# Patient Record
Sex: Female | Born: 1960 | Race: White | Hispanic: No | Marital: Married | Smoking: Never smoker
Health system: Southern US, Community
[De-identification: ages and names within clinical notes are randomized; demographics above are authoritative.]

## PROBLEM LIST (undated history)

## (undated) DIAGNOSIS — C4490 Unspecified malignant neoplasm of skin, unspecified: Secondary | ICD-10-CM

## (undated) DIAGNOSIS — Z87442 Personal history of urinary calculi: Secondary | ICD-10-CM

## (undated) DIAGNOSIS — B1910 Unspecified viral hepatitis B without hepatic coma: Secondary | ICD-10-CM

## (undated) DIAGNOSIS — G5603 Carpal tunnel syndrome, bilateral upper limbs: Secondary | ICD-10-CM

## (undated) DIAGNOSIS — E7800 Pure hypercholesterolemia, unspecified: Secondary | ICD-10-CM

## (undated) DIAGNOSIS — E042 Nontoxic multinodular goiter: Secondary | ICD-10-CM

## (undated) DIAGNOSIS — K219 Gastro-esophageal reflux disease without esophagitis: Secondary | ICD-10-CM

## (undated) DIAGNOSIS — I10 Essential (primary) hypertension: Secondary | ICD-10-CM

## (undated) DIAGNOSIS — K760 Fatty (change of) liver, not elsewhere classified: Secondary | ICD-10-CM

## (undated) DIAGNOSIS — E039 Hypothyroidism, unspecified: Secondary | ICD-10-CM

## (undated) DIAGNOSIS — R7303 Prediabetes: Secondary | ICD-10-CM

## (undated) DIAGNOSIS — Z9889 Other specified postprocedural states: Secondary | ICD-10-CM

## (undated) DIAGNOSIS — R112 Nausea with vomiting, unspecified: Secondary | ICD-10-CM

## (undated) HISTORY — PX: CARPAL TUNNEL RELEASE: SHX101

## (undated) HISTORY — PX: KNEE SURGERY: SHX244

## (undated) HISTORY — PX: TONSILLECTOMY: SUR1361

## (undated) HISTORY — PX: ABDOMINAL HYSTERECTOMY: SHX81

## (undated) HISTORY — PX: TUBAL LIGATION: SHX77

---

## 1997-12-06 ENCOUNTER — Ambulatory Visit (HOSPITAL_COMMUNITY): Admission: RE | Admit: 1997-12-06 | Discharge: 1997-12-06 | Payer: Self-pay | Admitting: *Deleted

## 1999-02-26 ENCOUNTER — Other Ambulatory Visit: Admission: RE | Admit: 1999-02-26 | Discharge: 1999-02-26 | Payer: Self-pay | Admitting: *Deleted

## 2000-06-30 ENCOUNTER — Other Ambulatory Visit: Admission: RE | Admit: 2000-06-30 | Discharge: 2000-06-30 | Payer: Self-pay | Admitting: *Deleted

## 2001-10-24 ENCOUNTER — Other Ambulatory Visit: Admission: RE | Admit: 2001-10-24 | Discharge: 2001-10-24 | Payer: Self-pay | Admitting: *Deleted

## 2002-11-01 ENCOUNTER — Other Ambulatory Visit: Admission: RE | Admit: 2002-11-01 | Discharge: 2002-11-01 | Payer: Self-pay | Admitting: *Deleted

## 2003-04-15 ENCOUNTER — Encounter: Admission: RE | Admit: 2003-04-15 | Discharge: 2003-04-15 | Payer: Self-pay | Admitting: Internal Medicine

## 2003-04-19 ENCOUNTER — Ambulatory Visit (HOSPITAL_COMMUNITY): Admission: RE | Admit: 2003-04-19 | Discharge: 2003-04-19 | Payer: Self-pay | Admitting: Internal Medicine

## 2004-01-09 ENCOUNTER — Other Ambulatory Visit: Admission: RE | Admit: 2004-01-09 | Discharge: 2004-01-09 | Payer: Self-pay | Admitting: *Deleted

## 2004-02-18 ENCOUNTER — Observation Stay (HOSPITAL_COMMUNITY): Admission: RE | Admit: 2004-02-18 | Discharge: 2004-02-19 | Payer: Self-pay | Admitting: *Deleted

## 2004-02-18 ENCOUNTER — Encounter (INDEPENDENT_AMBULATORY_CARE_PROVIDER_SITE_OTHER): Payer: Self-pay | Admitting: *Deleted

## 2005-01-28 ENCOUNTER — Other Ambulatory Visit: Admission: RE | Admit: 2005-01-28 | Discharge: 2005-01-28 | Payer: Self-pay | Admitting: *Deleted

## 2005-09-15 ENCOUNTER — Encounter: Admission: RE | Admit: 2005-09-15 | Discharge: 2005-09-15 | Payer: Self-pay | Admitting: Occupational Medicine

## 2006-05-04 ENCOUNTER — Other Ambulatory Visit: Admission: RE | Admit: 2006-05-04 | Discharge: 2006-05-04 | Payer: Self-pay | Admitting: *Deleted

## 2006-08-03 ENCOUNTER — Ambulatory Visit (HOSPITAL_BASED_OUTPATIENT_CLINIC_OR_DEPARTMENT_OTHER): Admission: RE | Admit: 2006-08-03 | Discharge: 2006-08-03 | Payer: Self-pay | Admitting: *Deleted

## 2006-08-17 ENCOUNTER — Ambulatory Visit (HOSPITAL_BASED_OUTPATIENT_CLINIC_OR_DEPARTMENT_OTHER): Admission: RE | Admit: 2006-08-17 | Discharge: 2006-08-17 | Payer: Self-pay | Admitting: *Deleted

## 2006-09-21 ENCOUNTER — Encounter (HOSPITAL_COMMUNITY): Admission: RE | Admit: 2006-09-21 | Discharge: 2006-10-21 | Payer: Self-pay | Admitting: *Deleted

## 2007-06-15 ENCOUNTER — Emergency Department (HOSPITAL_COMMUNITY): Admission: AC | Admit: 2007-06-15 | Discharge: 2007-06-15 | Payer: Self-pay

## 2007-12-25 ENCOUNTER — Other Ambulatory Visit: Admission: RE | Admit: 2007-12-25 | Discharge: 2007-12-25 | Payer: Self-pay | Admitting: Gynecology

## 2007-12-25 ENCOUNTER — Ambulatory Visit: Payer: Self-pay | Admitting: Women's Health

## 2007-12-25 ENCOUNTER — Encounter: Payer: Self-pay | Admitting: Women's Health

## 2008-01-24 ENCOUNTER — Ambulatory Visit (HOSPITAL_BASED_OUTPATIENT_CLINIC_OR_DEPARTMENT_OTHER): Admission: RE | Admit: 2008-01-24 | Discharge: 2008-01-24 | Payer: Self-pay | Admitting: *Deleted

## 2009-06-18 ENCOUNTER — Ambulatory Visit (HOSPITAL_COMMUNITY): Admission: RE | Admit: 2009-06-18 | Discharge: 2009-06-18 | Payer: Self-pay | Admitting: Orthopaedic Surgery

## 2009-07-01 ENCOUNTER — Ambulatory Visit (HOSPITAL_COMMUNITY): Admission: RE | Admit: 2009-07-01 | Discharge: 2009-07-01 | Payer: Self-pay | Admitting: Orthopaedic Surgery

## 2009-09-15 ENCOUNTER — Ambulatory Visit (HOSPITAL_COMMUNITY): Admission: RE | Admit: 2009-09-15 | Discharge: 2009-09-15 | Payer: Self-pay | Admitting: Orthopaedic Surgery

## 2009-10-03 ENCOUNTER — Ambulatory Visit (HOSPITAL_COMMUNITY): Admission: RE | Admit: 2009-10-03 | Discharge: 2009-10-03 | Payer: Self-pay | Admitting: Orthopaedic Surgery

## 2010-05-08 LAB — BASIC METABOLIC PANEL
BUN: 17 mg/dL (ref 6–23)
CO2: 27 mEq/L (ref 19–32)
Calcium: 9.5 mg/dL (ref 8.4–10.5)
Creatinine, Ser: 0.75 mg/dL (ref 0.4–1.2)
Glucose, Bld: 90 mg/dL (ref 70–99)
Sodium: 138 mEq/L (ref 135–145)

## 2010-05-08 LAB — SURGICAL PCR SCREEN: Staphylococcus aureus: NEGATIVE

## 2010-05-12 LAB — PROTIME-INR: INR: 0.96 (ref 0.00–1.49)

## 2010-06-12 ENCOUNTER — Institutional Professional Consult (permissible substitution): Payer: Self-pay | Admitting: Internal Medicine

## 2010-07-07 NOTE — Op Note (Signed)
NAME:  Norma Sanchez, Norma Sanchez NO.:  [id number]   MEDICAL RECORD NO.:  [id number]          PATIENT TYPE:  AMB   LOCATION:  DSC                          FACILITY:  [facility]   PHYSICIAN:  Tennis Must Meyerdierks, M.D.DATE OF BIRTH:  1960/03/25   DATE OF PROCEDURE:  01/24/2008  DATE OF DISCHARGE:                               OPERATIVE REPORT   PREOPERATIVE DIAGNOSIS:  Left trigger thumb.   POSTOPERATIVE DIAGNOSIS:  Left trigger thumb.   PROCEDURE:  Release of A1 pulley, left thumb.   SURGEON:  Lowell Bouton, MD.   ANESTHESIA:  Marcaine 0.5% local with sedation.   OPERATIVE FINDINGS:  The patient had thickening of the FPL tendon  beneath the A1 pulley.  There was some mild shredding of the tendon.   PROCEDURE:  Under 0.5% Marcaine local anesthesia with a tourniquet on  the left arm, the left hand was prepped and draped in usual fashion.  After exsanguinating the limb, the tourniquet was inflated to 250 mmHg.  A transverse incision was made over the volar aspect of the MP joint of  the left thumb and carried down through the subcutaneous tissues.  Blunt  dissection was carried down to the tendon sheath and the A1 pulley was  incised.  It was released completely with a scissors and there was some  shredding and thickening of the tendon beneath it.  After releasing the  pulley, the thumb had good range of motion without any further  triggering.  The wound was irrigated with saline.  The skin was closed  with 4-0 nylon sutures.  Sterile dressings were applied.  The tourniquet  was released with good circulation of the hand.  The patient went to the  recovery room, awake in stable in good condition.      Lowell Bouton, M.D.  Electronically Signed     EMM/MEDQ  D:  01/24/2008  T:  01/24/2008  Job:  [id number]

## 2010-07-07 NOTE — Op Note (Signed)
NAME:  Norma Sanchez, Norma Sanchez NO.:  [id number]   MEDICAL RECORD NO.:  [id number]          PATIENT TYPE:  AMB   LOCATION:  DSC                          FACILITY:  [facility]   PHYSICIAN:  Tennis Must Meyerdierks, M.D.DATE OF BIRTH:  10/28/1960   DATE OF PROCEDURE:  08/17/2006  DATE OF DISCHARGE:                               OPERATIVE REPORT   PREOPERATIVE DIAGNOSIS:  Left carpal tunnel syndrome.   POSTOPERATIVE DIAGNOSIS:  Left carpal tunnel syndrome.   PROCEDURE:  Decompression median nerve left carpal tunnel.   SURGEON:  Lowell Bouton, M.D.   ANESTHESIA:  0.5% Marcaine local with sedation.   OPERATIVE FINDINGS:  The patient had no masses in the carpal canal.  The  motor branch of the nerve was intact.  There did appear to be  significant compression on the median nerve.   DESCRIPTION OF PROCEDURE:  Under 0.5% Marcaine local anesthesia with a  tourniquet on the left arm, the left hand was prepped and draped in the  usual fashion. After exsanguinating the limb, the tourniquet was  inflated to 250 mmHg.  A 3 cm longitudinal incision was made in the palm  just ulnar to the thenar crease and carried through the subcutaneous  tissues.  Blunt dissection was carried through the superficial palmar  fascia distal to the transverse carpal ligament.  A hemostat was then  placed in the carpal canal up against the hook of hamate and the  transverse carpal ligament was divided on the ulnar border of the median  nerve.  The proximal end of the ligament was divided with the scissors  after dissecting the nerve away from the under surface of the ligament.  The carpal canal was then palpated and was found to be adequately  decompressed. The nerve was examined and the motor branch identified.  The wound was irrigated with saline.  The skin was closed with 4-0 nylon  sutures.  Sterile dressings were applied followed by a volar wrist  splint.  The sutures from the previous  right carpal tunnel release were  then removed prior to sending the patient to the recovery room awake and  stable in good condition.      Lowell Bouton, M.D.  Electronically Signed     EMM/MEDQ  D:  08/17/2006  T:  08/17/2006  Job:  [id number]

## 2010-07-07 NOTE — Op Note (Signed)
NAME:  Norma Sanchez, Norma Sanchez NO.:  [id number]   MEDICAL RECORD NO.:  [id number]          PATIENT TYPE:  AMB   LOCATION:  DSC                          FACILITY:  [facility]   PHYSICIAN:  Tennis Must Meyerdierks, M.D.DATE OF BIRTH:  1960/05/25   DATE OF PROCEDURE:  08/03/2006  DATE OF DISCHARGE:                               OPERATIVE REPORT   PREOPERATIVE DIAGNOSIS:  Right carpal tunnel syndrome.   POSTOPERATIVE DIAGNOSIS:  Right carpal tunnel syndrome.   PROCEDURE:  Decompression median nerve, right carpal tunnel.   SURGEON:  Lowell Bouton, M.D.   ANESTHESIA:  0.5% Marcaine with local with sedation.   OPERATIVE FINDINGS:  The patient had a very tight carpal canal.  The  nerve had significant pressure on it.  The motor branch was intact and I  could not identify any masses in the carpal tunnel.   PROCEDURE:  Under 5-0 Monocryl local anesthesia with a tourniquet on the  right arm, the right hand was prepped and draped in usual fashion.  After exsanguinating the limb, the tourniquet was inflated to 250 mmHg.  A 3 cm longitudinal incision was made in the palm just ulnar to the  thenar crease and carried through the subcutaneous tissues.  Blunt  dissection was carried distal to the transverse carpal ligament and a  hemostat was placed in the carpal canal up against the hook of the  hamate.  The transverse carpal ligament was then divided on the ulnar  border of the median nerve.  The proximal end of the ligament was  divided with scissors after dissecting the nerve away from the  undersurface of the ligament.  The carpal canal was then palpated and  was found to be adequately decompressed the motor branch of the nerve  was identified the wound was irrigated with saline.  The skin was closed  with 4-0 nylon sutures.  Sterile dressings were applied followed by a  volar wrist splint.  The patient tolerated the procedure well and went  to the recovery room awake in  stable and good condition.      Lowell Bouton, M.D.  Electronically Signed     EMM/MEDQ  D:  08/03/2006  T:  08/03/2006  Job:  [id number]

## 2010-07-10 NOTE — H&P (Signed)
NAME:  Norma Sanchez, Norma Sanchez                 ACCOUNT NO.:  [id number]   MEDICAL RECORD NO.:  [id number]          PATIENT TYPE:  INP   LOCATION:  NA                            [facility]   PHYSICIAN:  Almedia Balls. Fore, M.D.   DATE OF BIRTH:  [birth year]   DATE OF ADMISSION:  02/18/2004  DATE OF DISCHARGE:                                HISTORY & PHYSICAL   CHIEF COMPLAINT:  Abnormal bleeding, pain.   HISTORY:  The patient is a 50 year old with increasingly severe menses to  the point of missing work because of the pain and the heavy periods.  She  states that she has to change a super protective device at least every hour  and passes large clots.  She underwent saline sonogram on January 14, 2004,  at which time showed an enlarged uterus with increased endometrial stripe.  Biopsy of this tissue was performed and showed benign changes.  Because of  the persistence of her problem and the increasingly severe periods, she has  opted to proceed with definitive therapy, which would be abdominal  hysterectomy with possible bilateral salpingo-oophorectomy.  This procedure  has been fully discussed with her to include the procedure itself and the  risks involved to include risks of anesthesia, injury to bowel, bladder,  blood vessels, ureters, postoperative hemorrhage, infection, recuperation,  use of hormone replacement should her ovaries be removed.  She fully  understands all these considerations and wishes to proceed on February 18, 2004.   PAST MEDICAL HISTORY:  Laparoscopy in 1999, at which time ovarian cysts were  removed and Falope rings were placed for sterilization attempt, and a  hysteroscopy, D&C and laparoscopy in 2003 with benign findings at that point  but again ovarian cysts.  Pap smear was normal in November 2005.  The  patient has been followed for hypertension and hypercholesterolemia and  takes:   1.  Amiloride HCl 5 mg per day.  2.  Lipitor 10 mg per day.  3.  Atacand 16 mg  per day.  4.  Propranolol 80 mg twice a day.   FAMILY HISTORY:  Mother with heart disease and breast cancer with  mastectomy.   The patient is allergic to CODEINE, AMOXICILLIN, DOXYCYCLINE, sensitive to  hydrochlorothiazide.   REVIEW OF SYSTEMS:  HEENT:  Negative.  CARDIORESPIRATORY:  Negative except  as noted above.  GASTROINTESTINAL:  Negative.  GENITOURINARY:  As noted  above.  NEUROMUSCULAR:  Negative.   PHYSICAL EXAMINATION:  VITAL SIGNS:  Height 5 feet 3/4 inches, weight 226  pounds, blood pressure 120/78, pulse 84, respirations 18.  GENERAL:  A well-developed white female in no acute distress.  HEENT:  Within normal limits.  NECK:  Supple without masses, adenopathy, or bruits.  CARDIAC:  Regular rate and rhythm without murmurs.  CHEST:  Lungs clear to P&A.  BREASTS:  Breast exam sitting and lying without masses.  Axillae negative.  ABDOMEN:  Soft without mass, nontender.  The patient does have an increased  panniculus.  Costovertebral angle nontender.  PELVIC:  External genitalia, Bartholin's, urethra and  Skene's glands within  normal limits.  Vagina is clean.  Cervix is slightly inflamed.  Uterus is  midposition and enlarged to approximately eight to 10 weeks' gestational  size.  It is tender on palpation.  There are no palpable adnexal masses and  no tenderness in this area.  The anterior and posterior cul-de-sac  examination is confirmatory.  EXTREMITIES:  Within normal limits.  CENTRAL NERVOUS SYSTEM:  Grossly intact.  SKIN:  Without suspicious lesions.   IMPRESSION:  Abnormal uterine bleeding, pelvic pain, rule out  adenomyosis/endometriosis.   DISPOSITION:  As noted above.      SRF/MEDQ  D:  02/12/2004  T:  02/12/2004  Job:  [id number]

## 2010-07-10 NOTE — Discharge Summary (Signed)
NAME:  Norma Sanchez, Norma Sanchez                 ACCOUNT NO.:  [id number]   MEDICAL RECORD NO.:  [id number]          PATIENT TYPE:  OBV   LOCATION:  9315                          FACILITY:  [facility]   PHYSICIAN:  Almedia Balls. Fore, M.D.   DATE OF BIRTH:  04-26-1960   DATE OF ADMISSION:  02/18/2004  DATE OF DISCHARGE:  02/19/2004                                 DISCHARGE SUMMARY   HISTORY OF PRESENT ILLNESS:  The patient is a 50 year old with abnormal  uterine bleeding, pelvic pain for hysterectomy and possible bilateral  salpingo-oophorectomy on February 18, 2004.  The remainder of her history  and physical are as previously dictated.   LABORATORY DATA:  Preoperative hemoglobin 14.4, 12,000 white blood cells,  with normal differential.  BMET panel was within normal limits.   Chest x-ray in February of 2005 was normal.   HOSPITAL COURSE:  The patient was taken to the operating room on February 18, 2004, at which time abdominal supracervical hysterectomy, left salpingo-  oophorectomy, extensive enterolysis were performed.  The patient did well  postoperatively.  Diet and ambulation were progressed over the evening of  December 27, and early morning of February 19, 2004.  On the morning of  December 28, she was afebrile and experiencing no problems except for pain  which was controlled with oral analgesics.  It was felt that she could be  discharged at this time.   DISCHARGE DIAGNOSES:  1.  Abnormal uterine bleeding.  2.  Pelvic pain.  3.  Extensive abdominal and pelvic adhesions.   PROCEDURE:  Abdominal supracervical hysterectomy, left salpingo-  oophorectomy, extensive enterolysis.  Pathology report unavailable at the  time of dictation.   DISPOSITION:  Discharged home to return to the office in two weeks for  follow-up.  She was instructed to gradually progress her activity over  several weeks at home and to limit lifting and driving for two weeks.  She  was fully ambulatory, on a regular diet,  and in good condition at the time  of discharge.  She was given prescriptions for [facility] #30 to be  taken one or two q.6-8h p.r.n. pain and Cipro 500 mg #10 to be taken one  b.i.d. x48 hours and then 1/2 b.i.d.      SRF/MEDQ  D:  02/19/2004  T:  02/19/2004  Job:  [id number]

## 2010-07-10 NOTE — Op Note (Signed)
NAME:  Norma Sanchez, Norma Sanchez                 ACCOUNT NO.:  [id number]   MEDICAL RECORD NO.:  [id number]          PATIENT TYPE:  OBV   LOCATION:  9399                          [facility]ACILITY:  [facility]   PHYSICIAN:  Almedia Balls. Fore, M.D.   DATE OF BIRTH:  21-Jun-1960   DATE OF PROCEDURE:  02/18/2004  DATE OF DISCHARGE:                                 OPERATIVE REPORT   PREOPERATIVE DIAGNOSIS:  Abnormal uterine bleeding, pelvic pain.   POSTOPERATIVE DIAGNOSIS:  Abnormal uterine bleeding, pelvic pain, pending  pathology.   OPERATION:  1.  Abdominal cervical hysterectomy.  2.  Left salpingo-oophorectomy.  3.  Extensive enterolysis.   ANESTHESIA:  General orotracheal.   OPERATOR:  Jeanine Luz, M.D.   FIRST ASSISTANT:  Leona Singleton, M.D.   INDICATION FOR SURGERY:  Patient is a 50 year old with the above-noted  problems who has been counseled as to the need for surgery and the type of  surgery to be performed as well as the risks involved to include risks of  anesthesia, injury to bowel, bladder, blood vessels, ureters, postoperative  hemorrhage, infection, recuperation, possible hormone replacement should her  ovaries be removed.  She fully understands all of these considerations and  wishes to proceed on February 18, 2004, and has signed informed consent to  proceed on this date as well.   OPERATIVE FINDINGS:  On entry into the abdomen it was noted that there were  adhesions involving loops of bowel to the lateral peritoneal surface and the  anterior peritoneal surface as well as well as to each other.  It was  necessary to lyse these adhesions, which required 10-15 minutes of operative  time and great care to effect enterolysis.  The upper abdominal viscera were  normal to palpation.  The uterus was mid posterior and top normal size and  somewhat soft.  Right ovary had a small cyst present which was simple.  Left  ovary was adherent to the posterolateral peritoneal area.   PROCEDURE:  With the  patient under general anesthesia, prepared and draped  in the usual sterile fashion, with a Foley catheter in the bladder, a lower  abdominal transverse incision was made and carried into the peritoneal  cavity.  A number of bleeders were rendered hemostatic using Bovie  electrocoagulation.  A self-retaining retractor was placed, and the bowel  was packed off.  Kelly clamps were used to clamp the uterine ovarian  anastomoses, tubes and round ligaments bilaterally for traction and  hemostasis.  Lysis of adhesions involving the left tube and ovary was then  necessary to free up this area as well.  Both round ligaments were then  secured, ligated with one chromic catgut, and transected for entry into the  retroperitoneal space and development of the bladder flap anteriorly.  This  was accomplished without difficulty.  Any clamps were then placed across the  uterine ovarian anastomoses and tubes bilaterally; these structures were  then cut free and doubly ligated with one chromic catgut.  Uterine vessels  bilaterally were then skeletonized, clamped, cut, and suture ligated with 1  chromic catgut.  Cardinal ligaments bilaterally were then clamped, cut, and  suture ligated with 1 chromic catgut.  It was then possible to excise the  uterine fundus from the cervix using Bovie electrocoagulation to core out  the endocervix.  Endocervix was further electrocoagulated to prevent  leukorrhea and postoperative bleeding.  The cervix was then reapproximated  and rendered hemostatic with interrupted figure-of-eight sutures of 1  chromic catgut.  Further visualization of the left tube and ovary revealed a  number of adhesions and areas which were suspicious for endometriosis.  It  was felt, therefore, that the left ovary should be removed.  Accordingly,  the left infundibulopelvic ligament was isolated, clamped, cut, and doubly  ligated with 1 chromic catgut.  The area was then lavaged with copious  amounts  of lactated Ringer's solution, and after noting that hemostasis was  maintained, the surgical area overlying the cervix reperitonealized with  interrupted suture of 1 chromic catgut.  At this point, with good hemostasis  and a correct sponge and instrument count, the peritoneum was closed with a  continuous suture of 0 Vicryl.  The fascia was closed with two sutures of 0  Vicryl which were brought from the lateral aspects of the incision and tied  separately in the midline.  Subcutaneous fat was reapproximated with  interrupted sutures of #1 chromic catgut.  Skin was closed with a  subcuticular suture of 3-0 plain catgut.   ESTIMATED BLOOD LOSS:  200 mL.   Patient was taken to the recovery room in good condition with clear urine  and Foley catheter tubing.  She will be placed on 23 hour observation  following surgery.      SRF/MEDQ  D:  02/18/2004  T:  02/18/2004  Job:  [id number]   cc:   Leona Singleton, M.D.  [address]  [place]  [place] [address]  Fax: [phone]

## 2010-10-29 ENCOUNTER — Other Ambulatory Visit: Payer: Self-pay | Admitting: Internal Medicine

## 2010-10-29 ENCOUNTER — Other Ambulatory Visit (HOSPITAL_COMMUNITY)
Admission: RE | Admit: 2010-10-29 | Discharge: 2010-10-29 | Disposition: A | Discharge status: Home / Self Care | Payer: 59 | Source: Ambulatory Visit | Attending: Internal Medicine | Admitting: Internal Medicine

## 2010-10-29 DIAGNOSIS — Z01419 Encounter for gynecological examination (general) (routine) without abnormal findings: Secondary | ICD-10-CM | POA: Insufficient documentation

## 2010-10-29 LAB — HM PAP SMEAR: HM Pap smear: NORMAL

## 2010-10-31 ENCOUNTER — Inpatient Hospital Stay (INDEPENDENT_AMBULATORY_CARE_PROVIDER_SITE_OTHER)
Admission: RE | Admit: 2010-10-31 | Discharge: 2010-10-31 | Disposition: A | Discharge status: Home / Self Care | Payer: 59 | Source: Ambulatory Visit | Attending: Family Medicine | Admitting: Family Medicine

## 2010-10-31 DIAGNOSIS — L989 Disorder of the skin and subcutaneous tissue, unspecified: Secondary | ICD-10-CM

## 2010-11-17 LAB — CBC
HCT: 39.7
Platelets: 318
RBC: 4.57
WBC: 12.7 — ABNORMAL HIGH

## 2010-11-17 LAB — POCT I-STAT, CHEM 8
BUN: 13
Creatinine, Ser: 0.9
Potassium: 4.2
Sodium: 139

## 2010-11-17 LAB — DIFFERENTIAL
Eosinophils Relative: 3
Lymphocytes Relative: 31
Lymphs Abs: 4
Neutrophils Relative %: 59

## 2010-11-24 LAB — BASIC METABOLIC PANEL
CO2: 25 mEq/L (ref 19–32)
Glucose, Bld: 86 mg/dL (ref 70–99)
Potassium: 4.2 mEq/L (ref 3.5–5.1)
Sodium: 139 mEq/L (ref 135–145)

## 2010-12-09 LAB — BASIC METABOLIC PANEL
CO2: 27
Glucose, Bld: 117 — ABNORMAL HIGH
Potassium: 4.8
Sodium: 139

## 2010-12-10 LAB — BASIC METABOLIC PANEL
CO2: 27
Calcium: 9.5
Chloride: 105
GFR calc Af Amer: >60
Sodium: 138

## 2010-12-10 LAB — POCT HEMOGLOBIN-HEMACUE: Hemoglobin: 13.9

## 2011-01-12 ENCOUNTER — Ambulatory Visit (HOSPITAL_COMMUNITY)
Admission: RE | Admit: 2011-01-12 | Discharge: 2011-01-12 | Disposition: A | Discharge status: Home / Self Care | Payer: 59 | Source: Ambulatory Visit | Attending: Orthopaedic Surgery | Admitting: Orthopaedic Surgery

## 2011-01-12 ENCOUNTER — Other Ambulatory Visit (HOSPITAL_COMMUNITY): Payer: Self-pay | Admitting: Orthopaedic Surgery

## 2011-01-12 DIAGNOSIS — M1710 Unilateral primary osteoarthritis, unspecified knee: Secondary | ICD-10-CM | POA: Insufficient documentation

## 2011-01-12 DIAGNOSIS — M25469 Effusion, unspecified knee: Secondary | ICD-10-CM | POA: Insufficient documentation

## 2011-01-12 DIAGNOSIS — M25569 Pain in unspecified knee: Secondary | ICD-10-CM

## 2011-01-12 DIAGNOSIS — X58XXXA Exposure to other specified factors, initial encounter: Secondary | ICD-10-CM | POA: Insufficient documentation

## 2011-01-12 DIAGNOSIS — M7120 Synovial cyst of popliteal space [Baker], unspecified knee: Secondary | ICD-10-CM | POA: Insufficient documentation

## 2011-01-12 DIAGNOSIS — IMO0002 Reserved for concepts with insufficient information to code with codable children: Secondary | ICD-10-CM | POA: Insufficient documentation

## 2011-01-19 ENCOUNTER — Other Ambulatory Visit (HOSPITAL_COMMUNITY): Payer: 59

## 2011-03-24 ENCOUNTER — Other Ambulatory Visit: Payer: Self-pay | Admitting: Internal Medicine

## 2011-03-24 DIAGNOSIS — R7989 Other specified abnormal findings of blood chemistry: Secondary | ICD-10-CM

## 2011-03-26 ENCOUNTER — Ambulatory Visit
Admission: RE | Admit: 2011-03-26 | Discharge: 2011-03-26 | Disposition: A | Discharge status: Home / Self Care | Payer: 59 | Source: Ambulatory Visit | Attending: Internal Medicine | Admitting: Internal Medicine

## 2011-03-26 DIAGNOSIS — R7989 Other specified abnormal findings of blood chemistry: Secondary | ICD-10-CM

## 2011-03-31 ENCOUNTER — Other Ambulatory Visit: Payer: Self-pay | Admitting: Emergency Medicine

## 2011-03-31 ENCOUNTER — Other Ambulatory Visit: Payer: Self-pay | Admitting: Internal Medicine

## 2011-03-31 DIAGNOSIS — E041 Nontoxic single thyroid nodule: Secondary | ICD-10-CM

## 2011-04-06 ENCOUNTER — Ambulatory Visit
Admission: RE | Admit: 2011-04-06 | Discharge: 2011-04-06 | Disposition: A | Discharge status: Home / Self Care | Payer: 59 | Source: Ambulatory Visit | Attending: Internal Medicine | Admitting: Internal Medicine

## 2011-04-06 ENCOUNTER — Other Ambulatory Visit (HOSPITAL_COMMUNITY)
Admission: RE | Admit: 2011-04-06 | Discharge: 2011-04-06 | Disposition: A | Discharge status: Home / Self Care | Payer: 59 | Source: Ambulatory Visit | Attending: Interventional Radiology | Admitting: Interventional Radiology

## 2011-04-06 DIAGNOSIS — E041 Nontoxic single thyroid nodule: Secondary | ICD-10-CM | POA: Insufficient documentation

## 2011-04-12 ENCOUNTER — Other Ambulatory Visit (HOSPITAL_COMMUNITY): Payer: Self-pay | Admitting: Internal Medicine

## 2011-04-12 DIAGNOSIS — E0590 Thyrotoxicosis, unspecified without thyrotoxic crisis or storm: Secondary | ICD-10-CM

## 2011-04-21 ENCOUNTER — Encounter (HOSPITAL_COMMUNITY)
Admission: RE | Admit: 2011-04-21 | Discharge: 2011-04-21 | Disposition: A | Discharge status: Home / Self Care | Payer: 59 | Source: Ambulatory Visit | Attending: Internal Medicine | Admitting: Internal Medicine

## 2011-04-21 DIAGNOSIS — E0590 Thyrotoxicosis, unspecified without thyrotoxic crisis or storm: Secondary | ICD-10-CM

## 2011-04-21 DIAGNOSIS — E0520 Thyrotoxicosis with toxic multinodular goiter without thyrotoxic crisis or storm: Secondary | ICD-10-CM | POA: Insufficient documentation

## 2011-04-22 ENCOUNTER — Encounter (HOSPITAL_COMMUNITY)
Admission: RE | Admit: 2011-04-22 | Discharge: 2011-04-22 | Disposition: A | Discharge status: Home / Self Care | Payer: 59 | Source: Ambulatory Visit | Attending: Internal Medicine | Admitting: Internal Medicine

## 2011-04-22 MED ORDER — SODIUM PERTECHNETATE TC 99M INJECTION
10.3000 | Freq: Once | INTRAVENOUS | Status: AC | PRN
  Administered 2011-04-22: 10.3 via INTRAVENOUS

## 2011-04-22 MED ORDER — SODIUM IODIDE I 131 CAPSULE
12.9000 | Freq: Once | INTRAVENOUS | Status: AC | PRN
  Administered 2011-04-21: 12.9 via ORAL

## 2011-04-23 HISTORY — PX: OTHER SURGICAL HISTORY: SHX169

## 2011-04-26 ENCOUNTER — Ambulatory Visit (HOSPITAL_COMMUNITY): Payer: 59

## 2011-04-27 ENCOUNTER — Other Ambulatory Visit (HOSPITAL_COMMUNITY): Payer: 59

## 2011-04-27 ENCOUNTER — Other Ambulatory Visit (HOSPITAL_COMMUNITY): Payer: Self-pay | Admitting: Internal Medicine

## 2011-04-27 DIAGNOSIS — E042 Nontoxic multinodular goiter: Secondary | ICD-10-CM

## 2011-05-06 ENCOUNTER — Encounter (HOSPITAL_COMMUNITY)
Admission: RE | Admit: 2011-05-06 | Discharge: 2011-05-06 | Disposition: A | Discharge status: Home / Self Care | Payer: 59 | Source: Ambulatory Visit | Attending: Internal Medicine | Admitting: Internal Medicine

## 2011-05-06 ENCOUNTER — Encounter (HOSPITAL_COMMUNITY): Payer: Self-pay

## 2011-05-06 DIAGNOSIS — E042 Nontoxic multinodular goiter: Secondary | ICD-10-CM

## 2011-05-06 DIAGNOSIS — E0520 Thyrotoxicosis with toxic multinodular goiter without thyrotoxic crisis or storm: Secondary | ICD-10-CM | POA: Insufficient documentation

## 2011-05-06 HISTORY — DX: Nontoxic multinodular goiter: E04.2

## 2011-05-06 MED ORDER — SODIUM IODIDE I 131 CAPSULE
25.8000 | Freq: Once | INTRAVENOUS | Status: AC | PRN
  Administered 2011-05-06: 25.8 via ORAL

## 2011-06-10 ENCOUNTER — Ambulatory Visit: Payer: 59 | Attending: Orthopaedic Surgery

## 2011-06-10 DIAGNOSIS — R262 Difficulty in walking, not elsewhere classified: Secondary | ICD-10-CM | POA: Insufficient documentation

## 2011-06-10 DIAGNOSIS — IMO0001 Reserved for inherently not codable concepts without codable children: Secondary | ICD-10-CM | POA: Insufficient documentation

## 2011-06-10 DIAGNOSIS — R279 Unspecified lack of coordination: Secondary | ICD-10-CM | POA: Insufficient documentation

## 2011-06-10 DIAGNOSIS — M25569 Pain in unspecified knee: Secondary | ICD-10-CM | POA: Insufficient documentation

## 2011-06-14 ENCOUNTER — Ambulatory Visit: Payer: 59 | Admitting: Rehabilitation

## 2011-06-15 ENCOUNTER — Ambulatory Visit: Payer: 59 | Admitting: Physical Therapy

## 2011-06-21 ENCOUNTER — Ambulatory Visit: Payer: 59 | Admitting: Physical Therapy

## 2011-06-23 ENCOUNTER — Ambulatory Visit: Payer: 59 | Attending: Orthopaedic Surgery | Admitting: Physical Therapy

## 2011-06-23 DIAGNOSIS — M25569 Pain in unspecified knee: Secondary | ICD-10-CM | POA: Insufficient documentation

## 2011-06-23 DIAGNOSIS — R262 Difficulty in walking, not elsewhere classified: Secondary | ICD-10-CM | POA: Insufficient documentation

## 2011-06-23 DIAGNOSIS — M25669 Stiffness of unspecified knee, not elsewhere classified: Secondary | ICD-10-CM | POA: Insufficient documentation

## 2011-06-23 DIAGNOSIS — IMO0001 Reserved for inherently not codable concepts without codable children: Secondary | ICD-10-CM | POA: Insufficient documentation

## 2011-06-28 ENCOUNTER — Ambulatory Visit: Payer: 59 | Admitting: Physical Therapy

## 2011-07-09 ENCOUNTER — Ambulatory Visit (INDEPENDENT_AMBULATORY_CARE_PROVIDER_SITE_OTHER): Payer: 59 | Admitting: Family Medicine

## 2011-07-09 VITALS — BP 153/84 | Ht 61.0 in | Wt 218.0 lb

## 2011-07-09 DIAGNOSIS — M2170 Unequal limb length (acquired), unspecified site: Secondary | ICD-10-CM

## 2011-07-09 DIAGNOSIS — R269 Unspecified abnormalities of gait and mobility: Secondary | ICD-10-CM

## 2011-07-20 NOTE — Progress Notes (Signed)
Patient ID: Norma Sanchez, female   DOB: [birth year], 51 y.o.   MRN: [id number] Patient here for orthotics Patient was fitted for a : standard, cushioned, semi-rigid orthotic. The orthotic was heated, placed on the orthotic stand. The patient was positioned in subtalar neutral position and 10 degrees of ankle dorsiflexion in a weight bearing stance on the heated orthotic blank After completion of molding, a stable base was applied to the orthotic blank. The blank was ground to a stable position for weight bearing. Blank: red Base:white  Posting:medial    Face to face time spent in evaluation, measurement and manufacture of custom molded orthotic was 40 minutes.

## 2011-07-23 ENCOUNTER — Ambulatory Visit (INDEPENDENT_AMBULATORY_CARE_PROVIDER_SITE_OTHER): Payer: 59 | Admitting: Family Medicine

## 2011-07-23 VITALS — BP 147/84

## 2011-07-23 DIAGNOSIS — E669 Obesity, unspecified: Secondary | ICD-10-CM

## 2011-07-23 DIAGNOSIS — M7741 Metatarsalgia, right foot: Secondary | ICD-10-CM

## 2011-07-23 DIAGNOSIS — M79672 Pain in left foot: Secondary | ICD-10-CM | POA: Insufficient documentation

## 2011-07-23 DIAGNOSIS — M79604 Pain in right leg: Secondary | ICD-10-CM

## 2011-07-23 DIAGNOSIS — M79609 Pain in unspecified limb: Secondary | ICD-10-CM

## 2011-07-23 DIAGNOSIS — M7750 Other enthesopathy of unspecified foot: Secondary | ICD-10-CM

## 2011-07-23 NOTE — Progress Notes (Signed)
  Subjective:    Patient ID: Norma Sanchez, female    DOB: 09-Sep-1960, 51 y.o.   MRN: [id number]  HPI Followup foot pain. We had pacer in some orthotics. Unfortunately these were not able to be fitted into her existing shoes. She's using them and some different shoes with some improvement. She is noticing a small area of the right third and fourth metatarsal area that is tender to palpation.   Review of Systems Denies numbness or tingling in the feet    Objective:   Physical Exam  GENERAL: Overweight. No acute distress SIGNIFICANT pes planus with medial foot collapse bilaterally. The transverse arch is totally collapsed on both sides. Area of tenderness is in the fourth metatarsal head distally.      Assessment & Plan:  #1. Significant bilateral foot pain. We'll try to reproduce her orthotics in something that will fit into her daytime shoes. I put a pad under her existing orthotics.

## 2011-11-12 ENCOUNTER — Other Ambulatory Visit: Payer: Self-pay | Admitting: Internal Medicine

## 2011-11-12 DIAGNOSIS — R1011 Right upper quadrant pain: Secondary | ICD-10-CM

## 2011-11-15 ENCOUNTER — Encounter (HOSPITAL_COMMUNITY): Payer: Self-pay

## 2011-11-15 ENCOUNTER — Emergency Department (HOSPITAL_COMMUNITY)
Admission: EM | Admit: 2011-11-15 | Discharge: 2011-11-15 | Disposition: A | Discharge status: Home / Self Care | Payer: 59 | Attending: Emergency Medicine | Admitting: Emergency Medicine

## 2011-11-15 ENCOUNTER — Emergency Department (HOSPITAL_COMMUNITY): Payer: 59

## 2011-11-15 ENCOUNTER — Other Ambulatory Visit: Payer: 59

## 2011-11-15 DIAGNOSIS — K5900 Constipation, unspecified: Secondary | ICD-10-CM | POA: Insufficient documentation

## 2011-11-15 DIAGNOSIS — R1011 Right upper quadrant pain: Secondary | ICD-10-CM | POA: Insufficient documentation

## 2011-11-15 DIAGNOSIS — Z882 Allergy status to sulfonamides status: Secondary | ICD-10-CM | POA: Insufficient documentation

## 2011-11-15 DIAGNOSIS — R110 Nausea: Secondary | ICD-10-CM | POA: Insufficient documentation

## 2011-11-15 DIAGNOSIS — I10 Essential (primary) hypertension: Secondary | ICD-10-CM | POA: Insufficient documentation

## 2011-11-15 DIAGNOSIS — Z888 Allergy status to other drugs, medicaments and biological substances status: Secondary | ICD-10-CM | POA: Insufficient documentation

## 2011-11-15 DIAGNOSIS — Z885 Allergy status to narcotic agent status: Secondary | ICD-10-CM | POA: Insufficient documentation

## 2011-11-15 HISTORY — DX: Essential (primary) hypertension: I10

## 2011-11-15 HISTORY — DX: Pure hypercholesterolemia, unspecified: E78.00

## 2011-11-15 LAB — CBC WITH DIFFERENTIAL/PLATELET
Hemoglobin: 15.1 g/dL — ABNORMAL HIGH (ref 12.0–15.0)
Lymphocytes Relative: 26 % (ref 12–46)
Lymphs Abs: 2.5 10*3/uL (ref 0.7–4.0)
MCV: 86.4 fL (ref 78.0–100.0)
Monocytes Relative: 7 % (ref 3–12)
Neutrophils Relative %: 62 % (ref 43–77)
Platelets: 299 10*3/uL (ref 150–400)
RBC: 5.01 MIL/uL (ref 3.87–5.11)
WBC: 9.4 10*3/uL (ref 4.0–10.5)

## 2011-11-15 LAB — LIPASE, BLOOD: Lipase: 27 U/L (ref 11–59)

## 2011-11-15 LAB — COMPREHENSIVE METABOLIC PANEL
AST: 22 U/L (ref 0–37)
Albumin: 4.4 g/dL (ref 3.5–5.2)
CO2: 24 mEq/L (ref 19–32)
Glucose, Bld: 99 mg/dL (ref 70–99)
Potassium: 4.1 mEq/L (ref 3.5–5.1)
Sodium: 138 mEq/L (ref 135–145)

## 2011-11-15 LAB — URINALYSIS, ROUTINE W REFLEX MICROSCOPIC
Glucose, UA: NEGATIVE mg/dL
Hgb urine dipstick: NEGATIVE
Specific Gravity, Urine: 1.026 (ref 1.005–1.030)
pH: 5.5 (ref 5.0–8.0)

## 2011-11-15 LAB — URINE MICROSCOPIC-ADD ON

## 2011-11-15 MED ORDER — ALBUTEROL SULFATE (5 MG/ML) 0.5% IN NEBU: 5.0000 mg | INHALATION_SOLUTION | Freq: Once | RESPIRATORY_TRACT | Status: DC

## 2011-11-15 MED ORDER — ONDANSETRON HCL 4 MG PO TABS: 4.0000 mg | ORAL_TABLET | Freq: Three times a day (TID) | ORAL | Status: DC | PRN

## 2011-11-15 MED ORDER — HYDROCODONE-ACETAMINOPHEN 5-325 MG PO TABS
2.0000 | ORAL_TABLET | Freq: Once | ORAL | Status: AC
  Administered 2011-11-15: 2 via ORAL
  Filled 2011-11-15: qty 2

## 2011-11-15 MED ORDER — POLYETHYLENE GLYCOL 3350 17 G PO PACK: 17.0000 g | PACK | Freq: Every day | ORAL | Status: DC

## 2011-11-15 MED ORDER — IPRATROPIUM BROMIDE 0.02 % IN SOLN: 0.5000 mg | Freq: Once | RESPIRATORY_TRACT | Status: DC

## 2011-11-15 MED ORDER — DOCUSATE SODIUM 100 MG PO CAPS: 100.0000 mg | ORAL_CAPSULE | Freq: Two times a day (BID) | ORAL | Status: DC

## 2011-11-15 MED ORDER — DISPOSABLE ENEMA 19-7 GM/118ML RE ENEM: 1.0000 | ENEMA | Freq: Once | RECTAL | Status: DC

## 2011-11-15 MED ORDER — KETOROLAC TROMETHAMINE 30 MG/ML IJ SOLN
30.0000 mg | Freq: Once | INTRAMUSCULAR | Status: AC
  Administered 2011-11-15: 30 mg via INTRAVENOUS
  Filled 2011-11-15: qty 1

## 2011-11-15 MED ORDER — HYDROCODONE-ACETAMINOPHEN 5-325 MG PO TABS: 1.0000 | ORAL_TABLET | Freq: Four times a day (QID) | ORAL | Status: DC | PRN

## 2011-11-15 MED ORDER — ONDANSETRON HCL 4 MG/2ML IJ SOLN
4.0000 mg | Freq: Once | INTRAMUSCULAR | Status: DC
  Filled 2011-11-15: qty 2

## 2011-11-15 NOTE — ED Notes (Signed)
US at bedside

## 2011-11-15 NOTE — ED Notes (Signed)
Patient refused Zofran at this time

## 2011-11-15 NOTE — ED Notes (Signed)
C/o RUQ abdominal pain x 3 week. Saw MD Friday was told that the pain could be from her gallbladder, appendix or colitis, has a schedule CT this morning but couldn't make it to the CT appointment due to pain. Pt rate pain 10/10. Also has been constipated since Friday. Nausea no vomiting.

## 2011-11-15 NOTE — ED Provider Notes (Signed)
History     CSN: [id number]  Arrival date & time 11/15/11  1028   First MD Initiated Contact with Patient 11/15/11 1046      Chief Complaint  Patient presents with  . Abdominal Pain    RUQ    (Consider location/radiation/quality/duration/timing/severity/associated sxs/prior treatment) HPI Comments: Norma Sanchez 51 y.o. female   The chief complaint is: Patient presents with:   Abdominal Pain - RUQ   The patient has medical history significant for:   Past Medical History:   Multinodular goiter                                          Hypertension                                                 High cholesterol                                            Patient presents with RUQ pain x 3 weeks with some radiation to her back. There are no aggravating or alleviating factors. Associated symptoms include nausea and some constipation alternated with diarreha. Last bowel movement was Friday. Patient attributes the change in bowel pattern to her hypothyroidism that is poorly managed. Patient saw an MD on Friday who informed her that she had an elevated white count and that the pain was most likely her gall bladder. Of note she has a family history of Chrohn's, colitis, and a father who died from SBO complications. Denies fever or chills. Denies vomiting, melena, or hematochezia. Denies CP or SOB.     The history is provided by the patient.    Past Medical History  Diagnosis Date  . Multinodular goiter   . Hypertension   . High cholesterol     Past Surgical History  Procedure Date  . Abdominal hysterectomy     No family history on file.  History  Substance Use Topics  . Smoking status: Never Smoker   . Smokeless tobacco: Not on file  . Alcohol Use: No    OB History    Grav Para Term Preterm Abortions TAB SAB Ect Mult Living                  Review of Systems  Constitutional: Negative for fever and chills.  Respiratory: Negative for shortness of breath.     Cardiovascular: Negative for chest pain.  Gastrointestinal: Positive for nausea, abdominal pain, diarrhea and constipation. Negative for vomiting.  All other systems reviewed and are negative.    Allergies  Avelox; Codeine; Hydrochlorothiazide; Other; Sulfa antibiotics; and Synthroid  Home Medications  No current outpatient prescriptions on file.  BP 118/72  Pulse 70  Temp 98 F (36.7 C) (Oral)  Resp 16  SpO2 94%  Physical Exam  Nursing note and vitals reviewed. Constitutional: She appears well-developed and well-nourished.  HENT:  Head: Normocephalic and atraumatic.  Mouth/Throat: Oropharynx is clear and moist.  Eyes: Conjunctivae normal and EOM are normal. No scleral icterus.  Neck: Normal range of motion. Neck supple.  Cardiovascular: Normal rate, regular rhythm and normal heart sounds.   Abdominal:  Soft. Bowel sounds are normal. She exhibits no distension and no mass. There is tenderness. There is no rebound and no guarding.       Patient tender to palpation of epigastrium and RUQ. Questionable Murphy's sign.  Neurological: She is alert.  Skin: Skin is warm and dry.    ED Course  Procedures (including critical care time) Results for orders placed during the hospital encounter of 11/15/11  CBC WITH DIFFERENTIAL      Component Value Range   WBC 9.4  4.0 - 10.5 K/uL   RBC 5.01  3.87 - 5.11 MIL/uL   Hemoglobin 15.1 (*) 12.0 - 15.0 g/dL   HCT 16.1  09.6 - 04.5 %   MCV 86.4  78.0 - 100.0 fL   MCH 30.1  26.0 - 34.0 pg   MCHC 34.9  30.0 - 36.0 g/dL   RDW 40.9  81.1 - 91.4 %   Platelets 299  150 - 400 K/uL   Neutrophils Relative 62  43 - 77 %   Neutro Abs 5.8  1.7 - 7.7 K/uL   Lymphocytes Relative 26  12 - 46 %   Lymphs Abs 2.5  0.7 - 4.0 K/uL   Monocytes Relative 7  3 - 12 %   Monocytes Absolute 0.7  0.1 - 1.0 K/uL   Eosinophils Relative 4  0 - 5 %   Eosinophils Absolute 0.4  0.0 - 0.7 K/uL   Basophils Relative 1  0 - 1 %   Basophils Absolute 0.1  0.0 - 0.1 K/uL   COMPREHENSIVE METABOLIC PANEL      Component Value Range   Sodium 138  135 - 145 mEq/L   Potassium 4.1  3.5 - 5.1 mEq/L   Chloride 101  96 - 112 mEq/L   CO2 24  19 - 32 mEq/L   Glucose, Bld 99  70 - 99 mg/dL   BUN 14  6 - 23 mg/dL   Creatinine, Ser 7.82  0.50 - 1.10 mg/dL   Calcium 95.6  8.4 - 21.3 mg/dL   Total Protein 7.8  6.0 - 8.3 g/dL   Albumin 4.4  3.5 - 5.2 g/dL   AST 22  0 - 37 U/L   ALT 28  0 - 35 U/L   Alkaline Phosphatase 97  39 - 117 U/L   Total Bilirubin 0.6  0.3 - 1.2 mg/dL   GFR calc non Af Amer >90  >90 mL/min   GFR calc Af Amer >90  >90 mL/min  URINALYSIS, ROUTINE W REFLEX MICROSCOPIC      Component Value Range   Color, Urine YELLOW  YELLOW   APPearance CLOUDY (*) CLEAR   Specific Gravity, Urine 1.026  1.005 - 1.030   pH 5.5  5.0 - 8.0   Glucose, UA NEGATIVE  NEGATIVE mg/dL   Hgb urine dipstick NEGATIVE  NEGATIVE   Bilirubin Urine NEGATIVE  NEGATIVE   Ketones, ur TRACE (*) NEGATIVE mg/dL   Protein, ur NEGATIVE  NEGATIVE mg/dL   Urobilinogen, UA 0.2  0.0 - 1.0 mg/dL   Nitrite NEGATIVE  NEGATIVE   Leukocytes, UA MODERATE (*) NEGATIVE  LIPASE, BLOOD      Component Value Range   Lipase 27  11 - 59 U/L  URINE MICROSCOPIC-ADD ON      Component Value Range   Squamous Epithelial / LPF FEW (*) RARE   WBC, UA 3-6  <3 WBC/hpf   Bacteria, UA FEW (*) RARE   Urine-Other MUCOUS PRESENT  Labs Reviewed - No data to display US Abdomen Complete  11/15/2011  *RADIOLOGY REPORT*  Clinical Data:  Right upper quadrant pain.  COMPLETE ABDOMINAL ULTRASOUND  Comparison:  CT 06/15/2007  Findings:  Gallbladder:  No gallstones, gallbladder wall thickening, or pericholecystic fluid.  Common bile duct:   Within normal limits in caliber.  Liver:  Coarsened/increased echotexture throughout the liver suggesting fatty infiltration.  No focal abnormality.  IVC:  Appears normal.  Pancreas:  No focal abnormality seen.  Spleen:  Within normal limits in size and echotexture.  Right Kidney:    Normal in size and parenchymal echogenicity.  No evidence of mass or hydronephrosis.  Left Kidney:  Normal in size and parenchymal echogenicity.  No evidence of mass or hydronephrosis.  Abdominal aorta:  No aneurysm identified.  IMPRESSION: No acute findings.  Suspect mild fatty infiltration of the liver.   Original Report Authenticated By: Cyndie Chime, M.D.      1. Nausea   2. Constipation   3. RUQ pain       MDM  Patient presented with 3 weeks of RUQ pain. Patient given pain medication in ED with improvement. CBC, CMP, Lipase, UA: unremarkable.  Abdominal ultrasound: unremarkable. Patient discharged on pain and antinausea medication with recommendations to follow-up with primary care. No red flags for acute cholecystitis, pancreatitis, or cholangitis. Return precautions given verbally and in discharge summary.        Pixie Casino, PA-C 11/15/11 1424

## 2011-11-17 ENCOUNTER — Other Ambulatory Visit (HOSPITAL_COMMUNITY): Payer: Self-pay | Admitting: Internal Medicine

## 2011-11-17 DIAGNOSIS — R110 Nausea: Secondary | ICD-10-CM

## 2011-11-17 DIAGNOSIS — R1011 Right upper quadrant pain: Secondary | ICD-10-CM

## 2011-11-17 NOTE — ED Provider Notes (Signed)
Medical screening examination/treatment/procedure(s) were conducted as a shared visit with non-physician practitioner(s) and myself.  I personally evaluated the patient during the encounter Pt c/o ruq pain x 3 weeks. No fevers. No v/d. No gu c/o. No cp or sob. No pleuritic pain. No cough or fever. No leg pain or swelling. No back or flank pain. No hx gallstones or kidney stones. abd soft nt. U/s neg.   Suzi Roots, MD 11/17/11 [phone]

## 2011-11-19 ENCOUNTER — Encounter (HOSPITAL_COMMUNITY)
Admission: RE | Admit: 2011-11-19 | Discharge: 2011-11-19 | Disposition: A | Discharge status: Home / Self Care | Payer: 59 | Source: Ambulatory Visit | Attending: Internal Medicine | Admitting: Internal Medicine

## 2011-11-19 DIAGNOSIS — R1011 Right upper quadrant pain: Secondary | ICD-10-CM | POA: Insufficient documentation

## 2011-11-19 DIAGNOSIS — R110 Nausea: Secondary | ICD-10-CM | POA: Insufficient documentation

## 2011-11-19 MED ORDER — TECHNETIUM TC 99M MEBROFENIN IV KIT
5.0000 | PACK | Freq: Once | INTRAVENOUS | Status: AC | PRN
  Administered 2011-11-19: 5 via INTRAVENOUS

## 2011-11-19 NOTE — Progress Notes (Signed)
Kinevac 2.0u hung in nuc med, IV LAC  hr X 50 cc.

## 2011-11-30 ENCOUNTER — Other Ambulatory Visit (HOSPITAL_COMMUNITY): Payer: 59

## 2011-12-01 LAB — HM MAMMOGRAPHY: HM Mammogram: NORMAL

## 2011-12-03 ENCOUNTER — Ambulatory Visit (HOSPITAL_COMMUNITY)
Admission: RE | Admit: 2011-12-03 | Discharge: 2011-12-03 | Disposition: A | Discharge status: Home / Self Care | Payer: 59 | Source: Ambulatory Visit | Attending: Internal Medicine | Admitting: Internal Medicine

## 2011-12-03 ENCOUNTER — Other Ambulatory Visit (HOSPITAL_COMMUNITY): Payer: Self-pay | Admitting: Internal Medicine

## 2011-12-03 DIAGNOSIS — R109 Unspecified abdominal pain: Secondary | ICD-10-CM

## 2011-12-03 DIAGNOSIS — R1011 Right upper quadrant pain: Secondary | ICD-10-CM | POA: Insufficient documentation

## 2011-12-07 ENCOUNTER — Encounter (INDEPENDENT_AMBULATORY_CARE_PROVIDER_SITE_OTHER): Payer: Self-pay | Admitting: General Surgery

## 2011-12-07 ENCOUNTER — Ambulatory Visit (INDEPENDENT_AMBULATORY_CARE_PROVIDER_SITE_OTHER): Payer: Commercial Managed Care - PPO | Admitting: General Surgery

## 2011-12-07 VITALS — BP 124/88 | HR 72 | Temp 97.5°F | Resp 18 | Ht 61.0 in | Wt 227.8 lb

## 2011-12-07 DIAGNOSIS — K828 Other specified diseases of gallbladder: Secondary | ICD-10-CM

## 2011-12-07 HISTORY — DX: Other specified diseases of gallbladder: K82.8

## 2011-12-07 NOTE — Progress Notes (Signed)
Patient ID: Norma Sanchez, female   DOB: 07/15/1960, 51 y.o.   MRN: [id number]  No chief complaint on file.   HPI Norma Sanchez is a 51 y.o. female.   HPI  She is referred by Dr. Regino Schultze for further evaluation and treatment of Biliary dyskinesia.  She had an episode of severe abdominal pain with nausea three weeks ago.  This improved but she has been having intermittent RUQ pain without any reproducible stimulating factor.  No fever or chills.  She underwent and Abdominal US which showed no gallstones.  A HIDA scan demonstrated a depressed gallbladder ejection fraction of 7%-normal being 30% of greater.  No hx of jaundice or liver disease.  Past Medical History  Diagnosis Date  . Multinodular goiter   . Hypertension   . High cholesterol     Past Surgical History  Procedure Date  . Abdominal hysterectomy   . Tubal ligation   . Tonsillectomy   . Carpal tunnel release   . Knee surgery     right knee twice    History reviewed. No pertinent family history.  Social History History  Substance Use Topics  . Smoking status: Never Smoker   . Smokeless tobacco: Not on file  . Alcohol Use: No    Allergies  Allergen Reactions  . Avelox (Moxifloxacin Hcl In Nacl)   . Codeine   . Hydrochlorothiazide   . Other     All "Cillins"  . Sulfa Antibiotics   . Synthroid (Levothyroxine Sodium) Rash    Only the Generic Synthroid    Current Outpatient Prescriptions  Medication Sig Dispense Refill  . aMILoride (MIDAMOR) 5 MG tablet Take 5 mg by mouth daily. Take in morning      . Ascorbic Acid (VITAMIN C) 1000 MG tablet Take 1,000 mg by mouth daily.      Marland Kitchen aspirin 325 MG EC tablet Take 325 mg by mouth daily.      Marland Kitchen atorvastatin (LIPITOR) 10 MG tablet Take 10 mg by mouth daily. Take in the pm      . calcium carbonate (OS-CAL) 600 MG TABS Take 600 mg by mouth daily. With vitamin D      . candesartan (ATACAND) 8 MG tablet Take 16 mg by mouth daily. TAKE IN THE PM      . Cholecalciferol (VITAMIN  D-3) 5000 UNITS TABS Take 5,000 Units by mouth.      . esomeprazole (NEXIUM) 40 MG capsule Take 40 mg by mouth as needed.      . Glucosamine-Chondroitin (GLUCOSAMINE CHONDR COMPLEX PO) Take 1,500 mg by mouth. 1500mg /1200mg   Actual dose      . levothyroxine (SYNTHROID, LEVOTHROID) 75 MCG tablet Take 75 mcg by mouth daily. First thing in am on empty stomach      . OMEGA 3 1200 MG CAPS Take 1,200 mg by mouth.      . Probiotic Product (ALIGN) 4 MG CAPS Take 4 mg by mouth.      . propranolol (INDERAL) 80 MG tablet Take 80 mg by mouth 2 (two) times daily.        Review of Systems Review of Systems  Constitutional: Positive for unexpected weight change (weight loss).  Respiratory: Negative.   Cardiovascular: Negative.   Gastrointestinal: Positive for nausea, abdominal pain and constipation.  Genitourinary: Positive for hematuria.  Hematological: Negative.     Blood pressure 124/88, pulse 72, temperature 97.5 F (36.4 C), temperature source Temporal, resp. rate 18, height 5\' 1"  (1.549 m), weight  227 lb 12.8 oz (103.329 kg).  Physical Exam Physical Exam  Constitutional:       Overweight female in NAD.  HENT:  Head: Normocephalic and atraumatic.  Eyes: EOM are normal. No scleral icterus.  Cardiovascular: Normal rate and regular rhythm.   Pulmonary/Chest: Effort normal and breath sounds normal.  Abdominal: Soft. She exhibits no mass. There is tenderness (mild in RUQ). There is no guarding.  Musculoskeletal: She exhibits edema.  Lymphadenopathy:    She has no cervical adenopathy.  Neurological: She is alert.  Skin: Skin is warm and dry.    Data Reviewed [place] and HIDA scan reports.  Assessment    Symptomatic biliary dyskinesia    Plan    Laparoscopic cholecystectomy.  I have explained the procedure, risks, success rate,  and aftercare of cholecystectomy for biliary dyskinesia.  Risks include but are not limited to bleeding, infection, wound problems, anesthesia, diarrhea, bile  leak, injury to common bile duct/liver/intestine and failure to alleviate her symptoms.  She seems to understand and agrees to proceed.        Jarmal Lewelling J 12/07/2011, 2:15 PM

## 2011-12-07 NOTE — Patient Instructions (Signed)
Strict lowfat diet.  [facility] ______[facility] LAPAROSCOPIC SURGERY: POST OP INSTRUCTIONS Always review your discharge instruction sheet given to you by the facility where your surgery was performed. IF YOU HAVE DISABILITY OR FAMILY LEAVE FORMS, YOU MUST BRING THEM TO THE OFFICE FOR PROCESSING.   DO NOT GIVE THEM TO YOUR DOCTOR.  1. A prescription for pain medication may be given to you upon discharge.  Take your pain medication as prescribed, if needed.  If narcotic pain medicine is not needed, then you may take acetaminophen (Tylenol) or ibuprofen (Advil) as needed. 2. Take your usually prescribed medications unless otherwise directed. 3. If you need a refill on your pain medication, please contact your pharmacy.  They will contact our office to request authorization. Prescriptions will not be filled after 5pm or on week-ends. 4. You should follow a light diet the first few days after arrival home, such as soup and crackers, etc.  Be sure to include lots of fluids daily. 5. Most patients will experience some swelling and bruising in the area of the incisions.  Ice packs will help.  Swelling and bruising can take several days to resolve.  6. It is common to experience some constipation if taking pain medication after surgery.  Increasing fluid intake and taking a stool softener (such as Colace) will usually help or prevent this problem from occurring.  A mild laxative (Milk of Magnesia or Miralax) should be taken according to package instructions if there are no bowel movements after 48 hours. 7. Unless discharge instructions indicate otherwise, you may remove your bandages 24-48 hours after surgery, and you may shower at that time.  You may have steri-strips (small skin tapes) in place directly over the incision.  These strips should be left on the skin for 7-10 days.  If your surgeon used skin glue on the incision, you may shower in 24 hours.  The glue will flake off over the next 2-3  weeks.  Any sutures or staples will be removed at the office during your follow-up visit. 8. ACTIVITIES:  You may resume regular (light) daily activities beginning the next day-such as daily self-care, walking, climbing stairs-gradually increasing activities as tolerated.  You may have sexual intercourse when it is comfortable.  Refrain from any heavy lifting or straining until approved by your doctor. a. You may drive when you are no longer taking prescription pain medication, you can comfortably wear a seatbelt, and you can safely maneuver your car and apply brakes. b. RETURN TO WORK:  ____1-2 weeks______________________________________________________ 9. You should see your doctor in the office for a follow-up appointment approximately 2-3 weeks after your surgery.  Make sure that you call for this appointment within a day or two after you arrive home to insure a convenient appointment time. 10. OTHER INSTRUCTIONS: __________________________________________________________________________________________________________________________ __________________________________________________________________________________________________________________________ WHEN TO CALL YOUR DOCTOR: 1. Fever over 101.0 2. Inability to urinate 3. Continued bleeding from incision. 4. Increased pain, redness, or drainage from the incision. 5. Increasing abdominal pain  The clinic staff is available to answer your questions during regular business hours.  Please don't hesitate to call and ask to speak to one of the nurses for clinical concerns.  If you have a medical emergency, go to the nearest emergency room or call 911.  A surgeon from [facility] is always on call at the hospital. [address], [place], [place]  [address] ? [address] [address], [place], [place]   [address] [phone] ? [phone] ?  FAX [phone] Web site: [email or web address]

## 2011-12-20 ENCOUNTER — Encounter (HOSPITAL_COMMUNITY): Payer: Self-pay | Admitting: Pharmacy Technician

## 2011-12-22 NOTE — Patient Instructions (Addendum)
20 KAYANA THOEN  12/22/2011   Your procedure is scheduled on:  12-28-2011  Report to [facility] a 0530  AM.  Call this number if you have problems the morning of surgery: [phone]  Remember: driver for surgery and someone to stay with you for 24 hours after surgery edward [phone] home number   Do not eat food or drink liquids:After Midnight.  .  Take these medicines the morning of surgery with A SIP OF WATER: nexium, synthroid, propranolol   Do not wear jewelry or make up.  Do not wear lotions, powders, or perfumes. You may wear deodorant.    Do not bring valuables to the hospital.  Contacts, dentures or bridgework may not be worn into surgery.  Leave suitcase in the car. After surgery it may be brought to your room.  For patients admitted to the hospital, checkout time is 11:00 AM the day of discharge                             Patients discharged the day of surgery will not be allowed to drive home. If going home same day of surgery, you must have someone stay with you the first 24 hours at home and arrange for some one to drive you home from hospital.    Special Instructions: See [facility] Preparing for Surgery instruction sheet. Women do not shave legs or underarms for 12 hours before showers. Men may shave face morning of surgery.    Please read over the following fact sheets that you were given: MRSA Information  [facility] pre op nurse phone number [phone], call if needed

## 2011-12-22 NOTE — Progress Notes (Addendum)
Cbc with dif and cmet and ekg  12-03-2011 [facility] on chart  chest xray 12-03-2011 epic

## 2011-12-23 ENCOUNTER — Encounter (HOSPITAL_COMMUNITY): Payer: Self-pay

## 2011-12-23 ENCOUNTER — Encounter (HOSPITAL_COMMUNITY)
Admission: RE | Admit: 2011-12-23 | Discharge: 2011-12-23 | Disposition: A | Discharge status: Home / Self Care | Payer: 59 | Source: Ambulatory Visit | Attending: General Surgery | Admitting: General Surgery

## 2011-12-23 HISTORY — DX: Gastro-esophageal reflux disease without esophagitis: K21.9

## 2011-12-23 HISTORY — DX: Hypothyroidism, unspecified: E03.9

## 2011-12-28 ENCOUNTER — Encounter (HOSPITAL_COMMUNITY): Payer: Self-pay

## 2011-12-28 ENCOUNTER — Ambulatory Visit (HOSPITAL_COMMUNITY): Payer: 59

## 2011-12-28 ENCOUNTER — Encounter (HOSPITAL_COMMUNITY)
Admission: RE | Disposition: A | Discharge status: Home / Self Care | Payer: Self-pay | Source: Ambulatory Visit | Attending: General Surgery

## 2011-12-28 ENCOUNTER — Ambulatory Visit (HOSPITAL_COMMUNITY)
Admission: RE | Admit: 2011-12-28 | Discharge: 2011-12-28 | Disposition: A | Discharge status: Home / Self Care | Payer: 59 | Source: Ambulatory Visit | Attending: General Surgery | Admitting: General Surgery

## 2011-12-28 ENCOUNTER — Encounter (HOSPITAL_COMMUNITY): Payer: Self-pay | Admitting: Anesthesiology

## 2011-12-28 ENCOUNTER — Ambulatory Visit (HOSPITAL_COMMUNITY): Payer: 59 | Admitting: Anesthesiology

## 2011-12-28 DIAGNOSIS — K811 Chronic cholecystitis: Secondary | ICD-10-CM | POA: Insufficient documentation

## 2011-12-28 DIAGNOSIS — E7800 Pure hypercholesterolemia, unspecified: Secondary | ICD-10-CM | POA: Insufficient documentation

## 2011-12-28 DIAGNOSIS — Z79899 Other long term (current) drug therapy: Secondary | ICD-10-CM | POA: Insufficient documentation

## 2011-12-28 DIAGNOSIS — Z7982 Long term (current) use of aspirin: Secondary | ICD-10-CM | POA: Insufficient documentation

## 2011-12-28 DIAGNOSIS — Z01812 Encounter for preprocedural laboratory examination: Secondary | ICD-10-CM | POA: Insufficient documentation

## 2011-12-28 DIAGNOSIS — I10 Essential (primary) hypertension: Secondary | ICD-10-CM | POA: Insufficient documentation

## 2011-12-28 HISTORY — PX: CHOLECYSTECTOMY: SHX55

## 2011-12-28 SURGERY — LAPAROSCOPIC CHOLECYSTECTOMY WITH INTRAOPERATIVE CHOLANGIOGRAM
Anesthesia: General | Site: Abdomen | Wound class: Clean Contaminated

## 2011-12-28 MED ORDER — DEXAMETHASONE SODIUM PHOSPHATE 4 MG/ML IJ SOLN
INTRAMUSCULAR | Status: DC | PRN
  Administered 2011-12-28: 10 mg via INTRAVENOUS

## 2011-12-28 MED ORDER — MIDAZOLAM HCL 5 MG/5ML IJ SOLN
INTRAMUSCULAR | Status: DC | PRN
  Administered 2011-12-28: 1 mg via INTRAVENOUS

## 2011-12-28 MED ORDER — LACTATED RINGERS IV SOLN
INTRAVENOUS | Status: DC | PRN
  Administered 2011-12-28 (×2): via INTRAVENOUS

## 2011-12-28 MED ORDER — PROMETHAZINE HCL 25 MG/ML IJ SOLN
INTRAMUSCULAR | Status: AC
  Filled 2011-12-28: qty 1

## 2011-12-28 MED ORDER — PROMETHAZINE HCL 25 MG/ML IJ SOLN
6.2500 mg | INTRAMUSCULAR | Status: DC | PRN
  Administered 2011-12-28: 6.25 mg via INTRAVENOUS

## 2011-12-28 MED ORDER — PROPOFOL 10 MG/ML IV EMUL
INTRAVENOUS | Status: DC | PRN
  Administered 2011-12-28: 200 mg via INTRAVENOUS

## 2011-12-28 MED ORDER — BUPIVACAINE HCL 0.5 % IJ SOLN
INTRAMUSCULAR | Status: DC | PRN
  Administered 2011-12-28: 30 mL

## 2011-12-28 MED ORDER — LACTATED RINGERS IV SOLN: INTRAVENOUS | Status: DC | PRN

## 2011-12-28 MED ORDER — HYDROMORPHONE HCL PF 1 MG/ML IJ SOLN
INTRAMUSCULAR | Status: AC
  Filled 2011-12-28: qty 1

## 2011-12-28 MED ORDER — IOHEXOL 300 MG/ML  SOLN
INTRAMUSCULAR | Status: AC
  Filled 2011-12-28: qty 1

## 2011-12-28 MED ORDER — CIPROFLOXACIN IN D5W 400 MG/200ML IV SOLN
400.0000 mg | INTRAVENOUS | Status: AC
  Administered 2011-12-28: 400 mg via INTRAVENOUS

## 2011-12-28 MED ORDER — ACETAMINOPHEN 10 MG/ML IV SOLN
INTRAVENOUS | Status: DC | PRN
  Administered 2011-12-28: 1000 mg via INTRAVENOUS

## 2011-12-28 MED ORDER — GLYCOPYRROLATE 0.2 MG/ML IJ SOLN
INTRAMUSCULAR | Status: DC | PRN
  Administered 2011-12-28: 0.6 mg via INTRAVENOUS
  Administered 2011-12-28 (×2): 0.2 mg via INTRAVENOUS

## 2011-12-28 MED ORDER — LACTATED RINGERS IV SOLN
INTRAVENOUS | Status: DC | PRN
  Administered 2011-12-28: 1000 mL via INTRAVENOUS

## 2011-12-28 MED ORDER — IOHEXOL 300 MG/ML  SOLN
INTRAMUSCULAR | Status: DC | PRN
  Administered 2011-12-28: 50 mL via INTRAVENOUS

## 2011-12-28 MED ORDER — FENTANYL CITRATE 0.05 MG/ML IJ SOLN
INTRAMUSCULAR | Status: DC | PRN
  Administered 2011-12-28: 75 ug via INTRAVENOUS
  Administered 2011-12-28: 25 ug via INTRAVENOUS
  Administered 2011-12-28: 100 ug via INTRAVENOUS
  Administered 2011-12-28: 50 ug via INTRAVENOUS

## 2011-12-28 MED ORDER — LIDOCAINE HCL (CARDIAC) 20 MG/ML IV SOLN
INTRAVENOUS | Status: DC | PRN
  Administered 2011-12-28: 30 mg via INTRAVENOUS

## 2011-12-28 MED ORDER — CISATRACURIUM BESYLATE (PF) 10 MG/5ML IV SOLN
INTRAVENOUS | Status: DC | PRN
  Administered 2011-12-28: 6 mg via INTRAVENOUS
  Administered 2011-12-28: 3 mg via INTRAVENOUS

## 2011-12-28 MED ORDER — OXYCODONE-ACETAMINOPHEN 5-325 MG PO TABS: 1.0000 | ORAL_TABLET | ORAL | Status: DC | PRN

## 2011-12-28 MED ORDER — CIPROFLOXACIN IN D5W 400 MG/200ML IV SOLN
INTRAVENOUS | Status: AC
  Filled 2011-12-28: qty 200

## 2011-12-28 MED ORDER — SUCCINYLCHOLINE CHLORIDE 20 MG/ML IJ SOLN
INTRAMUSCULAR | Status: DC | PRN
  Administered 2011-12-28: 100 mg via INTRAVENOUS

## 2011-12-28 MED ORDER — HYDROMORPHONE HCL PF 1 MG/ML IJ SOLN
0.2500 mg | INTRAMUSCULAR | Status: DC | PRN
  Administered 2011-12-28 (×2): 0.5 mg via INTRAVENOUS

## 2011-12-28 MED ORDER — OXYCODONE HCL 5 MG PO TABS: 5.0000 mg | ORAL_TABLET | ORAL | Status: DC | PRN

## 2011-12-28 MED ORDER — ONDANSETRON HCL 4 MG/2ML IJ SOLN: 4.0000 mg | Freq: Four times a day (QID) | INTRAMUSCULAR | Status: DC | PRN

## 2011-12-28 MED ORDER — NEOSTIGMINE METHYLSULFATE 1 MG/ML IJ SOLN
INTRAMUSCULAR | Status: DC | PRN
  Administered 2011-12-28: 5 mg via INTRAVENOUS

## 2011-12-28 MED ORDER — ONDANSETRON HCL 4 MG/2ML IJ SOLN
INTRAMUSCULAR | Status: DC | PRN
  Administered 2011-12-28 (×2): 2 mg via INTRAVENOUS

## 2011-12-28 MED ORDER — BUPIVACAINE HCL (PF) 0.5 % IJ SOLN
INTRAMUSCULAR | Status: AC
  Filled 2011-12-28: qty 30

## 2011-12-28 MED ORDER — ACETAMINOPHEN 10 MG/ML IV SOLN
INTRAVENOUS | Status: AC
  Filled 2011-12-28: qty 100

## 2011-12-28 SURGICAL SUPPLY — 45 items
APPLIER CLIP 5 13 M/L LIGAMAX5 (MISCELLANEOUS) ×2
APPLIER CLIP ROT 10 11.4 M/L (STAPLE)
BENZOIN TINCTURE PRP APPL 2/3 (GAUZE/BANDAGES/DRESSINGS) ×2 IMPLANT
CANISTER SUCTION 2500CC (MISCELLANEOUS) ×2 IMPLANT
CHLORAPREP W/TINT 26ML (MISCELLANEOUS) ×2 IMPLANT
CLIP APPLIE 5 13 M/L LIGAMAX5 (MISCELLANEOUS) ×1 IMPLANT
CLIP APPLIE ROT 10 11.4 M/L (STAPLE) IMPLANT
CLOTH BEACON ORANGE TIMEOUT ST (SAFETY) ×2 IMPLANT
COVER MAYO STAND STRL (DRAPES) ×2 IMPLANT
COVER SURGICAL LIGHT HANDLE (MISCELLANEOUS) IMPLANT
DECANTER SPIKE VIAL GLASS SM (MISCELLANEOUS) ×2 IMPLANT
DRAPE C-ARM 42X72 X-RAY (DRAPES) ×2 IMPLANT
DRAPE LAPAROSCOPIC ABDOMINAL (DRAPES) ×2 IMPLANT
DRAPE UTILITY XL STRL (DRAPES) ×2 IMPLANT
DRSG TEGADERM 2-3/8X2-3/4 SM (GAUZE/BANDAGES/DRESSINGS) ×8 IMPLANT
DRSG TEGADERM 4X4.75 (GAUZE/BANDAGES/DRESSINGS) ×2 IMPLANT
ELECT REM PT RETURN 9FT ADLT (ELECTROSURGICAL) ×2
ELECTRODE REM PT RTRN 9FT ADLT (ELECTROSURGICAL) ×1 IMPLANT
ENDOLOOP SUT PDS II  0 18 (SUTURE)
ENDOLOOP SUT PDS II 0 18 (SUTURE) IMPLANT
GAUZE SPONGE 2X2 8PLY STRL LF (GAUZE/BANDAGES/DRESSINGS) ×1 IMPLANT
GLOVE BIOGEL PI IND STRL 7.0 (GLOVE) ×1 IMPLANT
GLOVE BIOGEL PI INDICATOR 7.0 (GLOVE) ×1
GLOVE ECLIPSE 8.0 STRL XLNG CF (GLOVE) ×2 IMPLANT
GLOVE INDICATOR 8.0 STRL GRN (GLOVE) ×4 IMPLANT
GOWN STRL NON-REIN LRG LVL3 (GOWN DISPOSABLE) IMPLANT
GOWN STRL REIN XL XLG (GOWN DISPOSABLE) ×8 IMPLANT
HEMOSTAT SNOW SURGICEL 2X4 (HEMOSTASIS) ×2 IMPLANT
HEMOSTAT SURGICEL 4X8 (HEMOSTASIS) IMPLANT
IV CATH 14GX2 1/4 (CATHETERS) IMPLANT
KIT BASIN OR (CUSTOM PROCEDURE TRAY) ×2 IMPLANT
NS IRRIG 1000ML POUR BTL (IV SOLUTION) ×2 IMPLANT
POUCH SPECIMEN RETRIEVAL 10MM (ENDOMECHANICALS) ×2 IMPLANT
SET CHOLANGIOGRAPH MIX (MISCELLANEOUS) ×2 IMPLANT
SET IRRIG TUBING LAPAROSCOPIC (IRRIGATION / IRRIGATOR) ×2 IMPLANT
SOLUTION ANTI FOG 6CC (MISCELLANEOUS) ×2 IMPLANT
SPONGE GAUZE 2X2 STER 10/PKG (GAUZE/BANDAGES/DRESSINGS) ×1
STRIP CLOSURE SKIN 1/2X4 (GAUZE/BANDAGES/DRESSINGS) ×2 IMPLANT
SUT MNCRL AB 4-0 PS2 18 (SUTURE) ×2 IMPLANT
TOWEL OR 17X26 10 PK STRL BLUE (TOWEL DISPOSABLE) ×2 IMPLANT
TRAY LAP CHOLE (CUSTOM PROCEDURE TRAY) ×2 IMPLANT
TROCAR BLADELESS OPT 5 75 (ENDOMECHANICALS) ×4 IMPLANT
TROCAR XCEL BLUNT TIP 100MML (ENDOMECHANICALS) ×2 IMPLANT
TROCAR XCEL NON-BLD 11X100MML (ENDOMECHANICALS) IMPLANT
TUBING INSUFFLATION 10FT LAP (TUBING) ×2 IMPLANT

## 2011-12-28 NOTE — Anesthesia Preprocedure Evaluation (Signed)
Anesthesia Evaluation  Patient identified by MRN, date of birth, ID band Patient awake    Reviewed: Allergy & Precautions, H&P , NPO status , Patient's Chart, lab work & pertinent test results  Airway Mallampati: II TM Distance: >3 FB Neck ROM: Full    Dental No notable dental hx.    Pulmonary neg pulmonary ROS,  breath sounds clear to auscultation  Pulmonary exam normal       Cardiovascular hypertension, Pt. on medications and Pt. on home beta blockers Rhythm:Regular Rate:Normal     Neuro/Psych negative neurological ROS  negative psych ROS   GI/Hepatic Neg liver ROS, GERD-  Medicated,  Endo/Other  Hypothyroidism Morbid obesity  Renal/GU negative Renal ROS  negative genitourinary   Musculoskeletal negative musculoskeletal ROS (+)   Abdominal   Peds negative pediatric ROS (+)  Hematology negative hematology ROS (+)   Anesthesia Other Findings   Reproductive/Obstetrics negative OB ROS                           Anesthesia Physical Anesthesia Plan  ASA: III  Anesthesia Plan: General   Post-op Pain Management:    Induction: Intravenous  Airway Management Planned: Oral ETT  Additional Equipment:   Intra-op Plan:   Post-operative Plan: Extubation in OR  Informed Consent: I have reviewed the patients History and Physical, chart, labs and discussed the procedure including the risks, benefits and alternatives for the proposed anesthesia with the patient or authorized representative who has indicated his/her understanding and acceptance.   Dental advisory given  Plan Discussed with: CRNA  Anesthesia Plan Comments:         Anesthesia Quick Evaluation

## 2011-12-28 NOTE — Interval H&P Note (Signed)
History and Physical Interval Note:  12/28/2011 7:33 AM  Norma Sanchez  has presented today for surgery, with the diagnosis of biliary dyskineisa  The various methods of treatment have been discussed with the patient and family. After consideration of risks, benefits and other options for treatment, the patient has consented to  Procedure(s) (LRB) with comments: LAPAROSCOPIC CHOLECYSTECTOMY WITH INTRAOPERATIVE CHOLANGIOGRAM (N/A) as a surgical intervention .  The patient's history has been reviewed, patient examined, no change in status, stable for surgery.  I have reviewed the patient's chart and labs.  Questions were answered to the patient's satisfaction.     Iyona Pehrson Shela Commons

## 2011-12-28 NOTE — Anesthesia Postprocedure Evaluation (Signed)
  Anesthesia Post-op Note  Patient: Norma Sanchez  Procedure(s) Performed: Procedure(s) (LRB): LAPAROSCOPIC CHOLECYSTECTOMY WITH INTRAOPERATIVE CHOLANGIOGRAM (N/A)  Patient Location: PACU  Anesthesia Type: General  Level of Consciousness: awake and alert   Airway and Oxygen Therapy: Patient Spontanous Breathing  Post-op Pain: mild  Post-op Assessment: Post-op Vital signs reviewed, Patient's Cardiovascular Status Stable, Respiratory Function Stable, Patent Airway and No signs of Nausea or vomiting  Post-op Vital Signs: stable  Complications: No apparent anesthesia complications

## 2011-12-28 NOTE — H&P (View-Only) (Signed)
Patient ID: Norma Sanchez, female   DOB: 03/30/1960, 51 y.o.   MRN: [id number]  No chief complaint on file.   HPI Norma Sanchez is a 51 y.o. female.   HPI  She is referred by Dr. McGough for further evaluation and treatment of Biliary dyskinesia.  She had an episode of severe abdominal pain with nausea three weeks ago.  This improved but she has been having intermittent RUQ pain without any reproducible stimulating factor.  No fever or chills.  She underwent and Abdominal US which showed no gallstones.  A HIDA scan demonstrated a depressed gallbladder ejection fraction of 7%-normal being 30% of greater.  No hx of jaundice or liver disease.  Past Medical History  Diagnosis Date  . Multinodular goiter   . Hypertension   . High cholesterol     Past Surgical History  Procedure Date  . Abdominal hysterectomy   . Tubal ligation   . Tonsillectomy   . Carpal tunnel release   . Knee surgery     right knee twice    History reviewed. No pertinent family history.  Social History History  Substance Use Topics  . Smoking status: Never Smoker   . Smokeless tobacco: Not on file  . Alcohol Use: No    Allergies  Allergen Reactions  . Avelox (Moxifloxacin Hcl In Nacl)   . Codeine   . Hydrochlorothiazide   . Other     All "Cillins"  . Sulfa Antibiotics   . Synthroid (Levothyroxine Sodium) Rash    Only the Generic Synthroid    Current Outpatient Prescriptions  Medication Sig Dispense Refill  . aMILoride (MIDAMOR) 5 MG tablet Take 5 mg by mouth daily. Take in morning      . Ascorbic Acid (VITAMIN C) 1000 MG tablet Take 1,000 mg by mouth daily.      . aspirin 325 MG EC tablet Take 325 mg by mouth daily.      . atorvastatin (LIPITOR) 10 MG tablet Take 10 mg by mouth daily. Take in the pm      . calcium carbonate (OS-CAL) 600 MG TABS Take 600 mg by mouth daily. With vitamin D      . candesartan (ATACAND) 8 MG tablet Take 16 mg by mouth daily. TAKE IN THE PM      . Cholecalciferol (VITAMIN  D-3) 5000 UNITS TABS Take 5,000 Units by mouth.      . esomeprazole (NEXIUM) 40 MG capsule Take 40 mg by mouth as needed.      . Glucosamine-Chondroitin (GLUCOSAMINE CHONDR COMPLEX PO) Take 1,500 mg by mouth. 1500mg/1200mg  Actual dose      . levothyroxine (SYNTHROID, LEVOTHROID) 75 MCG tablet Take 75 mcg by mouth daily. First thing in am on empty stomach      . OMEGA 3 1200 MG CAPS Take 1,200 mg by mouth.      . Probiotic Product (ALIGN) 4 MG CAPS Take 4 mg by mouth.      . propranolol (INDERAL) 80 MG tablet Take 80 mg by mouth 2 (two) times daily.        Review of Systems Review of Systems  Constitutional: Positive for unexpected weight change (weight loss).  Respiratory: Negative.   Cardiovascular: Negative.   Gastrointestinal: Positive for nausea, abdominal pain and constipation.  Genitourinary: Positive for hematuria.  Hematological: Negative.     Blood pressure 124/88, pulse 72, temperature 97.5 F (36.4 C), temperature source Temporal, resp. rate 18, height 5' 1" (1.549 m), weight   227 lb 12.8 oz (103.329 kg).  Physical Exam Physical Exam  Constitutional:       Overweight female in NAD.  HENT:  Head: Normocephalic and atraumatic.  Eyes: EOM are normal. No scleral icterus.  Cardiovascular: Normal rate and regular rhythm.   Pulmonary/Chest: Effort normal and breath sounds normal.  Abdominal: Soft. She exhibits no mass. There is tenderness (mild in RUQ). There is no guarding.  Musculoskeletal: She exhibits edema.  Lymphadenopathy:    She has no cervical adenopathy.  Neurological: She is alert.  Skin: Skin is warm and dry.    Data Reviewed US and HIDA scan reports.  Assessment    Symptomatic biliary dyskinesia    Plan    Laparoscopic cholecystectomy.  I have explained the procedure, risks, success rate,  and aftercare of cholecystectomy for biliary dyskinesia.  Risks include but are not limited to bleeding, infection, wound problems, anesthesia, diarrhea, bile  leak, injury to common bile duct/liver/intestine and failure to alleviate her symptoms.  She seems to understand and agrees to proceed.        Orian Amberg J 12/07/2011, 2:15 PM    

## 2011-12-28 NOTE — Progress Notes (Signed)
Dr. Council Mechanic in- made aware of patient's heart rates

## 2011-12-28 NOTE — Op Note (Signed)
Preoperative diagnosis:  Biliary dyskinesia  Postoperative diagnosis:  Same  Procedure: Laparoscopic cholecystectomy.  Surgeon: Avel Peace, M.D.  Asst.:  Gaynelle Adu, M.D.  Anesthesia: General  Indication:   This is a 51 year old female with intermittent RUQ pain and a gallbladder ejection fraction of 7%.  US demonstrates no gallstones and a normal CBD diameter.  She now presents for elective cholecystectomy.  Technique: She was brought to the operating room, placed supine on the operating table, and a general anesthetic was administered.  The abdominal wall was then sterilely prepped and draped. Local anesthetic (Marcaine) was infiltrated in the subumbilical region. A small subumbilical incision was made through the skin, subcutaneous tissue, fascia, and peritoneum entering the peritoneal cavity under direct vision. A pursestring suture of 0 Vicryl was placed around the edges of the fascia. A Hassan trocar was introduced into the peritoneal cavity and a pneumoperitoneum was created by insufflation of carbon dioxide gas. The laparoscope was introduced into the trocar and no underlying bleeding or organ injury was noted. The patient was then placed in the reverse Trendelenburg position with the right side tilted slightly up.  Three more trocars were then placed into the abdominal cavity under laparoscopic vision. One in the epigastric area, and 2 in the right upper quadrant area. The gallbladder was visualized, no acute inflammatory changes were noted, and the fundus was grasped and retracted toward the right shoulder.  The infundibulum was mobilized with dissection close to the gallbladder and retracted laterally. The cystic duct was identified and a window was created around it. The cystic artery was also identified and a window was created around it.  The artery was clipped and divided. The critical view was achieved. A clip was placed at the neck of the gallbladder. A small incision was made  in the cystic duct. A cholangiocatheter was introduced through the anterior abdominal wall and placed in the cystic duct but would only pass partially into the cystic duct.  I injected saline into the cystic duct but it would not pass through the duct, rather it leaked around the catheter consistent with a cystic duct obstruction possible from a valve.  Given that her CBD was normal on [place] and her liver function tests were normal, I did not pursue the cholangiogram.  The cholangiocatheter was removed, the cystic duct was clipped 3 times on the biliary side, and then the cystic duct was divided sharply. No bile leak was noted from the cystic duct stump.   Following this the gallbladder was dissected free from the liver using electrocautery.  A small puncture was made in the gallbladder and bile leaked out. The gallbladder was then placed in a retrieval bag and removed from the abdominal cavity through the subumbilical incision.  The gallbladder fossa was inspected, copiously irrigated, and bleeding was controlled with electrocautery. Inspection showed that hemostasis was adequate and there was no evidence of bile leak.  The irrigation fluid was evacuated as much as possible.  Surgicel was placed in the gallbladder fossa.  The subumbilical trocar was removed and the fascial defect was closed by tightening and tying down the pursestring suture under laparoscopic vision.  The remaining trocars were removed and the pneumoperitoneum was released. The skin incisions were closed with 4-0 Monocryl subcuticular stitches. Steri-Strips and sterile dressings were applied.  The procedure was well-tolerated without any apparent complications. The patient was taken to the recovery room in satisfactory condition.

## 2011-12-28 NOTE — Transfer of Care (Signed)
Immediate Anesthesia Transfer of Care Note  Patient: Norma Sanchez  Procedure(s) Performed: Procedure(s) (LRB) with comments: LAPAROSCOPIC CHOLECYSTECTOMY WITH INTRAOPERATIVE CHOLANGIOGRAM (N/A) - Laparoscopic cholecystectomy with attempted cholangiogram  Patient Location: PACU  Anesthesia Type:General  Level of Consciousness: sedated  Airway & Oxygen Therapy: Patient Spontanous Breathing and Patient connected to face mask oxygen  Post-op Assessment: Report given to PACU RN and Post -op Vital signs reviewed and stable  Post vital signs: Reviewed and stable  Complications: No apparent anesthesia complications

## 2011-12-29 ENCOUNTER — Encounter (HOSPITAL_COMMUNITY): Payer: Self-pay | Admitting: General Surgery

## 2012-01-12 ENCOUNTER — Ambulatory Visit (INDEPENDENT_AMBULATORY_CARE_PROVIDER_SITE_OTHER): Payer: Commercial Managed Care - PPO | Admitting: General Surgery

## 2012-01-12 ENCOUNTER — Encounter (INDEPENDENT_AMBULATORY_CARE_PROVIDER_SITE_OTHER): Payer: Self-pay | Admitting: General Surgery

## 2012-01-12 VITALS — BP 116/68 | HR 66 | Temp 98.0°F | Resp 18 | Ht 61.0 in | Wt 235.2 lb

## 2012-01-12 DIAGNOSIS — Z9889 Other specified postprocedural states: Secondary | ICD-10-CM

## 2012-01-12 NOTE — Progress Notes (Signed)
  She is here for a postop visit following laparoscopic cholecystectomy for biliary dyskinesia.  Her preop symptoms are significantly improved.  Diet is being tolerated, bowels are moving.  She has some soreness at the umbilical incision site from her clothes rubbing against that area.  This began yesterday.  PE:  ABD:  Soft, incisions clean/dry/intact and solid; mild irritation at umbilical incision but no erythema or warmth.  Assessment:  Doing well postop.  Her clothes appear to be irritating her umbilical incision.  Plan:  Lowfat diet recommended.  Activities as tolerated.  Keep a bandaid over the umbilical incision until it heals better.  Return visit prn.

## 2012-01-12 NOTE — Patient Instructions (Signed)
Low fat diet. Activities as tolerated. 

## 2012-03-08 ENCOUNTER — Encounter (INDEPENDENT_AMBULATORY_CARE_PROVIDER_SITE_OTHER): Payer: Self-pay

## 2013-01-11 ENCOUNTER — Other Ambulatory Visit: Payer: Self-pay | Admitting: Emergency Medicine

## 2013-01-15 ENCOUNTER — Ambulatory Visit: Payer: Commercial Managed Care - PPO | Admitting: Physician Assistant

## 2013-01-15 ENCOUNTER — Encounter: Payer: Self-pay | Admitting: Physician Assistant

## 2013-01-15 ENCOUNTER — Ambulatory Visit (HOSPITAL_COMMUNITY)
Admission: RE | Admit: 2013-01-15 | Discharge: 2013-01-15 | Disposition: A | Discharge status: Home / Self Care | Payer: 59 | Source: Ambulatory Visit | Attending: Physician Assistant | Admitting: Physician Assistant

## 2013-01-15 VITALS — BP 138/84 | HR 76 | Temp 97.9°F | Resp 16 | Ht 60.5 in | Wt 246.0 lb

## 2013-01-15 DIAGNOSIS — J209 Acute bronchitis, unspecified: Secondary | ICD-10-CM | POA: Insufficient documentation

## 2013-01-15 DIAGNOSIS — R059 Cough, unspecified: Secondary | ICD-10-CM | POA: Insufficient documentation

## 2013-01-15 DIAGNOSIS — R05 Cough: Secondary | ICD-10-CM | POA: Insufficient documentation

## 2013-01-15 MED ORDER — IPRATROPIUM BROMIDE 0.02 % IN SOLN: 0.5000 mg | Freq: Once | RESPIRATORY_TRACT | Status: DC

## 2013-01-15 MED ORDER — FLUCONAZOLE 150 MG PO TABS: 150.0000 mg | ORAL_TABLET | Freq: Once | ORAL | Status: DC

## 2013-01-15 MED ORDER — ALBUTEROL SULFATE (2.5 MG/3ML) 0.083% IN NEBU: 2.5000 mg | INHALATION_SOLUTION | Freq: Once | RESPIRATORY_TRACT | Status: DC

## 2013-01-15 MED ORDER — IPRATROPIUM-ALBUTEROL 0.5-2.5 (3) MG/3ML IN SOLN
3.0000 mL | Freq: Once | RESPIRATORY_TRACT | Status: AC
  Administered 2013-01-15: 3 mL via RESPIRATORY_TRACT

## 2013-01-15 MED ORDER — LEVOFLOXACIN 500 MG PO TABS: 500.0000 mg | ORAL_TABLET | Freq: Every day | ORAL | Status: DC

## 2013-01-15 MED ORDER — HYDROCODONE-ACETAMINOPHEN 5-325 MG PO TABS: ORAL_TABLET | ORAL | Status: DC

## 2013-01-15 NOTE — Progress Notes (Signed)
Subjective:    Patient ID: KADE RICKELS, female    DOB: May 29, 1960, 52 y.o.   MRN: [id number]  Cough This is a new problem. The current episode started in the past 7 days. The problem has been gradually worsening. The problem occurs constantly. The cough is non-productive. Associated symptoms include chest pain (with coughing), ear congestion, ear pain (left worse than right), myalgias and wheezing. Pertinent negatives include no chills, fever, hemoptysis, nasal congestion, postnasal drip, sore throat, shortness of breath, sweats or weight loss. She has tried prescription cough suppressant for the symptoms.  Had it back in October and was given zpack- then started one week ago and doxy and cough med called in without improvement  Current Outpatient Prescriptions on File Prior to Visit  Medication Sig Dispense Refill  . aMILoride (MIDAMOR) 5 MG tablet Take 5 mg by mouth every morning.       . Ascorbic Acid (VITAMIN C) 1000 MG tablet Take 1,000 mg by mouth daily.      Marland Kitchen aspirin 325 MG EC tablet Take 325 mg by mouth every morning.       Marland Kitchen atorvastatin (LIPITOR) 10 MG tablet Take 10 mg by mouth every evening.       . calcium carbonate (OS-CAL) 600 MG TABS Take 600 mg by mouth daily. With vitamin D      . candesartan (ATACAND) 8 MG tablet Take 16 mg by mouth daily after lunch. TAKE IN THE PM      . Cholecalciferol (VITAMIN D-3) 5000 UNITS TABS Take 5,000 Units by mouth.      . diclofenac sodium (VOLTAREN) 1 % GEL Apply 2 g topically 3 (three) times daily as needed. Apply to painful areas on knees      . esomeprazole (NEXIUM) 40 MG capsule Take 40 mg by mouth as needed. For acid reflux      . Glucosamine-Chondroitin (GLUCOSAMINE CHONDR COMPLEX PO) Take 1,500 mg by mouth. 1500mg /1200mg   Actual dose      . levothyroxine (SYNTHROID, LEVOTHROID) 100 MCG tablet Take 100 mcg by mouth every morning. Patient can only take brand synthroid. Take as soon as patient wakes and on an empty stomach an hour to an hour  and a half before food. No vitamins for 4 hours after.      . OMEGA 3 1200 MG CAPS Take 1,200 mg by mouth.      . propranolol (INDERAL) 80 MG tablet Take 80 mg by mouth 2 (two) times daily.       No current facility-administered medications on file prior to visit.   Past Medical History  Diagnosis Date  . Multinodular goiter   . Hypertension   . High cholesterol   . Hypothyroidism   . GERD (gastroesophageal reflux disease)     Review of Systems  Constitutional: Positive for fatigue. Negative for fever, chills and weight loss.  HENT: Positive for ear pain (left worse than right). Negative for postnasal drip and sore throat.   Respiratory: Positive for cough, chest tightness and wheezing. Negative for hemoptysis and shortness of breath.   Cardiovascular: Positive for chest pain (with coughing).  Musculoskeletal: Positive for myalgias.       Objective:   Physical Exam  Constitutional: She appears well-developed and well-nourished.  HENT:  Head: Normocephalic and atraumatic.  Right Ear: Tympanic membrane, external ear and ear canal normal.  Left Ear: External ear normal. Tympanic membrane is scarred, perforated, erythematous and bulging.  Mouth/Throat: Oropharynx is clear and moist.  Eyes:  Conjunctivae are normal. Pupils are equal, round, and reactive to light.  Neck: Normal range of motion. Neck supple.  Abdominal: Soft. Bowel sounds are normal.          Assessment & Plan:  1. Acute bronchitis - DG Chest (CXR) PA & Lateral - levofloxacin (LEVAQUIN) 500 MG tablet; Take 1 tablet (500 mg total) by mouth daily.  Dispense: 10 tablet; Refill: 0 - ipratropium (ATROVENT) nebulizer solution 0.5 mg; Take 2.5 mLs (0.5 mg total) by nebulization once. - albuterol (PROVENTIL) (2.5 MG/3ML) 0.083% nebulizer solution 2.5 mg; Take 3 mLs (2.5 mg total) by nebulization once. - HYDROcodone-acetaminophen (NORCO) 5-325 MG per tablet; 1/2-1 pill PRN q 8 hours for cough  Dispense: 30 tablet;  Refill: 0  2. Yeast  Diflucan 150 #1

## 2013-01-15 NOTE — Patient Instructions (Signed)

## 2013-01-15 NOTE — Addendum Note (Signed)
Addended by: Quentin Mulling R on: 01/15/2013 04:08 PM   Modules accepted: Orders

## 2013-01-22 ENCOUNTER — Encounter: Payer: Self-pay | Admitting: Internal Medicine

## 2013-01-22 DIAGNOSIS — K219 Gastro-esophageal reflux disease without esophagitis: Secondary | ICD-10-CM

## 2013-01-22 DIAGNOSIS — E039 Hypothyroidism, unspecified: Secondary | ICD-10-CM | POA: Insufficient documentation

## 2013-01-22 DIAGNOSIS — I10 Essential (primary) hypertension: Secondary | ICD-10-CM | POA: Insufficient documentation

## 2013-01-22 DIAGNOSIS — E7800 Pure hypercholesterolemia, unspecified: Secondary | ICD-10-CM

## 2013-01-22 DIAGNOSIS — E785 Hyperlipidemia, unspecified: Secondary | ICD-10-CM | POA: Insufficient documentation

## 2013-01-23 ENCOUNTER — Ambulatory Visit (INDEPENDENT_AMBULATORY_CARE_PROVIDER_SITE_OTHER): Payer: Commercial Managed Care - PPO | Admitting: Emergency Medicine

## 2013-01-23 DIAGNOSIS — H6690 Otitis media, unspecified, unspecified ear: Secondary | ICD-10-CM

## 2013-01-23 DIAGNOSIS — J309 Allergic rhinitis, unspecified: Secondary | ICD-10-CM

## 2013-01-23 MED ORDER — DEXAMETHASONE SODIUM PHOSPHATE 100 MG/10ML IJ SOLN
10.0000 mg | Freq: Once | INTRAMUSCULAR | Status: AC
  Administered 2013-01-23: 10 mg via INTRAMUSCULAR

## 2013-01-23 MED ORDER — CEFTRIAXONE SODIUM 500 MG IJ SOLR
500.0000 mg | Freq: Once | INTRAMUSCULAR | Status: AC
  Administered 2013-01-23: 500 mg via INTRAMUSCULAR

## 2013-01-23 MED ORDER — FLUTICASONE PROPIONATE 50 MCG/ACT NA SUSP: 1.0000 | Freq: Every day | NASAL | Status: DC

## 2013-01-23 MED ORDER — CIPROFLOXACIN-HYDROCORTISONE 0.2-1 % OT SUSP: 4.0000 [drp] | Freq: Two times a day (BID) | OTIC | Status: AC

## 2013-01-23 MED ORDER — DEXAMETHASONE SODIUM PHOSPHATE 10 MG/ML IJ SOLN: 10.0000 mg | Freq: Once | INTRAMUSCULAR | Status: DC

## 2013-01-23 NOTE — Progress Notes (Signed)
Subjective:    Patient ID: Norma Sanchez, female    DOB: March 07, 1960, 52 y.o.   MRN: [id number]  HPI Comments: 52 YO treated with Levaquin x 9 days and still with mild cough/ wheeze but improved sinus congestion. She is concerned because her left ear is painful and has noticed decreased hearing. She has not restarted NS AD.    Current Outpatient Prescriptions on File Prior to Visit  Medication Sig Dispense Refill  . aMILoride (MIDAMOR) 5 MG tablet Take 5 mg by mouth every morning.       . Ascorbic Acid (VITAMIN C) 1000 MG tablet Take 1,000 mg by mouth daily.      Marland Kitchen aspirin 325 MG EC tablet Take 325 mg by mouth every morning.       Marland Kitchen atorvastatin (LIPITOR) 10 MG tablet Take 10 mg by mouth every evening.       . calcium carbonate (OS-CAL) 600 MG TABS Take 600 mg by mouth daily. With vitamin D      . candesartan (ATACAND) 8 MG tablet Take 16 mg by mouth daily after lunch. TAKE IN THE PM      . Cholecalciferol (VITAMIN D-3) 5000 UNITS TABS Take 5,000 Units by mouth.      . diclofenac sodium (VOLTAREN) 1 % GEL Apply 2 g topically 3 (three) times daily as needed. Apply to painful areas on knees      . esomeprazole (NEXIUM) 40 MG capsule Take 40 mg by mouth as needed. For acid reflux      . fluconazole (DIFLUCAN) 150 MG tablet Take 1 tablet (150 mg total) by mouth once.  1 tablet  1  . Glucosamine-Chondroitin (GLUCOSAMINE CHONDR COMPLEX PO) Take 1,500 mg by mouth. 1500mg /1200mg   Actual dose      . HYDROcodone-acetaminophen (NORCO) 5-325 MG per tablet 1/2-1 pill PRN q 8 hours for cough  30 tablet  0  . levofloxacin (LEVAQUIN) 500 MG tablet Take 1 tablet (500 mg total) by mouth daily.  10 tablet  0  . levothyroxine (SYNTHROID, LEVOTHROID) 100 MCG tablet Take 100 mcg by mouth every morning. Patient can only take brand synthroid. Take as soon as patient wakes and on an empty stomach an hour to an hour and a half before food. No vitamins for 4 hours after.      . OMEGA 3 1200 MG CAPS Take 1,200 mg by  mouth.      . propranolol (INDERAL) 80 MG tablet Take 80 mg by mouth 2 (two) times daily.       No current facility-administered medications on file prior to visit.   ALLERGIES Avelox; Hydrochlorothiazide; Other; Penicillins; Prednisone; Zostavax; Codeine; Levothyroxine; and Sulfa antibiotics  Review of Systems  HENT: Positive for congestion and ear pain.   Respiratory: Positive for cough and wheezing.   All other systems reviewed and are negative.    BP 120/86  Pulse 60  Temp(Src) 97.5 F (36.4 C)  Resp 18  Wt 248 lb 3.2 oz (112.583 kg)     Objective:   Physical Exam  Nursing note and vitals reviewed. Constitutional: She is oriented to person, place, and time. She appears well-developed.  HENT:  Head: Normocephalic and atraumatic.  Right Ear: External ear normal.  Left Ear: External ear normal.  Nose: Nose normal.  Mouth/Throat: Oropharynx is clear and moist. No oropharyngeal exudate.  Left TM and canal erythema  Cardiovascular: Normal rate, regular rhythm, normal heart sounds and intact distal pulses.   Pulmonary/Chest: Effort normal  and breath sounds normal. She has no wheezes.  Musculoskeletal: Normal range of motion.  Lymphadenopathy:    She has no cervical adenopathy.  Neurological: She is alert and oriented to person, place, and time.  Skin: Skin is warm and dry.  Psychiatric: Judgment normal.          Assessment & Plan:  Otits Media/ Externa- Rocephin 500mg , Dexamethasone 10, Cipro HC Otic, restart NS AD, Add Allegra if Symptoms continue needs ENT eval

## 2013-01-23 NOTE — Patient Instructions (Signed)
Otitis Externa  Otitis externa is a germ infection in the outer ear. The outer ear is the area from the eardrum to the outside of the ear. Otitis externa is sometimes called "swimmer's ear."  HOME CARE   Put drops in the ear as told by your doctor.   Only take medicine as told by your doctor.   If you have diabetes, your doctor may give you more directions. Follow your doctor's directions.   Keep all doctor visits as told.  To avoid another infection:   Keep your ear dry. Use the corner of a towel to dry your ear after swimming or bathing.   Avoid scratching or putting things inside your ear.   Avoid swimming in lakes, dirty water, or pools that use a chemical called chlorine poorly.   You may use ear drops after swimming. Combine equal amounts of white vinegar and alcohol in a bottle. Put 3 or 4 drops in each ear.  GET HELP RIGHT AWAY IF:    You have a fever.   Your ear is still red, puffy (swollen), or painful after 3 days.   You still have yellowish-white fluid (pus) coming from the ear after 3 days.   Your redness, puffiness, or pain gets worse.   You have a really bad headache.   You have redness, puffiness, pain, or tenderness behind your ear.  MAKE SURE YOU:    Understand these instructions.   Will watch your condition.   Will get help right away if you are not doing well or get worse.  Document Released: 07/28/2007 Document Revised: 05/03/2011 Document Reviewed: 02/25/2011  [employer] Patient Information 2014 [employer]

## 2013-01-24 ENCOUNTER — Encounter: Payer: Self-pay | Admitting: Emergency Medicine

## 2013-01-31 ENCOUNTER — Other Ambulatory Visit: Payer: Self-pay | Admitting: Emergency Medicine

## 2013-01-31 DIAGNOSIS — R059 Cough, unspecified: Secondary | ICD-10-CM

## 2013-01-31 DIAGNOSIS — R05 Cough: Secondary | ICD-10-CM

## 2013-01-31 MED ORDER — AZITHROMYCIN 250 MG PO TABS: ORAL_TABLET | ORAL | Status: DC

## 2013-02-01 ENCOUNTER — Ambulatory Visit (INDEPENDENT_AMBULATORY_CARE_PROVIDER_SITE_OTHER): Payer: 59 | Admitting: Emergency Medicine

## 2013-02-01 ENCOUNTER — Encounter: Payer: Self-pay | Admitting: Emergency Medicine

## 2013-02-01 VITALS — BP 130/90 | HR 98 | Ht 61.0 in | Wt 251.0 lb

## 2013-02-01 DIAGNOSIS — R05 Cough: Secondary | ICD-10-CM

## 2013-02-01 DIAGNOSIS — R053 Chronic cough: Secondary | ICD-10-CM | POA: Insufficient documentation

## 2013-02-01 DIAGNOSIS — R059 Cough, unspecified: Secondary | ICD-10-CM

## 2013-02-01 MED ORDER — TRAMADOL HCL 50 MG PO TABS: 50.0000 mg | ORAL_TABLET | Freq: Four times a day (QID) | ORAL | Status: DC | PRN

## 2013-02-01 MED ORDER — PANTOPRAZOLE SODIUM 40 MG PO TBEC: 40.0000 mg | DELAYED_RELEASE_TABLET | Freq: Every day | ORAL | Status: DC

## 2013-02-01 NOTE — Progress Notes (Signed)
Subjective:    Patient ID: Norma Sanchez, female    DOB: Aug 22, 1960, 52 y.o.   MRN: [id number]  HPI 52 yo never smoker with HTN, GERD, goiter / hypothyroidism. She is referred by Dr Oneta Rack / Judie Petit. [place] for persistent cough since October 2014. She reports that she had a URI at that time. She had chest tightness, cough that was minimally productive, not a lot of drainage in the beginning. She was treated for bronchitis x 3. Has used albuterol without much effect. Less bothersome at night, does happen when she talks. She denies significant heartburn, rarely takes nexium. Her voice has been hoarse x weeks.   Has been on zyrtec, fluticasone nasal spray prn (has been on for 3 weeks), robitussin, tessalon. Also on candesartan. Of note she works around [employer] in [employer], [employer].    Review of Systems  Constitutional: Negative for fever and unexpected weight change.  HENT: Positive for congestion, postnasal drip and sinus pressure. Negative for dental problem, ear pain, nosebleeds, rhinorrhea, sneezing, sore throat and trouble swallowing.   Eyes: Negative for redness and itching.  Respiratory: Positive for cough, chest tightness, shortness of breath and wheezing.   Cardiovascular: Negative for palpitations and leg swelling.  Gastrointestinal: Negative for nausea and vomiting.  Genitourinary: Negative for dysuria.  Musculoskeletal: Negative for joint swelling.  Skin: Negative for rash.  Neurological: Positive for headaches.  Hematological: Does not bruise/bleed easily.  Psychiatric/Behavioral: Negative for dysphoric mood. The patient is not nervous/anxious.    Past Medical History  Diagnosis Date  . Multinodular goiter   . High cholesterol   . Hypothyroidism   . GERD (gastroesophageal reflux disease)   . Hypertension      Family History  Problem Relation Age of Onset  . Stroke Mother   . Cancer Mother     Breast  . Stroke Father   . Heart attack Father      History   Social History   . Marital Status: Married    Spouse Name: N/A    Number of Children: N/A  . Years of Education: N/A   Occupational History  . Not on file.   Social History Main Topics  . Smoking status: Never Smoker   . Smokeless tobacco: Never Used  . Alcohol Use: No  . Drug Use: No  . Sexual Activity: Not on file   Other Topics Concern  . Not on file   Social History Narrative  . No narrative on file     Allergies  Allergen Reactions  . Avelox [Moxifloxacin Hcl In Nacl] Hives    Thrush, throat might have closed   . Hydrochlorothiazide Itching    On hands and feet  . Other Itching    All "Cillins", hives alao  . Penicillins     Hives  . Prednisone     Chest pains  . Zostavax [Zoster Vaccine Live]   . Codeine Hives, Itching and Rash  . Levothyroxine Rash    Only to generic. Not to brand synthroid.   . Sulfa Antibiotics Itching and Rash    Hands and feet itch and turn red     Outpatient Prescriptions Prior to Visit  Medication Sig Dispense Refill  . aMILoride (MIDAMOR) 5 MG tablet Take 5 mg by mouth every morning.       . Ascorbic Acid (VITAMIN C) 1000 MG tablet Take 1,000 mg by mouth daily.      Marland Kitchen aspirin 325 MG EC tablet Take 325 mg by mouth  every morning.       Marland Kitchen atorvastatin (LIPITOR) 10 MG tablet Take 10 mg by mouth every evening.       . calcium carbonate (OS-CAL) 600 MG TABS Take 600 mg by mouth daily. With vitamin D      . candesartan (ATACAND) 8 MG tablet Take 16 mg by mouth daily after lunch. TAKE IN THE PM      . Cholecalciferol (VITAMIN D-3) 5000 UNITS TABS Take 5,000 Units by mouth.      . esomeprazole (NEXIUM) 40 MG capsule Take 40 mg by mouth as needed. For acid reflux      . fluticasone (FLONASE) 50 MCG/ACT nasal spray Place 1 spray into both nostrils daily.  16 g  2  . Glucosamine-Chondroitin (GLUCOSAMINE CHONDR COMPLEX PO) Take 1,500 mg by mouth. 1500mg /1200mg   Actual dose      . levothyroxine (SYNTHROID, LEVOTHROID) 100 MCG tablet Take 100 mcg by mouth every  morning. Patient can only take brand synthroid. Take as soon as patient wakes and on an empty stomach an hour to an hour and a half before food. No vitamins for 4 hours after.      . OMEGA 3 1200 MG CAPS Take 1,200 mg by mouth.      . propranolol (INDERAL) 80 MG tablet Take 80 mg by mouth 2 (two) times daily.      Marland Kitchen HYDROcodone-acetaminophen (NORCO) 5-325 MG per tablet 1/2-1 pill PRN q 8 hours for cough  30 tablet  0  . azithromycin (ZITHROMAX Z-PAK) 250 MG tablet Take 2 tablets (500 mg) on  Day 1,  followed by 1 tablet (250 mg) once daily on Days 2 through 5.  6 each  0  . diclofenac sodium (VOLTAREN) 1 % GEL Apply 2 g topically 3 (three) times daily as needed. Apply to painful areas on knees      . fluconazole (DIFLUCAN) 150 MG tablet Take 1 tablet (150 mg total) by mouth once.  1 tablet  1  . levofloxacin (LEVAQUIN) 500 MG tablet Take 1 tablet (500 mg total) by mouth daily.  10 tablet  0   No facility-administered medications prior to visit.       Objective:   Physical Exam Filed Vitals:   02/01/13 1436  BP: 130/90  Pulse: 98  Height: 5\' 1"  (1.549 m)  Weight: 251 lb (113.853 kg)  SpO2: 95%   Gen: Pleasant, overwt woman, in no distress,  normal affect  ENT: No lesions,  mouth clear,  oropharynx clear, mild nasal congestion  Neck: No JVD, no TMG, no carotid bruits  Lungs: No use of accessory muscles,clear without rales or rhonchi  Cardiovascular: RRR, heart sounds normal, no murmur or gallops, no peripheral edema  Musculoskeletal: No deformities, no cyanosis or clubbing  Neuro: alert, non focal  Skin: Warm, no lesions or rashes     Assessment & Plan:  Chronic cough Suspect that this has been started and driven by URI's and then allergic rhinitis. Possibly GERD although she is not overtly symptomatic. Need to also r/o true asthma (although albuterol rarely helps her).  - full PFT - continue zyrtec and fluticasone - protonix qd - start ultram and delsym for cough - rov  1

## 2013-02-01 NOTE — Patient Instructions (Signed)
Please continue your zyrtec and fluticasone We will perform full pulmonary function testing  Start taking protonix 40mg  every day Take delsym for your cough  Take ultram for your cough Rest your voice for an entire weekend.  Follow with Dr Delton Coombes in 1 month with full PFT.

## 2013-02-01 NOTE — Assessment & Plan Note (Addendum)
Suspect that this has been started and driven by URI's and then allergic rhinitis. Possibly GERD although she is not overtly symptomatic. Need to also r/o true asthma (although albuterol rarely helps her).  - full PFT - continue zyrtec and fluticasone - protonix qd - start ultram and delsym for cough - rov 1

## 2013-02-13 ENCOUNTER — Other Ambulatory Visit: Payer: Self-pay | Admitting: Emergency Medicine

## 2013-03-09 ENCOUNTER — Ambulatory Visit (INDEPENDENT_AMBULATORY_CARE_PROVIDER_SITE_OTHER): Payer: 59 | Admitting: Emergency Medicine

## 2013-03-09 ENCOUNTER — Encounter: Payer: Self-pay | Admitting: Emergency Medicine

## 2013-03-09 ENCOUNTER — Other Ambulatory Visit: Payer: Self-pay | Admitting: Emergency Medicine

## 2013-03-09 VITALS — BP 130/80 | HR 73 | Ht 61.0 in | Wt 241.0 lb

## 2013-03-09 DIAGNOSIS — R059 Cough, unspecified: Secondary | ICD-10-CM

## 2013-03-09 DIAGNOSIS — R053 Chronic cough: Secondary | ICD-10-CM

## 2013-03-09 DIAGNOSIS — R05 Cough: Secondary | ICD-10-CM

## 2013-03-09 LAB — PULMONARY FUNCTION TEST
DL/VA % PRED: 120 %
DL/VA: 5.13 ml/min/mmHg/L
DLCO UNC: 18.71 ml/min/mmHg
DLCO unc % pred: 99 %
FEF 25-75 POST: 2.58 L/s
FEF 25-75 Pre: 2.89 L/sec
FEF2575-%CHANGE-POST: -10 %
FEF2575-%PRED-PRE: 120 %
FEF2575-%Pred-Post: 107 %
FEV1-%Change-Post: -2 %
FEV1-%PRED-PRE: 86 %
FEV1-%Pred-Post: 83 %
FEV1-PRE: 2.03 L
FEV1-Post: 1.98 L
FEV1FVC-%Change-Post: 1 %
FEV1FVC-%PRED-PRE: 111 %
FEV6-%CHANGE-POST: -3 %
FEV6-%PRED-POST: 76 %
FEV6-%Pred-Pre: 79 %
FEV6-Post: 2.23 L
FEV6-Pre: 2.32 L
FEV6FVC-%Pred-Post: 103 %
FEV6FVC-%Pred-Pre: 103 %
FVC-%CHANGE-POST: -3 %
FVC-%PRED-POST: 74 %
FVC-%Pred-Pre: 77 %
FVC-POST: 2.23 L
FVC-Pre: 2.32 L
POST FEV1/FVC RATIO: 89 %
POST FEV6/FVC RATIO: 100 %
PRE FEV1/FVC RATIO: 88 %
Pre FEV6/FVC Ratio: 100 %
RV % pred: 84 %
RV: 1.4 L
TLC % pred: 91 %
TLC: 4.09 L

## 2013-03-09 MED ORDER — TRAMADOL HCL 50 MG PO TABS: 50.0000 mg | ORAL_TABLET | Freq: Four times a day (QID) | ORAL | Status: DC | PRN

## 2013-03-09 NOTE — Assessment & Plan Note (Addendum)
Influenced by GERD, PND, probably now being exacerbated by another URI. Her spirometry is reassuring with no evidence for AFL.  - favor waiting a bit longer to see if rx of GERD and PND will allow the cough to resolve. URI seems to have extended it.  - consider peeling off the PPI if the cough resolves.  - if no resolution then we will need to consider further w/u, possibly FOB

## 2013-03-09 NOTE — Patient Instructions (Addendum)
Please continue to use your zyrtec and fluticasone nasal spray Please continue protonix daily. You may decide to stop this medication in the future to see if your cough returns. If so then please restart it.  Use tramadol as needed for your cough Follow with Dr Delton CoombesByrum in 6 months or sooner if you have any problems

## 2013-03-09 NOTE — Progress Notes (Signed)
PFT done today. 

## 2013-03-09 NOTE — Progress Notes (Signed)
   Subjective:    Patient ID: Norma Sanchez, female    DOB: 12/07/1960, 53 y.o.   MRN: [id number]  Cough Associated symptoms include headaches, postnasal drip, shortness of breath and wheezing. Pertinent negatives include no ear pain, eye redness, fever, rash, rhinorrhea or sore throat.   53 yo never smoker with HTN, GERD, goiter / hypothyroidism. She is referred by Dr Norma Sanchez / Judie PetitM. Katrinka Sanchez for persistent cough since October 2014. She reports that she had a URI at that time. She had chest tightness, cough that was minimally productive, not a lot of drainage in the beginning. She was treated for bronchitis x 3. Has used albuterol without much effect. Less bothersome at night, does happen when she talks. She denies significant heartburn, rarely takes nexium. Her voice has been hoarse x weeks.   Has been on zyrtec, fluticasone nasal spray prn (has been on for 3 weeks), robitussin, tessalon. Also on candesartan. Of note she works around [employer] in [employer], [employer].   ROV 03/09/13 -- follows up today for chronic cough. She improved some since last time but she still has some cough, hoarseness. She may have caught another URI, which started over our process of recovery.    Review of Systems  Constitutional: Negative for fever and unexpected weight change.  HENT: Positive for congestion, postnasal drip and sinus pressure. Negative for dental problem, ear pain, nosebleeds, rhinorrhea, sneezing, sore throat and trouble swallowing.   Eyes: Negative for redness and itching.  Respiratory: Positive for cough, chest tightness, shortness of breath and wheezing.   Cardiovascular: Negative for palpitations and leg swelling.  Gastrointestinal: Negative for nausea and vomiting.  Genitourinary: Negative for dysuria.  Musculoskeletal: Negative for joint swelling.  Skin: Negative for rash.  Neurological: Positive for headaches.  Hematological: Does not bruise/bleed easily.  Psychiatric/Behavioral: Negative for  dysphoric mood. The patient is not nervous/anxious.       Objective:   Physical Exam Filed Vitals:   03/09/13 1149  BP: 130/80  Pulse: 73  Height: 5\' 1"  (1.549 m)  Weight: 241 lb (109.317 kg)  SpO2: 96%   Gen: Pleasant, overwt woman, in no distress,  normal affect  ENT: No lesions,  mouth clear,  oropharynx clear, mild nasal congestion  Neck: No JVD, no TMG, no carotid bruits  Lungs: No use of accessory muscles,clear without rales or rhonchi  Cardiovascular: RRR, heart sounds normal, no murmur or gallops, no peripheral edema  Musculoskeletal: No deformities, no cyanosis or clubbing  Neuro: alert, non focal  Skin: Warm, no lesions or rashes     Assessment & Plan:  Chronic cough Influenced by GERD, PND, probably now being exacerbated by another URI. Her spirometry is reassuring with no evidence for AFL.  - favor waiting a bit longer to see if rx of GERD and PND will allow the cough to resolve. URI seems to have extended it.  - consider peeling off the PPI if the cough resolves.  - if no resolution then we will need to consider further w/u, possibly FOB

## 2013-03-13 ENCOUNTER — Institutional Professional Consult (permissible substitution): Payer: Commercial Managed Care - PPO | Admitting: Internal Medicine

## 2013-03-23 ENCOUNTER — Other Ambulatory Visit: Payer: Self-pay | Admitting: Emergency Medicine

## 2013-03-23 ENCOUNTER — Encounter: Payer: Self-pay | Admitting: Emergency Medicine

## 2013-03-23 ENCOUNTER — Ambulatory Visit (INDEPENDENT_AMBULATORY_CARE_PROVIDER_SITE_OTHER): Payer: Commercial Managed Care - PPO | Admitting: Emergency Medicine

## 2013-03-23 VITALS — BP 110/64 | HR 66 | Temp 98.0°F | Resp 16 | Ht 60.5 in | Wt 242.0 lb

## 2013-03-23 DIAGNOSIS — I10 Essential (primary) hypertension: Secondary | ICD-10-CM

## 2013-03-23 DIAGNOSIS — L919 Hypertrophic disorder of the skin, unspecified: Secondary | ICD-10-CM

## 2013-03-23 DIAGNOSIS — E782 Mixed hyperlipidemia: Secondary | ICD-10-CM

## 2013-03-23 DIAGNOSIS — R7309 Other abnormal glucose: Secondary | ICD-10-CM

## 2013-03-23 DIAGNOSIS — E669 Obesity, unspecified: Secondary | ICD-10-CM

## 2013-03-23 DIAGNOSIS — L909 Atrophic disorder of skin, unspecified: Secondary | ICD-10-CM

## 2013-03-23 LAB — HEMOGLOBIN A1C
HEMOGLOBIN A1C: 5.8 % — AB (ref <5.7)
Mean Plasma Glucose: 120 mg/dL — ABNORMAL HIGH (ref <117)

## 2013-03-23 NOTE — Progress Notes (Signed)
Subjective:    Patient ID: Norma Sanchez, female    DOB: [birth year], 53 y.o.   MRN: [id number]  HPI Comments: 53 yo obese female presents for 3 month F/U for HTN, Cholesterol, Pre-Dm, D. deficient LAST LABS T 170 TG 139 H 61 L 81 MAG 1.9 A1C 6.2 D 99 INSULIN 28 She has increased Metformin to 2 a day and has done well. Dr Sharl Ma increased Synthroid at last ov to try to help with weight loss. She is not exercising but has increased activity. She is trying to decrease portions and improve diet but still has ice cream QHS.   She has skin change on right forearm x over several months. She notes area is scaling and erythematous and occasionally itches. She notes it appears raised on/off. She would like area removed.   Current Outpatient Prescriptions on File Prior to Visit  Medication Sig Dispense Refill  . albuterol (PROVENTIL HFA;VENTOLIN HFA) 108 (90 BASE) MCG/ACT inhaler Inhale 2 puffs into the lungs every 6 (six) hours as needed for wheezing or shortness of breath.      Marland Kitchen aMILoride (MIDAMOR) 5 MG tablet Take 5 mg by mouth every morning.       . Ascorbic Acid (VITAMIN C) 1000 MG tablet Take 1,000 mg by mouth daily.      Marland Kitchen aspirin 325 MG EC tablet Take 325 mg by mouth every morning.       Marland Kitchen atorvastatin (LIPITOR) 10 MG tablet Take 10 mg by mouth every evening.       . candesartan (ATACAND) 8 MG tablet Take 16 mg by mouth daily after lunch. TAKE IN THE PM      . cetirizine (ZYRTEC) 10 MG tablet Take 10 mg by mouth daily.      . fluticasone (FLONASE) 50 MCG/ACT nasal spray Place 1 spray into both nostrils daily.  16 g  2  . Glucosamine-Chondroitin (GLUCOSAMINE CHONDR COMPLEX PO) Take 1,500 mg by mouth. 1500mg /1200mg   Actual dose      . OMEGA 3 1200 MG CAPS Take 1,200 mg by mouth.      . pantoprazole (PROTONIX) 40 MG tablet Take 1 tablet (40 mg total) by mouth daily.  30 tablet  11  . propranolol (INDERAL) 80 MG tablet TAKE 1 TABLET BY MOUTH TWICE DAILY  180 tablet  1  . traMADol (ULTRAM) 50 MG  tablet Take 1 tablet (50 mg total) by mouth every 6 (six) hours as needed (cough).  40 tablet  0  . traMADol (ULTRAM) 50 MG tablet Take 1 tablet (50 mg total) by mouth every 6 (six) hours as needed.  30 tablet  0  . calcium carbonate (OS-CAL) 600 MG TABS Take 600 mg by mouth daily. With vitamin D      . Cholecalciferol (VITAMIN D-3) 5000 UNITS TABS Take 5,000 Units by mouth.      . Dextromethorphan-Guaifenesin (CORICIDIN HBP CONGESTION/COUGH) 10-200 MG CAPS Take 2 tablets by mouth 2 (two) times daily.      Marland Kitchen esomeprazole (NEXIUM) 40 MG capsule Take 40 mg by mouth as needed. For acid reflux      . guaifenesin (ROBITUSSIN) 100 MG/5ML syrup Take 200 mg by mouth 3 (three) times daily as needed for cough.       No current facility-administered medications on file prior to visit.   ALLERGIES .Avelox; Hydrochlorothiazide; Other; Penicillins; Prednisone; Zostavax; Codeine; Levothyroxine; and Sulfa antibiotics  Past Medical History  Diagnosis Date  . Multinodular goiter   . High  cholesterol   . Hypothyroidism   . GERD (gastroesophageal reflux disease)   . Hypertension      Review of Systems  Skin: Positive for color change.  All other systems reviewed and are negative.   BP 110/64  Pulse 66  Temp(Src) 98 F (36.7 C) (Temporal)  Resp 16  Ht 5' 0.5" (1.537 m)  Wt 242 lb (109.77 kg)  BMI 46.47 kg/m2     Objective:   Physical Exam  Nursing note and vitals reviewed. Constitutional: She is oriented to person, place, and time. She appears well-developed and well-nourished. No distress.  obese  HENT:  Head: Normocephalic and atraumatic.  Right Ear: External ear normal.  Left Ear: External ear normal.  Nose: Nose normal.  Mouth/Throat: Oropharynx is clear and moist.  Eyes: Conjunctivae and EOM are normal.  Neck: Normal range of motion. Neck supple. No JVD present. No thyromegaly present.  Cardiovascular: Normal rate, regular rhythm, normal heart sounds and intact distal pulses.    Pulmonary/Chest: Effort normal and breath sounds normal.  Abdominal: Soft. Bowel sounds are normal. She exhibits no distension and no mass. There is no tenderness. There is no rebound and no guarding.  Musculoskeletal: Normal range of motion. She exhibits no edema and no tenderness.  Lymphadenopathy:    She has no cervical adenopathy.  Neurological: She is alert and oriented to person, place, and time. No cranial nerve deficit.  Skin: Skin is warm and dry. No rash noted. No erythema. No pallor.  Right mid forearm 4 mm erythematous scaling with mild elevation  Psychiatric: She has a normal mood and affect. Her behavior is normal. Judgment and thought content normal.          Assessment & Plan:  1.  3 month F/U for obesity, HTN, Cholesterol, Pre-Dm, D. Deficient. Needs healthy diet, cardio QD and obtain healthy weight. Check Labs, Check BP if >130/80 call office Increase MF to 3 pills a day 2. Hypertrophic skin change on Right forearm- Verbal permission obtained. Area prepped and shave excision performed with #10 blade knife with clear borders and sent to pathology for review. Area was prepped with alcohol and 1% Lidocaine x 2cc used. Bleeding controlled with cauterization. Neosporin/ guaze/ paper tape applied. Wound hygiene explained.  OVER 40 minutes of exam, procedure, counseling, chart review, referral performed for pathology

## 2013-03-23 NOTE — Patient Instructions (Signed)
Diabetes Meal Planning Guide The diabetes meal planning guide is a tool to help you plan your meals and snacks. It is important for people with diabetes to manage their blood glucose (sugar) levels. Choosing the right foods and the right amounts throughout your day will help control your blood glucose. Eating right can even help you improve your blood pressure and reach or maintain a healthy weight. CARBOHYDRATE COUNTING MADE EASY When you eat carbohydrates, they turn to sugar. This raises your blood glucose level. Counting carbohydrates can help you control this level so you feel better. When you plan your meals by counting carboWound Care Wound care helps prevent pain and infection.  You may need a tetanus shot if:  You cannot remember when you had your last tetanus shot.  You have never had a tetanus shot.  The injury broke your skin. If you need a tetanus shot and you choose not to have one, you may get tetanus. Sickness from tetanus can be serious. HOME CARE   Only take medicine as told by your doctor.  Clean the wound daily with mild soap and water.  Change any bandages (dressings) as told by your doctor.  Put medicated cream and a bandage on the wound as told by your doctor.  Change the bandage if it gets wet, dirty, or starts to smell.  Take showers. Do not take baths, swim, or do anything that puts your wound under water.  Rest and raise (elevate) the wound until the pain and puffiness (swelling) are better.  Keep all doctor visits as told. GET HELP RIGHT AWAY IF:   Yellowish-white fluid (pus) comes from the wound.  Medicine does not lessen your pain.  There is a red streak going away from the wound.  You have a fever. MAKE SURE YOU:   Understand these instructions.  Will watch your condition.  Will get help right away if you are not doing well or get worse. Document Released: 11/18/2007 Document Revised: 05/03/2011 Document Reviewed: 06/14/2010 [facility]  Patient Information 2014 [place], [place]. hydrates, you can have more flexibility in what you eat and balance your medicine with your food intake. Carbohydrate counting simply means adding up the total amount of carbohydrate grams in your meals and snacks. Try to eat about the same amount at each meal. Foods with carbohydrates are listed below. Each portion below is 1 carbohydrate serving or 15 grams of carbohydrates. Ask your dietician how many grams of carbohydrates you should eat at each meal or snack. Grains and Starches  1 slice bread.   English muffin or hotdog/hamburger bun.   cup cold cereal (unsweetened).   cup cooked pasta or rice.   cup starchy vegetables (corn, potatoes, peas, beans, winter squash).  1 tortilla (6 inches).   bagel.  1 waffle or pancake (size of a CD).   cup cooked cereal.  4 to 6 small crackers. *Whole grain is recommended. Fruit  1 cup fresh unsweetened berries, melon, papaya, pineapple.  1 small fresh fruit.   banana or mango.   cup fruit juice (4 oz unsweetened).   cup canned fruit in natural juice or water.  2 tbs dried fruit.  12 to 15 grapes or cherries. Milk and Yogurt  1 cup fat-free or 1% milk.  1 cup soy milk.  6 oz light yogurt with sugar-free sweetener.  6 oz low-fat soy yogurt.  6 oz plain yogurt. Vegetables  1 cup raw or  cup cooked is counted as 0 carbohydrates or a "free"  food.  If you eat 3 or more servings at 1 meal, count them as 1 carbohydrate serving. Other Carbohydrates   oz chips or pretzels.   cup ice cream or frozen yogurt.   cup sherbet or sorbet.  2 inch square cake, no frosting.  1 tbs honey, sugar, jam, jelly, or syrup.  2 small cookies.  3 squares of graham crackers.  3 cups popcorn.  6 crackers.  1 cup broth-based soup.  Count 1 cup casserole or other mixed foods as 2 carbohydrate servings.  Foods with less than 20 calories in a serving may be counted as 0  carbohydrates or a "free" food. You may want to purchase a book or computer software that lists the carbohydrate gram counts of different foods. In addition, the nutrition facts panel on the labels of the foods you eat are a good source of this information. The label will tell you how big the serving size is and the total number of carbohydrate grams you will be eating per serving. Divide this number by 15 to obtain the number of carbohydrate servings in a portion. Remember, 1 carbohydrate serving equals 15 grams of carbohydrate. SERVING SIZES Measuring foods and serving sizes helps you make sure you are getting the right amount of food. The list below tells how big or small some common serving sizes are.  1 oz.........4 stacked dice.  3 oz........Marland Kitchen.Deck of cards.  1 tsp.......Marland Kitchen.Tip of little finger.  1 tbs......Marland Kitchen.Marland Kitchen.Thumb.  2 tbs.......Marland Kitchen.Golf ball.   cup......Marland Kitchen.Half of a fist.  1 cup.......Marland Kitchen.A fist. SAMPLE DIABETES MEAL PLAN Below is a sample meal plan that includes foods from the grain and starches, dairy, vegetable, fruit, and meat groups. A dietician can individualize a meal plan to fit your calorie needs and tell you the number of servings needed from each food group. However, controlling the total amount of carbohydrates in your meal or snack is more important than making sure you include all of the food groups at every meal. You may interchange carbohydrate containing foods (dairy, starches, and fruits). The meal plan below is an example of a 2000 calorie diet using carbohydrate counting. This meal plan has 17 carbohydrate servings. Breakfast  1 cup oatmeal (2 carb servings).   cup light yogurt (1 carb serving).  1 cup blueberries (1 carb serving).   cup almonds. Snack  1 large apple (2 carb servings).  1 low-fat string cheese stick. Lunch  Chicken breast salad.  1 cup spinach.   cup chopped tomatoes.  2 oz chicken breast, sliced.  2 tbs low-fat [place] dressing.  12  whole-wheat crackers (2 carb servings).  12 to 15 grapes (1 carb serving).  1 cup low-fat milk (1 carb serving). Snack  1 cup carrots.   cup hummus (1 carb serving). Dinner  3 oz broiled salmon.  1 cup brown rice (3 carb servings). Snack  1  cups steamed broccoli (1 carb serving) drizzled with 1 tsp olive oil and lemon juice.  1 cup light pudding (2 carb servings). DIABETES MEAL PLANNING WORKSHEET Your dietician can use this worksheet to help you decide how many servings of foods and what types of foods are right for you.  BREAKFAST Food Group and Servings / Carb Servings Grain/Starches __________________________________ Dairy __________________________________________ Vegetable ______________________________________ Fruit ___________________________________________ Meat __________________________________________ Fat ____________________________________________ LUNCH Food Group and Servings / Carb Servings Grain/Starches ___________________________________ Dairy ___________________________________________ Fruit ____________________________________________ Meat ___________________________________________ Fat _____________________________________________ Laural GoldenINNER Food Group and Servings / Carb Servings Grain/Starches ___________________________________ Dairy ___________________________________________ Fruit ____________________________________________ Meat ___________________________________________  Fat _____________________________________________ SNACKS Food Group and Servings / Carb Servings Grain/Starches ___________________________________ Dairy ___________________________________________ Vegetable _______________________________________ Fruit ____________________________________________ Meat ___________________________________________ Fat _____________________________________________ DAILY TOTALS Starches _________________________ Vegetable  ________________________ Fruit ____________________________ Dairy ____________________________ Meat ____________________________ Fat ______________________________ Document Released: 11/05/2004 Document Revised: 05/03/2011 Document Reviewed: 09/16/2008 [employer] Patient Information 2014 [place], [place]. Diabetes and Exercise Exercising regularly is important. It is not just about losing weight. It has many health benefits, such as:  Improving your overall fitness, flexibility, and endurance.  Increasing your bone density.  Helping with weight control.  Decreasing your body fat.  Increasing your muscle strength.  Reducing stress and tension.  Improving your overall health. People with diabetes who exercise gain additional benefits because exercise:  Reduces appetite.  Improves the body's use of blood sugar (glucose).  Helps lower or control blood glucose.  Decreases blood pressure.  Helps control blood lipids (such as cholesterol and triglycerides).  Improves the body's use of the hormone insulin by:  Increasing the body's insulin sensitivity.  Reducing the body's insulin needs.  Decreases the risk for heart disease because exercising:  Lowers cholesterol and triglycerides levels.  Increases the levels of good cholesterol (such as high-density lipoproteins [HDL]) in the body.  Lowers blood glucose levels. YOUR ACTIVITY PLAN  Choose an activity that you enjoy and set realistic goals. Your health care provider or diabetes educator can help you make an activity plan that works for you. You can break activities into 2 or 3 sessions throughout the day. Doing so is as good as one long session. Exercise ideas include:  Taking the dog for a walk.  Taking the stairs instead of the elevator.  Dancing to your favorite song.  Doing your favorite exercise with a friend. RECOMMENDATIONS FOR EXERCISING WITH TYPE 1 OR TYPE 2 DIABETES   Check your blood glucose before  exercising. If blood glucose levels are greater than 240 mg/dL, check for urine ketones. Do not exercise if ketones are present.  Avoid injecting insulin into areas of the body that are going to be exercised. For example, avoid injecting insulin into:  The arms when playing tennis.  The legs when jogging.  Keep a record of:  Food intake before and after you exercise.  Expected peak times of insulin action.  Blood glucose levels before and after you exercise.  The type and amount of exercise you have done.  Review your records with your health care provider. Your health care provider will help you to develop guidelines for adjusting food intake and insulin amounts before and after exercising.  If you take insulin or oral hypoglycemic agents, watch for signs and symptoms of hypoglycemia. They include:  Dizziness.  Shaking.  Sweating.  Chills.  Confusion.  Drink plenty of water while you exercise to prevent dehydration or heat stroke. Body water is lost during exercise and must be replaced.  Talk to your health care provider before starting an exercise program to make sure it is safe for you. Remember, almost any type of activity is better than none. Document Released: 05/01/2003 Document Revised: 10/11/2012 Document Reviewed: 07/18/2012 [facility] Patient Information 2014 [place], [place].

## 2013-03-24 LAB — LIPID PANEL
Cholesterol: 146 mg/dL (ref 0–200)
HDL: 51 mg/dL (ref >39)
LDL Cholesterol: 73 mg/dL (ref 0–99)
Total CHOL/HDL Ratio: 2.9 Ratio
Triglycerides: 109 mg/dL (ref <150)
VLDL: 22 mg/dL (ref 0–40)

## 2013-03-24 LAB — BASIC METABOLIC PANEL WITH GFR
BUN: 16 mg/dL (ref 6–23)
CHLORIDE: 104 meq/L (ref 96–112)
CO2: 29 meq/L (ref 19–32)
Calcium: 10 mg/dL (ref 8.4–10.5)
Creat: 0.7 mg/dL (ref 0.50–1.10)
GFR, Est African American: >89 mL/min
GFR, Est Non African American: >89 mL/min
Glucose, Bld: 106 mg/dL — ABNORMAL HIGH (ref 70–99)
Potassium: 4.3 mEq/L (ref 3.5–5.3)
Sodium: 142 mEq/L (ref 135–145)

## 2013-03-24 LAB — CBC WITH DIFFERENTIAL/PLATELET
Basophils Absolute: 0.1 10*3/uL (ref 0.0–0.1)
Basophils Relative: 1 % (ref 0–1)
Eosinophils Absolute: 0.7 10*3/uL (ref 0.0–0.7)
Eosinophils Relative: 7 % — ABNORMAL HIGH (ref 0–5)
HCT: 41.5 % (ref 36.0–46.0)
HEMOGLOBIN: 13.9 g/dL (ref 12.0–15.0)
LYMPHS ABS: 3.2 10*3/uL (ref 0.7–4.0)
Lymphocytes Relative: 31 % (ref 12–46)
MCH: 29.5 pg (ref 26.0–34.0)
MCHC: 33.5 g/dL (ref 30.0–36.0)
MCV: 88.1 fL (ref 78.0–100.0)
MONOS PCT: 7 % (ref 3–12)
Monocytes Absolute: 0.7 10*3/uL (ref 0.1–1.0)
NEUTROS PCT: 54 % (ref 43–77)
Neutro Abs: 5.6 10*3/uL (ref 1.7–7.7)
Platelets: 317 10*3/uL (ref 150–400)
RBC: 4.71 MIL/uL (ref 3.87–5.11)
RDW: 14 % (ref 11.5–15.5)
WBC: 10.3 10*3/uL (ref 4.0–10.5)

## 2013-03-24 LAB — HEPATIC FUNCTION PANEL
ALT: 22 U/L (ref 0–35)
AST: 18 U/L (ref 0–37)
Albumin: 4.3 g/dL (ref 3.5–5.2)
Alkaline Phosphatase: 72 U/L (ref 39–117)
BILIRUBIN DIRECT: 0.1 mg/dL (ref 0.0–0.3)
BILIRUBIN INDIRECT: 0.3 mg/dL (ref 0.2–1.2)
BILIRUBIN TOTAL: 0.4 mg/dL (ref 0.2–1.2)
Total Protein: 6.9 g/dL (ref 6.0–8.3)

## 2013-03-24 LAB — INSULIN, FASTING: INSULIN FASTING, SERUM: 49 u[IU]/mL — AB (ref 3–28)

## 2013-03-28 NOTE — Progress Notes (Signed)
LMOM TO CALL TO SCHEDULE A 4 MTH

## 2013-04-05 ENCOUNTER — Other Ambulatory Visit: Payer: Self-pay | Admitting: Physician Assistant

## 2013-04-05 MED ORDER — PAROXETINE HCL 20 MG PO TABS: 20.0000 mg | ORAL_TABLET | Freq: Every day | ORAL | Status: DC

## 2013-04-23 NOTE — Progress Notes (Signed)
LMOM TO CALL TO SCHEDULE  

## 2013-05-08 ENCOUNTER — Other Ambulatory Visit: Payer: Self-pay | Admitting: Physician Assistant

## 2013-05-08 NOTE — Telephone Encounter (Signed)
PT CALLED TO REQUEST 90 DAY SUPPLY OF PAXIL 20MG  QD, INSTEAD OF CURRENT RX SENT IN BY AMAMNDA COLLIER FOR 30 DAYS. [place] [facility]. PLEASE ADVISE PT. [phone]

## 2013-05-09 ENCOUNTER — Other Ambulatory Visit: Payer: Self-pay | Admitting: Emergency Medicine

## 2013-05-09 MED ORDER — PAROXETINE HCL 20 MG PO TABS: ORAL_TABLET | ORAL | Status: DC

## 2013-05-09 NOTE — Telephone Encounter (Signed)
LMOM TO CALL  

## 2013-05-16 ENCOUNTER — Other Ambulatory Visit: Payer: Self-pay | Admitting: Emergency Medicine

## 2013-05-16 MED ORDER — AMILORIDE HCL 5 MG PO TABS: 5.0000 mg | ORAL_TABLET | Freq: Every morning | ORAL | Status: DC

## 2013-05-16 MED ORDER — METFORMIN HCL ER 500 MG PO TB24: ORAL_TABLET | ORAL | Status: DC

## 2013-05-16 MED ORDER — METFORMIN HCL ER 500 MG PO TB24: 500.0000 mg | ORAL_TABLET | Freq: Three times a day (TID) | ORAL | Status: DC

## 2013-05-16 NOTE — Telephone Encounter (Signed)
PT REQUESTING NEW RX SENT TO [place] [facility]  FOR METFORMIN, YOU CHANGED THE DOSE TO 500MG  TID.    ALSO NEEDS REFILL ON AMILORIDE 5MG  QD  THANKS  KAT

## 2013-05-22 NOTE — Telephone Encounter (Signed)
LMOM TO CALL & SCHEDULE  

## 2013-06-15 ENCOUNTER — Ambulatory Visit (INDEPENDENT_AMBULATORY_CARE_PROVIDER_SITE_OTHER): Payer: Commercial Managed Care - PPO | Admitting: Emergency Medicine

## 2013-06-15 ENCOUNTER — Encounter: Payer: Self-pay | Admitting: Emergency Medicine

## 2013-06-15 VITALS — BP 132/80 | HR 64 | Temp 98.2°F | Resp 18 | Wt 239.0 lb

## 2013-06-15 DIAGNOSIS — M25519 Pain in unspecified shoulder: Secondary | ICD-10-CM

## 2013-06-15 DIAGNOSIS — I10 Essential (primary) hypertension: Secondary | ICD-10-CM

## 2013-06-15 DIAGNOSIS — E782 Mixed hyperlipidemia: Secondary | ICD-10-CM

## 2013-06-15 DIAGNOSIS — R7309 Other abnormal glucose: Secondary | ICD-10-CM

## 2013-06-15 DIAGNOSIS — M25512 Pain in left shoulder: Secondary | ICD-10-CM

## 2013-06-15 LAB — HEMOGLOBIN A1C
Hgb A1c MFr Bld: 6.2 % — ABNORMAL HIGH (ref <5.7)
Mean Plasma Glucose: 131 mg/dL — ABNORMAL HIGH (ref <117)

## 2013-06-15 LAB — CBC WITH DIFFERENTIAL/PLATELET
BASOS ABS: 0.1 10*3/uL (ref 0.0–0.1)
Basophils Relative: 1 % (ref 0–1)
EOS ABS: 0.5 10*3/uL (ref 0.0–0.7)
Eosinophils Relative: 5 % (ref 0–5)
HCT: 40.1 % (ref 36.0–46.0)
Hemoglobin: 13.9 g/dL (ref 12.0–15.0)
Lymphocytes Relative: 29 % (ref 12–46)
Lymphs Abs: 2.9 10*3/uL (ref 0.7–4.0)
MCH: 28.6 pg (ref 26.0–34.0)
MCHC: 34.7 g/dL (ref 30.0–36.0)
MCV: 82.5 fL (ref 78.0–100.0)
Monocytes Absolute: 0.7 10*3/uL (ref 0.1–1.0)
Monocytes Relative: 7 % (ref 3–12)
NEUTROS PCT: 58 % (ref 43–77)
Neutro Abs: 5.8 10*3/uL (ref 1.7–7.7)
PLATELETS: 281 10*3/uL (ref 150–400)
RBC: 4.86 MIL/uL (ref 3.87–5.11)
RDW: 13.9 % (ref 11.5–15.5)
WBC: 10 10*3/uL (ref 4.0–10.5)

## 2013-06-15 NOTE — Progress Notes (Signed)
Subjective:    Patient ID: Norma Sanchez, female    DOB: 09/01/1960, 53 y.o.   MRN: [id number]  HPI Comments: 53 yo WF presents for 3 month F/U for HTN, Cholesterol, PRE-DM, D. Deficient. She is not eating healthy. She is not exercising routinely. She does not check BS. She notes BP good at home. She Notes she was placed on Metformin to help with weight loss denies DM diagnosis. She would prefer to oly have OV Q 6 month due to cost. She has had mild stress increase but seems to be managing, declines RX change. Last labs  HGBA1C      5.8   03/23/2013 CHOL         146   03/23/2013 HDL           51   03/23/2013 LDLCALC       73   03/23/2013 TRIG         109   03/23/2013 CHOLHDL      2.9   03/23/2013 ALT           22   03/23/2013 AST           18   03/23/2013 ALKPHOS       72   03/23/2013 BILITOT      0.4   03/23/2013 CREATININE     0.70   03/23/2013 BUN              16   03/23/2013 NA              142   03/23/2013 K               4.3   03/23/2013 CL              104   03/23/2013 CO2              29   03/23/2013 WBC     10.3   03/23/2013 HGB     13.9   03/23/2013 HCT     41.5   03/23/2013 MCV     88.1   03/23/2013 PLT      317   03/23/2013  She notes mild left shoulder discomfort without obvious injury. She notes pain worse when tries to raise arm above shoulder height.  Hyperlipidemia      Medication List       This list is accurate as of: 06/15/13 10:27 AM.  Always use your most recent med list.               albuterol 108 (90 BASE) MCG/ACT inhaler  Commonly known as:  PROVENTIL HFA;VENTOLIN HFA  Inhale 2 puffs into the lungs every 6 (six) hours as needed for wheezing or shortness of breath.     aMILoride 5 MG tablet  Commonly known as:  MIDAMOR  Take 1 tablet (5 mg total) by mouth every morning.     aspirin 325 MG EC tablet  Take 325 mg by mouth every morning.     atorvastatin 10 MG tablet  Commonly known as:  LIPITOR  Take 10 mg by mouth every evening.     candesartan 8 MG  tablet  Commonly known as:  ATACAND  Take 16 mg by mouth daily after lunch. TAKE IN THE PM     CENTRUM ULTRA WOMENS PO  Take by mouth daily.     cetirizine 10 MG tablet  Commonly known as:  ZYRTEC  Take 10 mg by mouth  daily.     CORICIDIN HBP CONGESTION/COUGH 10-200 MG Caps  Generic drug:  Dextromethorphan-Guaifenesin  Take 2 tablets by mouth 2 (two) times daily.     famciclovir 500 MG tablet  Commonly known as:  FAMVIR  Take 500 mg by mouth 3 (three) times daily. Take AD for fever blister     fluconazole 150 MG tablet  Commonly known as:  DIFLUCAN  Take 150 mg by mouth once a week. PRN yeast     fluticasone 50 MCG/ACT nasal spray  Commonly known as:  FLONASE  Place 1 spray into both nostrils daily.     GLUCOSAMINE CHONDR COMPLEX PO  Take 1,500 mg by mouth. 1500mg /1200mg   Actual dose     guaifenesin 100 MG/5ML syrup  Commonly known as:  ROBITUSSIN  Take 200 mg by mouth 3 (three) times daily as needed for cough.     levothyroxine 137 MCG tablet  Commonly known as:  SYNTHROID, LEVOTHROID  Take 137 mcg by mouth daily before breakfast.     metFORMIN 500 MG 24 hr tablet  Commonly known as:  GLUCOPHAGE-XR  Take 500 mg by mouth 3 (three) times daily.     pantoprazole 40 MG tablet  Commonly known as:  PROTONIX  Take 1 tablet (40 mg total) by mouth daily.     PARoxetine 20 MG tablet  Commonly known as:  PAXIL  TAKE 1 TABLET BY MOUTH DAILY.     propranolol 80 MG tablet  Commonly known as:  INDERAL  TAKE 1 TABLET BY MOUTH TWICE DAILY     traMADol 50 MG tablet  Commonly known as:  ULTRAM  Take 1 tablet (50 mg total) by mouth every 6 (six) hours as needed (cough).     traMADol 50 MG tablet  Commonly known as:  ULTRAM  Take 1 tablet (50 mg total) by mouth every 6 (six) hours as needed.     vitamin C 1000 MG tablet  Take 1,000 mg by mouth daily.       Allergies  Allergen Reactions  . Avelox [Moxifloxacin Hcl In Nacl] Hives    Thrush, throat might have closed    . Hydrochlorothiazide Itching    On hands and feet  . Other Itching    All "Cillins", hives alao  . Penicillins     Hives  . Prednisone     Chest pains  . Zostavax [Zoster Vaccine Live]   . Codeine Hives, Itching and Rash  . Levothyroxine Rash    Only to generic. Not to brand synthroid.   . Sulfa Antibiotics Itching and Rash    Hands and feet itch and turn red   Past Medical History  Diagnosis Date  . Multinodular goiter   . High cholesterol   . Hypothyroidism   . GERD (gastroesophageal reflux disease)   . Hypertension      Review of Systems  All other systems reviewed and are negative.  BP 132/80  Pulse 64  Temp(Src) 98.2 F (36.8 C) (Temporal)  Resp 18  Wt 239 lb (108.41 kg)     Objective:   Physical Exam  Nursing note and vitals reviewed. Constitutional: She is oriented to person, place, and time. She appears well-developed and well-nourished. No distress.  HENT:  Head: Normocephalic and atraumatic.  Right Ear: External ear normal.  Left Ear: External ear normal.  Nose: Nose normal.  Mouth/Throat: Oropharynx is clear and moist.  Eyes: Conjunctivae and EOM are normal.  Neck: Normal range of motion. Neck supple.  No JVD present. No thyromegaly present.  Cardiovascular: Normal rate, regular rhythm, normal heart sounds and intact distal pulses.   Pulmonary/Chest: Effort normal and breath sounds normal.  Abdominal: Soft. Bowel sounds are normal. She exhibits no distension and no mass. There is no tenderness. There is no rebound and no guarding.  Musculoskeletal: Normal range of motion. She exhibits no edema and no tenderness.  Lymphadenopathy:    She has no cervical adenopathy.  Neurological: She is alert and oriented to person, place, and time. No cranial nerve deficit.  Skin: Skin is warm and dry. No rash noted. No erythema. No pallor.  Psychiatric: She has a normal mood and affect. Her behavior is normal. Judgment and thought content normal.           Assessment & Plan:  1.  3 month F/U for HTN, Cholesterol, Pre-Dm, D. Deficient. Needs healthy diet, cardio QD and obtain healthy weight. Check Labs, Check BP if >130/80 call office   2. Left shoulder pain- stretches explained, dclines xray will f/u ORtho if no change

## 2013-06-15 NOTE — Patient Instructions (Addendum)
Bad carbs also include fruit juice, alcohol, and sweet tea. These are empty calories that do not signal to your brain that you are full.   Please remember the good carbs are still carbs which convert into sugar. So please measure them out no more than 1/2-1 cup of rice, oatmeal, pasta, and beans.  Veggies are however free foods! Pile them on.   I like lean protein at every meal such as chicken, [place]key, pork chops, cottage cheese, etc. Just do not fry these meats and please center your meal around vegetable, the meats should be a side dish.   No all fruit is created equal. Please see the list below, the fruit at the bottom is higher in sugars than the fruit at the top   We want weight loss that will last so you should lose 1-2 pounds a week.  THAT IS IT! Please pick THREE things a month to change. Once it is a habit check off the item. Then pick another three items off the list to become habits.  If you are already doing a habit on the list GREAT!  Cross that item off! o Don't drink your calories. Ie, alcohol, soda, fruit juice, and sweet tea.  o Drink more water. Drink a glass when you feel hungry or before each meal.  o Eat breakfast - Complex carb and protein (likeDannon light and fit yogurt, oatmeal, fruit, eggs, [place]urkey bacon). o Measure your cereal.  Eat no more than one cup a day. (ie [place]ashi) o Eat an apple a day. o Add a vegetable a day. o Try a new vegetable a month. o Use Pam! Stop using oil or butter to cook. o Don't finish your plate or use smaller plates. o Share your dessert. o Eat sugar free Jello for dessert or frozen grapes. o Don't eat 2-3 hours before bed. o Switch to whole wheat bread, pasta, and brown rice. o Make healthier choices when you eat out. No fries! o Pick baked chicken, NOT fried. o Don't forget to SLOW DOWN when you eat. It is not going anywhere.  o Take the stairs. o Park far away in the parking lot o [employer] Farm[employer] soup cans (or weights) for 10 minutes while  watching TV. o Walk at work for 10 minutes during break. o Walk outside 1 time a week with your friend, kids, dog, or significant other. o Start a walking group at church. o Walk the mall as much as you can tolerate.  o Keep a food diary. o Weigh yourself daily. o Walk for 15 minutes 3 days per week. o Cook at home more often and eat out less.  If life happens and you go back to old habits, it is okay.  Just start over. You can do it!   If you experience chest pain, get short of breath, or tired during the exercise, please stop immediately and inform your doctor.    Shoulder Exercises EXERCISES  RANGE OF MOTION (ROM) AND STRETCHING EXERCISES These exercises may help you when beginning to rehabilitate your injury. Your symptoms may resolve with or without further involvement from your physician, physical therapist or athletic trainer. While completing these exercises, remember:   Restoring tissue flexibility helps normal motion to return to the joints. This allows healthier, less painful movement and activity.  An effective stretch should be held for at least 30 seconds.  A stretch should never be painful. You should only feel a gentle lengthening or release in the stretched tissue.  ROM - Pendulum  Bend at the waist so that your right / left arm falls away from your body. Support yourself with your opposite hand on a solid surface, such as a table or a countertop.  Your right / left arm should be perpendicular to the ground. If it is not perpendicular, you need to lean over farther. Relax the muscles in your right / left arm and shoulder as much as possible.  Gently sway your hips and trunk so they move your right / left arm without any use of your right / left shoulder muscles.  Progress your movements so that your right / left arm moves side to side, then forward and backward, and finally, both clockwise and counterclockwise.  Complete __________ repetitions in each direction. Many  people use this exercise to relieve discomfort in their shoulder as well as to gain range of motion. Repeat __________ times. Complete this exercise __________ times per day. STRETCH  Flexion, Standing  Stand with good posture. With an underhand grip on your right / left hand and an overhand grip on the opposite hand, grasp a broomstick or cane so that your hands are a little more than shoulder-width apart.  Keeping your right / left elbow straight and shoulder muscles relaxed, push the stick with your opposite hand to raise your right / left arm in front of your body and then overhead. Raise your arm until you feel a stretch in your right / left shoulder, but before you have increased shoulder pain.  Try to avoid shrugging your right / left shoulder as your arm rises by keeping your shoulder blade tucked down and toward your mid-back spine. Hold __________ seconds.  Slowly return to the starting position. Repeat __________ times. Complete this exercise __________ times per day. STRETCH - Internal Rotation  Place your right / left hand behind your back, palm-up.  Throw a towel or belt over your opposite shoulder. Grasp the towel/belt with your right / left hand.  While keeping an upright posture, gently pull up on the towel/belt until you feel a stretch in the front of your right / left shoulder.  Avoid shrugging your right / left shoulder as your arm rises by keeping your shoulder blade tucked down and toward your mid-back spine.  Hold __________. Release the stretch by lowering your opposite hand. Repeat __________ times. Complete this exercise __________ times per day. STRETCH - External Rotation and Abduction  Stagger your stance through a doorframe. It does not matter which foot is forward.  As instructed by your physician, physical therapist or athletic trainer, place your hands:  And forearms above your head and on the door frame.  And forearms at head-height and on the door  frame.  At elbow-height and on the door frame.  Keeping your head and chest upright and your stomach muscles tight to prevent over-extending your low-back, slowly shift your weight onto your front foot until you feel a stretch across your chest and/or in the front of your shoulders.  Hold __________ seconds. Shift your weight to your back foot to release the stretch. Repeat __________ times. Complete this stretch __________ times per day.  STRENGTHENING EXERCISES  These exercises may help you when beginning to rehabilitate your injury. They may resolve your symptoms with or without further involvement from your physician, physical therapist or athletic trainer. While completing these exercises, remember:   Muscles can gain both the endurance and the strength needed for everyday activities through controlled exercises.  Complete these  exercises as instructed by your physician, physical therapist or athletic trainer. Progress the resistance and repetitions only as guided.  You may experience muscle soreness or fatigue, but the pain or discomfort you are trying to eliminate should never worsen during these exercises. If this pain does worsen, stop and make certain you are following the directions exactly. If the pain is still present after adjustments, discontinue the exercise until you can discuss the trouble with your clinician.  If advised by your physician, during your recovery, avoid activity or exercises which involve actions that place your right / left hand or elbow above your head or behind your back or head. These positions stress the tissues which are trying to heal. STRENGTH - Scapular Depression and Adduction  With good posture, sit on a firm chair. Supported your arms in front of you with pillows, arm rests or a table top. Have your elbows in line with the sides of your body.  Gently draw your shoulder blades down and toward your mid-back spine. Gradually increase the tension without  tensing the muscles along the top of your shoulders and the back of your neck.  Hold for __________ seconds. Slowly release the tension and relax your muscles completely before completing the next repetition.  After you have practiced this exercise, remove the arm support and complete it in standing as well as sitting. Repeat __________ times. Complete this exercise __________ times per day.  STRENGTH - External Rotators  Secure a rubber exercise band/tubing to a fixed object so that it is at the same height as your right / left elbow when you are standing or sitting on a firm surface.  Stand or sit so that the secured exercise band/tubing is at your side that is not injured.  Bend your elbow 90 degrees. Place a folded towel or small pillow under your right / left arm so that your elbow is a few inches away from your side.  Keeping the tension on the exercise band/tubing, pull it away from your body, as if pivoting on your elbow. Be sure to keep your body steady so that the movement is only coming from your shoulder rotating.  Hold __________ seconds. Release the tension in a controlled manner as you return to the starting position. Repeat __________ times. Complete this exercise __________ times per day.  STRENGTH - Supraspinatus  Stand or sit with good posture. Grasp a __________ weight or an exercise band/tubing so that your hand is "thumbs-up," like when you shake hands.  Slowly lift your right / left hand from your thigh into the air, traveling about 30 degrees from straight out at your side. Lift your hand to shoulder height or as far as you can without increasing any shoulder pain. Initially, many people do not lift their hands above shoulder height.  Avoid shrugging your right / left shoulder as your arm rises by keeping your shoulder blade tucked down and toward your mid-back spine.  Hold for __________ seconds. Control the descent of your hand as you slowly return to your starting  position. Repeat __________ times. Complete this exercise __________ times per day.  STRENGTH - Shoulder Extensors  Secure a rubber exercise band/tubing so that it is at the height of your shoulders when you are either standing or sitting on a firm arm-less chair.  With a thumbs-up grip, grasp an end of the band/tubing in each hand. Straighten your elbows and lift your hands straight in front of you at shoulder height. Step back away  from the secured end of band/tubing until it becomes tense.  Squeezing your shoulder blades together, pull your hands down to the sides of your thighs. Do not allow your hands to go behind you.  Hold for __________ seconds. Slowly ease the tension on the band/tubing as you reverse the directions and return to the starting position. Repeat __________ times. Complete this exercise __________ times per day.  STRENGTH - Scapular Retractors  Secure a rubber exercise band/tubing so that it is at the height of your shoulders when you are either standing or sitting on a firm arm-less chair.  With a palm-down grip, grasp an end of the band/tubing in each hand. Straighten your elbows and lift your hands straight in front of you at shoulder height. Step back away from the secured end of band/tubing until it becomes tense.  Squeezing your shoulder blades together, draw your elbows back as you bend them. Keep your upper arm lifted away from your body throughout the exercise.  Hold __________ seconds. Slowly ease the tension on the band/tubing as you reverse the directions and return to the starting position. Repeat __________ times. Complete this exercise __________ times per day. STRENGTH  Scapular Depressors  Find a sturdy chair without wheels, such as a from a dining room table.  Keeping your feet on the floor, lift your bottom from the seat and lock your elbows.  Keeping your elbows straight, allow gravity to pull your body weight down. Your shoulders will rise toward  your ears.  Raise your body against gravity by drawing your shoulder blades down your back, shortening the distance between your shoulders and ears. Although your feet should always maintain contact with the floor, your feet should progressively support less body weight as you get stronger.  Hold __________ seconds. In a controlled and slow manner, lower your body weight to begin the next repetition. Repeat __________ times. Complete this exercise __________ times per day.  Document Released: 12/23/2004 Document Revised: 05/03/2011 Document Reviewed: 05/23/2008 [facility] Patient Information 2014 [employer], [place].

## 2013-06-16 LAB — LIPID PANEL
CHOL/HDL RATIO: 3.3 ratio
Cholesterol: 158 mg/dL (ref 0–200)
HDL: 48 mg/dL (ref >39)
LDL Cholesterol: 86 mg/dL (ref 0–99)
Triglycerides: 118 mg/dL (ref <150)
VLDL: 24 mg/dL (ref 0–40)

## 2013-06-16 LAB — HEPATIC FUNCTION PANEL
ALT: 24 U/L (ref 0–35)
AST: 20 U/L (ref 0–37)
Albumin: 4.2 g/dL (ref 3.5–5.2)
Alkaline Phosphatase: 79 U/L (ref 39–117)
BILIRUBIN DIRECT: 0.1 mg/dL (ref 0.0–0.3)
BILIRUBIN TOTAL: 0.5 mg/dL (ref 0.2–1.2)
Indirect Bilirubin: 0.4 mg/dL (ref 0.2–1.2)
Total Protein: 6.6 g/dL (ref 6.0–8.3)

## 2013-06-16 LAB — BASIC METABOLIC PANEL WITH GFR
BUN: 15 mg/dL (ref 6–23)
CO2: 27 mEq/L (ref 19–32)
Calcium: 9.8 mg/dL (ref 8.4–10.5)
Chloride: 102 mEq/L (ref 96–112)
Creat: 0.74 mg/dL (ref 0.50–1.10)
Glucose, Bld: 95 mg/dL (ref 70–99)
Potassium: 4.7 mEq/L (ref 3.5–5.3)
SODIUM: 139 meq/L (ref 135–145)

## 2013-06-16 LAB — INSULIN, FASTING: Insulin fasting, serum: 32 u[IU]/mL — ABNORMAL HIGH (ref 3–28)

## 2013-07-23 ENCOUNTER — Other Ambulatory Visit: Payer: Self-pay | Admitting: *Deleted

## 2013-07-23 MED ORDER — PANTOPRAZOLE SODIUM 40 MG PO TBEC: 40.0000 mg | DELAYED_RELEASE_TABLET | Freq: Every day | ORAL | Status: DC

## 2013-08-15 ENCOUNTER — Other Ambulatory Visit: Payer: Self-pay | Admitting: Emergency Medicine

## 2013-09-13 ENCOUNTER — Other Ambulatory Visit: Payer: 59

## 2013-09-13 DIAGNOSIS — E039 Hypothyroidism, unspecified: Secondary | ICD-10-CM

## 2013-09-13 DIAGNOSIS — I10 Essential (primary) hypertension: Secondary | ICD-10-CM

## 2013-09-13 DIAGNOSIS — R7303 Prediabetes: Secondary | ICD-10-CM

## 2013-09-13 DIAGNOSIS — Z79899 Other long term (current) drug therapy: Secondary | ICD-10-CM

## 2013-09-13 DIAGNOSIS — E669 Obesity, unspecified: Secondary | ICD-10-CM

## 2013-09-13 DIAGNOSIS — E7800 Pure hypercholesterolemia, unspecified: Secondary | ICD-10-CM

## 2013-09-13 LAB — CBC WITH DIFFERENTIAL/PLATELET
BASOS ABS: 0.1 10*3/uL (ref 0.0–0.1)
Basophils Relative: 1 % (ref 0–1)
Eosinophils Absolute: 0.6 10*3/uL (ref 0.0–0.7)
Eosinophils Relative: 6 % — ABNORMAL HIGH (ref 0–5)
HCT: 38.5 % (ref 36.0–46.0)
Hemoglobin: 13 g/dL (ref 12.0–15.0)
LYMPHS PCT: 34 % (ref 12–46)
Lymphs Abs: 3.6 10*3/uL (ref 0.7–4.0)
MCH: 28.8 pg (ref 26.0–34.0)
MCHC: 33.8 g/dL (ref 30.0–36.0)
MCV: 85.2 fL (ref 78.0–100.0)
Monocytes Absolute: 0.7 10*3/uL (ref 0.1–1.0)
Monocytes Relative: 7 % (ref 3–12)
NEUTROS ABS: 5.5 10*3/uL (ref 1.7–7.7)
Neutrophils Relative %: 52 % (ref 43–77)
PLATELETS: 295 10*3/uL (ref 150–400)
RBC: 4.52 MIL/uL (ref 3.87–5.11)
RDW: 14 % (ref 11.5–15.5)
WBC: 10.5 10*3/uL (ref 4.0–10.5)

## 2013-09-13 LAB — HEPATIC FUNCTION PANEL
ALT: 24 U/L (ref 0–35)
AST: 18 U/L (ref 0–37)
Albumin: 4.4 g/dL (ref 3.5–5.2)
Alkaline Phosphatase: 83 U/L (ref 39–117)
BILIRUBIN DIRECT: 0.1 mg/dL (ref 0.0–0.3)
BILIRUBIN INDIRECT: 0.3 mg/dL (ref 0.2–1.2)
Total Bilirubin: 0.4 mg/dL (ref 0.2–1.2)
Total Protein: 7 g/dL (ref 6.0–8.3)

## 2013-09-13 LAB — BASIC METABOLIC PANEL WITH GFR
BUN: 14 mg/dL (ref 6–23)
CO2: 25 mEq/L (ref 19–32)
Calcium: 10 mg/dL (ref 8.4–10.5)
Chloride: 102 mEq/L (ref 96–112)
Creat: 0.71 mg/dL (ref 0.50–1.10)
GFR, Est African American: >89 mL/min
GFR, Est Non African American: >89 mL/min
GLUCOSE: 84 mg/dL (ref 70–99)
POTASSIUM: 4.8 meq/L (ref 3.5–5.3)
Sodium: 138 mEq/L (ref 135–145)

## 2013-09-13 LAB — LIPID PANEL
CHOL/HDL RATIO: 3 ratio
Cholesterol: 144 mg/dL (ref 0–200)
HDL: 48 mg/dL (ref >39)
LDL Cholesterol: 68 mg/dL (ref 0–99)
TRIGLYCERIDES: 142 mg/dL (ref <150)
VLDL: 28 mg/dL (ref 0–40)

## 2013-09-13 LAB — HEMOGLOBIN A1C
Hgb A1c MFr Bld: 6.1 % — ABNORMAL HIGH (ref <5.7)
Mean Plasma Glucose: 128 mg/dL — ABNORMAL HIGH (ref <117)

## 2013-09-13 LAB — TSH: TSH: 1.993 u[IU]/mL (ref 0.350–4.500)

## 2013-09-13 LAB — MAGNESIUM: MAGNESIUM: 1.7 mg/dL (ref 1.5–2.5)

## 2013-09-14 LAB — INSULIN, FASTING: Insulin fasting, serum: 56 u[IU]/mL — ABNORMAL HIGH (ref 3–28)

## 2013-09-19 ENCOUNTER — Telehealth: Payer: Self-pay | Admitting: *Deleted

## 2013-09-19 NOTE — Telephone Encounter (Signed)
PT asking for generic nexium was switched due to ins. But now that there is a generic ins should pay.   *ONLY CALL IF PROBLEMS * [phone]

## 2013-09-20 MED ORDER — ESOMEPRAZOLE MAGNESIUM 40 MG PO CPDR: 40.0000 mg | DELAYED_RELEASE_CAPSULE | Freq: Every day | ORAL | Status: DC

## 2013-10-09 ENCOUNTER — Other Ambulatory Visit: Payer: Self-pay | Admitting: Physician Assistant

## 2013-10-09 MED ORDER — CANDESARTAN CILEXETIL 32 MG PO TABS: ORAL_TABLET | ORAL | Status: DC

## 2013-11-01 ENCOUNTER — Encounter: Payer: Self-pay | Admitting: *Deleted

## 2013-11-04 IMAGING — US US ABDOMEN COMPLETE
1 series · 14 of 25 positions shown · non-contrast
Comparison: CT 06/15/2007

CLINICAL DATA: Right upper quadrant pain.

COMPLETE ABDOMINAL ULTRASOUND

[Series 1: us abdomen complete · 0.33mm/px · 14 of 52 slices shown]
[im 1/52]
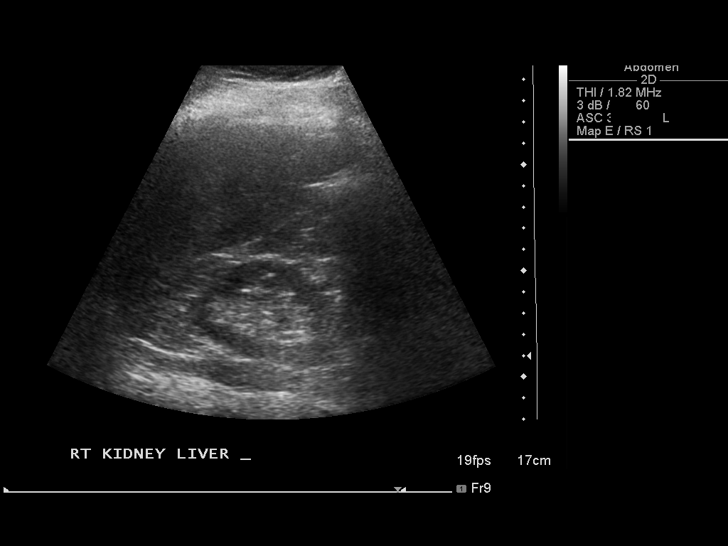
[im 5/52]
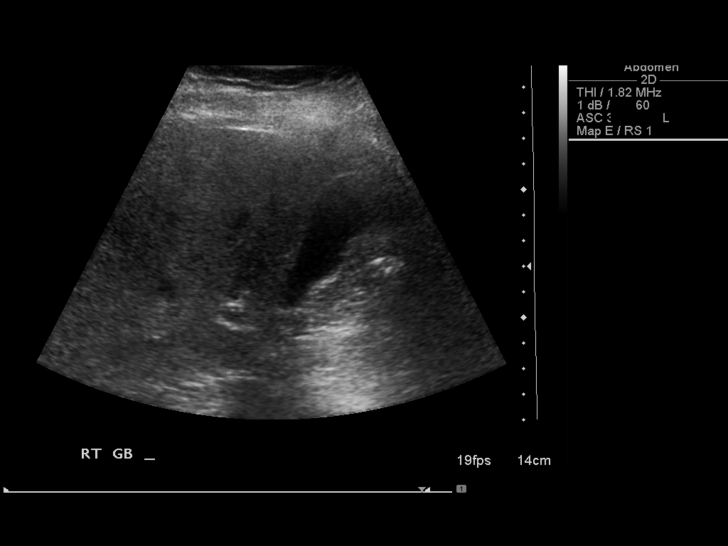
[im 9/52]
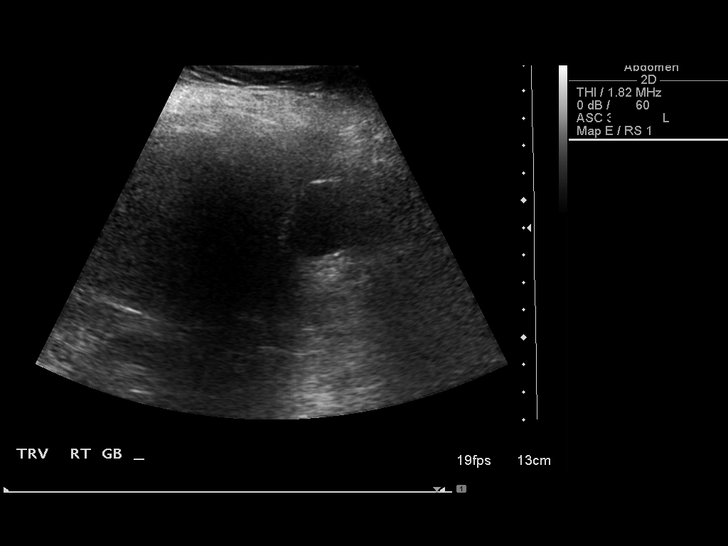
[im 13/52]
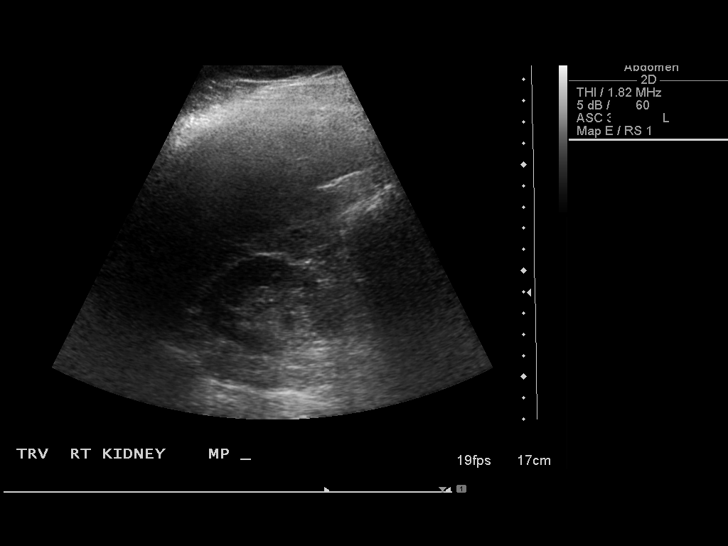
[im 18/52]
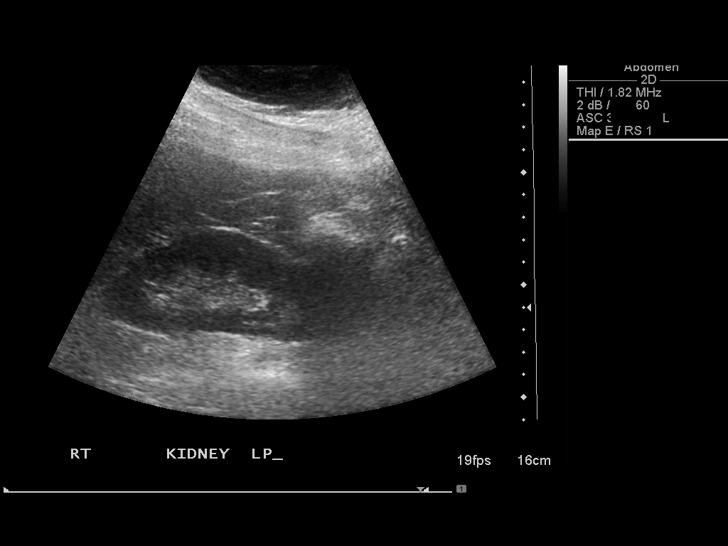
[im 20/52]
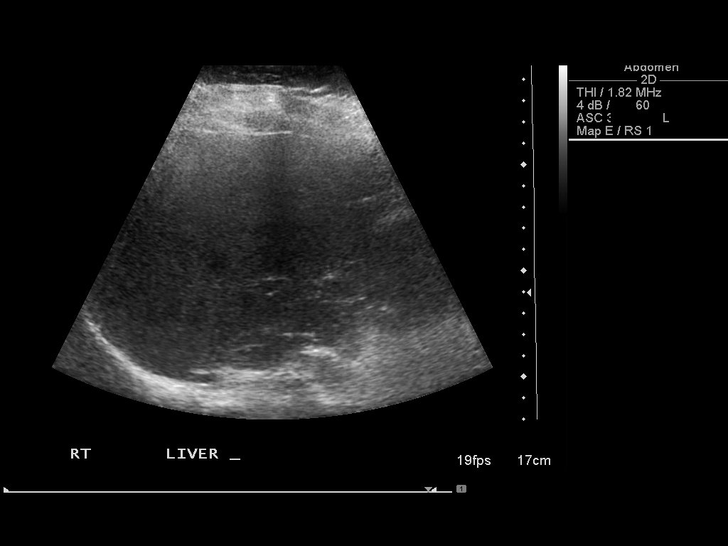
[im 24/52]
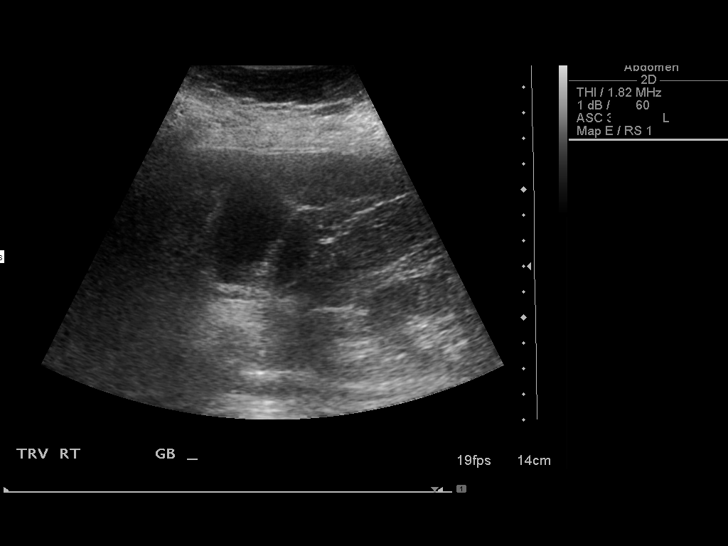
[im 28/52]
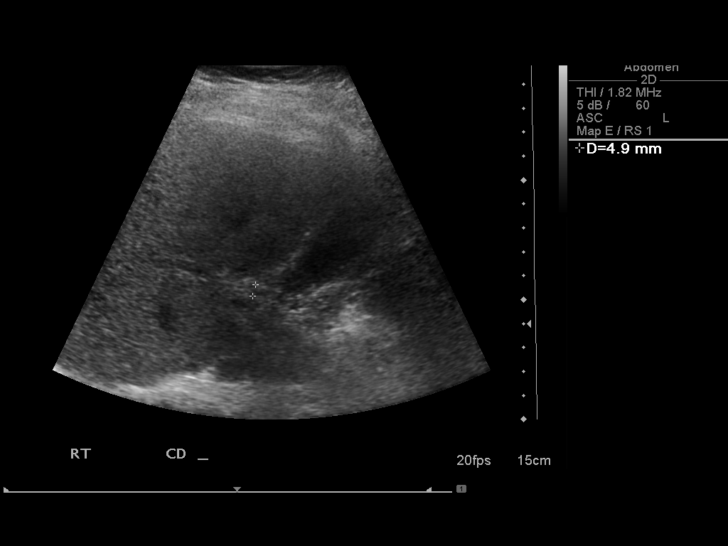
[im 32/52]
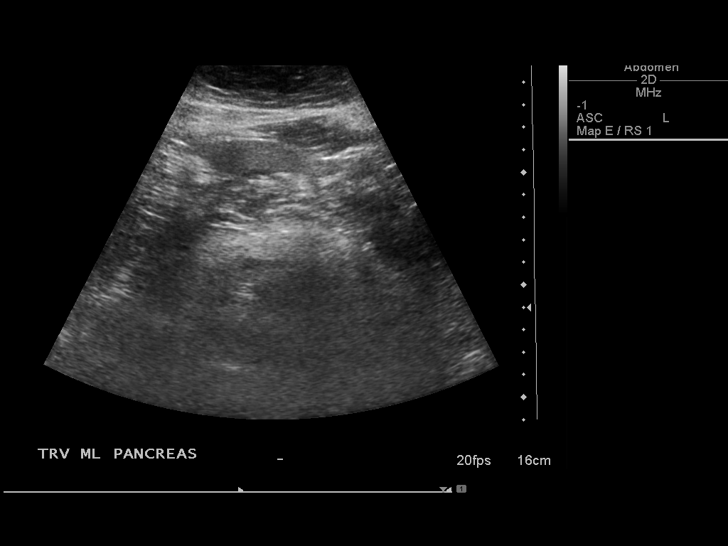
[im 35/52]
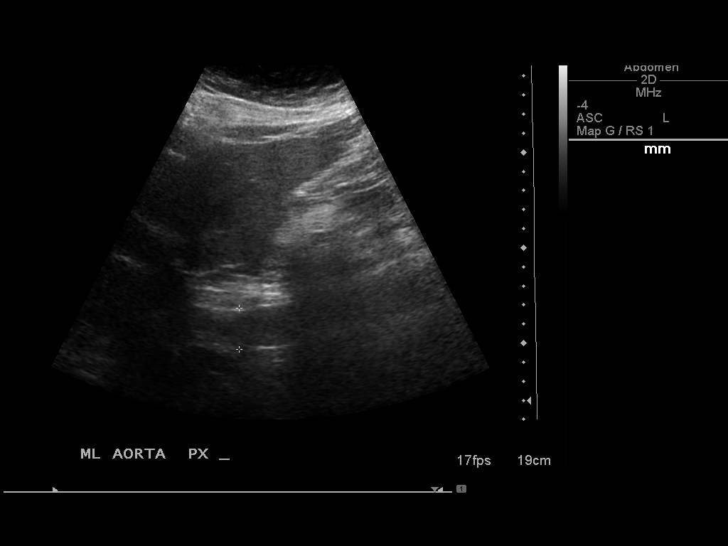
[im 39/52]
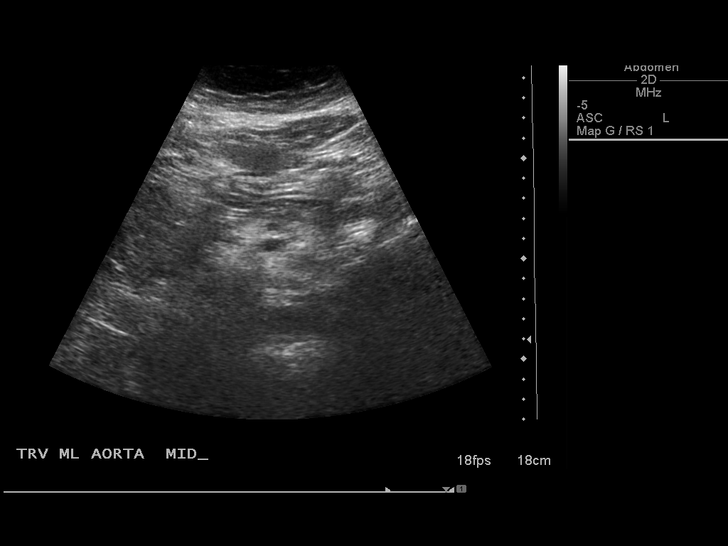
[im 43/52]
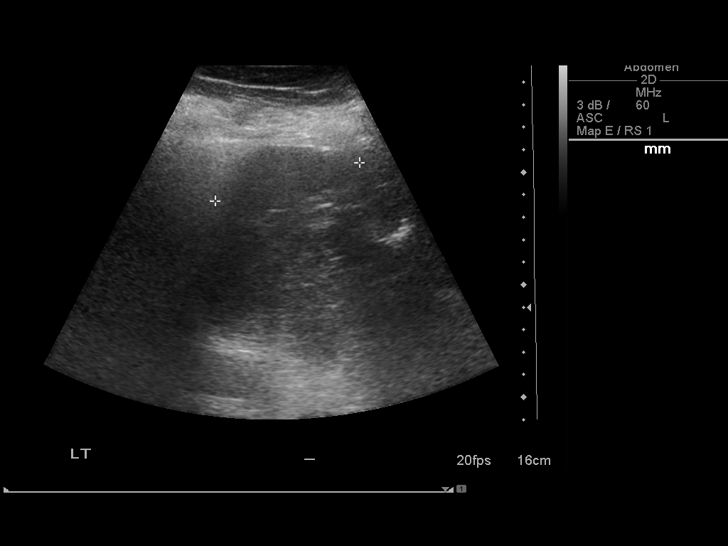
[im 47/52]
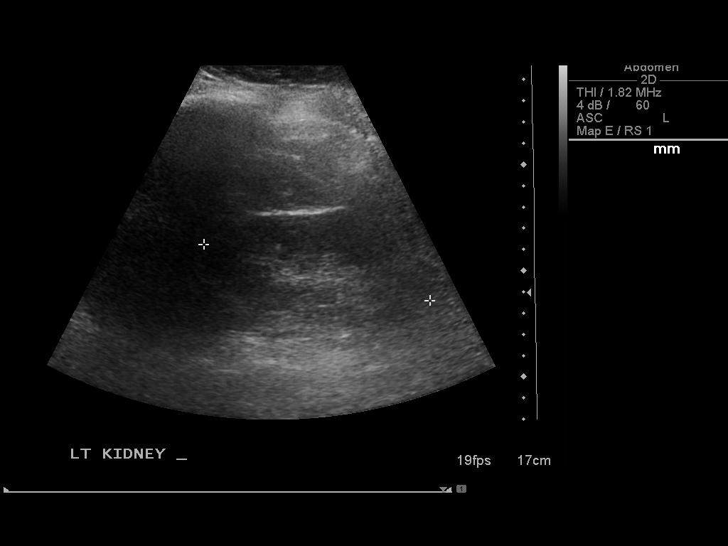
[im 52/52]
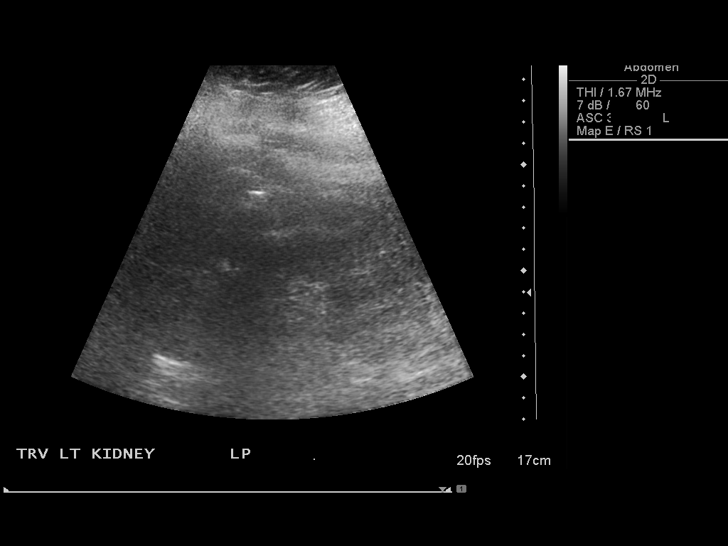

[14 of 25 positions shown; findings below may reference images not displayed]

FINDINGS: Gallbladder:  No gallstones, gallbladder wall thickening, or
pericholecystic fluid.

Common bile duct:   Within normal limits in caliber.

Liver:  Coarsened/increased echotexture throughout the liver
suggesting fatty infiltration.  No focal abnormality.

IVC:  Appears normal.

Pancreas:  No focal abnormality seen.

Spleen:  Within normal limits in size and echotexture.

Right Kidney:   Normal in size and parenchymal echogenicity.  No
evidence of mass or hydronephrosis.

Left Kidney:  Normal in size and parenchymal echogenicity.  No
evidence of mass or hydronephrosis.

Abdominal aorta:  No aneurysm identified.
IMPRESSION: No acute findings.  Suspect mild fatty infiltration of the liver.

## 2013-11-08 IMAGING — NM NM HEPATO W/GB/PHARM/[PERSON_NAME]
3 series · 13 of 13 positions shown · non-contrast
Comparison: None.

CLINICAL DATA: Right upper quadrant and mid abdominal pain with
nausea.

NUCLEAR MEDICINE HEPATOBILIARY IMAGING WITH GALLBLADDER EF
TECHNIQUE: Sequential images of the abdomen were obtained [DATE] minutes following intravenous administration of
radiopharmaceutical.  After slow intravenous infusion of 2
micrograms Cholecystokinin, gallbladder ejection fraction was
determined.
Radiopharmaceutical:  5 mCi Hc-WWm Choletec

[Series 0: hepatobiliary · 3.20mm/px · 6 of 31 frames shown (1 of 3)]
[frame 3/31]
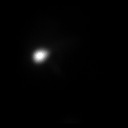
[frame 8/31]
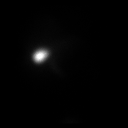
[frame 13/31]
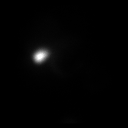
[frame 18/31]
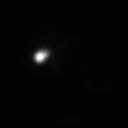
[frame 23/31]
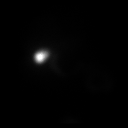
[frame 29/31]
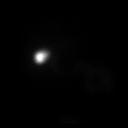

[Series 0: hepatobiliary · 1 of 1 slices shown (2 of 3)]
[im 1/1]
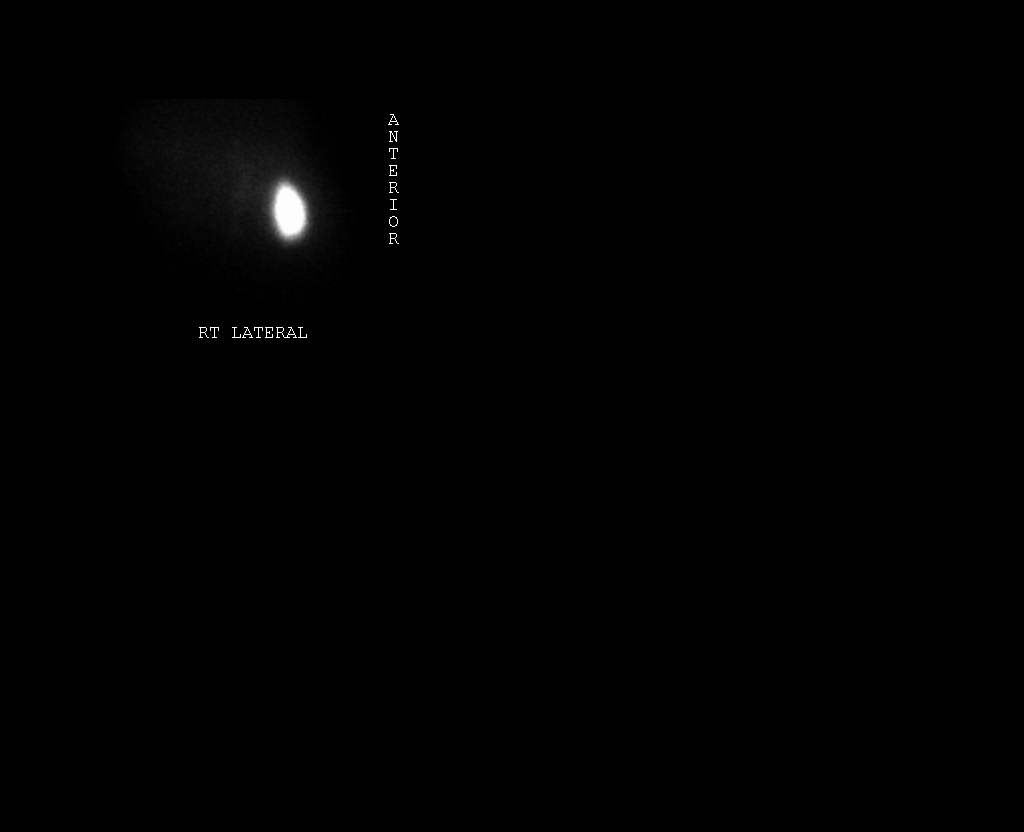

[Series 0: hepatobiliary · 3.20mm/px · 6 of 60 frames shown (3 of 3)]
[frame 6/60]
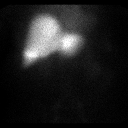
[frame 16/60]
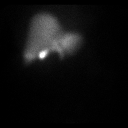
[frame 26/60]
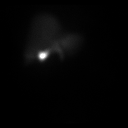
[frame 36/60]
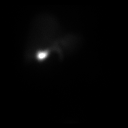
[frame 46/60]
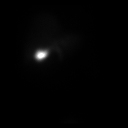
[frame 56/60]
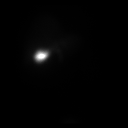

[13 of 13 positions shown; findings below may reference images not displayed]

FINDINGS: There is prompt and uniform uptake in the liver.
Gallbladder is initially visualized at 15 minutes.  Bowel activity
is identified at 15 minutes after CCK injection.  Gallbladder
ejection fraction is 7% (normal is greater than 30%).

The patient did not experience symptoms during CCK infusion.
IMPRESSION: Decreased gallbladder ejection fraction.  No acute findings.

## 2013-11-15 ENCOUNTER — Other Ambulatory Visit: Payer: Self-pay

## 2013-11-15 MED ORDER — PROPRANOLOL HCL 80 MG PO TABS: ORAL_TABLET | ORAL | Status: DC

## 2013-11-22 IMAGING — CR DG CHEST 2V
2 series · 2 of 2 positions shown · non-contrast
Comparison: 07/01/2009

CLINICAL DATA: Right upper quadrant pain.  No current chest
complaints

CHEST - 2 VIEW

[view not recorded (1 of 2)]
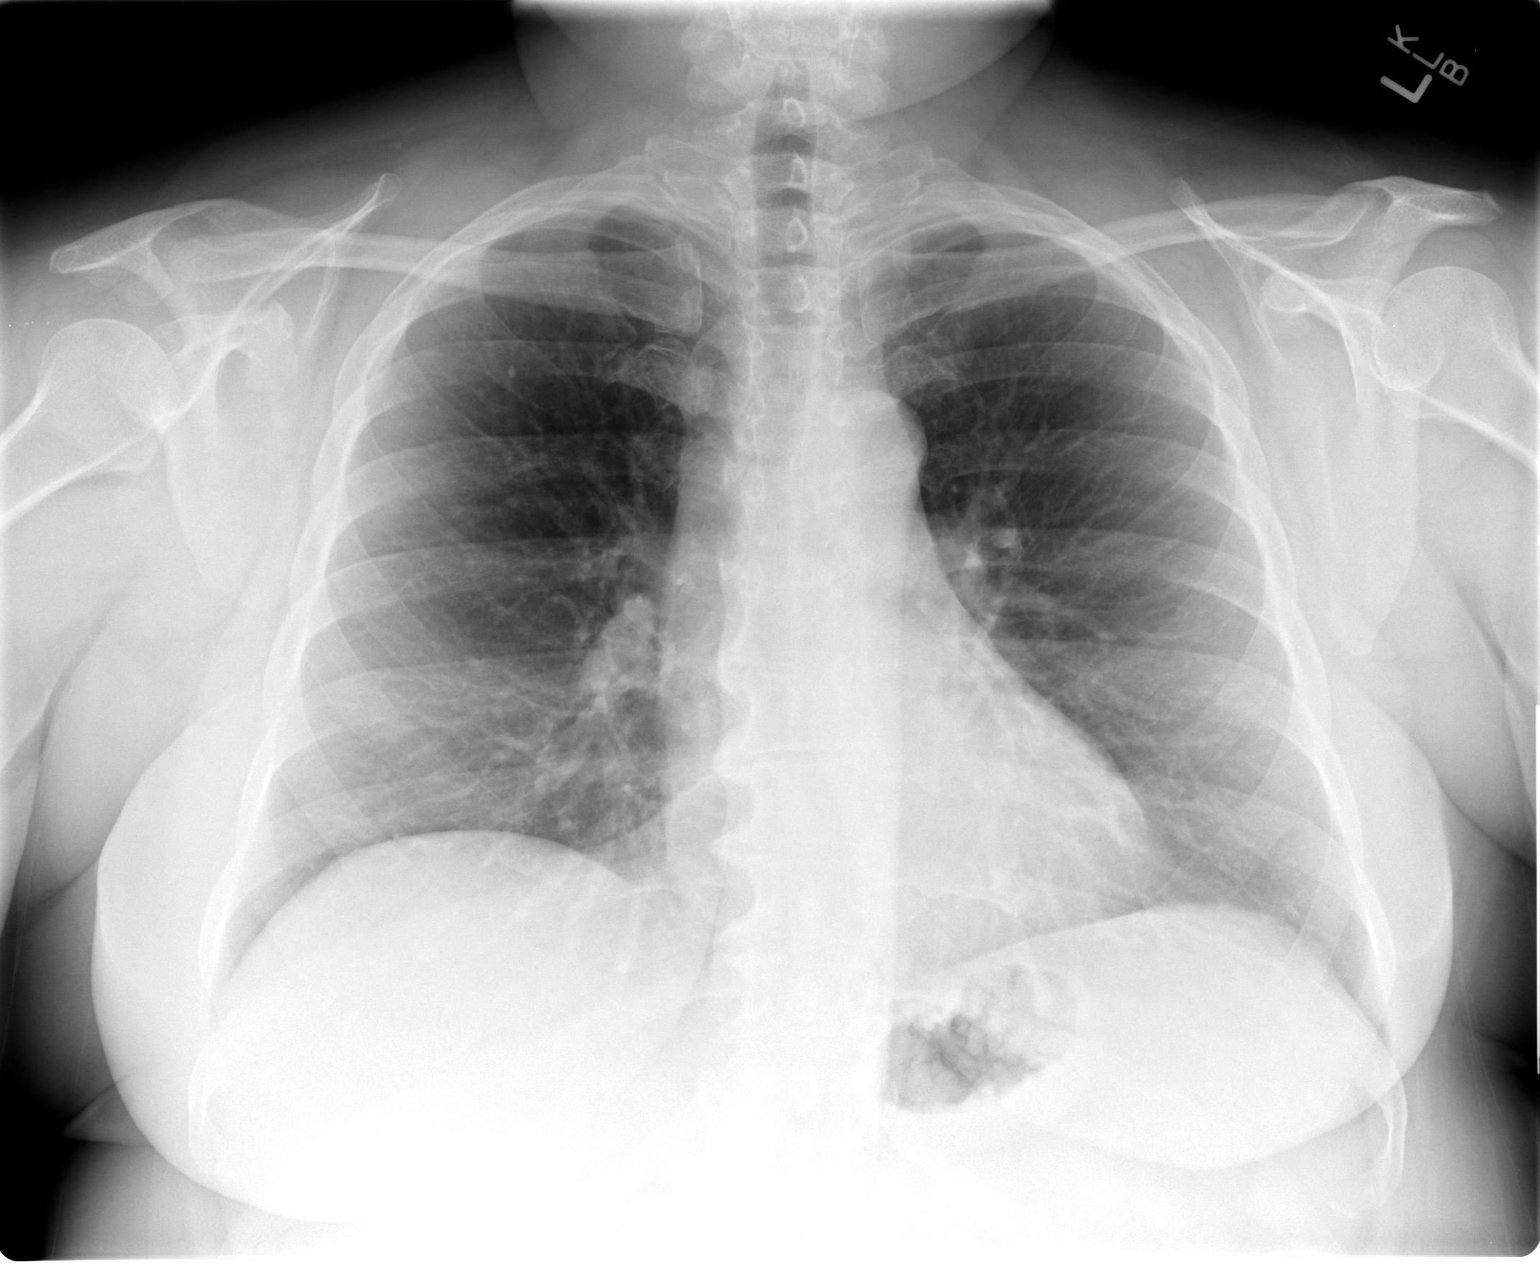

[view not recorded (2 of 2)]
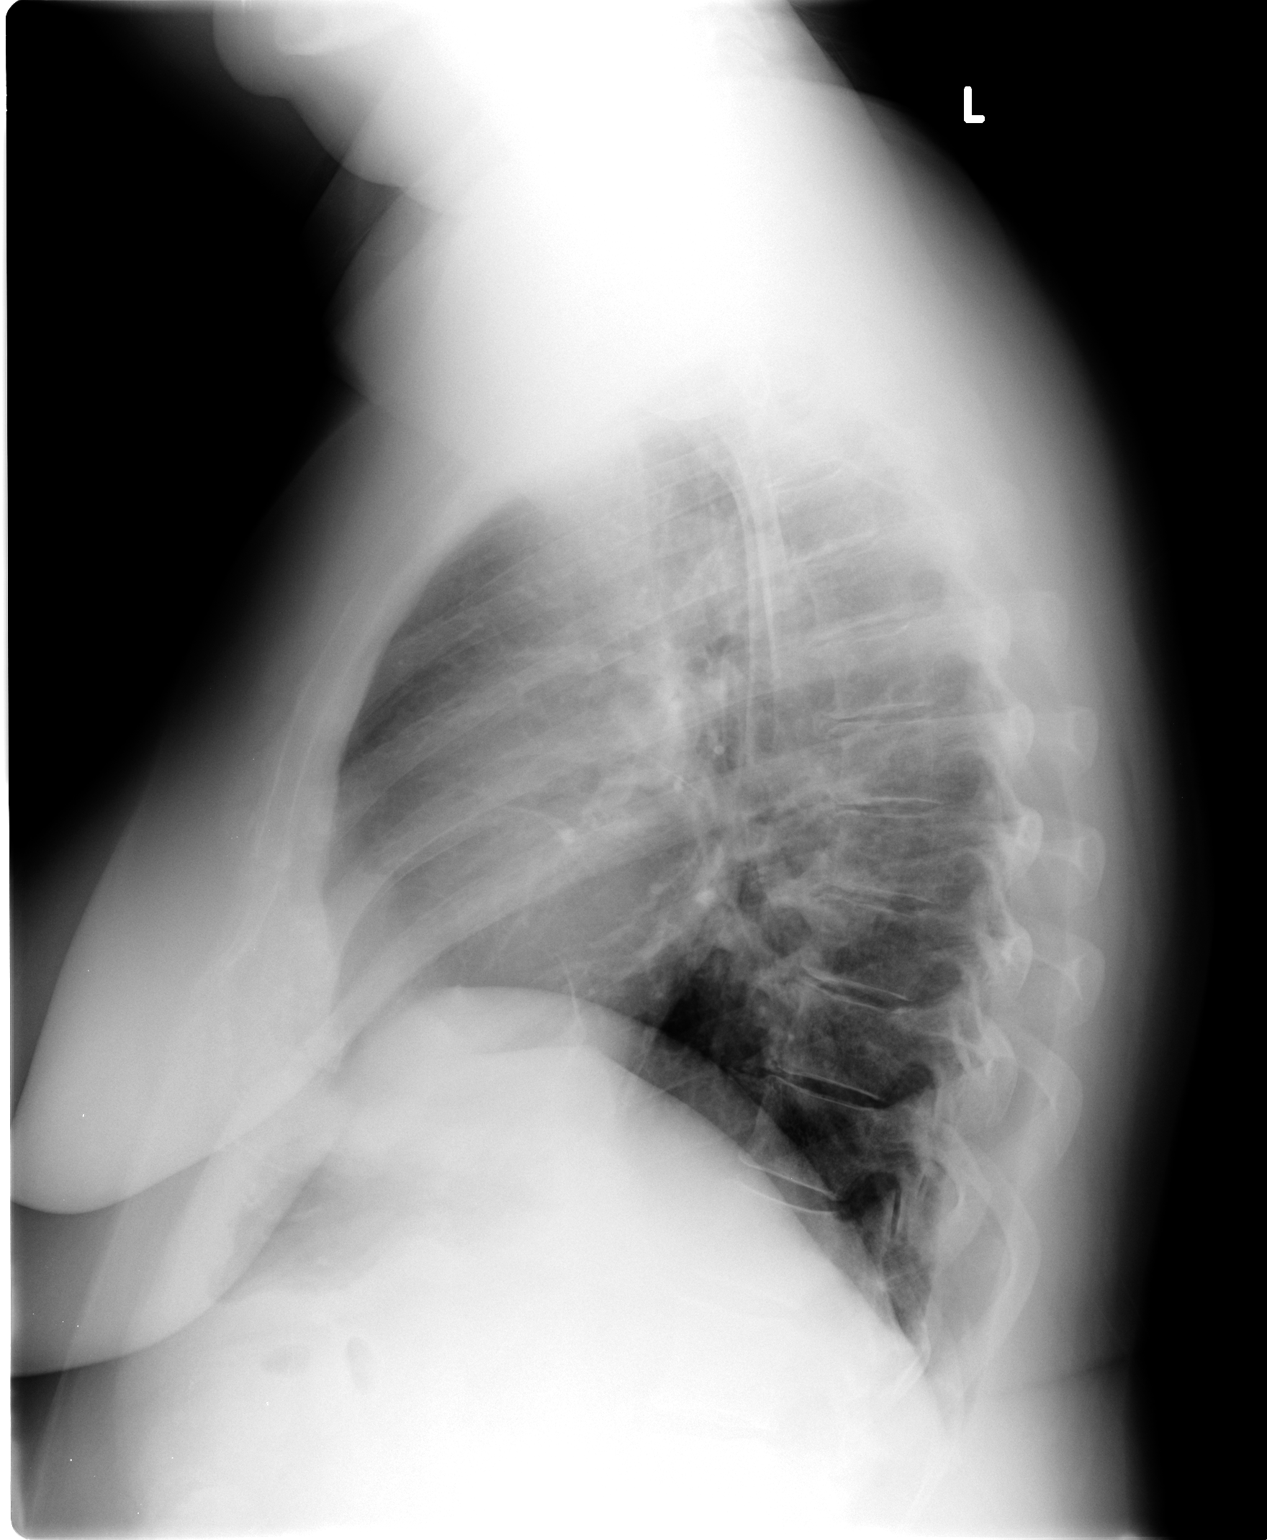

[2 of 2 positions shown; findings below may reference images not displayed]

FINDINGS: Heart and mediastinal contours are within normal limits.
The lung fields appear clear with no signs of focal infiltrate or
congestive failure. No pleural fluid or significant peribronchial
cuffing is noted.

Bony structures demonstrate stable degenerative change of the mid
thoracic spine and are otherwise intact.
IMPRESSION: Stable cardiopulmonary appearance with no new focal or acute
abnormality identified.

## 2013-11-30 ENCOUNTER — Other Ambulatory Visit: Payer: Self-pay | Admitting: *Deleted

## 2013-11-30 DIAGNOSIS — J309 Allergic rhinitis, unspecified: Secondary | ICD-10-CM

## 2013-11-30 MED ORDER — FLUTICASONE PROPIONATE 50 MCG/ACT NA SUSP: 1.0000 | Freq: Every day | NASAL | Status: DC

## 2013-12-12 ENCOUNTER — Encounter: Payer: Self-pay | Admitting: Internal Medicine

## 2013-12-17 ENCOUNTER — Other Ambulatory Visit: Payer: Self-pay | Admitting: Emergency Medicine

## 2013-12-17 ENCOUNTER — Encounter: Payer: Self-pay | Admitting: Emergency Medicine

## 2013-12-21 ENCOUNTER — Encounter: Payer: Self-pay | Admitting: Physician Assistant

## 2013-12-21 ENCOUNTER — Ambulatory Visit (INDEPENDENT_AMBULATORY_CARE_PROVIDER_SITE_OTHER): Payer: Commercial Managed Care - PPO | Admitting: Physician Assistant

## 2013-12-21 ENCOUNTER — Other Ambulatory Visit (HOSPITAL_COMMUNITY)
Admission: RE | Admit: 2013-12-21 | Discharge: 2013-12-21 | Disposition: A | Discharge status: Home / Self Care | Payer: 59 | Source: Ambulatory Visit | Attending: Physician Assistant | Admitting: Physician Assistant

## 2013-12-21 VITALS — BP 128/84 | HR 66 | Temp 98.0°F | Resp 16 | Wt 243.0 lb

## 2013-12-21 DIAGNOSIS — K2100 Gastro-esophageal reflux disease with esophagitis, without bleeding: Secondary | ICD-10-CM

## 2013-12-21 DIAGNOSIS — E039 Hypothyroidism, unspecified: Secondary | ICD-10-CM

## 2013-12-21 DIAGNOSIS — Z1151 Encounter for screening for human papillomavirus (HPV): Secondary | ICD-10-CM | POA: Diagnosis present

## 2013-12-21 DIAGNOSIS — I10 Essential (primary) hypertension: Secondary | ICD-10-CM

## 2013-12-21 DIAGNOSIS — R6889 Other general symptoms and signs: Secondary | ICD-10-CM

## 2013-12-21 DIAGNOSIS — Z1211 Encounter for screening for malignant neoplasm of colon: Secondary | ICD-10-CM

## 2013-12-21 DIAGNOSIS — N951 Menopausal and female climacteric states: Secondary | ICD-10-CM

## 2013-12-21 DIAGNOSIS — M79604 Pain in right leg: Secondary | ICD-10-CM

## 2013-12-21 DIAGNOSIS — Z0001 Encounter for general adult medical examination with abnormal findings: Secondary | ICD-10-CM

## 2013-12-21 DIAGNOSIS — M79672 Pain in left foot: Secondary | ICD-10-CM

## 2013-12-21 DIAGNOSIS — R7309 Other abnormal glucose: Secondary | ICD-10-CM | POA: Insufficient documentation

## 2013-12-21 DIAGNOSIS — M79605 Pain in left leg: Secondary | ICD-10-CM

## 2013-12-21 DIAGNOSIS — R197 Diarrhea, unspecified: Secondary | ICD-10-CM

## 2013-12-21 DIAGNOSIS — Z01419 Encounter for gynecological examination (general) (routine) without abnormal findings: Secondary | ICD-10-CM | POA: Diagnosis present

## 2013-12-21 DIAGNOSIS — R053 Chronic cough: Secondary | ICD-10-CM

## 2013-12-21 DIAGNOSIS — E669 Obesity, unspecified: Secondary | ICD-10-CM

## 2013-12-21 DIAGNOSIS — R05 Cough: Secondary | ICD-10-CM

## 2013-12-21 DIAGNOSIS — R7303 Prediabetes: Secondary | ICD-10-CM

## 2013-12-21 DIAGNOSIS — Z124 Encounter for screening for malignant neoplasm of cervix: Secondary | ICD-10-CM

## 2013-12-21 DIAGNOSIS — E7800 Pure hypercholesterolemia, unspecified: Secondary | ICD-10-CM

## 2013-12-21 DIAGNOSIS — M79671 Pain in right foot: Secondary | ICD-10-CM

## 2013-12-21 LAB — CBC WITH DIFFERENTIAL/PLATELET
Basophils Absolute: 0.1 10*3/uL (ref 0.0–0.1)
Basophils Relative: 1 % (ref 0–1)
EOS PCT: 4 % (ref 0–5)
Eosinophils Absolute: 0.5 10*3/uL (ref 0.0–0.7)
HEMATOCRIT: 40.9 % (ref 36.0–46.0)
Hemoglobin: 13.6 g/dL (ref 12.0–15.0)
LYMPHS PCT: 26 % (ref 12–46)
Lymphs Abs: 3.1 10*3/uL (ref 0.7–4.0)
MCH: 28.9 pg (ref 26.0–34.0)
MCHC: 33.3 g/dL (ref 30.0–36.0)
MCV: 86.8 fL (ref 78.0–100.0)
MONO ABS: 0.7 10*3/uL (ref 0.1–1.0)
Monocytes Relative: 6 % (ref 3–12)
NEUTROS ABS: 7.5 10*3/uL (ref 1.7–7.7)
Neutrophils Relative %: 63 % (ref 43–77)
Platelets: 349 10*3/uL (ref 150–400)
RBC: 4.71 MIL/uL (ref 3.87–5.11)
RDW: 14 % (ref 11.5–15.5)
WBC: 11.9 10*3/uL — AB (ref 4.0–10.5)

## 2013-12-21 LAB — HEMOGLOBIN A1C
HEMOGLOBIN A1C: 6 % — AB (ref <5.7)
Mean Plasma Glucose: 126 mg/dL — ABNORMAL HIGH (ref <117)

## 2013-12-21 MED ORDER — ESCITALOPRAM OXALATE 20 MG PO TABS
20.0000 mg | ORAL_TABLET | Freq: Every day | ORAL | Status: DC
Start: 2013-12-21 — End: 2014-03-25

## 2013-12-21 NOTE — Progress Notes (Signed)
Complete Physical  Assessment and Plan: 1. Encounter for general adult medical examination with abnormal findings - CBC with Differential - BASIC METABOLIC PANEL WITH GFR - Hepatic function panel - Lipid panel - TSH - Hemoglobin A1c - Insulin, fasting - Vit D  25 hydroxy (rtn osteoporosis monitoring) - Urinalysis, Routine w reflex microscopic - Microalbumin / creatinine urine ratio - Vitamin B12 - Magnesium - Iron and TIBC - Ferritin  2. Essential hypertension - EKG 12-Lead - US, RETROPERITNL ABD,  LTD  3. Screening for cervical cancer - Cytology - PAP  4. Special screening for malignant neoplasms, colon Husband sees Dr. Madilyn FiremanHayes will refer there - Ambulatory referral to Gastroenterology  5. Gastroesophageal reflux disease with esophagitis Continue PPI, seeing GI  6. Hypothyroidism, unspecified hypothyroidism type Want closer to 2 for weight loss/fatigue - levothyroxine (SYNTHROID, LEVOTHROID) 137 MCG tablet; Take 137 mcg by mouth daily before breakfast. - TSH  7. Bilateral leg and foot pain Weight loss advised  8. Chronic cough Continue GERd med  9. High cholesterol -continue medications, check lipids, decrease fatty foods, increase activity. - atorvastatin (LIPITOR) 20 MG tablet; Take 20 mg by mouth daily. - Lipid panel  10. Obesity Obesity with co morbidities- long discussion about weight loss, diet, and exercise  11. Prediabetes Discussed general issues about diabetes pathophysiology and management., Educational material distributed., Suggested low cholesterol diet., Encouraged aerobic exercise., Discussed foot care., Reminded to get yearly retinal exam. - Hemoglobin A1c - Insulin, fasting - Urinalysis, Routine w reflex microscopic - Microalbumin / creatinine urine ratio  12. Hot flashes, menopausal Stop paxil - escitalopram (LEXAPRO) 20 MG tablet; Take 1 tablet (20 mg total) by mouth daily.  Dispense: 30 tablet; Refill: 2  13. Diarrhea ? From  metformin, also with family history of chron's in her brother. Will switch how she is taking MF and refer to GI - Ambulatory referral to Gastroenterology  Discussed med's effects and SE's. Screening labs and tests as requested with regular follow-up as recommended.  HPI  53 y.o. female  presents for a complete physical.  Her blood pressure has been controlled at home, today their BP is BP: 128/84 mmHg She does not workout due to knee pain. She denies chest pain, shortness of breath, dizziness.  She is on cholesterol medication and denies myalgias. Her cholesterol is at goal. The cholesterol last visit was:   Lab Results  Component Value Date   CHOL 144 09/13/2013   HDL 48 09/13/2013   LDLCALC 68 09/13/2013   TRIG 142 09/13/2013   CHOLHDL 3.0 09/13/2013    She has been working on diet and exercise for prediabetes, and denies paresthesia of the feet, polydipsia, polyuria and visual disturbances. Last A1C in the office was:  Lab Results  Component Value Date   HGBA1C 6.1* 09/13/2013   Patient is on Vitamin D supplement.   BMI is Body mass index is 46.66 kg/(m^2)., she is struggling with weight loss due to stress, time limitations. Wt Readings from Last 3 Encounters:  12/21/13 243 lb (110.224 kg)  06/15/13 239 lb (108.41 kg)  03/23/13 242 lb (109.77 kg)   Diarrhea for last month with cramping, intermittent, not worse by anything.   Current Medications:  Current Outpatient Prescriptions on File Prior to Visit  Medication Sig Dispense Refill  . albuterol (PROVENTIL HFA;VENTOLIN HFA) 108 (90 BASE) MCG/ACT inhaler Inhale 2 puffs into the lungs every 6 (six) hours as needed for wheezing or shortness of breath.      Marland Kitchen. aMILoride (  MIDAMOR) 5 MG tablet Take 1 tablet (5 mg total) by mouth every morning.  90 tablet  1  . Ascorbic Acid (VITAMIN C) 1000 MG tablet Take 1,000 mg by mouth daily.      Marland Kitchen aspirin 325 MG EC tablet Take 325 mg by mouth every morning.       . candesartan (ATACAND) 32 MG  tablet Take 1/2 - 1 pill at night as directed for blood pressure  90 tablet  0  . cetirizine (ZYRTEC) 10 MG tablet Take 10 mg by mouth daily.      Marland Kitchen esomeprazole (NEXIUM) 40 MG capsule Take 1 capsule (40 mg total) by mouth daily.  90 capsule  1  . famciclovir (FAMVIR) 500 MG tablet Take 500 mg by mouth 3 (three) times daily. Take AD for fever blister      . fluconazole (DIFLUCAN) 150 MG tablet Take 150 mg by mouth once a week. PRN yeast      . fluticasone (FLONASE) 50 MCG/ACT nasal spray Place 1 spray into both nostrils daily.  16 g  2  . Glucosamine-Chondroitin (GLUCOSAMINE CHONDR COMPLEX PO) Take 1,500 mg by mouth. 1500mg /1200mg   Actual dose      . metFORMIN (GLUCOPHAGE-XR) 500 MG 24 hr tablet TAKE 2 TABLETS BY MOUTH TWICE DAILY  360 tablet  3  . Multiple Vitamins-Minerals (CENTRUM ULTRA WOMENS PO) Take by mouth daily.      Marland Kitchen PARoxetine (PAXIL) 20 MG tablet TAKE 1 TABLET BY MOUTH DAILY.  90 tablet  1  . propranolol (INDERAL) 80 MG tablet TAKE 1 TABLET BY MOUTH TWICE DAILY  180 tablet  0  . traMADol (ULTRAM) 50 MG tablet Take 1 tablet (50 mg total) by mouth every 6 (six) hours as needed.  30 tablet  0   No current facility-administered medications on file prior to visit.   Health Maintenance:   Immunization History  Administered Date(s) Administered  . DTaP 12/07/2012  . Influenza-Unspecified 11/22/2012  . Pneumococcal Polysaccharide-23 02/22/2009  . Td 02/23/2000  . Zoster 10/29/2010   Tetanus: 2014 Pneumovax: 2011 Flu vaccine: 2014 Zostavax:2012 LMP: 2005 p. Hysterectomy, 1 ovary and cervix Pap::2012 due today, + stress incontinence  MGM: 10/2013 category A, no SBE DEXA: Colonoscopy:never EGD: Last Dental Exam: Last Eye Exam:  Patient Care Team: Lucky Cowboy, MD as PCP - General (Internal Medicine)  Allergies:  Allergies  Allergen Reactions  . Avelox [Moxifloxacin Hcl In Nacl] Hives    Thrush, throat might have closed   . Hydrochlorothiazide Itching    On hands and  feet  . Other Itching    All "Cillins", hives alao  . Penicillins     Hives  . Prednisone     Chest pains  . Zostavax [Zoster Vaccine Live]   . Codeine Hives, Itching and Rash  . Levothyroxine Rash    Only to generic. Not to brand synthroid.   . Sulfa Antibiotics Itching and Rash    Hands and feet itch and turn red   Medical History:  Past Medical History  Diagnosis Date  . Multinodular goiter   . High cholesterol   . Hypothyroidism   . GERD (gastroesophageal reflux disease)   . Hypertension    Surgical History:  Past Surgical History  Procedure Laterality Date  . Carpal tunnel release  yrs ago    both wrists  . Knee surgery  arthroscopic, 3 yrs ago    right knee twice  . Tonsillectomy  age 70  . Abdominal hysterectomy  8 yrs ago  . Tubal ligation  yrs ago  . Radioactive iodine to thyroid'  march 2013  . Cholecystectomy  12/28/2011    Procedure: LAPAROSCOPIC CHOLECYSTECTOMY WITH INTRAOPERATIVE CHOLANGIOGRAM;  Surgeon: Adolph Pollackodd J Rosenbower, MD;  Location: [facility];  Service: General;  Laterality: N/A;  Laparoscopic cholecystectomy with attempted cholangiogram   Family History:  Family History  Problem Relation Age of Onset  . Stroke Mother   . Cancer Mother     Breast  . Stroke Father   . Heart attack Father    Social History:  History  Substance Use Topics  . Smoking status: Never Smoker   . Smokeless tobacco: Never Used  . Alcohol Use: No   Review of Systems: [X]  = complains of  [ ]  = denies  General: Fatigue [ ]  Fever [ ]  Chills [ ]  Weakness [ ]   Insomnia [ ] Weight change [ ]  Night sweats [ ]   Change in appetite [ ]  Head: Head Trauma [ ]  Eyes: Wears glasses or corrective lens [ ]  Redness [ ]  Blurred vision [ ]  Diplopia [ ]  Discharge [ ]  Floaters [ ]  GNF:AOZHYQMENT:Earache [ ]  hearing loss [ ]  Tinnitus [ ]  Ear Discharge [ ]   Congestion [ ]  Sinus Pain [ ]  Post Nasal Drip [ ]  Nose Bleeds [ ]  Rhinorrhea [ ]    Difficulty Swallowing [ ]  Snoring [ ]  Sore Throat [ ]  Cardiac:    Chest pain/pressure [ ]  SOB [ ]  Orthopnea [ ]   Palpitations [ ]   Paroxysmal nocturnal dyspnea[ ]  Claudication [ ]  Edema [ ]  Difficulty walking around block or climbing stairs [ ]  Pulmonary: Cough [ ]  Wheezing[ ]   SOB [ ]   Pleurisy [ ]  Asthma [ ]  GI: Nausea [ ]  Vomiting[ ]  Dysphagia[ ]  Heartburn[ ]  Abdominal pain [ ]  Constipation [ ] ; Diarrhea [ x] BRBPR [ ]  Melena[ ]  Bloating [ ]  Hemorrhoids [ ]  Incontinence [ ]  GU: Hematuria[ ]  Dysuria [ ]  Nocturia[ ]  Urgency [ ]   Hesitancy [ ]  Discharge [ ]  Frequency [ ]  Incontinence [ ]  Breast:  Dimpling [ ]  Breast lumps [ ]   Breast Lesions [ ]  Nipple discharge [ ]    Neuro: Headaches[ ]  Vertigo[ ]  Paresthesias[ ]  Spasm [ ]  Speech changes [ ]  Incoordination [ ]  Dizziness [ ]  Numbness [ ]  Ortho: Arthritis [ ]  Joint pain [ ]  Muscle pain [ ]  Joint swelling [ ]  Back Pain [ ]  Weakness [ ]  Stiffness [ ]  Skin:  Rash [ ]   Pruritis [ ]  Change in skin lesion [ ]  Change in hair [ ]  Change in nails [ ]  Psych: Depression[ ]  Anxiety[ ]  Stress [ ]  Confusion [ ]  Memory loss [ ]   Heme/Lymph: Bleeding [ ]  Bruising [ ]  History of anemia [ ]  Enlarged lymph nodes [ ]   Endocrine: Visual blurring [ ]  Paresthesia [ ]  Polyuria [ ]  Polydipsia [ ]  Polyphagia [ ]  Heat/cold intolerance [ ]  Hypoglycemia [ ]  Thyroid Issues [ ]  Diabetes [ ]   Physical Exam: Estimated body mass index is 46.66 kg/(m^2) as calculated from the following:   Height as of 03/23/13: 5' 0.5" (1.537 m).   Weight as of this encounter: 243 lb (110.224 kg). BP 128/84  Pulse 66  Temp(Src) 98 F (36.7 C) (Temporal)  Resp 16  Wt 243 lb (110.224 kg) General Appearance: Well nourished, in no apparent distress.  Eyes: PERRLA, EOMs, conjunctiva no swelling or erythema, normal fundi and vessels.  Sinuses: No Frontal/maxillary tenderness  ENT/Mouth: Ext  aud canals clear, normal light reflex with TMs without erythema, bulging. Good dentition. No erythema, swelling, or exudate on post pharynx. Tonsils not swollen or erythematous.  Hearing normal.  Neck: Supple, thyroid normal. No bruits  Respiratory: Respiratory effort normal, BS equal bilaterally without rales, rhonchi, wheezing or stridor.  Cardio: RRR without murmurs, rubs or gallops. Brisk peripheral pulses without edema.  Chest: symmetric, with normal excursions and percussion.  Breasts: Symmetric, without lumps, nipple discharge, retractions.  Abdomen: Soft, nontender, obese, no guarding, rebound, hernias, masses, or organomegaly. .  Lymphatics: Non tender without lymphadenopathy.  Genitourinary: normal external genitalia, vulva, vagina, cervix, uterus and adnexa, PAP: Pap smear done today. Musculoskeletal: Full ROM all peripheral extremities,5/5 strength, and normal gait.  Skin: Warm, dry without rashes, lesions, ecchymosis. Neuro: Cranial nerves intact, reflexes equal bilaterally. Normal muscle tone, no cerebellar symptoms. Sensation intact.  Psych: Awake and oriented X 3, normal affect, Insight and Judgment appropriate.   EKG: WNL no changes. AORTA SCAN: WNL    Quentin Mulling 9:47 AM [facility]

## 2013-12-22 LAB — LIPID PANEL
CHOL/HDL RATIO: 3.3 ratio
CHOLESTEROL: 162 mg/dL (ref 0–200)
HDL: 49 mg/dL (ref >39)
LDL CALC: 79 mg/dL (ref 0–99)
Triglycerides: 172 mg/dL — ABNORMAL HIGH (ref <150)
VLDL: 34 mg/dL (ref 0–40)

## 2013-12-22 LAB — BASIC METABOLIC PANEL WITH GFR
BUN: 12 mg/dL (ref 6–23)
CHLORIDE: 103 meq/L (ref 96–112)
CO2: 25 meq/L (ref 19–32)
Calcium: 9.8 mg/dL (ref 8.4–10.5)
Creat: 0.67 mg/dL (ref 0.50–1.10)
GFR, Est African American: >89 mL/min
Glucose, Bld: 85 mg/dL (ref 70–99)
Potassium: 4.7 mEq/L (ref 3.5–5.3)
Sodium: 140 mEq/L (ref 135–145)

## 2013-12-22 LAB — IRON AND TIBC
%SAT: 27 % (ref 20–55)
Iron: 80 ug/dL (ref 42–145)
TIBC: 291 ug/dL (ref 250–470)
UIBC: 211 ug/dL (ref 125–400)

## 2013-12-22 LAB — HEPATIC FUNCTION PANEL
ALT: 25 U/L (ref 0–35)
AST: 23 U/L (ref 0–37)
Albumin: 4.5 g/dL (ref 3.5–5.2)
Alkaline Phosphatase: 96 U/L (ref 39–117)
BILIRUBIN DIRECT: 0.1 mg/dL (ref 0.0–0.3)
BILIRUBIN TOTAL: 0.5 mg/dL (ref 0.2–1.2)
Indirect Bilirubin: 0.4 mg/dL (ref 0.2–1.2)
Total Protein: 6.8 g/dL (ref 6.0–8.3)

## 2013-12-22 LAB — INSULIN, FASTING: Insulin fasting, serum: 11.3 u[IU]/mL (ref 2.0–19.6)

## 2013-12-22 LAB — TSH: TSH: 1.249 u[IU]/mL (ref 0.350–4.500)

## 2013-12-22 LAB — VITAMIN D 25 HYDROXY (VIT D DEFICIENCY, FRACTURES): Vit D, 25-Hydroxy: 87 ng/mL (ref 30–89)

## 2013-12-22 LAB — VITAMIN B12: VITAMIN B 12: 1491 pg/mL — AB (ref 211–911)

## 2013-12-22 LAB — FERRITIN: Ferritin: 225 ng/mL (ref 10–291)

## 2013-12-22 LAB — MAGNESIUM: Magnesium: 1.7 mg/dL (ref 1.5–2.5)

## 2013-12-24 LAB — URINALYSIS, ROUTINE W REFLEX MICROSCOPIC

## 2013-12-24 LAB — MICROALBUMIN / CREATININE URINE RATIO

## 2013-12-25 LAB — CYTOLOGY - PAP

## 2014-02-11 ENCOUNTER — Other Ambulatory Visit: Payer: Self-pay | Admitting: Physician Assistant

## 2014-02-11 ENCOUNTER — Other Ambulatory Visit: Payer: Self-pay | Admitting: Emergency Medicine

## 2014-02-25 ENCOUNTER — Other Ambulatory Visit: Payer: Self-pay | Admitting: Physician Assistant

## 2014-03-01 NOTE — Telephone Encounter (Signed)
PLEASE CALL AND MAKE A 3MTH OV, NOT LAB ONLY PER MS & AC  Thank you, Katrina Eliseo [facility] [phone] Fax [phone]

## 2014-03-19 ENCOUNTER — Other Ambulatory Visit: Payer: Self-pay | Admitting: Physician Assistant

## 2014-03-20 ENCOUNTER — Ambulatory Visit (INDEPENDENT_AMBULATORY_CARE_PROVIDER_SITE_OTHER): Payer: Commercial Managed Care - PPO | Admitting: Physician Assistant

## 2014-03-20 ENCOUNTER — Encounter: Payer: Self-pay | Admitting: Physician Assistant

## 2014-03-20 VITALS — BP 138/82 | HR 60 | Temp 97.3°F | Resp 16 | Ht 60.5 in | Wt 242.0 lb

## 2014-03-20 DIAGNOSIS — R7303 Prediabetes: Secondary | ICD-10-CM

## 2014-03-20 DIAGNOSIS — E559 Vitamin D deficiency, unspecified: Secondary | ICD-10-CM | POA: Insufficient documentation

## 2014-03-20 DIAGNOSIS — I10 Essential (primary) hypertension: Secondary | ICD-10-CM

## 2014-03-20 DIAGNOSIS — E7800 Pure hypercholesterolemia, unspecified: Secondary | ICD-10-CM

## 2014-03-20 DIAGNOSIS — E039 Hypothyroidism, unspecified: Secondary | ICD-10-CM

## 2014-03-20 DIAGNOSIS — Z79899 Other long term (current) drug therapy: Secondary | ICD-10-CM | POA: Insufficient documentation

## 2014-03-20 DIAGNOSIS — Z1211 Encounter for screening for malignant neoplasm of colon: Secondary | ICD-10-CM

## 2014-03-20 DIAGNOSIS — E669 Obesity, unspecified: Secondary | ICD-10-CM

## 2014-03-20 DIAGNOSIS — R059 Cough, unspecified: Secondary | ICD-10-CM

## 2014-03-20 DIAGNOSIS — R7309 Other abnormal glucose: Secondary | ICD-10-CM

## 2014-03-20 DIAGNOSIS — R05 Cough: Secondary | ICD-10-CM

## 2014-03-20 LAB — LIPID PANEL
Cholesterol: 143 mg/dL (ref 0–200)
HDL: 47 mg/dL (ref >39)
LDL Cholesterol: 73 mg/dL (ref 0–99)
Total CHOL/HDL Ratio: 3 Ratio
Triglycerides: 114 mg/dL (ref <150)
VLDL: 23 mg/dL (ref 0–40)

## 2014-03-20 LAB — CBC WITH DIFFERENTIAL/PLATELET
BASOS ABS: 0.1 10*3/uL (ref 0.0–0.1)
Basophils Relative: 1 % (ref 0–1)
Eosinophils Absolute: 0.6 10*3/uL (ref 0.0–0.7)
Eosinophils Relative: 5 % (ref 0–5)
HEMATOCRIT: 41.4 % (ref 36.0–46.0)
HEMOGLOBIN: 14.1 g/dL (ref 12.0–15.0)
Lymphocytes Relative: 33 % (ref 12–46)
Lymphs Abs: 3.8 10*3/uL (ref 0.7–4.0)
MCH: 29.1 pg (ref 26.0–34.0)
MCHC: 34.1 g/dL (ref 30.0–36.0)
MCV: 85.5 fL (ref 78.0–100.0)
MPV: 10.9 fL (ref 8.6–12.4)
Monocytes Absolute: 0.7 10*3/uL (ref 0.1–1.0)
Monocytes Relative: 6 % (ref 3–12)
Neutro Abs: 6.3 10*3/uL (ref 1.7–7.7)
Neutrophils Relative %: 55 % (ref 43–77)
Platelets: 301 10*3/uL (ref 150–400)
RBC: 4.84 MIL/uL (ref 3.87–5.11)
RDW: 13.2 % (ref 11.5–15.5)
WBC: 11.5 10*3/uL — ABNORMAL HIGH (ref 4.0–10.5)

## 2014-03-20 LAB — HEPATIC FUNCTION PANEL
ALK PHOS: 86 U/L (ref 39–117)
ALT: 19 U/L (ref 0–35)
AST: 17 U/L (ref 0–37)
Albumin: 4.4 g/dL (ref 3.5–5.2)
Bilirubin, Direct: 0.1 mg/dL (ref 0.0–0.3)
Indirect Bilirubin: 0.4 mg/dL (ref 0.2–1.2)
Total Bilirubin: 0.5 mg/dL (ref 0.2–1.2)
Total Protein: 7 g/dL (ref 6.0–8.3)

## 2014-03-20 LAB — BASIC METABOLIC PANEL WITH GFR
BUN: 11 mg/dL (ref 6–23)
CO2: 26 mEq/L (ref 19–32)
Calcium: 9.9 mg/dL (ref 8.4–10.5)
Chloride: 100 mEq/L (ref 96–112)
Creat: 0.7 mg/dL (ref 0.50–1.10)
GFR, Est African American: >89 mL/min
Glucose, Bld: 84 mg/dL (ref 70–99)
Potassium: 4.7 mEq/L (ref 3.5–5.3)
Sodium: 138 mEq/L (ref 135–145)

## 2014-03-20 LAB — HEMOGLOBIN A1C
HEMOGLOBIN A1C: 5.8 % — AB (ref <5.7)
MEAN PLASMA GLUCOSE: 120 mg/dL — AB (ref <117)

## 2014-03-20 LAB — VITAMIN D 25 HYDROXY (VIT D DEFICIENCY, FRACTURES): Vit D, 25-Hydroxy: 61 ng/mL (ref 30–100)

## 2014-03-20 LAB — MAGNESIUM: MAGNESIUM: 1.5 mg/dL (ref 1.5–2.5)

## 2014-03-20 LAB — TSH: TSH: 1.634 u[IU]/mL (ref 0.350–4.500)

## 2014-03-20 MED ORDER — ALBUTEROL SULFATE HFA 108 (90 BASE) MCG/ACT IN AERS
2.0000 | INHALATION_SPRAY | Freq: Four times a day (QID) | RESPIRATORY_TRACT | Status: DC | PRN
Start: 2014-03-20 — End: 2017-04-11

## 2014-03-20 MED ORDER — TRAMADOL HCL 50 MG PO TABS: 50.0000 mg | ORAL_TABLET | Freq: Four times a day (QID) | ORAL | Status: DC | PRN

## 2014-03-20 NOTE — Patient Instructions (Signed)
Four common causes of cough:  Allergies, Viral Infections, Acid Reflux and Bacterial Infections. 1) Allergies and viral infections cause a cough by post nasal drip and are often worse at night, can also have sneezing, lower grade fevers, clear/yellow mucus. This is best treated with allergy medications or nasal sprays.  2) Bacterial infections are more severe than allergies or viral infections with fever, teeth pain, fatigue. This can be treated with prednisone and the same over the counter medication and after 7 days an antibiotic.  3) Silent reflux/GERD can cause a cough WITHOUT heart burn because the esophagus that goes to the stomach and trachea that goes to the lungs are very close and when you lay down the acid can irritate your throat and lungs. This can cause hoarseness, cough, and wheezing. Please stop any alcohol or anti-inflammatories like aleve/advil/ibuprofen and start an over the counter Prilosec or omeprazole 1-2 times daily before food for 2 weeks, then switch to over the counter zantac/ratinidine or pepcid/famotadine once at night for 2 weeks.   4) sometimes irritation causes more irritation. Try voice rest, use sugar free cough drops to prevent coughing, and try to stop clearing your throat.   If you ever have a cough that does not go away after trying these things please make a follow up visit for further evaluation or we can refer you to a specialist. Or if you ever have shortness of breath or chest pain go to the ER.    Before you even begin to attack a weight-loss plan, it pays to remember this: You are not fat. You have fat. Losing weight isn't about blame or shame; it's simply another achievement to accomplish. Dieting is like any other skill-you have to buckle down and work at it. As long as you act in a smart, reasonable way, you'll ultimately get where you want to be. Here are some weight loss pearls for you.  1. It's Not a Diet. It's a Lifestyle Thinking of a diet as  something you're on and suffering through only for the short term doesn't work. To shed weight and keep it off, you need to make permanent changes to the way you eat. It's OK to indulge occasionally, of course, but if you cut calories temporarily and then revert to your old way of eating, you'll gain back the weight quicker than you can say yo-yo. Use it to lose it. Research shows that one of the best predictors of long-term weight loss is how many pounds you drop in the first month. For that reason, nutritionists often suggest being stricter for the first two weeks of your new eating strategy to build momentum. Cut out added sugar and alcohol and avoid unrefined carbs. After that, figure out how you can reincorporate them in a way that's healthy and maintainable.  2. There's a Right Way to Exercise Working out burns calories and fat and boosts your metabolism by building muscle. But those trying to lose weight are notorious for overestimating the number of calories they burn and underestimating the amount they take in. Unfortunately, your system is biologically programmed to hold on to extra pounds and that means when you start exercising, your body senses the deficit and ramps up its hunger signals. If you're not diligent, you'll eat everything you burn and then some. Use it to lose it. Cardio gets all the exercise glory, but strength and interval training are the real heroes. They help you build lean muscle, which in turn increases your metabolism  and calorie-burning ability 3. Don't Overreact to Mild Hunger Some people have a hard time losing weight because of hunger anxiety. To them, being hungry is bad-something to be avoided at all costs-so they carry snacks with them and eat when they don't need to. Others eat because they're stressed out or bored. While you never want to get to the point of being ravenous (that's when bingeing is likely to happen), a hunger pang, a craving, or the fact that it's 3:00  p.m. should not send you racing for the vending machine or obsessing about the energy bar in your purse. Ideally, you should put off eating until your stomach is growling and it's difficult to concentrate.  Use it to lose it. When you feel the urge to eat, use the HALT method. Ask yourself, Am I really hungry? Or am I angry or anxious, lonely or bored, or tired? If you're still not certain, try the apple test. If you're truly hungry, an apple should seem delicious; if it doesn't, something else is going on. Or you can try drinking water and making yourself busy, if you are still hungry try a healthy snack.  4. Not All Calories Are Created Equal The mechanics of weight loss are pretty simple: Take in fewer calories than you use for energy. But the kind of food you eat makes all the difference. Processed food that's high in saturated fat and refined starch or sugar can cause inflammation that disrupts the hormone signals that tell your brain you're full. The result: You eat a lot more.  Use it to lose it. Clean up your diet. Swap in whole, unprocessed foods, including vegetables, lean protein, and healthy fats that will fill you up and give you the biggest nutritional bang for your calorie buck. In a few weeks, as your brain starts receiving regular hunger and fullness signals once again, you'll notice that you feel less hungry overall and naturally start cutting back on the amount you eat.  5. Protein, Produce, and Plant-Based Fats Are Your Weight-Loss Trinity Here's why eating the three Ps regularly will help you drop pounds. Protein fills you up. You need it to build lean muscle, which keeps your metabolism humming so that you can torch more fat. People in a weight-loss program who ate double the recommended daily allowance for protein (about 110 grams for a 150-pound woman) lost 70 percent of their weight from fat, while people who ate the RDA lost only about 40 percent, one study found. Produce is packed  with filling fiber. "It's very difficult to consume too many calories if you're eating a lot of vegetables. Example: Three cups of broccoli is a lot of food, yet only 93 calories. (Fruit is another story. It can be easy to overeat and can contain a lot of calories from sugar, so be sure to monitor your intake.) Plant-based fats like olive oil and those in avocados and nuts are healthy and extra satiating.  Use it to lose it. Aim to incorporate each of the three Ps into every meal and snack. People who eat protein throughout the day are able to keep weight off, according to a study in the American Journal of Clinical Nutrition. In addition to meat, poultry and seafood, good sources are beans, lentils, eggs, tofu, and yogurt. As for fat, keep portion sizes in check by measuring out salad dressing, oil, and nut butters (shoot for one to two tablespoons). Finally, eat veggies or a little fruit at every meal. People who  did that consumed 308 fewer calories but didn't feel any hungrier than when they didn't eat more produce.  7. How You Eat Is As Important As What You Eat In order for your brain to register that you're full, you need to focus on what you're eating. Sit down whenever you eat, preferably at a table. Turn off the TV or computer, put down your phone, and look at your food. Smell it. Chew slowly, and don't put another bite on your fork until you swallow. When women ate lunch this attentively, they consumed 30 percent less when snacking later than those who listened to an audiobook at lunchtime, according to a study in the [place] Journal of Nutrition. 8. Weighing Yourself Really Works The scale provides the best evidence about whether your efforts are paying off. Seeing the numbers tick up or down or stagnate is motivation to keep going-or to rethink your approach. A 2015 study at [facility] found that daily weigh-ins helped people lose more weight, keep it off, and maintain that loss, even  after two years. Use it to lose it. Step on the scale at the same time every day for the best results. If your weight shoots up several pounds from one weigh-in to the next, don't freak out. Eating a lot of salt the night before or having your period is the likely culprit. The number should return to normal in a day or two. It's a steady climb that you need to do something about. 9. Too Much Stress and Too Little Sleep Are Your Enemies When you're tired and frazzled, your body cranks up the production of cortisol, the stress hormone that can cause carb cravings. Not getting enough sleep also boosts your levels of ghrelin, a hormone associated with hunger, while suppressing leptin, a hormone that signals fullness and satiety. People on a diet who slept only five and a half hours a night for two weeks lost 55 percent less fat and were hungrier than those who slept eight and a half hours, according to a study in the [place] [employer] Journal. Use it to lose it. Prioritize sleep, aiming for seven hours or more a night, which research shows helps lower stress. And make sure you're getting quality zzz's. If a snoring spouse or a fidgety cat wakes you up frequently throughout the night, you may end up getting the equivalent of just four hours of sleep, according to a study from [facility]. Keep pets out of the bedroom, and use a white-noise app to drown out snoring. 10. You Will Hit a plateau-And You Can Bust Through It As you slim down, your body releases much less leptin, the fullness hormone.  If you're not strength training, start right now. Building muscle can raise your metabolism to help you overcome a plateau. To keep your body challenged and burning calories, incorporate new moves and more intense intervals into your workouts or add another sweat session to your weekly routine. Alternatively, cut an extra 100 calories or so a day from your diet. Now that you've lost weight, your body  simply doesn't need as much fuel.   Ways to cut 100 calories  1. Eat your eggs with hot sauce OR salsa instead of cheese.  Eggs are great for breakfast, but many people consider eggs and cheese to be BFFs. Instead of cheese-1 oz. of cheddar has 114 calories-top your eggs with hot sauce, which contains no calories and helps with satiety and metabolism. Pryor Montes is also a great  option!!  2. Top your toast, waffles or pancakes with mashed berries instead of jelly or syrup. Half a cup of berries-fresh, frozen or thawed-has about 40 calories, compared with 2 tbsp. of maple syrup or jelly, which both have about 100 calories. The berries will also give you a good punch of fiber, which helps keep you full and satisfied and won't spike blood sugar quickly like the jelly or syrup. 3. Swap the non-fat latte for black coffee with a splash of half-and-half. Contrary to its name, that non-fat latte has 130 calories and a startling 19g of carbohydrates per 16 oz. serving. Replacing that 'light' drinkable dessert with a black coffee with a splash of half-and-half saves you more than 100 calories per 16 oz. serving. 4. Sprinkle salads with freeze-dried raspberries instead of dried cranberries. If you want a sweet addition to your nutritious salad, stay away from dried cranberries. They have a whopping 130 calories per  cup and 30g carbohydrates. Instead, sprinkle freeze-dried raspberries guilt-free and save more than 100 calories per  cup serving, adding 3g of belly-filling fiber. 5. Go for mustard in place of mayo on your sandwich. Mustard can add really nice flavor to any sandwich, and there are tons of varieties, from spicy to honey. A serving of mayo is 95 calories, versus 10 calories in a serving of mustard. 6. Choose a DIY salad dressing instead of the store-bought kind. Mix Dijon or whole grain mustard with low-fat Kefir or red wine vinegar and garlic. 7. Use hummus as a spread instead of a dip. Use hummus as a  spread on a high-fiber cracker or tortilla with a sandwich and save on calories without sacrificing taste. 8. Pick just one salad "accessory." Salad isn't automatically a calorie winner. It's easy to over-accessorize with toppings. Instead of topping your salad with nuts, avocado and cranberries (all three will clock in at 313 calories), just pick one. The next day, choose a different accessory, which will also keep your salad interesting. You don't wear all your jewelry every day, right? 9. Ditch the white pasta in favor of spaghetti squash. One cup of cooked spaghetti squash has about 40 calories, compared with traditional spaghetti, which comes with more than 200. Spaghetti squash is also nutrient-dense. It's a good source of fiber and Vitamins A and C, and it can be eaten just like you would eat pasta-with a great tomato sauce and [place] meatballs or with pesto, tofu and spinach, for example. 10. Dress up your chili, soups and stews with non-fat [place] yogurt instead of sour cream. Just a 'dollop' of sour cream can set you back 115 calories and a whopping 12g of fat-seven of which are of the artery-clogging variety. Added bonus: [place] yogurt is packed with muscle-building protein, calcium and B Vitamins. 11. Mash cauliflower instead of mashed potatoes. One cup of traditional mashed potatoes-in all their creamy goodness-has more than 200 calories, compared to mashed cauliflower, which you can typically eat for less than 100 calories per 1 cup serving. Cauliflower is a great source of the antioxidant indole-3-carbinol (I3C), which may help reduce the risk of some cancers, like breast cancer. 12. Ditch the ice cream sundae in favor of a [place] yogurt parfait. Instead of a cup of ice cream or fro-yo for dessert, try 1 cup of nonfat Greek yogurt topped with fresh berries and a sprinkle of cacao nibs. Both toppings are packed with antioxidants, which can help reduce cellular inflammation and oxidative damage.  And the comparison is a  no-brainer: One cup of ice cream has about 275 calories; one cup of frozen yogurt has about 230; and a cup of Greek yogurt has just 130, plus twice the protein, so you're less likely to return to the freezer for a second helping. 13. Put olive oil in a spray container instead of using it directly from the bottle. Each tablespoon of olive oil is 120 calories and 15g of fat. Use a mister instead of pouring it straight into the pan or onto a salad. This allows for portion control and will save you more than 100 calories. 14. When baking, substitute canned pumpkin for butter or oil. Canned pumpkin-not pumpkin pie mix-is loaded with Vitamin A, which is important for skin and eye health, as well as immunity. And the comparisons are pretty crazy:  cup of canned pumpkin has about 40 calories, compared to butter or oil, which has more than 800 calories. Yes, 800 calories. Applesauce and mashed banana can also serve as good substitutions for butter or oil, usually in a 1:1 ratio. 15. Top casseroles with high-fiber cereal instead of breadcrumbs. Breadcrumbs are typically made with white bread, while breakfast cereals contain 5-9g of fiber per serving. Not only will you save more than 150 calories per  cup serving, the swap will also keep you more full and you'll get a metabolism boost from the added fiber. 16. Snack on pistachios instead of macadamia nuts. Believe it or not, you get the same amount of calories from 35 pistachios (100 calories) as you would from only five macadamia nuts. 17. Chow down on kale chips rather than potato chips. This is my favorite 'don't knock it 'till you try it' swap. Kale chips are so easy to make at home, and you can spice them up with a little grated parmesan or chili powder. Plus, they're a mere fraction of the calories of potato chips, but with the same crunch factor we crave so often. 18. Add seltzer and some fruit slices to your cocktail instead of soda  or fruit juice. One cup of soda or fruit juice can pack on as much as 140 calories. Instead, use seltzer and fruit slices. The fruit provides valuable phytochemicals, such as flavonoids and anthocyanins, which help to combat cancer and stave off the aging process.

## 2014-03-20 NOTE — Progress Notes (Signed)
Assessment and Plan:  Hypertension: Continue medication, monitor blood pressure at home. Continue DASH diet.  Reminder to go to the ER if any CP, SOB, nausea, dizziness, severe HA, changes vision/speech, left arm numbness and tingling, and jaw pain. Cholesterol: Continue diet and exercise. Check cholesterol.  Pre-diabetes-Continue diet and exercise. Check A1C Vitamin D Def- check level and continue medications.  Hypothyroidism-check TSH level, continue medications the same, reminded to take on an empty stomach 30-7060mins before food.  Obesity with co morbidities- long discussion about weight loss, diet, and exercise Diarrhea/need colonoscopy- will send another referral for colonoscopy, Dr. Madilyn FiremanHayes.  Cough- multifactorial-  get on PPI, may need to add H2/refer GI. Get on allergy pill, voice rest, suppress cough with OTC sugar free candy and tramadol. ? Need to switch propanolol to bisoprolol or stop the midamor.   Continue diet and meds as discussed. Further disposition pending results of labs.  HPI 54 y.o. female  presents for 3 month follow up with hypertension, hyperlipidemia, prediabetes and vitamin D.  Her blood pressure has been controlled at home, today their BP is BP: 138/82 mmHg  She does not workout due to knee pain. She denies chest pain, shortness of breath, dizziness.   She is on cholesterol medication,lipitor 20 1/2 pill daily and denies myalgias. Her cholesterol is at goal. The cholesterol last visit was:   Lab Results  Component Value Date   CHOL 162 12/21/2013   HDL 49 12/21/2013   LDLCALC 79 12/21/2013   TRIG 172* 12/21/2013   CHOLHDL 3.3 12/21/2013   She has been working on diet and exercise for prediabetes, and denies paresthesia of the feet, polydipsia, polyuria and visual disturbances. Last A1C in the office was:  Lab Results  Component Value Date   HGBA1C 6.0* 12/21/2013  Patient is on Vitamin D supplement, 5000 IU daily. .   Lab Results  Component Value Date   VD25OH 87 12/21/2013  She is on thyroid medication. Her medication was not changed last visit.  Lab Results  Component Value Date   TSH 1.249 12/21/2013  She has had cold symptoms since Nov, has been doing OTC meds which has helped, but continues with cough since middle Dec, very small amount of clear mucus now, mostly dry "hacking" cough worse at night, with mild wheezing. Had something similar last year, tramadol helped. She is still on flonase, allergy pill, she is out of albuterol. Denies fever, chills. Has been having right ear pain worse with coughing. Denies changes in vision, swallowing, speech.  She has occ diarrhea, has not had colonoscopy, sent in referral in Oct but for some reason it was canceled, will refer again.  BMI is Body mass index is 46.47 kg/(m^2)., she is working on diet and exercise. Wt Readings from Last 3 Encounters:  03/20/14 242 lb (109.77 kg)  12/21/13 243 lb (110.224 kg)  06/15/13 239 lb (108.41 kg)     Current Medications:  Current Outpatient Prescriptions on File Prior to Visit  Medication Sig Dispense Refill  . albuterol (PROVENTIL HFA;VENTOLIN HFA) 108 (90 BASE) MCG/ACT inhaler Inhale 2 puffs into the lungs every 6 (six) hours as needed for wheezing or shortness of breath.    Marland Kitchen. aMILoride (MIDAMOR) 5 MG tablet TAKE 1 TABLET BY MOUTH EVERY MORNING. 90 tablet 0  . Ascorbic Acid (VITAMIN C) 1000 MG tablet Take 1,000 mg by mouth daily.    Marland Kitchen. aspirin 325 MG EC tablet Take 325 mg by mouth every morning.     .Marland Kitchen  atorvastatin (LIPITOR) 20 MG tablet Take 20 mg by mouth daily.    . candesartan (ATACAND) 32 MG tablet TAKE 1/2-1 TABLET BY MOUTH AT NIGHT AS DIRECTED FOR BLOOD PRESSURE 90 tablet 0  . cetirizine (ZYRTEC) 10 MG tablet Take 10 mg by mouth daily.    . Cholecalciferol (VITAMIN D-3) 5000 UNITS TABS Take 5,000 Units by mouth daily.    . cyanocobalamin 2000 MCG tablet Take 2,000 mcg by mouth daily.    Marland Kitchen escitalopram (LEXAPRO) 20 MG tablet Take 1 tablet (20 mg  total) by mouth daily. 30 tablet 2  . esomeprazole (NEXIUM) 40 MG capsule TAKE 1 CAPSULE BY MOUTH DAILY. 90 capsule 3  . famciclovir (FAMVIR) 500 MG tablet Take 500 mg by mouth 3 (three) times daily. Take AD for fever blister    . fluconazole (DIFLUCAN) 150 MG tablet Take 150 mg by mouth once a week. PRN yeast    . fluticasone (FLONASE) 50 MCG/ACT nasal spray Place 1 spray into both nostrils daily. 16 g 2  . Glucosamine-Chondroitin (GLUCOSAMINE CHONDR COMPLEX PO) Take 1,500 mg by mouth. /1200mg   Actual dose    . levothyroxine (SYNTHROID, LEVOTHROID) 137 MCG tablet Take 137 mcg by mouth daily before breakfast.    . metFORMIN (GLUCOPHAGE-XR) 500 MG 24 hr tablet TAKE 2 TABLETS BY MOUTH TWICE DAILY 360 tablet 3  . propranolol (INDERAL) 80 MG tablet TAKE 1 TABLET BY MOUTH TWICE DAILY 180 tablet 1  . traMADol (ULTRAM) 50 MG tablet Take 1 tablet (50 mg total) by mouth every 6 (six) hours as needed. 30 tablet 0   No current facility-administered medications on file prior to visit.   Medical History:  Past Medical History  Diagnosis Date  . Multinodular goiter   . High cholesterol   . Hypothyroidism   . GERD (gastroesophageal reflux disease)   . Hypertension    Allergies:  Allergies  Allergen Reactions  . Avelox [Moxifloxacin Hcl In Nacl] Hives    Thrush, throat might have closed   . Hydrochlorothiazide Itching    On hands and feet  . Other Itching    All "Cillins", hives alao  . Penicillins     Hives  . Prednisone     Chest pains  . Zostavax [Zoster Vaccine Live]   . Codeine Hives, Itching and Rash  . Levothyroxine Rash    Only to generic. Not to brand synthroid.   . Sulfa Antibiotics Itching and Rash    Hands and feet itch and turn red     Review of Systems:  Review of Systems  Constitutional: Negative.   HENT: Positive for congestion and ear pain (right).   Eyes: Negative.   Respiratory: Positive for cough and wheezing. Negative for hemoptysis, sputum production and  shortness of breath.   Cardiovascular: Negative.   Gastrointestinal: Positive for diarrhea (occ with metformin). Negative for heartburn, nausea, vomiting, abdominal pain, constipation, blood in stool and melena.  Genitourinary: Negative.   Musculoskeletal: Negative.   Skin: Negative.   Neurological: Negative.   Endo/Heme/Allergies: Negative.   Psychiatric/Behavioral: Negative.      Family history- Review and unchanged Social history- Review and unchanged Physical Exam: BP 138/82 mmHg  Pulse 60  Temp(Src) 97.3 F (36.3 C)  Resp 16  Ht 5' 0.5" (1.537 m)  Wt 242 lb (109.77 kg)  BMI 46.47 kg/m2 Wt Readings from Last 3 Encounters:  03/20/14 242 lb (109.77 kg)  12/21/13 243 lb (110.224 kg)  06/15/13 239 lb (108.41 kg)   General Appearance:  Well nourished, in no apparent distress. Eyes: PERRLA, EOMs, conjunctiva no swelling or erythema Sinuses: NO Frontal/maxillary tenderness ENT/Mouth: Ext aud canals clear, TMs without erythema,+ effusions without bulging. No erythema, swelling, or exudate on post pharynx.  Tonsils not swollen or erythematous. Hearing normal.  Neck: Supple, thyroid normal.  Respiratory: Respiratory effort normal, BS equal bilaterally without rales, rhonchi, wheezing or stridor.  Cardio: RRR with no MRGs. Brisk peripheral pulses without edema.  Abdomen: Soft, + BS, obese.  Non tender, no guarding, rebound, hernias, masses. Lymphatics: Non tender without lymphadenopathy.  Musculoskeletal: Full ROM, 5/5 strength, normal gait.  Skin: Warm, dry without rashes, lesions, ecchymosis.  Neuro: Cranial nerves intact. Normal muscle tone, no cerebellar symptoms. Sensation intact.  Psych: Awake and oriented X 3, normal affect, Insight and Judgment appropriate.    Quentin Mulling, PA-C 8:58 AM [facility]

## 2014-03-21 LAB — INSULIN, FASTING: INSULIN FASTING, SERUM: 12.4 u[IU]/mL (ref 2.0–19.6)

## 2014-03-25 ENCOUNTER — Other Ambulatory Visit: Payer: Self-pay | Admitting: Physician Assistant

## 2014-04-23 ENCOUNTER — Telehealth: Payer: Self-pay

## 2014-04-23 NOTE — Telephone Encounter (Signed)
Morrie [facility]Ashley at [facility] called requesting medical records for patient's upcoming appointment. Records were faxed Attn:Ashley [phone]) [phone].

## 2014-05-13 ENCOUNTER — Other Ambulatory Visit: Payer: Self-pay | Admitting: Physician Assistant

## 2014-05-14 ENCOUNTER — Other Ambulatory Visit: Payer: Self-pay | Admitting: Physician Assistant

## 2014-06-19 ENCOUNTER — Ambulatory Visit (INDEPENDENT_AMBULATORY_CARE_PROVIDER_SITE_OTHER): Payer: 59 | Admitting: Physician Assistant

## 2014-06-19 ENCOUNTER — Encounter: Payer: Self-pay | Admitting: Physician Assistant

## 2014-06-19 VITALS — BP 132/78 | HR 60 | Temp 97.7°F | Resp 16 | Ht 60.5 in | Wt 252.0 lb

## 2014-06-19 DIAGNOSIS — R7309 Other abnormal glucose: Secondary | ICD-10-CM

## 2014-06-19 DIAGNOSIS — R7303 Prediabetes: Secondary | ICD-10-CM

## 2014-06-19 DIAGNOSIS — Z79899 Other long term (current) drug therapy: Secondary | ICD-10-CM

## 2014-06-19 DIAGNOSIS — E559 Vitamin D deficiency, unspecified: Secondary | ICD-10-CM

## 2014-06-19 DIAGNOSIS — E7800 Pure hypercholesterolemia, unspecified: Secondary | ICD-10-CM

## 2014-06-19 DIAGNOSIS — E039 Hypothyroidism, unspecified: Secondary | ICD-10-CM

## 2014-06-19 DIAGNOSIS — I10 Essential (primary) hypertension: Secondary | ICD-10-CM

## 2014-06-19 LAB — CBC WITH DIFFERENTIAL/PLATELET
Basophils Absolute: 0.1 10*3/uL (ref 0.0–0.1)
Basophils Relative: 1 % (ref 0–1)
EOS PCT: 4 % (ref 0–5)
Eosinophils Absolute: 0.5 10*3/uL (ref 0.0–0.7)
HEMATOCRIT: 40.7 % (ref 36.0–46.0)
Hemoglobin: 13.3 g/dL (ref 12.0–15.0)
Lymphocytes Relative: 30 % (ref 12–46)
Lymphs Abs: 3.8 10*3/uL (ref 0.7–4.0)
MCH: 28.2 pg (ref 26.0–34.0)
MCHC: 32.7 g/dL (ref 30.0–36.0)
MCV: 86.2 fL (ref 78.0–100.0)
MONO ABS: 0.8 10*3/uL (ref 0.1–1.0)
MONOS PCT: 6 % (ref 3–12)
MPV: 10.6 fL (ref 8.6–12.4)
NEUTROS ABS: 7.4 10*3/uL (ref 1.7–7.7)
Neutrophils Relative %: 59 % (ref 43–77)
Platelets: 274 10*3/uL (ref 150–400)
RBC: 4.72 MIL/uL (ref 3.87–5.11)
RDW: 13.7 % (ref 11.5–15.5)
WBC: 12.6 10*3/uL — ABNORMAL HIGH (ref 4.0–10.5)

## 2014-06-19 MED ORDER — ESTRADIOL 1 MG PO TABS: ORAL_TABLET | ORAL | Status: DC

## 2014-06-19 NOTE — Progress Notes (Signed)
Assessment and Plan:  1. Hypertension -Continue medication, monitor blood pressure at home. Continue DASH diet.  Reminder to go to the ER if any CP, SOB, nausea, dizziness, severe HA, changes vision/speech, left arm numbness and tingling and jaw pain.  2. Cholesterol -Continue diet and exercise. Check cholesterol.   3. Prediabetes  -Continue diet and exercise. Check A1C  4. Vitamin D Def - check level and continue medications.   5. Morbid Obesity with co morbidities-  long discussion about weight loss, diet, and exercise  6. + post menopausal symptoms.  She has had a hysterectomy, we will start her on low dose estrogen. Risks of clotting and breast cancer were discussed and she understands. In addition, the cardiovascular,bone, and other benefits were discussed. She does not smoke. No personal history of breast CA or blood clots, she is on 325mg  aspirin and she will get mammograms regularly.   Get 6 weeks lab only LFTs   Continue diet and meds as discussed. Further disposition pending results of labs. Over 30 minutes of exam, counseling, chart review, and critical decision making was performed  HPI 54 y.o. female  presents for 3 month follow up on hypertension, cholesterol, prediabetes, and vitamin D deficiency.   Her blood pressure has been controlled at home, today their BP is BP: 132/78 mmHg  She does not workout. She denies chest pain, shortness of breath, dizziness.  She is on cholesterol medication, lipitor 20 1/2 pill daily and denies myalgias. Her cholesterol is at goal. The cholesterol last visit was:   Lab Results  Component Value Date   CHOL 143 03/20/2014   HDL 47 03/20/2014   LDLCALC 73 03/20/2014   TRIG 114 03/20/2014   CHOLHDL 3.0 03/20/2014   She has been working on diet and exercise for prediabetes, and denies paresthesia of the feet, polydipsia, polyuria and visual disturbances. Last A1C in the office was:  Lab Results  Component Value Date   HGBA1C 5.8*  03/20/2014  Patient is on Vitamin D supplement, 5000 IU dialy.    Lab Results  Component Value Date   VD25OH 1761 03/20/2014   She is on thyroid medication. Her medication was not changed last visit, follows with Dr. Sharl MaKerr.    Lab Results  Component Value Date   TSH 1.634 03/20/2014  She is complaining of hot flashes, is on lexapro and this has helped.  BMI is Body mass index is 48.39 kg/(m^2)., she is working on diet and exercise. Wt Readings from Last 3 Encounters:  06/19/14 252 lb (114.306 kg)  03/20/14 242 lb (109.77 kg)  12/21/13 243 lb (110.224 kg)     Current Medications:  Current Outpatient Prescriptions on File Prior to Visit  Medication Sig Dispense Refill  . albuterol (PROVENTIL HFA;VENTOLIN HFA) 108 (90 BASE) MCG/ACT inhaler Inhale 2 puffs into the lungs every 6 (six) hours as needed for wheezing or shortness of breath. 18 g 2  . aMILoride (MIDAMOR) 5 MG tablet TAKE 1 TABLET BY MOUTH EVERY MORNING. 90 tablet 1  . Ascorbic Acid (VITAMIN C) 1000 MG tablet Take 1,000 mg by mouth daily.    Marland Kitchen. aspirin 325 MG EC tablet Take 325 mg by mouth every morning.     Marland Kitchen. atorvastatin (LIPITOR) 20 MG tablet Take 20 mg by mouth daily.    . candesartan (ATACAND) 32 MG tablet TAKE 1/2 TO 1 TABLET BY MOUTH EVERY NIGHT AS DIRECTED FOR BLOOD PRESSURE 90 tablet 0  . cetirizine (ZYRTEC) 10 MG tablet Take 10 mg  by mouth daily.    . Cholecalciferol (VITAMIN D-3) 5000 UNITS TABS Take 5,000 Units by mouth daily.    . cyanocobalamin 2000 MCG tablet Take 2,000 mcg by mouth daily.    Marland Kitchen escitalopram (LEXAPRO) 20 MG tablet TAKE 1 TABLET BY MOUTH ONCE DAILY 90 tablet 0  . esomeprazole (NEXIUM) 40 MG capsule TAKE 1 CAPSULE BY MOUTH DAILY. 90 capsule 3  . famciclovir (FAMVIR) 500 MG tablet Take 500 mg by mouth 3 (three) times daily. Take AD for fever blister    . fluconazole (DIFLUCAN) 150 MG tablet Take 150 mg by mouth once a week. PRN yeast    . fluticasone (FLONASE) 50 MCG/ACT nasal spray Place 1 spray into  both nostrils daily. 16 g 2  . Glucosamine-Chondroitin (GLUCOSAMINE CHONDR COMPLEX PO) Take 1,500 mg by mouth. /1200mg   Actual dose    . levothyroxine (SYNTHROID, LEVOTHROID) 137 MCG tablet Take 137 mcg by mouth daily before breakfast.    . metFORMIN (GLUCOPHAGE-XR) 500 MG 24 hr tablet TAKE 2 TABLETS BY MOUTH TWICE DAILY 360 tablet 3  . propranolol (INDERAL) 80 MG tablet TAKE 1 TABLET BY MOUTH TWICE DAILY 180 tablet 1  . traMADol (ULTRAM) 50 MG tablet Take 1 tablet (50 mg total) by mouth every 6 (six) hours as needed (cough). 60 tablet 0   No current facility-administered medications on file prior to visit.   Medical History:  Past Medical History  Diagnosis Date  . Multinodular goiter   . High cholesterol   . Hypothyroidism   . GERD (gastroesophageal reflux disease)   . Hypertension    Allergies:  Allergies  Allergen Reactions  . Avelox [Moxifloxacin Hcl In Nacl] Hives    Thrush, throat might have closed   . Hydrochlorothiazide Itching    On hands and feet  . Other Itching    All "Cillins", hives alao  . Penicillins     Hives  . Prednisone     Chest pains  . Zostavax [Zoster Vaccine Live]   . Codeine Hives, Itching and Rash  . Levothyroxine Rash    Only to generic. Not to brand synthroid.   . Sulfa Antibiotics Itching and Rash    Hands and feet itch and turn red    Review of Systems:  Review of Systems  Constitutional: Negative.        + hot flashes  HENT: Negative.   Eyes: Negative.   Respiratory: Negative.   Cardiovascular: Positive for leg swelling. Negative for chest pain, palpitations, orthopnea, claudication and PND.  Gastrointestinal: Negative.   Genitourinary: Negative.   Musculoskeletal: Negative.   Skin: Negative.   Neurological: Negative.   Endo/Heme/Allergies: Negative.   Psychiatric/Behavioral: Negative.     Family history- Review and unchanged Social history- Review and unchanged Physical Exam: BP 132/78 mmHg  Pulse 60  Temp(Src) 97.7  F (36.5 C)  Resp 16  Ht 5' 0.5" (1.537 m)  Wt 252 lb (114.306 kg)  BMI 48.39 kg/m2 Wt Readings from Last 3 Encounters:  06/19/14 252 lb (114.306 kg)  03/20/14 242 lb (109.77 kg)  12/21/13 243 lb (110.224 kg)   General Appearance: Well nourished, in no apparent distress. Eyes: PERRLA, EOMs, conjunctiva no swelling or erythema Sinuses: No Frontal/maxillary tenderness ENT/Mouth: Ext aud canals clear, TMs without erythema, bulging. No erythema, swelling, or exudate on post pharynx.  Tonsils not swollen or erythematous. Hearing normal.  Neck: Supple, thyroid normal.  Respiratory: Respiratory effort normal, BS equal bilaterally without rales, rhonchi, wheezing or stridor.  Cardio:  RRR with no MRGs. Brisk peripheral pulses with 1+ edema.  Abdomen: Soft, + BS, obese,  Non tender, no guarding, rebound, hernias, masses. Lymphatics: Non tender without lymphadenopathy.  Musculoskeletal: Full ROM, 5/5 strength, Antalgic gait from her knee Skin: Warm, dry without rashes, lesions, ecchymosis.  Neuro: Cranial nerves intact. Normal muscle tone, no cerebellar symptoms. Psych: Awake and oriented X 3, normal affect, Insight and Judgment appropriate.    Quentin Mulling, PA-C 9:23 AM [facility]

## 2014-06-19 NOTE — Patient Instructions (Signed)
Evening primrose oil, vitamin E and black cohosh are OTC herbals which are probably safe and reasonably effective alternative treatments to HRT for estrogen deficiency symptoms, but are not known to prevent osteoporosis. She would like to try this.  Start on 1/2 of 1 mg, can go up to a whole.    Conjugated Estrogens tablets What is this medicine? CONJUGATED ESTROGENS (CON ju gate ed ESS troe jenz) is an estrogen. It is used as hormone replacement in menopausal women. It helps to treat hot flashes and prevent osteoporosis. It is also used to treat women with low hormone levels or in those who have had their ovaries removed. This medicine may be used for other purposes; ask your health care provider or pharmacist if you have questions. COMMON BRAND NAME(S): Premarin What should I tell my health care provider before I take this medicine? They need to know if you have any of these conditions: -abnormal vaginal bleeding -blood vessel disease or blood clots -breast, cervical, endometrial, ovarian, liver, or uterine cancer -dementia -diabetes -endometriosis -fibroids -gallbladder disease -heart disease or recent heart attack -high blood pressure -high cholesterol -high level of calcium in the blood -kidney disease -liver disease -mental depression -migraine headaches -protein C deficiency -protein S deficiency -stroke -tobacco smoker -an unusual or allergic reaction to estrogens, other medicines, foods, dyes, or preservatives -pregnant or trying to get pregnant -breast-feeding How should I use this medicine? Take this medicine by mouth with a glass of water. Follow the directions on the prescription label. Take your medicine at regular intervals, at the same time each day. Do not take your medicine more often than directed. A patient package insert for the product will be given with each prescription and refill. Read this sheet carefully each time. Talk to your pediatrician regarding  the use of this medicine in children. This medicine is not approved for use in children. Overdosage: If you think you have taken too much of this medicine contact a poison control center or emergency room at once. NOTE: This medicine is only for you. Do not share this medicine with others. What if I miss a dose? If you miss a dose, take it as soon as you can. If it is almost time for your next dose, take only that dose. Do not take double or extra doses. What may interact with this medicine? Do not take this medicine with any of the following medications: -aromatase inhibitors like aminoglutethimide, anastrozole, exemestane, letrozole, testolactone -metyrapone This medicine may also interact with the following medications: -barbiturates, such as phenobarbital -carbamazepine -clarithromycin -erythromycin -grapefruit juice -medicines for fungal infections like ketoconazole and itraconazole -phenytoin - rifampin -ritonavir -St. John's Wort -thyroid hormones This list may not describe all possible interactions. Give your health care provider a list of all the medicines, herbs, non-prescription drugs, or dietary supplements you use. Also tell them if you smoke, drink alcohol, or use illegal drugs. Some items may interact with your medicine. What should I watch for while using this medicine? Visit your health care professional for regular checks on your progress. You will need a regular breast and pelvic exam and Pap smear while on this medicine. You should also discuss the need for regular mammograms with your health care professional, and follow his or her guidelines for these tests. This medicine can make your body retain fluid, making your fingers, hands, or ankles swell. Your blood pressure can go up. Contact your doctor or health care professional if you feel you are retaining fluid. If  you have any reason to think you are pregnant; stop taking this medicine at once and contact your doctor  or health care professional. Smoking increases the risk of getting a blood clot or having a stroke while you are taking this medicine, especially if you are more than 55 years old. You are strongly advised not to smoke. If you wear contact lenses and notice visual changes, or if the lenses begin to feel uncomfortable, consult your eye care specialist. The tablet shell for some brands of this medicine does not dissolve. The tablet shell may appear whole in the stool. This is not a cause for concern. If you see something that resembles a tablet in your stool, talk to your healthcare provider. This medicine can increase the risk of developing a condition (endometrial hyperplasia) that may lead to cancer of the lining of the uterus. Taking progestins, another hormone drug, with this medicine lowers the risk of developing this condition. Therefore, if your uterus has not been removed (by a hysterectomy), your doctor may prescribe a progestin for you to take together with your estrogen. You should know, however, that taking estrogens with progestins may have additional health risks. You should discuss the use of estrogens and progestins with your health care professional to determine the benefits and risks for you. If you are going to have surgery, you may need to stop taking this medicine. Consult your health care professional for advice before you schedule the surgery. What side effects may I notice from receiving this medicine? Side effects that you should report to your doctor or health care professional as soon as possible: -allergic reactions like skin rash, itching or hives, swelling of the face, lips, or tongue -breast tissue changes or discharge -changes in vision -chest pain -confusion, trouble speaking or understanding -dark urine -general ill feeling or flu-like symptoms -light-colored stools -nausea, vomiting -pain, swelling, warmth in the leg -right upper belly pain -severe  headaches -shortness of breath -sudden numbness or weakness of the face, arm or leg -trouble walking, dizziness, loss of balance or coordination -unusual vaginal bleeding -yellowing of the eyes or skin Side effects that usually do not require medical attention (report to your doctor or health care professional if they continue or are bothersome): -hair loss -increased hunger or thirst -increased urination -symptoms of vaginal infection like itching, irritation or unusual discharge -unusually weak or tired This list may not describe all possible side effects. Call your doctor for medical advice about side effects. You may report side effects to FDA at 1-800-FDA-1088. Where should I keep my medicine? Keep out of the reach of children. Store at room temperature between 15 and 30 degrees C (59 and 86 degrees F). Throw away any unused medicine after the expiration date. NOTE: This sheet is a summary. It may not cover all possible information. If you have questions about this medicine, talk to your doctor, pharmacist, or health care provider.  2015, [employer]. (2010-05-13 09:20:56)

## 2014-06-20 LAB — MAGNESIUM: Magnesium: 1.6 mg/dL (ref 1.5–2.5)

## 2014-06-20 LAB — BASIC METABOLIC PANEL WITH GFR
BUN: 21 mg/dL (ref 6–23)
CHLORIDE: 102 meq/L (ref 96–112)
CO2: 20 meq/L (ref 19–32)
CREATININE: 0.78 mg/dL (ref 0.50–1.10)
Calcium: 9.7 mg/dL (ref 8.4–10.5)
GFR, EST NON AFRICAN AMERICAN: 86 mL/min
GFR, Est African American: >89 mL/min
GLUCOSE: 80 mg/dL (ref 70–99)
POTASSIUM: 4.8 meq/L (ref 3.5–5.3)
Sodium: 138 mEq/L (ref 135–145)

## 2014-06-20 LAB — LIPID PANEL
CHOL/HDL RATIO: 2.9 ratio
Cholesterol: 137 mg/dL (ref 0–200)
HDL: 47 mg/dL (ref >=46)
LDL Cholesterol: 61 mg/dL (ref 0–99)
Triglycerides: 144 mg/dL (ref <150)
VLDL: 29 mg/dL (ref 0–40)

## 2014-06-20 LAB — HEMOGLOBIN A1C
HEMOGLOBIN A1C: 6.2 % — AB (ref <5.7)
MEAN PLASMA GLUCOSE: 131 mg/dL — AB (ref <117)

## 2014-06-20 LAB — TSH: TSH: 0.556 u[IU]/mL (ref 0.350–4.500)

## 2014-06-20 LAB — HEPATIC FUNCTION PANEL
ALBUMIN: 4.2 g/dL (ref 3.5–5.2)
ALK PHOS: 85 U/L (ref 39–117)
ALT: 22 U/L (ref 0–35)
AST: 15 U/L (ref 0–37)
Bilirubin, Direct: 0.1 mg/dL (ref 0.0–0.3)
Indirect Bilirubin: 0.2 mg/dL (ref 0.2–1.2)
Total Bilirubin: 0.3 mg/dL (ref 0.2–1.2)
Total Protein: 6.5 g/dL (ref 6.0–8.3)

## 2014-06-20 LAB — VITAMIN D 25 HYDROXY (VIT D DEFICIENCY, FRACTURES): Vit D, 25-Hydroxy: 56 ng/mL (ref 30–100)

## 2014-06-20 LAB — INSULIN, FASTING: INSULIN FASTING, SERUM: 24.3 u[IU]/mL — AB (ref 2.0–19.6)

## 2014-06-25 ENCOUNTER — Other Ambulatory Visit: Payer: Self-pay | Admitting: Physician Assistant

## 2014-07-17 ENCOUNTER — Ambulatory Visit: Payer: Self-pay | Admitting: Physician Assistant

## 2014-07-30 ENCOUNTER — Other Ambulatory Visit: Payer: Self-pay | Admitting: Physician Assistant

## 2014-07-31 ENCOUNTER — Other Ambulatory Visit: Payer: Self-pay

## 2014-08-01 ENCOUNTER — Other Ambulatory Visit: Payer: 59

## 2014-08-02 LAB — HEPATIC FUNCTION PANEL
ALBUMIN: 4.2 g/dL (ref 3.5–5.2)
ALK PHOS: 95 U/L (ref 39–117)
ALT: 17 U/L (ref 0–35)
AST: 12 U/L (ref 0–37)
BILIRUBIN TOTAL: 0.4 mg/dL (ref 0.2–1.2)
Bilirubin, Direct: 0.1 mg/dL (ref 0.0–0.3)
Indirect Bilirubin: 0.3 mg/dL (ref 0.2–1.2)
TOTAL PROTEIN: 6.8 g/dL (ref 6.0–8.3)

## 2014-08-05 ENCOUNTER — Other Ambulatory Visit: Payer: Self-pay | Admitting: Internal Medicine

## 2014-08-27 ENCOUNTER — Other Ambulatory Visit: Payer: Self-pay | Admitting: Emergency Medicine

## 2014-09-24 ENCOUNTER — Encounter: Payer: Self-pay | Admitting: Physician Assistant

## 2014-09-24 ENCOUNTER — Other Ambulatory Visit: Payer: Self-pay | Admitting: Internal Medicine

## 2014-09-24 ENCOUNTER — Ambulatory Visit (INDEPENDENT_AMBULATORY_CARE_PROVIDER_SITE_OTHER): Payer: 59 | Admitting: Physician Assistant

## 2014-09-24 VITALS — BP 128/80 | HR 64 | Temp 98.1°F | Resp 16 | Ht 60.5 in | Wt 251.6 lb

## 2014-09-24 DIAGNOSIS — I10 Essential (primary) hypertension: Secondary | ICD-10-CM

## 2014-09-24 DIAGNOSIS — R7309 Other abnormal glucose: Secondary | ICD-10-CM

## 2014-09-24 DIAGNOSIS — Z79899 Other long term (current) drug therapy: Secondary | ICD-10-CM

## 2014-09-24 DIAGNOSIS — R7303 Prediabetes: Secondary | ICD-10-CM

## 2014-09-24 DIAGNOSIS — E559 Vitamin D deficiency, unspecified: Secondary | ICD-10-CM

## 2014-09-24 DIAGNOSIS — E7800 Pure hypercholesterolemia, unspecified: Secondary | ICD-10-CM

## 2014-09-24 DIAGNOSIS — E039 Hypothyroidism, unspecified: Secondary | ICD-10-CM

## 2014-09-24 LAB — HEMOGLOBIN A1C
Hgb A1c MFr Bld: 5.9 % — ABNORMAL HIGH (ref <5.7)
Mean Plasma Glucose: 123 mg/dL — ABNORMAL HIGH (ref <117)

## 2014-09-24 LAB — CBC WITH DIFFERENTIAL/PLATELET
BASOS ABS: 0.1 10*3/uL (ref 0.0–0.1)
Basophils Relative: 1 % (ref 0–1)
EOS PCT: 5 % (ref 0–5)
Eosinophils Absolute: 0.7 10*3/uL (ref 0.0–0.7)
HEMATOCRIT: 40.4 % (ref 36.0–46.0)
Hemoglobin: 13.7 g/dL (ref 12.0–15.0)
LYMPHS ABS: 3.5 10*3/uL (ref 0.7–4.0)
LYMPHS PCT: 26 % (ref 12–46)
MCH: 29 pg (ref 26.0–34.0)
MCHC: 33.9 g/dL (ref 30.0–36.0)
MCV: 85.4 fL (ref 78.0–100.0)
MONO ABS: 0.8 10*3/uL (ref 0.1–1.0)
MPV: 10 fL (ref 8.6–12.4)
Monocytes Relative: 6 % (ref 3–12)
NEUTROS ABS: 8.3 10*3/uL — AB (ref 1.7–7.7)
Neutrophils Relative %: 62 % (ref 43–77)
PLATELETS: 295 10*3/uL (ref 150–400)
RBC: 4.73 MIL/uL (ref 3.87–5.11)
RDW: 14.1 % (ref 11.5–15.5)
WBC: 13.4 10*3/uL — AB (ref 4.0–10.5)

## 2014-09-24 NOTE — Progress Notes (Signed)
Assessment and Plan:  1. Hypertension -Continue medication, monitor blood pressure at home. Continue DASH diet.  Reminder to go to the ER if any CP, SOB, nausea, dizziness, severe HA, changes vision/speech, left arm numbness and tingling and jaw pain.  2. Cholesterol -Continue diet and exercise. Check cholesterol.   3. Prediabetes  -Continue diet and exercise. Check A1C  4. Vitamin D Def - check level and continue medications.   5. Morbid Obesity with co morbidities-  long discussion about weight loss, diet, and exercise  6. Hypothyroidism -check TSH level, continue medications the same, reminded to take on an empty stomach 30-5mins before food.   Continue diet and meds as discussed. Further disposition pending results of labs.Over 30 minutes of exam, counseling, chart review, and critical decision making was performed  HPI 54 y.o. female  presents for 3 month follow up on hypertension, cholesterol, prediabetes, and vitamin D deficiency.   Her blood pressure has been controlled at home, today their BP is BP: 128/80 mmHg  She does not workout. She denies chest pain, shortness of breath, dizziness.  She is on cholesterol medication, lipitor 20 1/2 pill daily and denies myalgias. Her cholesterol is at goal. The cholesterol last visit was:   Lab Results  Component Value Date   CHOL 137 06/19/2014   HDL 47 06/19/2014   LDLCALC 61 06/19/2014   TRIG 144 06/19/2014   CHOLHDL 2.9 06/19/2014   She has been working on diet and exercise for prediabetes, and denies paresthesia of the feet, polydipsia, polyuria and visual disturbances.  Last A1C in the office was:  Lab Results  Component Value Date   HGBA1C 6.2* 06/19/2014  Patient is on Vitamin D supplement, 5000 IU dialy.    Lab Results  Component Value Date   VD25OH 26 06/19/2014   She is on thyroid medication. Her medication was not changed last visit, follows with Dr. Sharl Ma, but will start to monitor here.     Lab Results   Component Value Date   TSH 0.556 06/19/2014  She has hot flashes but states it is better with lexapro and  low dose estrogen and bASA.  BMI is Body mass index is 48.31 kg/(m^2)., she is working on diet and exercise. Wt Readings from Last 3 Encounters:  09/24/14 251 lb 9.6 oz (114.125 kg)  06/19/14 252 lb (114.306 kg)  03/20/14 242 lb (109.77 kg)     Current Medications:  Current Outpatient Prescriptions on File Prior to Visit  Medication Sig Dispense Refill  . albuterol (PROVENTIL HFA;VENTOLIN HFA) 108 (90 BASE) MCG/ACT inhaler Inhale 2 puffs into the lungs every 6 (six) hours as needed for wheezing or shortness of breath. 18 g 2  . aMILoride (MIDAMOR) 5 MG tablet TAKE 1 TABLET BY MOUTH EVERY MORNING. 90 tablet 1  . Ascorbic Acid (VITAMIN C) 1000 MG tablet Take 1,000 mg by mouth daily.    Marland Kitchen aspirin 325 MG EC tablet Take 325 mg by mouth every morning.     Marland Kitchen atorvastatin (LIPITOR) 20 MG tablet TAKE 1 TABLET BY MOUTH ONCE DAILY 90 tablet PRN  . candesartan (ATACAND) 32 MG tablet TAKE 1/2 TO 1 TABLET BY MOUTH EVERY NIGHT AS DIRECTED FOR BLOOD PRESSURE 90 tablet 0  . cetirizine (ZYRTEC) 10 MG tablet Take 10 mg by mouth daily.    . Cholecalciferol (VITAMIN D-3) 5000 UNITS TABS Take 5,000 Units by mouth daily.    . cyanocobalamin 2000 MCG tablet Take 2,000 mcg by mouth daily.    Marland Kitchen  escitalopram (LEXAPRO) 20 MG tablet TAKE 1 TABLET BY MOUTH ONCE DAILY 30 tablet 2  . esomeprazole (NEXIUM) 40 MG capsule TAKE 1 CAPSULE BY MOUTH DAILY. 90 capsule 3  . estradiol (ESTRACE) 1 MG tablet 1/2-1 pill daily for vasomotor symptoms 30 tablet 3  . famciclovir (FAMVIR) 500 MG tablet Take 500 mg by mouth 3 (three) times daily. Take AD for fever blister    . fluconazole (DIFLUCAN) 150 MG tablet Take 150 mg by mouth once a week. PRN yeast    . fluticasone (FLONASE) 50 MCG/ACT nasal spray Place 1 spray into both nostrils daily. 16 g 2  . Glucosamine-Chondroitin (GLUCOSAMINE CHONDR COMPLEX PO) Take 1,500 mg by  mouth. 1500mg /1200mg   Actual dose    . levothyroxine (SYNTHROID, LEVOTHROID) 137 MCG tablet Take 137 mcg by mouth daily before breakfast.    . metFORMIN (GLUCOPHAGE-XR) 500 MG 24 hr tablet TAKE 2 TABLETS BY MOUTH TWICE DAILY 360 tablet 3  . propranolol (INDERAL) 80 MG tablet TAKE 1 TABLET BY MOUTH TWICE DAILY 180 tablet PRN  . traMADol (ULTRAM) 50 MG tablet Take 1 tablet (50 mg total) by mouth every 6 (six) hours as needed. 60 tablet 3   No current facility-administered medications on file prior to visit.   Medical History:  Past Medical History  Diagnosis Date  . Multinodular goiter   . High cholesterol   . Hypothyroidism   . GERD (gastroesophageal reflux disease)   . Hypertension    Allergies:  Allergies  Allergen Reactions  . Avelox [Moxifloxacin Hcl In Nacl] Hives    Thrush, throat might have closed   . Hydrochlorothiazide Itching    On hands and feet  . Other Itching    All "Cillins", hives alao  . Penicillins     Hives  . Prednisone     Chest pains  . Zostavax [Zoster Vaccine Live]   . Codeine Hives, Itching and Rash  . Levothyroxine Rash    Only to generic. Not to brand synthroid.   . Sulfa Antibiotics Itching and Rash    Hands and feet itch and turn red    Review of Systems:  Review of Systems  Constitutional: Negative.        + hot flashes  HENT: Negative.   Eyes: Negative.   Respiratory: Negative.   Cardiovascular: Positive for leg swelling. Negative for chest pain, palpitations, orthopnea, claudication and PND.  Gastrointestinal: Negative.   Genitourinary: Negative.   Musculoskeletal: Negative.   Skin: Negative.   Neurological: Negative.   Endo/Heme/Allergies: Negative.   Psychiatric/Behavioral: Negative.     Family history- Review and unchanged Social history- Review and unchanged Physical Exam: BP 128/80 mmHg  Pulse 64  Temp(Src) 98.1 F (36.7 C)  Resp 16  Ht 5' 0.5" (1.537 m)  Wt 251 lb 9.6 oz (114.125 kg)  BMI 48.31 kg/m2 Wt Readings  from Last 3 Encounters:  09/24/14 251 lb 9.6 oz (114.125 kg)  06/19/14 252 lb (114.306 kg)  03/20/14 242 lb (109.77 kg)   General Appearance: Well nourished, in no apparent distress. Eyes: PERRLA, EOMs, conjunctiva no swelling or erythema Sinuses: No Frontal/maxillary tenderness ENT/Mouth: Ext aud canals clear, TMs without erythema, bulging. No erythema, swelling, or exudate on post pharynx.  Tonsils not swollen or erythematous. Hearing normal.  Neck: Supple, thyroid normal.  Respiratory: Respiratory effort normal, BS equal bilaterally without rales, rhonchi, wheezing or stridor.  Cardio: RRR with no MRGs. Brisk peripheral pulses with 2+ edema.  Abdomen: Soft, + BS, obese,  Non  tender, no guarding, rebound, hernias, masses. Lymphatics: Non tender without lymphadenopathy.  Musculoskeletal: Full ROM, 5/5 strength Skin: Warm, dry without rashes, lesions, ecchymosis.  Neuro: Cranial nerves intact. Normal muscle tone, no cerebellar symptoms. Psych: Awake and oriented X 3, normal affect, Insight and Judgment appropriate.    Quentin Mulling, PA-C 8:45 AM [facility]

## 2014-09-24 NOTE — Patient Instructions (Signed)
HOME CARE INSTRUCTIONS   Do not stand or sit in one position for long periods of time. Do not sit with your legs crossed. Rest with your legs raised during the day.  Your legs have to be higher than your heart so that gravity will force the valves to open, so please really elevate your legs.   Wear elastic stockings or support hose. Do not wear other tight, encircling garments around the legs, pelvis, or waist.  [employer]  has a wide variety of well priced compression stockings. [address] [place], [place] [address] [phone]  Walk as much as possible to increase blood flow.  Raise the foot of your bed at night with 2-inch blocks. SEEK MEDICAL CARE IF:   The skin around your ankle starts to break down.  You have pain, redness, tenderness, or hard swelling developing in your leg over a vein.  You are uncomfortable due to leg pain. Document Released: 11/18/2004 Document Revised: 05/03/2011 Document Reviewed: 04/06/2010 [facility] Patient Information 2014 [place], [place].  We want weight loss that will last so you should lose 1-2 pounds a week.  THAT IS IT! Please pick THREE things a month to change. Once it is a habit check off the item. Then pick another three items off the list to become habits.  If you are already doing a habit on the list GREAT!  Cross that item off! o Don't drink your calories. Ie, alcohol, soda, fruit juice, and sweet tea.  o Drink more water. Drink a glass when you feel hungry or before each meal.  o Eat breakfast - Complex carb and protein (likeDannon light and fit yogurt, oatmeal, fruit, eggs, [place] bacon). o Measure your cereal.  Eat no more than one cup a day. (ie [place]) o Eat an apple a day. o Add a vegetable a day. o Try a new vegetable a month. o Use Pam! Stop using oil or butter to cook. o Don't finish your plate or use smaller plates. o Share your dessert. o Eat sugar free Jello for dessert or frozen grapes. o Don't eat 2-3 hours before  bed. o Switch to whole wheat bread, pasta, and brown rice. o Make healthier choices when you eat out. No fries! o Pick baked chicken, NOT fried. o Don't forget to SLOW DOWN when you eat. It is not going anywhere.  o Take the stairs. o Park far away in the parking lot o [employer] (or weights) for 10 minutes while watching TV. o Walk at work for 10 minutes during break. o Walk outside 1 time a week with your friend, kids, dog, or significant other. o Start a walking group at church. o Walk the mall as much as you can tolerate.  o Keep a food diary. o Weigh yourself daily. o Walk for 15 minutes 3 days per week. o Cook at home more often and eat out less.  If life happens and you go back to old habits, it is okay.  Just start over. You can do it!   If you experience chest pain, get short of breath, or tired during the exercise, please stop immediately and inform your doctor.   Before you even begin to attack a weight-loss plan, it pays to remember this: You are not fat. You have fat. Losing weight isn't about blame or shame; it's simply another achievement to accomplish. Dieting is like any other skill-you have to buckle down and work at it. As long as you  act in a smart, reasonable way, you'll ultimately get where you want to be. Here are some weight loss pearls for you.  1. It's Not a Diet. It's a Lifestyle Thinking of a diet as something you're on and suffering through only for the short term doesn't work. To shed weight and keep it off, you need to make permanent changes to the way you eat. It's OK to indulge occasionally, of course, but if you cut calories temporarily and then revert to your old way of eating, you'll gain back the weight quicker than you can say yo-yo. Use it to lose it. Research shows that one of the best predictors of long-term weight loss is how many pounds you drop in the first month. For that reason, nutritionists often suggest being stricter for the first two weeks  of your new eating strategy to build momentum. Cut out added sugar and alcohol and avoid unrefined carbs. After that, figure out how you can reincorporate them in a way that's healthy and maintainable.  2. There's a Right Way to Exercise Working out burns calories and fat and boosts your metabolism by building muscle. But those trying to lose weight are notorious for overestimating the number of calories they burn and underestimating the amount they take in. Unfortunately, your system is biologically programmed to hold on to extra pounds and that means when you start exercising, your body senses the deficit and ramps up its hunger signals. If you're not diligent, you'll eat everything you burn and then some. Use it to lose it. Cardio gets all the exercise glory, but strength and interval training are the real heroes. They help you build lean muscle, which in turn increases your metabolism and calorie-burning ability 3. Don't Overreact to Mild Hunger Some people have a hard time losing weight because of hunger anxiety. To them, being hungry is bad-something to be avoided at all costs-so they carry snacks with them and eat when they don't need to. Others eat because they're stressed out or bored. While you never want to get to the point of being ravenous (that's when bingeing is likely to happen), a hunger pang, a craving, or the fact that it's 3:00 p.m. should not send you racing for the vending machine or obsessing about the energy bar in your purse. Ideally, you should put off eating until your stomach is growling and it's difficult to concentrate.  Use it to lose it. When you feel the urge to eat, use the HALT method. Ask yourself, Am I really hungry? Or am I angry or anxious, lonely or bored, or tired? If you're still not certain, try the apple test. If you're truly hungry, an apple should seem delicious; if it doesn't, something else is going on. Or you can try drinking water and making yourself busy, if  you are still hungry try a healthy snack.  4. Not All Calories Are Created Equal The mechanics of weight loss are pretty simple: Take in fewer calories than you use for energy. But the kind of food you eat makes all the difference. Processed food that's high in saturated fat and refined starch or sugar can cause inflammation that disrupts the hormone signals that tell your brain you're full. The result: You eat a lot more.  Use it to lose it. Clean up your diet. Swap in whole, unprocessed foods, including vegetables, lean protein, and healthy fats that will fill you up and give you the biggest nutritional bang for your calorie buck. In a  few weeks, as your brain starts receiving regular hunger and fullness signals once again, you'll notice that you feel less hungry overall and naturally start cutting back on the amount you eat.  5. Protein, Produce, and Plant-Based Fats Are Your Weight-Loss Trinity Here's why eating the three Ps regularly will help you drop pounds. Protein fills you up. You need it to build lean muscle, which keeps your metabolism humming so that you can torch more fat. People in a weight-loss program who ate double the recommended daily allowance for protein (about 110 grams for a 150-pound woman) lost 70 percent of their weight from fat, while people who ate the RDA lost only about 40 percent, one study found. Produce is packed with filling fiber. "It's very difficult to consume too many calories if you're eating a lot of vegetables. Example: Three cups of broccoli is a lot of food, yet only 93 calories. (Fruit is another story. It can be easy to overeat and can contain a lot of calories from sugar, so be sure to monitor your intake.) Plant-based fats like olive oil and those in avocados and nuts are healthy and extra satiating.  Use it to lose it. Aim to incorporate each of the three Ps into every meal and snack. People who eat protein throughout the day are able to keep weight off,  according to a study in the American Journal of Clinical Nutrition. In addition to meat, poultry and seafood, good sources are beans, lentils, eggs, tofu, and yogurt. As for fat, keep portion sizes in check by measuring out salad dressing, oil, and nut butters (shoot for one to two tablespoons). Finally, eat veggies or a little fruit at every meal. People who did that consumed 308 fewer calories but didn't feel any hungrier than when they didn't eat more produce.  7. How You Eat Is As Important As What You Eat In order for your brain to register that you're full, you need to focus on what you're eating. Sit down whenever you eat, preferably at a table. Turn off the TV or computer, put down your phone, and look at your food. Smell it. Chew slowly, and don't put another bite on your fork until you swallow. When women ate lunch this attentively, they consumed 30 percent less when snacking later than those who listened to an audiobook at lunchtime, according to a study in the [place] Journal of Nutrition. 8. Weighing Yourself Really Works The scale provides the best evidence about whether your efforts are paying off. Seeing the numbers tick up or down or stagnate is motivation to keep going-or to rethink your approach. A 2015 study at [facility] found that daily weigh-ins helped people lose more weight, keep it off, and maintain that loss, even after two years. Use it to lose it. Step on the scale at the same time every day for the best results. If your weight shoots up several pounds from one weigh-in to the next, don't freak out. Eating a lot of salt the night before or having your period is the likely culprit. The number should return to normal in a day or two. It's a steady climb that you need to do something about. 9. Too Much Stress and Too Little Sleep Are Your Enemies When you're tired and frazzled, your body cranks up the production of cortisol, the stress hormone that can cause carb cravings.  Not getting enough sleep also boosts your levels of ghrelin, a hormone associated with hunger, while suppressing leptin, a  hormone that signals fullness and satiety. People on a diet who slept only five and a half hours a night for two weeks lost 55 percent less fat and were hungrier than those who slept eight and a half hours, according to a study in the [place] [employer] Journal. Use it to lose it. Prioritize sleep, aiming for seven hours or more a night, which research shows helps lower stress. And make sure you're getting quality zzz's. If a snoring spouse or a fidgety cat wakes you up frequently throughout the night, you may end up getting the equivalent of just four hours of sleep, according to a study from [facility]. Keep pets out of the bedroom, and use a white-noise app to drown out snoring. 10. You Will Hit a plateau-And You Can Bust Through It As you slim down, your body releases much less leptin, the fullness hormone.  If you're not strength training, start right now. Building muscle can raise your metabolism to help you overcome a plateau. To keep your body challenged and burning calories, incorporate new moves and more intense intervals into your workouts or add another sweat session to your weekly routine. Alternatively, cut an extra 100 calories or so a day from your diet. Now that you've lost weight, your body simply doesn't need as much fuel.

## 2014-09-25 LAB — BASIC METABOLIC PANEL WITH GFR
BUN: 14 mg/dL (ref 7–25)
CALCIUM: 9.9 mg/dL (ref 8.6–10.4)
CHLORIDE: 102 mmol/L (ref 98–110)
CO2: 30 mmol/L (ref 20–31)
Creat: 0.7 mg/dL (ref 0.50–1.05)
GFR, Est African American: >89 mL/min (ref >=60)
GFR, Est Non African American: >89 mL/min (ref >=60)
Glucose, Bld: 80 mg/dL (ref 65–99)
POTASSIUM: 4.7 mmol/L (ref 3.5–5.3)
Sodium: 139 mmol/L (ref 135–146)

## 2014-09-25 LAB — HEPATIC FUNCTION PANEL
ALK PHOS: 84 U/L (ref 33–130)
ALT: 22 U/L (ref 6–29)
AST: 17 U/L (ref 10–35)
Albumin: 3.9 g/dL (ref 3.6–5.1)
Bilirubin, Direct: 0.1 mg/dL (ref <=0.2)
Indirect Bilirubin: 0.2 mg/dL (ref 0.2–1.2)
Total Bilirubin: 0.3 mg/dL (ref 0.2–1.2)
Total Protein: 6.6 g/dL (ref 6.1–8.1)

## 2014-09-25 LAB — LIPID PANEL
CHOL/HDL RATIO: 2.9 ratio (ref <=5.0)
CHOLESTEROL: 146 mg/dL (ref 125–200)
HDL: 50 mg/dL (ref >=46)
LDL CALC: 65 mg/dL (ref <130)
Triglycerides: 155 mg/dL — ABNORMAL HIGH (ref <150)
VLDL: 31 mg/dL — ABNORMAL HIGH (ref <30)

## 2014-09-25 LAB — INSULIN, FASTING: Insulin fasting, serum: 17.6 u[IU]/mL (ref 2.0–19.6)

## 2014-09-25 LAB — MAGNESIUM: Magnesium: 1.7 mg/dL (ref 1.5–2.5)

## 2014-09-25 LAB — VITAMIN D 25 HYDROXY (VIT D DEFICIENCY, FRACTURES): VIT D 25 HYDROXY: 62 ng/mL (ref 30–100)

## 2014-09-25 LAB — TSH: TSH: 0.911 u[IU]/mL (ref 0.350–4.500)

## 2014-09-26 ENCOUNTER — Other Ambulatory Visit: Payer: Self-pay | Admitting: Emergency Medicine

## 2014-11-19 ENCOUNTER — Other Ambulatory Visit: Payer: Self-pay | Admitting: Emergency Medicine

## 2014-11-19 ENCOUNTER — Other Ambulatory Visit: Payer: Self-pay | Admitting: Internal Medicine

## 2014-12-24 ENCOUNTER — Encounter: Payer: Self-pay | Admitting: Physician Assistant

## 2014-12-24 ENCOUNTER — Other Ambulatory Visit: Payer: Self-pay | Admitting: Internal Medicine

## 2015-01-10 ENCOUNTER — Encounter: Payer: Self-pay | Admitting: Physician Assistant

## 2015-01-10 ENCOUNTER — Ambulatory Visit (INDEPENDENT_AMBULATORY_CARE_PROVIDER_SITE_OTHER): Payer: 59 | Admitting: Physician Assistant

## 2015-01-10 VITALS — BP 128/80 | HR 67 | Temp 97.3°F | Resp 16 | Ht 60.5 in | Wt 246.0 lb

## 2015-01-10 DIAGNOSIS — R6889 Other general symptoms and signs: Secondary | ICD-10-CM | POA: Diagnosis not present

## 2015-01-10 DIAGNOSIS — M79672 Pain in left foot: Secondary | ICD-10-CM

## 2015-01-10 DIAGNOSIS — N951 Menopausal and female climacteric states: Secondary | ICD-10-CM

## 2015-01-10 DIAGNOSIS — E559 Vitamin D deficiency, unspecified: Secondary | ICD-10-CM | POA: Diagnosis not present

## 2015-01-10 DIAGNOSIS — M79605 Pain in left leg: Secondary | ICD-10-CM

## 2015-01-10 DIAGNOSIS — M79604 Pain in right leg: Secondary | ICD-10-CM

## 2015-01-10 DIAGNOSIS — R7303 Prediabetes: Secondary | ICD-10-CM

## 2015-01-10 DIAGNOSIS — I10 Essential (primary) hypertension: Secondary | ICD-10-CM | POA: Diagnosis not present

## 2015-01-10 DIAGNOSIS — K2100 Gastro-esophageal reflux disease with esophagitis, without bleeding: Secondary | ICD-10-CM

## 2015-01-10 DIAGNOSIS — E7800 Pure hypercholesterolemia, unspecified: Secondary | ICD-10-CM | POA: Diagnosis not present

## 2015-01-10 DIAGNOSIS — Z1159 Encounter for screening for other viral diseases: Secondary | ICD-10-CM

## 2015-01-10 DIAGNOSIS — Z0001 Encounter for general adult medical examination with abnormal findings: Secondary | ICD-10-CM

## 2015-01-10 DIAGNOSIS — B372 Candidiasis of skin and nail: Secondary | ICD-10-CM

## 2015-01-10 DIAGNOSIS — Z79899 Other long term (current) drug therapy: Secondary | ICD-10-CM | POA: Diagnosis not present

## 2015-01-10 DIAGNOSIS — K828 Other specified diseases of gallbladder: Secondary | ICD-10-CM

## 2015-01-10 DIAGNOSIS — E039 Hypothyroidism, unspecified: Secondary | ICD-10-CM | POA: Diagnosis not present

## 2015-01-10 DIAGNOSIS — R350 Frequency of micturition: Secondary | ICD-10-CM

## 2015-01-10 DIAGNOSIS — M79671 Pain in right foot: Secondary | ICD-10-CM

## 2015-01-10 LAB — CBC WITH DIFFERENTIAL/PLATELET
BASOS ABS: 0.1 10*3/uL (ref 0.0–0.1)
BASOS PCT: 1 % (ref 0–1)
EOS ABS: 0.4 10*3/uL (ref 0.0–0.7)
Eosinophils Relative: 4 % (ref 0–5)
HCT: 40.3 % (ref 36.0–46.0)
Hemoglobin: 13.6 g/dL (ref 12.0–15.0)
LYMPHS ABS: 2.7 10*3/uL (ref 0.7–4.0)
Lymphocytes Relative: 28 % (ref 12–46)
MCH: 29.2 pg (ref 26.0–34.0)
MCHC: 33.7 g/dL (ref 30.0–36.0)
MCV: 86.5 fL (ref 78.0–100.0)
MPV: 10.2 fL (ref 8.6–12.4)
Monocytes Absolute: 0.5 10*3/uL (ref 0.1–1.0)
Monocytes Relative: 5 % (ref 3–12)
NEUTROS PCT: 62 % (ref 43–77)
Neutro Abs: 5.9 10*3/uL (ref 1.7–7.7)
PLATELETS: 295 10*3/uL (ref 150–400)
RBC: 4.66 MIL/uL (ref 3.87–5.11)
RDW: 13.6 % (ref 11.5–15.5)
WBC: 9.5 10*3/uL (ref 4.0–10.5)

## 2015-01-10 LAB — HEMOGLOBIN A1C
HEMOGLOBIN A1C: 5.9 % — AB (ref <5.7)
Mean Plasma Glucose: 123 mg/dL — ABNORMAL HIGH (ref <117)

## 2015-01-10 MED ORDER — NYSTATIN 100000 UNIT/GM EX POWD: CUTANEOUS | Status: DC

## 2015-01-10 MED ORDER — NYSTATIN 100000 UNIT/GM EX CREA: 1.0000 "application " | TOPICAL_CREAM | Freq: Two times a day (BID) | CUTANEOUS | Status: DC

## 2015-01-10 MED ORDER — NITROFURANTOIN MONOHYD MACRO 100 MG PO CAPS: 100.0000 mg | ORAL_CAPSULE | Freq: Two times a day (BID) | ORAL | Status: DC

## 2015-01-10 MED ORDER — FLUTICASONE PROPIONATE 50 MCG/ACT NA SUSP: 1.0000 | Freq: Every day | NASAL | Status: DC

## 2015-01-10 NOTE — Patient Instructions (Signed)
We want weight loss that will last so you should lose 1-2 pounds a week.  THAT IS IT! Please pick THREE things a month to change. Once it is a habit check off the item. Then pick another three items off the list to become habits.  If you are already doing a habit on the list GREAT!  Cross that item off! o Don't drink your calories. Ie, alcohol, soda, fruit juice, and sweet tea.  o Drink more water. Drink a glass when you feel hungry or before each meal.  o Eat breakfast - Complex carb and protein (likeDannon light and fit yogurt, oatmeal, fruit, eggs, [place] bacon). o Measure your cereal.  Eat no more than one cup a day. (ie [place]) o Eat an apple a day. o Add a vegetable a day. o Try a new vegetable a month. o Use Pam! Stop using oil or butter to cook. o Don't finish your plate or use smaller plates. o Share your dessert. o Eat sugar free Jello for dessert or frozen grapes. o Don't eat 2-3 hours before bed. o Switch to whole wheat bread, pasta, and brown rice. o Make healthier choices when you eat out. No fries! o Pick baked chicken, NOT fried. o Don't forget to SLOW DOWN when you eat. It is not going anywhere.  o Take the stairs. o Park far away in the parking lot o [employer] (or weights) for 10 minutes while watching TV. o Walk at work for 10 minutes during break. o Walk outside 1 time a week with your friend, kids, dog, or significant other. o Start a walking group at [place] the mall as much as you can tolerate.  o Keep a food diary. o Weigh yourself daily. o Walk for 15 minutes 3 days per week. o Cook at home more often and eat out less.  If life happens and you go back to old habits, it is okay.  Just start over. You can do it!   If you experience chest pain, get short of breath, or tired during the exercise, please stop immediately and inform your doctor.   Before you even begin to attack a weight-loss plan, it pays to remember this: You are not fat. You have fat.  Losing weight isn't about blame or shame; it's simply another achievement to accomplish. Dieting is like any other skill-you have to buckle down and work at it. As long as you act in a smart, reasonable way, you'll ultimately get where you want to be. Here are some weight loss pearls for you.  1. It's Not a Diet. It's a Lifestyle Thinking of a diet as something you're on and suffering through only for the short term doesn't work. To shed weight and keep it off, you need to make permanent changes to the way you eat. It's OK to indulge occasionally, of course, but if you cut calories temporarily and then revert to your old way of eating, you'll gain back the weight quicker than you can say yo-yo. Use it to lose it. Research shows that one of the best predictors of long-term weight loss is how many pounds you drop in the first month. For that reason, nutritionists often suggest being stricter for the first two weeks of your new eating strategy to build momentum. Cut out added sugar and alcohol and avoid unrefined carbs. After that, figure out how you can reincorporate them in a way that's healthy and maintainable.  2. There's a Right  Way to Exercise Working out burns calories and fat and boosts your metabolism by building muscle. But those trying to lose weight are notorious for overestimating the number of calories they burn and underestimating the amount they take in. Unfortunately, your system is biologically programmed to hold on to extra pounds and that means when you start exercising, your body senses the deficit and ramps up its hunger signals. If you're not diligent, you'll eat everything you burn and then some. Use it to lose it. Cardio gets all the exercise glory, but strength and interval training are the real heroes. They help you build lean muscle, which in turn increases your metabolism and calorie-burning ability 3. Don't Overreact to Mild Hunger Some people have a hard time losing weight because  of hunger anxiety. To them, being hungry is bad-something to be avoided at all costs-so they carry snacks with them and eat when they don't need to. Others eat because they're stressed out or bored. While you never want to get to the point of being ravenous (that's when bingeing is likely to happen), a hunger pang, a craving, or the fact that it's 3:00 p.m. should not send you racing for the vending machine or obsessing about the energy bar in your purse. Ideally, you should put off eating until your stomach is growling and it's difficult to concentrate.  Use it to lose it. When you feel the urge to eat, use the HALT method. Ask yourself, Am I really hungry? Or am I angry or anxious, lonely or bored, or tired? If you're still not certain, try the apple test. If you're truly hungry, an apple should seem delicious; if it doesn't, something else is going on. Or you can try drinking water and making yourself busy, if you are still hungry try a healthy snack.  4. Not All Calories Are Created Equal The mechanics of weight loss are pretty simple: Take in fewer calories than you use for energy. But the kind of food you eat makes all the difference. Processed food that's high in saturated fat and refined starch or sugar can cause inflammation that disrupts the hormone signals that tell your brain you're full. The result: You eat a lot more.  Use it to lose it. Clean up your diet. Swap in whole, unprocessed foods, including vegetables, lean protein, and healthy fats that will fill you up and give you the biggest nutritional bang for your calorie buck. In a few weeks, as your brain starts receiving regular hunger and fullness signals once again, you'll notice that you feel less hungry overall and naturally start cutting back on the amount you eat.  5. Protein, Produce, and Plant-Based Fats Are Your Weight-Loss Trinity Here's why eating the three Ps regularly will help you drop pounds. Protein fills you up. You need it  to build lean muscle, which keeps your metabolism humming so that you can torch more fat. People in a weight-loss program who ate double the recommended daily allowance for protein (about 110 grams for a 150-pound woman) lost 70 percent of their weight from fat, while people who ate the RDA lost only about 40 percent, one study found. Produce is packed with filling fiber. "It's very difficult to consume too many calories if you're eating a lot of vegetables. Example: Three cups of broccoli is a lot of food, yet only 93 calories. (Fruit is another story. It can be easy to overeat and can contain a lot of calories from sugar, so be sure to  monitor your intake.) Plant-based fats like olive oil and those in avocados and nuts are healthy and extra satiating.  Use it to lose it. Aim to incorporate each of the three Ps into every meal and snack. People who eat protein throughout the day are able to keep weight off, according to a study in the [employer]. In addition to meat, poultry and seafood, good sources are beans, lentils, eggs, tofu, and yogurt. As for fat, keep portion sizes in check by measuring out salad dressing, oil, and nut butters (shoot for one to two tablespoons). Finally, eat veggies or a little fruit at every meal. People who did that consumed 308 fewer calories but didn't feel any hungrier than when they didn't eat more produce.  7. How You Eat Is As Important As What You Eat In order for your brain to register that you're full, you need to focus on what you're eating. Sit down whenever you eat, preferably at a table. Turn off the TV or computer, put down your phone, and look at your food. Smell it. Chew slowly, and don't put another bite on your fork until you swallow. When women ate lunch this attentively, they consumed 30 percent less when snacking later than those who listened to an audiobook at lunchtime, according to a study in the [place] of Nutrition. 8.  Weighing Yourself Really Works The scale provides the best evidence about whether your efforts are paying off. Seeing the numbers tick up or down or stagnate is motivation to keep going-or to rethink your approach. A 2015 study at [facility] found that daily weigh-ins helped people lose more weight, keep it off, and maintain that loss, even after two years. Use it to lose it. Step on the scale at the same time every day for the best results. If your weight shoots up several pounds from one weigh-in to the next, don't freak out. Eating a lot of salt the night before or having your period is the likely culprit. The number should return to normal in a day or two. It's a steady climb that you need to do something about. 9. Too Much Stress and Too Little Sleep Are Your Enemies When you're tired and frazzled, your body cranks up the production of cortisol, the stress hormone that can cause carb cravings. Not getting enough sleep also boosts your levels of ghrelin, a hormone associated with hunger, while suppressing leptin, a hormone that signals fullness and satiety. People on a diet who slept only five and a half hours a night for two weeks lost 55 percent less fat and were hungrier than those who slept eight and a half hours, according to a study in the [place]. Use it to lose it. Prioritize sleep, aiming for seven hours or more a night, which research shows helps lower stress. And make sure you're getting quality zzz's. If a snoring spouse or a fidgety cat wakes you up frequently throughout the night, you may end up getting the equivalent of just four hours of sleep, according to a study from [facility]. Keep pets out of the bedroom, and use a white-noise app to drown out snoring. 10. You Will Hit a plateau-And You Can Bust Through It As you slim down, your body releases much less leptin, the fullness hormone.  If you're not strength training, start right now.  Building muscle can raise your metabolism to help you overcome a plateau. To keep your body challenged  and burning calories, incorporate new moves and more intense intervals into your workouts or add another sweat session to your weekly routine. Alternatively, cut an extra 100 calories or so a day from your diet. Now that you've lost weight, your body simply doesn't need as much fuel.   Preventive Care for Adults  A healthy lifestyle and preventive care can promote health and wellness. Preventive health guidelines for women include the following key practices.  A routine yearly physical is a good way to check with your health care provider about your health and preventive screening. It is a chance to share any concerns and updates on your health and to receive a thorough exam.  Visit your dentist for a routine exam and preventive care every 6 months. Brush your teeth twice a day and floss once a day. Good oral hygiene prevents tooth decay and gum disease.  The frequency of eye exams is based on your age, health, family medical history, use of contact lenses, and other factors. Follow your health care provider's recommendations for frequency of eye exams.  Eat a healthy diet. Foods like vegetables, fruits, whole grains, low-fat dairy products, and lean protein foods contain the nutrients you need without too many calories. Decrease your intake of foods high in solid fats, added sugars, and salt. Eat the right amount of calories for you.Get information about a proper diet from your health care provider, if necessary.  Regular physical exercise is one of the most important things you can do for your health. Most adults should get at least 150 minutes of moderate-intensity exercise (any activity that increases your heart rate and causes you to sweat) each week. In addition, most adults need muscle-strengthening exercises on 2 or more days a week.  Maintain a healthy weight. The body mass index (BMI) is  a screening tool to identify possible weight problems. It provides an estimate of body fat based on height and weight. Your health care provider can find your BMI and can help you achieve or maintain a healthy weight.For adults 20 years and older:  A BMI below 18.5 is considered underweight.  A BMI of 18.5 to 24.9 is normal.  A BMI of 25 to 29.9 is considered overweight.  A BMI of 30 and above is considered obese.  Maintain normal blood lipids and cholesterol levels by exercising and minimizing your intake of saturated fat. Eat a balanced diet with plenty of fruit and vegetables. Blood tests for lipids and cholesterol should begin at age 49 and be repeated every 5 years. If your lipid or cholesterol levels are high, you are over 50, or you are at high risk for heart disease, you may need your cholesterol levels checked more frequently.Ongoing high lipid and cholesterol levels should be treated with medicines if diet and exercise are not working.  If you smoke, find out from your health care provider how to quit. If you do not use tobacco, do not start.  Lung cancer screening is recommended for adults aged 52-80 years who are at high risk for developing lung cancer because of a history of smoking. A yearly low-dose CT scan of the lungs is recommended for people who have at least a 30-pack-year history of smoking and are a current smoker or have quit within the past 15 years. A pack year of smoking is smoking an average of 1 pack of cigarettes a day for 1 year (for example: 1 pack a day for 30 years or 2 packs a day for  15 years). Yearly screening should continue until the smoker has stopped smoking for at least 15 years. Yearly screening should be stopped for people who develop a health problem that would prevent them from having lung cancer treatment.  High blood pressure causes heart disease and increases the risk of stroke. Your blood pressure should be checked at least every 1 to 2 years.  Ongoing high blood pressure should be treated with medicines if weight loss and exercise do not work.  If you are 10-30 years old, ask your health care provider if you should take aspirin to prevent strokes.  Diabetes screening involves taking a blood sample to check your fasting blood sugar level. This should be done once every 3 years, after age 3, if you are within normal weight and without risk factors for diabetes. Testing should be considered at a younger age or be carried out more frequently if you are overweight and have at least 1 risk factor for diabetes.  Breast cancer screening is essential preventive care for women. You should practice "breast self-awareness." This means understanding the normal appearance and feel of your breasts and may include breast self-examination. Any changes detected, no matter how small, should be reported to a health care provider. Women in their 75s and 30s should have a clinical breast exam (CBE) by a health care provider as part of a regular health exam every 1 to 3 years. After age 39, women should have a CBE every year. Starting at age 73, women should consider having a mammogram (breast X-ray test) every year. Women who have a family history of breast cancer should talk to their health care provider about genetic screening. Women at a high risk of breast cancer should talk to their health care providers about having an MRI and a mammogram every year.  Breast cancer gene (BRCA)-related cancer risk assessment is recommended for women who have family members with BRCA-related cancers. BRCA-related cancers include breast, ovarian, tubal, and peritoneal cancers. Having family members with these cancers may be associated with an increased risk for harmful changes (mutations) in the breast cancer genes BRCA1 and BRCA2. Results of the assessment will determine the need for genetic counseling and BRCA1 and BRCA2 testing.  Routine pelvic exams to screen for cancer are  no longer recommended for nonpregnant women who are considered low risk for cancer of the pelvic organs (ovaries, uterus, and vagina) and who do not have symptoms. Ask your health care provider if a screening pelvic exam is right for you.  If you have had past treatment for cervical cancer or a condition that could lead to cancer, you need Pap tests and screening for cancer for at least 20 years after your treatment. If Pap tests have been discontinued, your risk factors (such as having a new sexual partner) need to be reassessed to determine if screening should be resumed. Some women have medical problems that increase the chance of getting cervical cancer. In these cases, your health care provider may recommend more frequent screening and Pap tests.  Colorectal cancer can be detected and often prevented. Most routine colorectal cancer screening begins at the age of 30 years and continues through age 47 years. However, your health care provider may recommend screening at an earlier age if you have risk factors for colon cancer. On a yearly basis, your health care provider may provide home test kits to check for hidden blood in the stool. Use of a small camera at the end of a tube, to  directly examine the colon (sigmoidoscopy or colonoscopy), can detect the earliest forms of colorectal cancer. Talk to your health care provider about this at age 64, when routine screening begins. Direct exam of the colon should be repeated every 5-10 years through age 82 years, unless early forms of pre-cancerous polyps or small growths are found.  Hepatitis C blood testing is recommended for all people born from [birth year] through 1966 and any individual with known risks for hepatitis C.  Pra  Osteoporosis is a disease in which the bones lose minerals and strength with aging. This can result in serious bone fractures or breaks. The risk of osteoporosis can be identified using a bone density scan. Women ages 70 years and over  and women at risk for fractures or osteoporosis should discuss screening with their health care providers. Ask your health care provider whether you should take a calcium supplement or vitamin D to reduce the rate of osteoporosis.  Menopause can be associated with physical symptoms and risks. Hormone replacement therapy is available to decrease symptoms and risks. You should talk to your health care provider about whether hormone replacement therapy is right for you.  Use sunscreen. Apply sunscreen liberally and repeatedly throughout the day. You should seek shade when your shadow is shorter than you. Protect yourself by wearing long sleeves, pants, a wide-brimmed hat, and sunglasses year round, whenever you are outdoors.  Once a month, do a whole body skin exam, using a mirror to look at the skin on your back. Tell your health care provider of new moles, moles that have irregular borders, moles that are larger than a pencil eraser, or moles that have changed in shape or color.  Stay current with required vaccines (immunizations).  Influenza vaccine. All adults should be immunized every year.  Tetanus, diphtheria, and acellular pertussis (Td, Tdap) vaccine. Pregnant women should receive 1 dose of Tdap vaccine during each pregnancy. The dose should be obtained regardless of the length of time since the last dose. Immunization is preferred during the 27th-36th week of gestation. An adult who has not previously received Tdap or who does not know her vaccine status should receive 1 dose of Tdap. This initial dose should be followed by tetanus and diphtheria toxoids (Td) booster doses every 10 years. Adults with an unknown or incomplete history of completing a 3-dose immunization series with Td-containing vaccines should begin or complete a primary immunization series including a Tdap dose. Adults should receive a Td booster every 10 years.  Varicella vaccine. An adult without evidence of immunity to  varicella should receive 2 doses or a second dose if she has previously received 1 dose. Pregnant females who do not have evidence of immunity should receive the first dose after pregnancy. This first dose should be obtained before leaving the health care facility. The second dose should be obtained 4-8 weeks after the first dose.  Human papillomavirus (HPV) vaccine. Females aged 13-26 years who have not received the vaccine previously should obtain the 3-dose series. The vaccine is not recommended for use in pregnant females. However, pregnancy testing is not needed before receiving a dose. If a female is found to be pregnant after receiving a dose, no treatment is needed. In that case, the remaining doses should be delayed until after the pregnancy. Immunization is recommended for any person with an immunocompromised condition through the age of 34 years if she did not get any or all doses earlier. During the 3-dose series, the second dose should be  obtained 4-8 weeks after the first dose. The third dose should be obtained 24 weeks after the first dose and 16 weeks after the second dose.  Zoster vaccine. One dose is recommended for adults aged 39 years or older unless certain conditions are present.  Measles, mumps, and rubella (MMR) vaccine. Adults born before [birth year] generally are considered immune to measles and mumps. Adults born in [birth year] or later should have 1 or more doses of MMR vaccine unless there is a contraindication to the vaccine or there is laboratory evidence of immunity to each of the three diseases. A routine second dose of MMR vaccine should be obtained at least 28 days after the first dose for students attending postsecondary schools, health care workers, or international travelers. People who received inactivated measles vaccine or an unknown type of measles vaccine during 1963-1967 should receive 2 doses of MMR vaccine. People who received inactivated mumps vaccine or an unknown type of  mumps vaccine before 1979 and are at high risk for mumps infection should consider immunization with 2 doses of MMR vaccine. For females of childbearing age, rubella immunity should be determined. If there is no evidence of immunity, females who are not pregnant should be vaccinated. If there is no evidence of immunity, females who are pregnant should delay immunization until after pregnancy. Unvaccinated health care workers born before [birth year] who lack laboratory evidence of measles, mumps, or rubella immunity or laboratory confirmation of disease should consider measles and mumps immunization with 2 doses of MMR vaccine or rubella immunization with 1 dose of MMR vaccine.  Pneumococcal 13-valent conjugate (PCV13) vaccine. When indicated, a person who is uncertain of her immunization history and has no record of immunization should receive the PCV13 vaccine. An adult aged 96 years or older who has certain medical conditions and has not been previously immunized should receive 1 dose of PCV13 vaccine. This PCV13 should be followed with a dose of pneumococcal polysaccharide (PPSV23) vaccine. The PPSV23 vaccine dose should be obtained at least 8 weeks after the dose of PCV13 vaccine. An adult aged 63 years or older who has certain medical conditions and previously received 1 or more doses of PPSV23 vaccine should receive 1 dose of PCV13. The PCV13 vaccine dose should be obtained 1 or more years after the last PPSV23 vaccine dose.    Pneumococcal polysaccharide (PPSV23) vaccine. When PCV13 is also indicated, PCV13 should be obtained first. All adults aged 54 years and older should be immunized. An adult younger than age 75 years who has certain medical conditions should be immunized. Any person who resides in a nursing home or long-term care facility should be immunized. An adult smoker should be immunized. People with an immunocompromised condition and certain other conditions should receive both PCV13 and PPSV23  vaccines. People with human immunodeficiency virus (HIV) infection should be immunized as soon as possible after diagnosis. Immunization during chemotherapy or radiation therapy should be avoided. Routine use of PPSV23 vaccine is not recommended for American Indians, [place], or people younger than 65 years unless there are medical conditions that require PPSV23 vaccine. When indicated, people who have unknown immunization and have no record of immunization should receive PPSV23 vaccine. One-time revaccination 5 years after the first dose of PPSV23 is recommended for people aged 19-64 years who have chronic kidney failure, nephrotic syndrome, asplenia, or immunocompromised conditions. People who received 1-2 doses of PPSV23 before age 66 years should receive another dose of PPSV23 vaccine at age 75 years or later if  at least 5 years have passed since the previous dose. Doses of PPSV23 are not needed for people immunized with PPSV23 at or after age 65 years.  Preventive Services / Frequency   Ages 108 to 1 years  Blood pressure check.  Lipid and cholesterol check.  Lung cancer screening. / Every year if you are aged 34-80 years and have a 30-pack-year history of smoking and currently smoke or have quit within the past 15 years. Yearly screening is stopped once you have quit smoking for at least 15 years or develop a health problem that would prevent you from having lung cancer treatment.  Clinical breast exam.** / Every year after age 84 years.  BRCA-related cancer risk assessment.** / For women who have family members with a BRCA-related cancer (breast, ovarian, tubal, or peritoneal cancers).  Mammogram.** / Every year beginning at age 85 years and continuing for as long as you are in good health. Consult with your health care provider.  Pap test.** / Every 3 years starting at age 24 years through age 41 or 74 years with a history of 3 consecutive normal Pap tests.  HPV screening.** /  Every 3 years from ages 13 years through ages 80 to 86 years with a history of 3 consecutive normal Pap tests.  Fecal occult blood test (FOBT) of stool. / Every year beginning at age 64 years and continuing until age 49 years. You may not need to do this test if you get a colonoscopy every 10 years.  Flexible sigmoidoscopy or colonoscopy.** / Every 5 years for a flexible sigmoidoscopy or every 10 years for a colonoscopy beginning at age 89 years and continuing until age 65 years.  Hepatitis C blood test.** / For all people born from [birth year] through 1966 and any individual with known risks for hepatitis C.  Skin self-exam. / Monthly.  Influenza vaccine. / Every year.  Tetanus, diphtheria, and acellular pertussis (Tdap/Td) vaccine.** / Consult your health care provider. Pregnant women should receive 1 dose of Tdap vaccine during each pregnancy. 1 dose of Td every 10 years.  Varicella vaccine.** / Consult your health care provider. Pregnant females who do not have evidence of immunity should receive the first dose after pregnancy.  Zoster vaccine.** / 1 dose for adults aged 28 years or older.  Pneumococcal 13-valent conjugate (PCV13) vaccine.** / Consult your health care provider.  Pneumococcal polysaccharide (PPSV23) vaccine.** / 1 to 2 doses if you smoke cigarettes or if you have certain conditions.  Meningococcal vaccine.** / Consult your health care provider.  Hepatitis A vaccine.** / Consult your health care provider.  Hepatitis B vaccine.** / Consult your health care provider. Screening for abdominal aortic aneurysm (AAA)  by ultrasound is recommended for people over 50 who have history of high blood pressure or who are current or former smokers.

## 2015-01-10 NOTE — Progress Notes (Signed)
Complete Physical  Assessment and Plan: 1. Essential hypertension - continue medications, DASH diet, exercise and monitor at home. Call if greater than 130/80.  - CBC with Differential/Platelet - BASIC METABOLIC PANEL WITH GFR - Hepatic function panel - Urinalysis, Routine w reflex microscopic (not at [facility], [place]) - Microalbumin / creatinine urine ratio - EKG 12-Lead  2. Hypothyroidism, unspecified hypothyroidism type Hypothyroidism-check TSH level, continue medications the same, reminded to take on an empty stomach 30-79mins before food.  - TSH  3. Prediabetes Discussed general issues about diabetes pathophysiology and management., Educational material distributed., Suggested low cholesterol diet., Encouraged aerobic exercise., Discussed foot care., Reminded to get yearly retinal exam. - Hemoglobin A1c - Insulin, fasting  4. Morbid obesity, unspecified obesity type (HCC) Obesity with co morbidities- long discussion about weight loss, diet, and exercise  5. High cholesterol -continue medications, check lipids, decrease fatty foods, increase activity.  - Lipid panel  6. Medication management - Magnesium  7. Hot flashes, menopausal Continue lexapro, estrogen, on bASA  8. Bilateral leg and foot pain Weight loss advised  9. Vitamin D deficiency - VITAMIN D 25 Hydroxy (Vit-D Deficiency, Fractures)  10. Gastroesophageal reflux disease with esophagitis Continue PPI/H2 blocker, diet discussed  11. Biliary dyskinesia Continue PPI/H2 blocker, diet discussed  12. Encounter for general adult medical examination with abnormal findings - CBC with Differential/Platelet - BASIC METABOLIC PANEL WITH GFR - Hepatic function panel - TSH - Lipid panel - Hemoglobin A1c - VITAMIN D 25 Hydroxy (Vit-D Deficiency, Fractures) - Magnesium - Insulin, fasting - Urinalysis, Routine w reflex microscopic (not at [facility]) - Microalbumin / creatinine urine ratio - EKG 12-Lead - HIV antibody -  Hepatitis C antibody - fluticasone (FLONASE) 50 MCG/ACT nasal spray; Place 1 spray into both nostrils daily.  Dispense: 48 g; Refill: 2  13. Urinary frequency Check for infection - Urine culture Macrobid sent in   14. Yeast dermatitis - nystatin cream (MYCOSTATIN); Apply 1 application topically 2 (two) times daily.  Dispense: 30 g; Refill: 1 - nystatin (MYCOSTATIN/NYSTOP) 100000 UNIT/GM POWD; Apply daily after a shower as needed  Dispense: 60 g; Refill: 2  15. Screening for viral disease - HIV antibody - Hepatitis C antibody  Discussed med's effects and SE's. Screening labs and tests as requested with regular follow-up as recommended.  HPI  54 y.o. female  presents for a complete physical.  Her blood pressure has been controlled at home, today their BP is BP: 128/80 mmHg She does not workout due to knee pain. She denies chest pain, shortness of breath, dizziness.  She is on cholesterol medication, lipitor 20 1/2 pill daily and denies myalgias. Her cholesterol is at goal. The cholesterol last visit was:   Lab Results  Component Value Date   CHOL 146 09/24/2014   HDL 50 09/24/2014   LDLCALC 65 09/24/2014   TRIG 155* 09/24/2014   CHOLHDL 2.9 09/24/2014    She has been working on diet and exercise for prediabetes, and denies paresthesia of the feet, polydipsia, polyuria and visual disturbances. Last A1C in the office was:  Lab Results  Component Value Date   HGBA1C 5.9* 09/24/2014   Patient is on Vitamin D supplement.   She has hot flashes, is on lexapro, low dose estrogen and bASA.  Patient also complains of UTI symptoms for 1 week. she complains of dysuria, urinary frequency and urinary urgency and denies chills, hematuria and fever, flank pain..  She is on thyroid medication. Her medication was not changed last visit.  Lab Results  Component Value Date   TSH 0.911 09/24/2014  .  BMI is Body mass index is 47.23 kg/(m^2)., she is struggling with weight loss due to stress,  time limitations. Wt Readings from Last 3 Encounters:  01/10/15 246 lb (111.585 kg)  09/24/14 251 lb 9.6 oz (114.125 kg)  06/19/14 252 lb (114.306 kg)    Current Medications:  Current Outpatient Prescriptions on File Prior to Visit  Medication Sig Dispense Refill  . albuterol (PROVENTIL HFA;VENTOLIN HFA) 108 (90 BASE) MCG/ACT inhaler Inhale 2 puffs into the lungs every 6 (six) hours as needed for wheezing or shortness of breath. 18 g 2  . aMILoride (MIDAMOR) 5 MG tablet TAKE 1 TABLET BY MOUTH EVERY MORNING. 90 tablet 1  . Ascorbic Acid (VITAMIN C) 1000 MG tablet Take 1,000 mg by mouth daily.    Marland Kitchen. aspirin 325 MG EC tablet Take 325 mg by mouth every morning.     Marland Kitchen. atorvastatin (LIPITOR) 20 MG tablet TAKE 1 TABLET BY MOUTH ONCE DAILY 90 tablet PRN  . candesartan (ATACAND) 32 MG tablet TAKE 1/2 TO 1 TABLET BY MOUTH EVERY NIGHT AS DIRECTED FOR BLOOD PRESSURE 90 tablet 0  . cetirizine (ZYRTEC) 10 MG tablet Take 10 mg by mouth daily.    . Cholecalciferol (VITAMIN D-3) 5000 UNITS TABS Take 5,000 Units by mouth daily.    . cyanocobalamin 2000 MCG tablet Take 2,000 mcg by mouth daily.    Marland Kitchen. EPINEPHRINE 0.3 mg/0.3 mL IJ SOAJ injection USE AS DIRECTED 2 Device PRN  . escitalopram (LEXAPRO) 20 MG tablet TAKE 1 TABLET BY MOUTH ONCE DAILY 30 tablet PRN  . esomeprazole (NEXIUM) 40 MG capsule TAKE 1 CAPSULE BY MOUTH DAILY. 90 capsule 3  . estradiol (ESTRACE) 1 MG tablet 1/2-1 pill daily for vasomotor symptoms 30 tablet 3  . famciclovir (FAMVIR) 500 MG tablet Take 500 mg by mouth 3 (three) times daily. Take AD for fever blister    . fluconazole (DIFLUCAN) 150 MG tablet Take 150 mg by mouth once a week. PRN yeast    . fluticasone (FLONASE) 50 MCG/ACT nasal spray USE 1 SPRAY IN EACH NOSTRIL ONCE DAILY 16 g 2  . Glucosamine-Chondroitin (GLUCOSAMINE CHONDR COMPLEX PO) Take 1,500 mg by mouth. 1500mg /1200mg   Actual dose    . levothyroxine (SYNTHROID, LEVOTHROID) 137 MCG tablet Take 137 mcg by mouth daily before  breakfast.    . metFORMIN (GLUCOPHAGE-XR) 500 MG 24 hr tablet TAKE 2 TABLETS BY MOUTH TWICE DAILY 360 tablet 1  . propranolol (INDERAL) 80 MG tablet TAKE 1 TABLET BY MOUTH TWICE DAILY 180 tablet PRN  . traMADol (ULTRAM) 50 MG tablet Take 1 tablet (50 mg total) by mouth every 6 (six) hours as needed. 60 tablet 3   No current facility-administered medications on file prior to visit.   Health Maintenance:   Immunization History  Administered Date(s) Administered  . DTaP 12/07/2012  . Influenza-Unspecified 11/22/2012, 11/12/2014  . Pneumococcal Polysaccharide-23 02/22/2009  . Td 02/23/2000  . Tdap 12/07/2012  . Zoster 10/29/2010   Tetanus: 2014 Pneumovax: 2011 Flu vaccine: 2016 Prevnar 13: N/A Zostavax:2012 LMP: 2005 p. Hysterectomy, 1 ovary and cervix Pap: 11/2013,  + stress incontinence  MGM: 10/2013 category A, no SBE DEXA: N/A TB gold- negative Colonoscopy: 04/2014, Dr. Madilyn FiremanHayes.  EGD: N/A CT AB 2009 CXR 12/2012 US thyroid and BX 2013, Dr. Sharl MaKerr.  Last Dental Exam: Dr. Dan HumphreysWalker Last Eye Exam: Dr. Charise Killianotter   Patient Care Team: Lucky CowboyWilliam McKeown, MD as PCP - General (  Internal Medicine) Dorena Cookey, MD as Consulting Physician (Gastroenterology) Talmage Coin, MD as Consulting Physician (Endocrinology)  Allergies:  Allergies  Allergen Reactions  . Avelox [Moxifloxacin Hcl In Nacl] Hives    Thrush, throat might have closed   . Hydrochlorothiazide Itching    On hands and feet  . Other Itching    All "Cillins", hives alao  . Penicillins     Hives  . Prednisone     Chest pains  . Zostavax [Zoster Vaccine Live]   . Codeine Hives, Itching and Rash  . Levothyroxine Rash    Only to generic. Not to brand synthroid.   . Sulfa Antibiotics Itching and Rash    Hands and feet itch and turn red   Medical History:  Past Medical History  Diagnosis Date  . Multinodular goiter   . High cholesterol   . Hypothyroidism   . GERD (gastroesophageal reflux disease)   . Hypertension     Surgical History:  Past Surgical History  Procedure Laterality Date  . Carpal tunnel release  yrs ago    both wrists  . Knee surgery  arthroscopic, 3 yrs ago    right knee twice  . Tonsillectomy  age 44  . Abdominal hysterectomy  8 yrs ago  . Tubal ligation  yrs ago  . Radioactive iodine to thyroid'  march 2013  . Cholecystectomy  12/28/2011    Procedure: LAPAROSCOPIC CHOLECYSTECTOMY WITH INTRAOPERATIVE CHOLANGIOGRAM;  Surgeon: Adolph Pollack, MD;  Location: [facility];  Service: General;  Laterality: N/A;  Laparoscopic cholecystectomy with attempted cholangiogram   Family History:  Family History  Problem Relation Age of Onset  . Stroke Mother   . Cancer Mother     Breast  . Stroke Father   . Heart attack Father    Social History:  Social History  Substance Use Topics  . Smoking status: Never Smoker   . Smokeless tobacco: Never Used  . Alcohol Use: No   Review of Systems  Constitutional: Negative.  Negative for fever and chills.  HENT: Negative.   Eyes: Negative.   Respiratory: Negative.  Negative for shortness of breath.   Cardiovascular: Negative.  Negative for chest pain.  Gastrointestinal: Negative.   Genitourinary: Positive for dysuria, urgency and frequency. Negative for hematuria and flank pain.  Musculoskeletal: Positive for joint pain. Negative for myalgias, back pain, falls and neck pain.  Skin: Positive for rash (under breast).  Neurological: Negative.   Endo/Heme/Allergies: Negative.   Psychiatric/Behavioral: Negative.  Negative for depression. The patient does not have insomnia.      Physical Exam: Estimated body mass index is 47.23 kg/(m^2) as calculated from the following:   Height as of this encounter: 5' 0.5" (1.537 m).   Weight as of this encounter: 246 lb (111.585 kg). BP 128/80 mmHg  Pulse 67  Temp(Src) 97.3 F (36.3 C) (Temporal)  Resp 16  Ht 5' 0.5" (1.537 m)  Wt 246 lb (111.585 kg)  BMI 47.23 kg/m2  SpO2 97% General Appearance: Well  nourished, in no apparent distress.  Eyes: PERRLA, EOMs, conjunctiva no swelling or erythema, normal fundi and vessels.  Sinuses: No Frontal/maxillary tenderness  ENT/Mouth: Ext aud canals clear, normal light reflex with TMs without erythema, bulging. Good dentition. No erythema, swelling, or exudate on post pharynx. Tonsils not swollen or erythematous. Hearing normal.  Neck: Supple, thyroid normal. No bruits  Respiratory: Respiratory effort normal, BS equal bilaterally without rales, rhonchi, wheezing or stridor.  Cardio: RRR without murmurs, rubs or gallops. Brisk  peripheral pulses without edema.  Chest: symmetric, with normal excursions and percussion.  Breasts: Symmetric, without lumps, nipple discharge, retractions. + well circumscribed erythematous rash under bilateral breast with satellite lesions.  Abdomen: Soft, nontender, obese, no guarding, rebound, hernias, masses, or organomegaly. .  Lymphatics: Non tender without lymphadenopathy.  Genitourinary: defer Musculoskeletal: Full ROM all peripheral extremities,5/5 strength, and normal gait.  Skin: Warm, dry without rashes, lesions, ecchymosis. Neuro: Cranial nerves intact, reflexes equal bilaterally. Normal muscle tone, no cerebellar symptoms. Sensation intact.  Psych: Awake and oriented X 3, normal affect, Insight and Judgment appropriate.   EKG: WNL no changes. AORTA SCAN: defer   Quentin Mulling 9:32 AM [facility]

## 2015-01-11 LAB — URINALYSIS, MICROSCOPIC ONLY
Casts: NONE SEEN [LPF]
Crystals: NONE SEEN [HPF]
RBC / HPF: NONE SEEN RBC/HPF (ref <=2)
Squamous Epithelial / LPF: NONE SEEN [HPF] (ref <=5)
YEAST: NONE SEEN [HPF]

## 2015-01-11 LAB — INSULIN, FASTING: Insulin fasting, serum: 20 u[IU]/mL — ABNORMAL HIGH (ref 2.0–19.6)

## 2015-01-11 LAB — LIPID PANEL
CHOL/HDL RATIO: 3.6 ratio (ref <=5.0)
Cholesterol: 145 mg/dL (ref 125–200)
HDL: 40 mg/dL — AB (ref >=46)
LDL CALC: 77 mg/dL (ref <130)
TRIGLYCERIDES: 139 mg/dL (ref <150)
VLDL: 28 mg/dL (ref <30)

## 2015-01-11 LAB — HIV ANTIBODY (ROUTINE TESTING W REFLEX): HIV: NONREACTIVE

## 2015-01-11 LAB — VITAMIN D 25 HYDROXY (VIT D DEFICIENCY, FRACTURES): Vit D, 25-Hydroxy: 51 ng/mL (ref 30–100)

## 2015-01-11 LAB — HEPATIC FUNCTION PANEL
ALK PHOS: 76 U/L (ref 33–130)
ALT: 18 U/L (ref 6–29)
AST: 18 U/L (ref 10–35)
Albumin: 4.1 g/dL (ref 3.6–5.1)
BILIRUBIN DIRECT: 0.1 mg/dL (ref <=0.2)
BILIRUBIN INDIRECT: 0.3 mg/dL (ref 0.2–1.2)
BILIRUBIN TOTAL: 0.4 mg/dL (ref 0.2–1.2)
Total Protein: 6.7 g/dL (ref 6.1–8.1)

## 2015-01-11 LAB — BASIC METABOLIC PANEL WITH GFR
BUN: 14 mg/dL (ref 7–25)
CHLORIDE: 102 mmol/L (ref 98–110)
CO2: 25 mmol/L (ref 20–31)
Calcium: 9.6 mg/dL (ref 8.6–10.4)
Creat: 0.72 mg/dL (ref 0.50–1.05)
Glucose, Bld: 90 mg/dL (ref 65–99)
POTASSIUM: 4.9 mmol/L (ref 3.5–5.3)
SODIUM: 139 mmol/L (ref 135–146)

## 2015-01-11 LAB — URINALYSIS, ROUTINE W REFLEX MICROSCOPIC
Bilirubin Urine: NEGATIVE
GLUCOSE, UA: NEGATIVE
HGB URINE DIPSTICK: NEGATIVE
Ketones, ur: NEGATIVE
NITRITE: NEGATIVE
PH: 5.5 (ref 5.0–8.0)
PROTEIN: NEGATIVE
Specific Gravity, Urine: 1.018 (ref 1.001–1.035)

## 2015-01-11 LAB — TSH: TSH: 0.67 u[IU]/mL (ref 0.350–4.500)

## 2015-01-11 LAB — MICROALBUMIN / CREATININE URINE RATIO
Creatinine, Urine: 102 mg/dL (ref 20–320)
MICROALB UR: 0.3 mg/dL
MICROALB/CREAT RATIO: 3 ug/mg{creat} (ref <30)

## 2015-01-11 LAB — HEPATITIS C ANTIBODY: HCV Ab: NEGATIVE

## 2015-01-11 LAB — MAGNESIUM: Magnesium: 1.7 mg/dL (ref 1.5–2.5)

## 2015-01-12 LAB — URINE CULTURE

## 2015-01-12 MED ORDER — CEFUROXIME AXETIL 250 MG PO TABS: 250.0000 mg | ORAL_TABLET | Freq: Two times a day (BID) | ORAL | Status: DC

## 2015-01-12 NOTE — Addendum Note (Signed)
Addended by: Quentin MullingOLLIER, Kirsta Probert R on: 01/12/2015 07:48 PM   Modules accepted: Orders, SmartSet

## 2015-01-30 ENCOUNTER — Other Ambulatory Visit: Payer: 59

## 2015-01-30 DIAGNOSIS — R350 Frequency of micturition: Secondary | ICD-10-CM

## 2015-01-30 LAB — URINALYSIS, ROUTINE W REFLEX MICROSCOPIC
Bilirubin Urine: NEGATIVE
Glucose, UA: NEGATIVE
HGB URINE DIPSTICK: NEGATIVE
Ketones, ur: NEGATIVE
LEUKOCYTES UA: NEGATIVE
NITRITE: NEGATIVE
PROTEIN: NEGATIVE
Specific Gravity, Urine: 1.005 (ref 1.001–1.035)
pH: 5.5 (ref 5.0–8.0)

## 2015-01-31 LAB — URINE CULTURE

## 2015-02-03 ENCOUNTER — Other Ambulatory Visit: Payer: Self-pay | Admitting: Physician Assistant

## 2015-02-11 ENCOUNTER — Encounter: Payer: Self-pay | Admitting: Physician Assistant

## 2015-02-11 MED ORDER — FLUCONAZOLE 150 MG PO TABS: 150.0000 mg | ORAL_TABLET | Freq: Every day | ORAL | Status: DC

## 2015-02-25 MED FILL — ATORVASTATIN 20 MG TABLET: 20 | 90 days supply | Qty: 90 | Fill #1

## 2015-02-25 MED FILL — ESCITALOPRAM 20 MG TABLET: 20 | 30 days supply | Qty: 30 | Fill #5

## 2015-03-04 MED FILL — SYNTHROID 137 MCG TABLET: 137 | 90 days supply | Qty: 90 | Fill #3

## 2015-03-24 ENCOUNTER — Other Ambulatory Visit: Payer: Self-pay | Admitting: Internal Medicine

## 2015-03-24 MED FILL — METFORMIN HCL ER 500 MG TAB: 500 | 90 days supply | Qty: 360 | Fill #1

## 2015-03-24 MED FILL — ESCITALOPRAM 20 MG TABLET: 20 | 30 days supply | Qty: 30 | Fill #6

## 2015-03-24 MED FILL — CANDESARTAN CILEXETIL 32 MG: 32 | 90 days supply | Qty: 90 | Fill #0

## 2015-03-24 MED FILL — ESOMEPRAZOLE MAG DR 40 MG C: 40 | 90 days supply | Qty: 90 | Fill #0

## 2015-03-25 DIAGNOSIS — M67441 Ganglion, right hand: Secondary | ICD-10-CM | POA: Diagnosis not present

## 2015-04-01 MED FILL — ESTRADIOL 1 MG TABLET: 1 | 30 days supply | Qty: 30 | Fill #1

## 2015-04-29 MED FILL — ESCITALOPRAM 20 MG TABLET: 20 | 30 days supply | Qty: 30 | Fill #7

## 2015-05-07 ENCOUNTER — Ambulatory Visit: Payer: Self-pay | Admitting: Physician Assistant

## 2015-05-07 MED FILL — PROPRANOLOL 80 MG TABLET: 80 | 90 days supply | Qty: 180 | Fill #3

## 2015-05-28 ENCOUNTER — Other Ambulatory Visit: Payer: Self-pay | Admitting: Internal Medicine

## 2015-05-28 MED FILL — ESTRADIOL 1 MG TABLET: 1 | 30 days supply | Qty: 30 | Fill #2

## 2015-05-28 MED FILL — ESCITALOPRAM 20 MG TABLET: 20 | 30 days supply | Qty: 30 | Fill #8

## 2015-05-28 MED FILL — aMILoride HCL 5 MG TABS: 5 | 90 days supply | Qty: 90 | Fill #0

## 2015-06-04 DIAGNOSIS — Z803 Family history of malignant neoplasm of breast: Secondary | ICD-10-CM | POA: Diagnosis not present

## 2015-06-04 DIAGNOSIS — Z1231 Encounter for screening mammogram for malignant neoplasm of breast: Secondary | ICD-10-CM | POA: Diagnosis not present

## 2015-06-04 MED FILL — SYNTHROID 137 MCG TABLET: 137 | 90 days supply | Qty: 90 | Fill #0

## 2015-06-13 ENCOUNTER — Ambulatory Visit (INDEPENDENT_AMBULATORY_CARE_PROVIDER_SITE_OTHER): Payer: 59 | Admitting: Physician Assistant

## 2015-06-13 ENCOUNTER — Encounter: Payer: Self-pay | Admitting: Physician Assistant

## 2015-06-13 VITALS — BP 118/74 | HR 63 | Temp 97.2°F | Resp 16 | Ht 60.5 in | Wt 242.2 lb

## 2015-06-13 DIAGNOSIS — I10 Essential (primary) hypertension: Secondary | ICD-10-CM | POA: Diagnosis not present

## 2015-06-13 DIAGNOSIS — R7303 Prediabetes: Secondary | ICD-10-CM

## 2015-06-13 DIAGNOSIS — R7309 Other abnormal glucose: Secondary | ICD-10-CM | POA: Diagnosis not present

## 2015-06-13 DIAGNOSIS — E7800 Pure hypercholesterolemia, unspecified: Secondary | ICD-10-CM

## 2015-06-13 DIAGNOSIS — Z79899 Other long term (current) drug therapy: Secondary | ICD-10-CM

## 2015-06-13 DIAGNOSIS — E039 Hypothyroidism, unspecified: Secondary | ICD-10-CM | POA: Diagnosis not present

## 2015-06-13 DIAGNOSIS — E559 Vitamin D deficiency, unspecified: Secondary | ICD-10-CM | POA: Diagnosis not present

## 2015-06-13 LAB — CBC WITH DIFFERENTIAL/PLATELET
BASOS ABS: 99 {cells}/uL (ref 0–200)
Basophils Relative: 1 %
EOS PCT: 5 %
Eosinophils Absolute: 495 cells/uL (ref 15–500)
HCT: 39.7 % (ref 35.0–45.0)
Hemoglobin: 13.5 g/dL (ref 11.7–15.5)
LYMPHS PCT: 24 %
Lymphs Abs: 2376 cells/uL (ref 850–3900)
MCH: 29 pg (ref 27.0–33.0)
MCHC: 34 g/dL (ref 32.0–36.0)
MCV: 85.4 fL (ref 80.0–100.0)
MONOS PCT: 7 %
MPV: 10.4 fL (ref 7.5–12.5)
Monocytes Absolute: 693 cells/uL (ref 200–950)
NEUTROS ABS: 6237 {cells}/uL (ref 1500–7800)
NEUTROS PCT: 63 %
PLATELETS: 296 10*3/uL (ref 140–400)
RBC: 4.65 MIL/uL (ref 3.80–5.10)
RDW: 13.8 % (ref 11.0–15.0)
WBC: 9.9 10*3/uL (ref 3.8–10.8)

## 2015-06-13 LAB — BASIC METABOLIC PANEL WITH GFR
BUN: 12 mg/dL (ref 7–25)
CALCIUM: 9.5 mg/dL (ref 8.6–10.4)
CO2: 27 mmol/L (ref 20–31)
CREATININE: 0.7 mg/dL (ref 0.50–1.05)
Chloride: 97 mmol/L — ABNORMAL LOW (ref 98–110)
GFR, Est African American: >89 mL/min (ref >=60)
GFR, Est Non African American: >89 mL/min (ref >=60)
Glucose, Bld: 99 mg/dL (ref 65–99)
Potassium: 4.3 mmol/L (ref 3.5–5.3)
SODIUM: 133 mmol/L — AB (ref 135–146)

## 2015-06-13 LAB — LIPID PANEL
CHOL/HDL RATIO: 3 ratio (ref <=5.0)
Cholesterol: 139 mg/dL (ref 125–200)
HDL: 47 mg/dL (ref >=46)
LDL CALC: 60 mg/dL (ref <130)
TRIGLYCERIDES: 160 mg/dL — AB (ref <150)
VLDL: 32 mg/dL — AB (ref <30)

## 2015-06-13 LAB — MAGNESIUM: Magnesium: 1.6 mg/dL (ref 1.5–2.5)

## 2015-06-13 LAB — HEPATIC FUNCTION PANEL
ALBUMIN: 4.1 g/dL (ref 3.6–5.1)
ALT: 21 U/L (ref 6–29)
AST: 18 U/L (ref 10–35)
Alkaline Phosphatase: 91 U/L (ref 33–130)
BILIRUBIN DIRECT: 0.1 mg/dL (ref <=0.2)
BILIRUBIN TOTAL: 0.5 mg/dL (ref 0.2–1.2)
Indirect Bilirubin: 0.4 mg/dL (ref 0.2–1.2)
Total Protein: 6.8 g/dL (ref 6.1–8.1)

## 2015-06-13 LAB — TSH: TSH: 0.63 mIU/L

## 2015-06-13 LAB — HEMOGLOBIN A1C
HEMOGLOBIN A1C: 5.8 % — AB (ref <5.7)
Mean Plasma Glucose: 120 mg/dL

## 2015-06-13 NOTE — Patient Instructions (Addendum)
HOME CARE INSTRUCTIONS   Do not stand or sit in one position for long periods of time. Do not sit with your legs crossed. Rest with your legs raised during the day.  Your legs have to be higher than your heart so that gravity will force the valves to open, so please really elevate your legs.   Wear elastic stockings or support hose. Do not wear other tight, encircling garments around the legs, pelvis, or waist.  [employer]  has a wide variety of well priced compression stockings. [address], [place] [address] [phone]  Walk as much as possible to increase blood flow.  Raise the foot of your bed at night with 2-inch blocks. SEEK MEDICAL CARE IF:   The skin around your ankle starts to break down.  You have pain, redness, tenderness, or hard swelling developing in your leg over a vein.  You are uncomfortable due to leg pain. Document Released: 11/18/2004 Document Revised: 05/03/2011 Document Reviewed: 04/06/2010 [employer] Patient Information 2014 [employer].   We want weight loss that will last so you should lose 1-2 pounds a week.  THAT IS IT! Please pick THREE things a month to change. Once it is a habit check off the item. Then pick another three items off the list to become habits.  If you are already doing a habit on the list GREAT!  Cross that item off! o Don't drink your calories. Ie, alcohol, soda, fruit juice, and sweet tea.  o Drink more water. Drink a glass when you feel hungry or before each meal.  o Eat breakfast - Complex carb and protein (likeDannon light and fit yogurt, oatmeal, fruit, eggs, turkey bacon). o Measure your cereal.  Eat no more than one cup a day. (ie Kashi) o Eat an apple a day. o Add a vegetable a day. o Try a new vegetable a month. o Use Pam! Stop using oil or butter to cook. o Don't finish your plate or use smaller plates. o Share your dessert. o Eat sugar free Jello for dessert or frozen grapes. o Don't eat 2-3 hours  before bed. o Switch to whole wheat bread, pasta, and brown rice. o Make healthier choices when you eat out. No fries! o Pick baked chicken, NOT fried. o Don't forget to SLOW DOWN when you eat. It is not going anywhere.  o Take the stairs. o Park far away in the parking lot o Lift soup cans (or weights) for 10 minutes while watching TV. o Walk at work for 10 minutes during break. o Walk outside 1 time a week with your friend, kids, dog, or significant other. o Start a walking group at church. o Walk the mall as much as you can tolerate.  o Keep a food diary. o Weigh yourself daily. o Walk for 15 minutes 3 days per week. o Cook at home more often and eat out less.  If life happens and you go back to old habits, it is okay.  Just start over. You can do it!   If you experience chest pain, get short of breath, or tired during the exercise, please stop immediately and inform your doctor.   

## 2015-06-13 NOTE — Progress Notes (Signed)
Assessment and Plan:  1. Hypertension -Continue medication, monitor blood pressure at home. Continue DASH diet.  Reminder to go to the ER if any CP, SOB, nausea, dizziness, severe HA, changes vision/speech, left arm numbness and tingling and jaw pain.  2. Cholesterol -Continue diet and exercise. Check cholesterol.   3. Prediabetes  -Continue diet and exercise. Check A1C  4. Vitamin D Def - check level and continue medications.   5. Morbid Obesity with co morbidities-  long discussion about weight loss, diet, and exercise  6. Hypothyroidism -check TSH level, continue medications the same, reminded to take on an empty stomach 30-90mins before food.   Continue diet and meds as discussed. Further disposition pending results of labs.Over 30 minutes of exam, counseling, chart review, and critical decision making was performed  HPI 55 y.o. female  presents for 3 month follow up on hypertension, cholesterol, prediabetes, and vitamin D deficiency.   Her blood pressure has been controlled at home, today their BP is BP: 118/74 mmHg  She does not workout. She denies chest pain, shortness of breath, dizziness.  She is on cholesterol medication, lipitor 20 1/2 pill daily and denies myalgias. Her cholesterol is at goal. The cholesterol last visit was:   Lab Results  Component Value Date   CHOL 145 01/10/2015   HDL 40* 01/10/2015   LDLCALC 77 01/10/2015   TRIG 139 01/10/2015   CHOLHDL 3.6 01/10/2015   She has been working on diet and exercise for prediabetes, and denies paresthesia of the feet, polydipsia, polyuria and visual disturbances.  Last A1C in the office was:  Lab Results  Component Value Date   HGBA1C 5.9* 01/10/2015  Patient is on Vitamin D supplement, 5000 IU dialy.    Lab Results  Component Value Date   VD25OH 51 01/10/2015   She is on thyroid medication. Her medication was not changed last visit, follows with Dr. Sharl Ma, but will start to monitor here.     Lab Results   Component Value Date   TSH 0.670 01/10/2015  She has hot flashes but states it is better with lexapro and  low dose estrogen and bASA.  BMI is Body mass index is 46.5 kg/(m^2)., she is working on diet and exercise. Wt Readings from Last 3 Encounters:  06/13/15 242 lb 3.2 oz (109.861 kg)  01/10/15 246 lb (111.585 kg)  09/24/14 251 lb 9.6 oz (114.125 kg)     Current Medications:  Current Outpatient Prescriptions on File Prior to Visit  Medication Sig Dispense Refill  . albuterol (PROVENTIL HFA;VENTOLIN HFA) 108 (90 BASE) MCG/ACT inhaler Inhale 2 puffs into the lungs every 6 (six) hours as needed for wheezing or shortness of breath. 18 g 2  . aMILoride (MIDAMOR) 5 MG tablet TAKE 1 TABLET BY MOUTH EVERY MORNING. 90 tablet 0  . Ascorbic Acid (VITAMIN C) 1000 MG tablet Take 1,000 mg by mouth daily.    Marland Kitchen aspirin 325 MG EC tablet Take 325 mg by mouth every morning.     Marland Kitchen atorvastatin (LIPITOR) 20 MG tablet TAKE 1 TABLET BY MOUTH ONCE DAILY 90 tablet PRN  . candesartan (ATACAND) 32 MG tablet TAKE 1/2 TO 1 TABLET BY MOUTH EVERY NIGHT AS DIRECTED FOR BLOOD PRESSURE 90 tablet 0  . cefUROXime (CEFTIN) 250 MG tablet Take 1 tablet (250 mg total) by mouth 2 (two) times daily. 20 tablet 0  . cetirizine (ZYRTEC) 10 MG tablet Take 10 mg by mouth daily.    . Cholecalciferol (VITAMIN D-3) 5000 UNITS  TABS Take 5,000 Units by mouth daily.    . cyanocobalamin 2000 MCG tablet Take 2,000 mcg by mouth daily.    Marland Kitchen EPINEPHRINE 0.3 mg/0.3 mL IJ SOAJ injection USE AS DIRECTED 2 Device PRN  . escitalopram (LEXAPRO) 20 MG tablet TAKE 1 TABLET BY MOUTH ONCE DAILY 30 tablet PRN  . esomeprazole (NEXIUM) 40 MG capsule TAKE 1 CAPSULE BY MOUTH DAILY. 90 capsule 3  . estradiol (ESTRACE) 1 MG tablet TAKE 1/2-1 TABLET BY MOUTH DAILY FOR VASOMOTOR SYMPTOMS 30 tablet 11  . famciclovir (FAMVIR) 500 MG tablet Take 500 mg by mouth 3 (three) times daily. Take AD for fever blister    . fluconazole (DIFLUCAN) 150 MG tablet Take 1  tablet (150 mg total) by mouth daily. 1 tablet 3  . fluticasone (FLONASE) 50 MCG/ACT nasal spray Place 1 spray into both nostrils daily. 48 g 2  . Glucosamine-Chondroitin (GLUCOSAMINE CHONDR COMPLEX PO) Take 1,500 mg by mouth. /1200mg   Actual dose    . levothyroxine (SYNTHROID, LEVOTHROID) 137 MCG tablet Take 137 mcg by mouth daily before breakfast.    . metFORMIN (GLUCOPHAGE-XR) 500 MG 24 hr tablet TAKE 2 TABLETS BY MOUTH TWICE DAILY 360 tablet 1  . propranolol (INDERAL) 80 MG tablet TAKE 1 TABLET BY MOUTH TWICE DAILY 180 tablet PRN  . traMADol (ULTRAM) 50 MG tablet Take 1 tablet (50 mg total) by mouth every 6 (six) hours as needed. 60 tablet 3   No current facility-administered medications on file prior to visit.   Medical History:  Past Medical History  Diagnosis Date  . Multinodular goiter   . High cholesterol   . Hypothyroidism   . GERD (gastroesophageal reflux disease)   . Hypertension    Allergies:  Allergies  Allergen Reactions  . Avelox [Moxifloxacin Hcl In Nacl] Hives    Thrush, throat might have closed   . Hydrochlorothiazide Itching    On hands and feet  . Other Itching    All "Cillins", hives alao  . Penicillins     Hives  . Prednisone     Chest pains  . Zostavax [Zoster Vaccine Live]   . Codeine Hives, Itching and Rash  . Levothyroxine Rash    Only to generic. Not to brand synthroid.   . Sulfa Antibiotics Itching and Rash    Hands and feet itch and turn red    Review of Systems:  Review of Systems  Constitutional: Negative.        + hot flashes  HENT: Negative.   Eyes: Negative.   Respiratory: Negative.   Cardiovascular: Positive for leg swelling. Negative for chest pain, palpitations, orthopnea, claudication and PND.  Gastrointestinal: Negative.   Genitourinary: Negative.   Musculoskeletal: Negative.   Skin: Negative.   Neurological: Negative.   Endo/Heme/Allergies: Negative.   Psychiatric/Behavioral: Negative.     Family history- Review  and unchanged Social history- Review and unchanged Physical Exam: BP 118/74 mmHg  Pulse 63  Temp(Src) 97.2 F (36.2 C) (Temporal)  Resp 16  Ht 5' 0.5" (1.537 m)  Wt 242 lb 3.2 oz (109.861 kg)  BMI 46.50 kg/m2  SpO2 96% Wt Readings from Last 3 Encounters:  06/13/15 242 lb 3.2 oz (109.861 kg)  01/10/15 246 lb (111.585 kg)  09/24/14 251 lb 9.6 oz (114.125 kg)   General Appearance: Well nourished, in no apparent distress. Eyes: PERRLA, EOMs, conjunctiva no swelling or erythema Sinuses: No Frontal/maxillary tenderness ENT/Mouth: Ext aud canals clear, TMs without erythema, bulging. No erythema, swelling, or exudate on  post pharynx.  Tonsils not swollen or erythematous. Hearing normal.  Neck: Supple, thyroid normal.  Respiratory: Respiratory effort normal, BS equal bilaterally without rales, rhonchi, wheezing or stridor.  Cardio: RRR with no MRGs. Brisk peripheral pulses with 2+ edema.  Abdomen: Soft, + BS, obese,  Non tender, no guarding, rebound, hernias, masses. Lymphatics: Non tender without lymphadenopathy.  Musculoskeletal: Full ROM, 5/5 strength Skin: Warm, dry without rashes, lesions, ecchymosis.  Neuro: Cranial nerves intact. Normal muscle tone, no cerebellar symptoms. Psych: Awake and oriented X 3, normal affect, Insight and Judgment appropriate.    Quentin MullingAmanda Youssouf Shipley, PA-C 9:13 AM [facility]

## 2015-06-14 LAB — VITAMIN D 25 HYDROXY (VIT D DEFICIENCY, FRACTURES): Vit D, 25-Hydroxy: 48 ng/mL (ref 30–100)

## 2015-06-14 LAB — INSULIN, FASTING: INSULIN FASTING, SERUM: 14.9 u[IU]/mL (ref 2.0–19.6)

## 2015-06-17 MED FILL — ESOMEPRAZOLE MAG DR 40 MG C: 40 | 90 days supply | Qty: 90 | Fill #1

## 2015-06-23 ENCOUNTER — Other Ambulatory Visit: Payer: Self-pay | Admitting: Internal Medicine

## 2015-06-23 MED FILL — METFORMIN HCL ER 500 MG TAB: 500 | 90 days supply | Qty: 360 | Fill #0

## 2015-06-30 MED FILL — ESCITALOPRAM 20 MG TABLET: 20 | 30 days supply | Qty: 30 | Fill #9

## 2015-07-08 MED FILL — ESTRADIOL 1 MG TABLET: 1 | 30 days supply | Qty: 30 | Fill #3

## 2015-08-04 MED FILL — ESCITALOPRAM 20 MG TABLET: 20 | 30 days supply | Qty: 30 | Fill #10

## 2015-08-04 MED FILL — PROPRANOLOL 80 MG TABLET: 80 | 90 days supply | Qty: 180 | Fill #4

## 2015-08-11 MED FILL — ESTRADIOL 1 MG TABLET: 1 | 90 days supply | Qty: 90 | Fill #4

## 2015-08-27 ENCOUNTER — Other Ambulatory Visit: Payer: Self-pay | Admitting: Physician Assistant

## 2015-08-27 MED FILL — aMILoride HCL 5 MG TABS: 5 | 90 days supply | Qty: 90 | Fill #0

## 2015-08-27 MED FILL — ATORVASTATIN 20 MG TABLET: 20 | 90 days supply | Qty: 90 | Fill #2

## 2015-09-02 ENCOUNTER — Other Ambulatory Visit: Payer: Self-pay | Admitting: Physician Assistant

## 2015-09-02 MED FILL — ESCITALOPRAM 20 MG TABLET: 20 | 30 days supply | Qty: 30 | Fill #11

## 2015-09-03 MED FILL — CANDESARTAN CILEXETIL 32 MG: 32 | 90 days supply | Qty: 90 | Fill #0

## 2015-09-04 ENCOUNTER — Other Ambulatory Visit: Payer: Self-pay | Admitting: *Deleted

## 2015-09-04 DIAGNOSIS — H5203 Hypermetropia, bilateral: Secondary | ICD-10-CM | POA: Diagnosis not present

## 2015-09-04 DIAGNOSIS — H524 Presbyopia: Secondary | ICD-10-CM | POA: Diagnosis not present

## 2015-09-04 DIAGNOSIS — H52223 Regular astigmatism, bilateral: Secondary | ICD-10-CM | POA: Diagnosis not present

## 2015-09-04 DIAGNOSIS — H50011 Monocular esotropia, right eye: Secondary | ICD-10-CM | POA: Diagnosis not present

## 2015-09-04 DIAGNOSIS — E039 Hypothyroidism, unspecified: Secondary | ICD-10-CM

## 2015-09-04 MED ORDER — LEVOTHYROXINE SODIUM 137 MCG PO TABS
137.0000 ug | ORAL_TABLET | Freq: Every day | ORAL | Status: DC
Start: 2015-09-04 — End: 2015-12-01

## 2015-09-04 MED FILL — SYNTHROID 137 MCG TABLET: 137 | 90 days supply | Qty: 90 | Fill #0

## 2015-09-12 ENCOUNTER — Ambulatory Visit (INDEPENDENT_AMBULATORY_CARE_PROVIDER_SITE_OTHER): Payer: 59 | Admitting: Physician Assistant

## 2015-09-12 ENCOUNTER — Encounter: Payer: Self-pay | Admitting: Physician Assistant

## 2015-09-12 VITALS — BP 130/76 | HR 64 | Temp 97.3°F | Resp 14 | Ht 60.5 in | Wt 243.4 lb

## 2015-09-12 DIAGNOSIS — I10 Essential (primary) hypertension: Secondary | ICD-10-CM | POA: Diagnosis not present

## 2015-09-12 DIAGNOSIS — E559 Vitamin D deficiency, unspecified: Secondary | ICD-10-CM | POA: Diagnosis not present

## 2015-09-12 DIAGNOSIS — R7303 Prediabetes: Secondary | ICD-10-CM | POA: Diagnosis not present

## 2015-09-12 DIAGNOSIS — E7800 Pure hypercholesterolemia, unspecified: Secondary | ICD-10-CM

## 2015-09-12 DIAGNOSIS — E039 Hypothyroidism, unspecified: Secondary | ICD-10-CM | POA: Diagnosis not present

## 2015-09-12 DIAGNOSIS — Z79899 Other long term (current) drug therapy: Secondary | ICD-10-CM

## 2015-09-12 LAB — CBC WITH DIFFERENTIAL/PLATELET
BASOS PCT: 1 %
Basophils Absolute: 99 cells/uL (ref 0–200)
EOS PCT: 5 %
Eosinophils Absolute: 495 cells/uL (ref 15–500)
HCT: 40.8 % (ref 35.0–45.0)
Hemoglobin: 13.3 g/dL (ref 11.7–15.5)
LYMPHS PCT: 28 %
Lymphs Abs: 2772 cells/uL (ref 850–3900)
MCH: 28.7 pg (ref 27.0–33.0)
MCHC: 32.6 g/dL (ref 32.0–36.0)
MCV: 88.1 fL (ref 80.0–100.0)
MONOS PCT: 6 %
MPV: 9.9 fL (ref 7.5–12.5)
Monocytes Absolute: 594 cells/uL (ref 200–950)
Neutro Abs: 5940 cells/uL (ref 1500–7800)
Neutrophils Relative %: 60 %
PLATELETS: 292 10*3/uL (ref 140–400)
RBC: 4.63 MIL/uL (ref 3.80–5.10)
RDW: 13.7 % (ref 11.0–15.0)
WBC: 9.9 10*3/uL (ref 3.8–10.8)

## 2015-09-12 LAB — TSH: TSH: 0.98 mIU/L

## 2015-09-12 MED ORDER — ESTRADIOL 1 MG PO TABS: ORAL_TABLET | ORAL | Status: DC

## 2015-09-12 MED ORDER — ESCITALOPRAM OXALATE 20 MG PO TABS: 20.0000 mg | ORAL_TABLET | Freq: Every day | ORAL | Status: DC

## 2015-09-12 NOTE — Progress Notes (Signed)
Assessment and Plan:  1. Hypertension -Continue medication, monitor blood pressure at home. Continue DASH diet.  Reminder to go to the ER if any CP, SOB, nausea, dizziness, severe HA, changes vision/speech, left arm numbness and tingling and jaw pain.  2. Cholesterol -Continue diet and exercise. Check cholesterol.   3. Prediabetes  -Continue diet and exercise. Check A1C  4. Vitamin D Def - check level and continue medications.   5. Morbid Obesity with co morbidities-  long discussion about weight loss, diet, and exercise  6. Hypothyroidism -check TSH level, continue medications the same, reminded to take on an empty stomach 30-86mins before food.   WANTS 90 DAY OF MEDICATIONS Continue diet and meds as discussed. Further disposition pending results of labs.Over 30 minutes of exam, counseling, chart review, and critical decision making was performed  HPI 55 y.o. female  presents for 3 month follow up on hypertension, cholesterol, prediabetes, and vitamin D deficiency.   Her blood pressure has been controlled at home, today their BP is BP: 130/76 mmHg  She does not workout. She denies chest pain, shortness of breath, dizziness.  She is on cholesterol medication, lipitor 20 1/2 pill daily and denies myalgias. Her cholesterol is at goal. The cholesterol last visit was:   Lab Results  Component Value Date   CHOL 139 06/13/2015   HDL 47 06/13/2015   LDLCALC 60 06/13/2015   TRIG 160* 06/13/2015   CHOLHDL 3.0 06/13/2015   She has been working on diet and exercise for prediabetes, and denies paresthesia of the feet, polydipsia, polyuria and visual disturbances.  Last A1C in the office was:  Lab Results  Component Value Date   HGBA1C 5.8* 06/13/2015  Patient is on Vitamin D supplement, 5000 IU dialy.    Lab Results  Component Value Date   VD25OH 48 06/13/2015   She is on thyroid medication. Her medication was not changed last visit, follows with Dr. Sharl Ma, but will start to monitor  here.     Lab Results  Component Value Date   TSH 0.63 06/13/2015   BMI is Body mass index is 46.74 kg/(m^2)., she is working on diet and exercise. Wt Readings from Last 3 Encounters:  09/12/15 243 lb 6.4 oz (110.406 kg)  06/13/15 242 lb 3.2 oz (109.861 kg)  01/10/15 246 lb (111.585 kg)     Current Medications:  Current Outpatient Prescriptions on File Prior to Visit  Medication Sig Dispense Refill  . albuterol (PROVENTIL HFA;VENTOLIN HFA) 108 (90 BASE) MCG/ACT inhaler Inhale 2 puffs into the lungs every 6 (six) hours as needed for wheezing or shortness of breath. 18 g 2  . aMILoride (MIDAMOR) 5 MG tablet TAKE 1 TABLET BY MOUTH EVERY MORNING. 90 tablet 0  . Ascorbic Acid (VITAMIN C) 1000 MG tablet Take 1,000 mg by mouth daily.    Marland Kitchen aspirin 325 MG EC tablet Take 325 mg by mouth every morning.     Marland Kitchen atorvastatin (LIPITOR) 20 MG tablet TAKE 1 TABLET BY MOUTH ONCE DAILY 90 tablet PRN  . candesartan (ATACAND) 32 MG tablet TAKE 1/2 TO 1 TABLET BY MOUTH EVERY NIGHT AS DIRECTED FOR BLOOD PRESSURE 90 tablet PRN  . cetirizine (ZYRTEC) 10 MG tablet Take 10 mg by mouth daily.    . Cholecalciferol (VITAMIN D-3) 5000 UNITS TABS Take 5,000 Units by mouth daily.    . cyanocobalamin 2000 MCG tablet Take 2,000 mcg by mouth daily.    Marland Kitchen EPINEPHRINE 0.3 mg/0.3 mL IJ SOAJ injection USE AS DIRECTED  2 Device PRN  . escitalopram (LEXAPRO) 20 MG tablet TAKE 1 TABLET BY MOUTH ONCE DAILY 30 tablet PRN  . esomeprazole (NEXIUM) 40 MG capsule TAKE 1 CAPSULE BY MOUTH DAILY. 90 capsule 3  . estradiol (ESTRACE) 1 MG tablet TAKE 1/2-1 TABLET BY MOUTH DAILY FOR VASOMOTOR SYMPTOMS 30 tablet 11  . famciclovir (FAMVIR) 500 MG tablet Take 500 mg by mouth 3 (three) times daily. Take AD for fever blister    . fluconazole (DIFLUCAN) 150 MG tablet Take 1 tablet (150 mg total) by mouth daily. 1 tablet 3  . fluticasone (FLONASE) 50 MCG/ACT nasal spray Place 1 spray into both nostrils daily. 48 g 2  . Glucosamine-Chondroitin  (GLUCOSAMINE CHONDR COMPLEX PO) Take 1,500 mg by mouth. 1500mg /1200mg   Actual dose    . levothyroxine (SYNTHROID, LEVOTHROID) 137 MCG tablet Take 1 tablet (137 mcg total) by mouth daily before breakfast. 90 tablet 0  . metFORMIN (GLUCOPHAGE-XR) 500 MG 24 hr tablet TAKE 2 TABLETS BY MOUTH TWICE DAILY 360 tablet PRN  . propranolol (INDERAL) 80 MG tablet TAKE 1 TABLET BY MOUTH TWICE DAILY 180 tablet PRN  . traMADol (ULTRAM) 50 MG tablet Take 1 tablet (50 mg total) by mouth every 6 (six) hours as needed. 60 tablet 3   No current facility-administered medications on file prior to visit.   Medical History:  Past Medical History  Diagnosis Date  . Multinodular goiter   . High cholesterol   . Hypothyroidism   . GERD (gastroesophageal reflux disease)   . Hypertension    Allergies:  Allergies  Allergen Reactions  . Avelox [Moxifloxacin Hcl In Nacl] Hives    Thrush, throat might have closed   . Hydrochlorothiazide Itching    On hands and feet  . Other Itching    All "Cillins", hives alao  . Penicillins     Hives  . Prednisone     Chest pains  . Zostavax [Zoster Vaccine Live]   . Codeine Hives, Itching and Rash  . Levothyroxine Rash    Only to generic. Not to brand synthroid.   . Sulfa Antibiotics Itching and Rash    Hands and feet itch and turn red    Review of Systems:  Review of Systems  Constitutional: Negative.        + hot flashes  HENT: Negative.   Eyes: Negative.   Respiratory: Negative.   Cardiovascular: Positive for leg swelling. Negative for chest pain, palpitations, orthopnea, claudication and PND.  Gastrointestinal: Negative.   Genitourinary: Negative.   Musculoskeletal: Negative.   Skin: Negative.   Neurological: Negative.   Endo/Heme/Allergies: Negative.   Psychiatric/Behavioral: Negative.     Family history- Review and unchanged Social history- Review and unchanged Physical Exam: BP 130/76 mmHg  Pulse 64  Temp(Src) 97.3 F (36.3 C) (Temporal)  Resp 14   Ht 5' 0.5" (1.537 m)  Wt 243 lb 6.4 oz (110.406 kg)  BMI 46.74 kg/m2  SpO2 96% Wt Readings from Last 3 Encounters:  09/12/15 243 lb 6.4 oz (110.406 kg)  06/13/15 242 lb 3.2 oz (109.861 kg)  01/10/15 246 lb (111.585 kg)   General Appearance: Well nourished, in no apparent distress. Eyes: PERRLA, EOMs, conjunctiva no swelling or erythema Sinuses: No Frontal/maxillary tenderness ENT/Mouth: Ext aud canals clear, TMs without erythema, bulging. No erythema, swelling, or exudate on post pharynx.  Tonsils not swollen or erythematous. Hearing normal.  Neck: Supple, thyroid normal.  Respiratory: Respiratory effort normal, BS equal bilaterally without rales, rhonchi, wheezing or stridor.  Cardio:  RRR with no MRGs. Brisk peripheral pulses with 2+ edema.  Abdomen: Soft, + BS, obese,  Non tender, no guarding, rebound, hernias, masses. Lymphatics: Non tender without lymphadenopathy.  Musculoskeletal: Full ROM, 5/5 strength Skin: Warm, dry without rashes, lesions, ecchymosis.  Neuro: Cranial nerves intact. Normal muscle tone, no cerebellar symptoms. Psych: Awake and oriented X 3, normal affect, Insight and Judgment appropriate.    Quentin Mulling, PA-C  10:03 AM [facility]

## 2015-09-13 LAB — HEPATIC FUNCTION PANEL
ALK PHOS: 74 U/L (ref 33–130)
ALT: 15 U/L (ref 6–29)
AST: 15 U/L (ref 10–35)
Albumin: 3.9 g/dL (ref 3.6–5.1)
BILIRUBIN DIRECT: 0.1 mg/dL (ref <=0.2)
BILIRUBIN INDIRECT: 0.2 mg/dL (ref 0.2–1.2)
BILIRUBIN TOTAL: 0.3 mg/dL (ref 0.2–1.2)
Total Protein: 6.4 g/dL (ref 6.1–8.1)

## 2015-09-13 LAB — LIPID PANEL
CHOL/HDL RATIO: 2.5 ratio (ref <=5.0)
CHOLESTEROL: 132 mg/dL (ref 125–200)
HDL: 53 mg/dL (ref >=46)
LDL CALC: 50 mg/dL (ref <130)
TRIGLYCERIDES: 147 mg/dL (ref <150)
VLDL: 29 mg/dL (ref <30)

## 2015-09-13 LAB — BASIC METABOLIC PANEL WITH GFR
BUN: 13 mg/dL (ref 7–25)
CALCIUM: 9.4 mg/dL (ref 8.6–10.4)
CO2: 23 mmol/L (ref 20–31)
Chloride: 103 mmol/L (ref 98–110)
Creat: 0.67 mg/dL (ref 0.50–1.05)
Glucose, Bld: 94 mg/dL (ref 65–99)
POTASSIUM: 5.1 mmol/L (ref 3.5–5.3)
SODIUM: 141 mmol/L (ref 135–146)

## 2015-09-13 LAB — VITAMIN D 25 HYDROXY (VIT D DEFICIENCY, FRACTURES): Vit D, 25-Hydroxy: 47 ng/mL (ref 30–100)

## 2015-09-13 LAB — HEMOGLOBIN A1C
Hgb A1c MFr Bld: 5.9 % — ABNORMAL HIGH (ref <5.7)
Mean Plasma Glucose: 123 mg/dL

## 2015-09-13 LAB — MAGNESIUM: MAGNESIUM: 1.5 mg/dL (ref 1.5–2.5)

## 2015-09-15 MED FILL — ESOMEPRAZOLE MAG DR 40 MG C: 40 | 90 days supply | Qty: 90 | Fill #2

## 2015-09-15 MED FILL — FLUTICASONE PROP 50 MCG SPR: 50 | 90 days supply | Qty: 48 | Fill #1

## 2015-09-22 MED FILL — METFORMIN HCL ER 500 MG TAB: 500 | 90 days supply | Qty: 360 | Fill #1

## 2015-10-01 MED FILL — ESCITALOPRAM 20 MG TABLET: 20 | 90 days supply | Qty: 90 | Fill #0

## 2015-10-27 ENCOUNTER — Encounter: Payer: Self-pay | Admitting: *Deleted

## 2015-11-05 ENCOUNTER — Other Ambulatory Visit: Payer: Self-pay | Admitting: Physician Assistant

## 2015-11-05 MED FILL — PROPRANOLOL 80 MG TABLET: 80 | 90 days supply | Qty: 180 | Fill #0

## 2015-11-10 MED FILL — ESTRADIOL 1 MG TABLET: 1 | 90 days supply | Qty: 90 | Fill #5

## 2015-11-24 ENCOUNTER — Other Ambulatory Visit: Payer: Self-pay | Admitting: *Deleted

## 2015-11-24 MED ORDER — AMILORIDE HCL 5 MG PO TABS: 5.0000 mg | ORAL_TABLET | Freq: Every morning | ORAL | 0 refills | Status: DC

## 2015-11-24 MED FILL — CANDESARTAN CILEXETIL 32 MG: 32 | 90 days supply | Qty: 90 | Fill #1

## 2015-11-24 MED FILL — aMILoride HCL 5 MG TABS: 5 | 90 days supply | Qty: 90 | Fill #0

## 2015-12-01 ENCOUNTER — Other Ambulatory Visit: Payer: Self-pay

## 2015-12-01 MED ORDER — LEVOTHYROXINE SODIUM 137 MCG PO TABS: 137.0000 ug | ORAL_TABLET | Freq: Every day | ORAL | 0 refills | Status: DC

## 2015-12-01 MED FILL — SYNTHROID 137 MCG TABLET: 137 | 90 days supply | Qty: 90 | Fill #0

## 2015-12-15 MED FILL — ESOMEPRAZOLE MAG DR 40 MG C: 40 | 90 days supply | Qty: 90 | Fill #3

## 2015-12-30 MED FILL — METFORMIN HCL ER 500 MG TAB: 500 | 90 days supply | Qty: 360 | Fill #2

## 2015-12-30 MED FILL — ESCITALOPRAM 20 MG TABLET: 20 | 90 days supply | Qty: 90 | Fill #1

## 2016-01-13 ENCOUNTER — Encounter: Payer: Self-pay | Admitting: Physician Assistant

## 2016-01-13 MED FILL — FLUCONAZOLE 150 MG TABLET: 150 | 31 days supply | Qty: 2 | Fill #1

## 2016-02-02 MED FILL — PROPRANOLOL 80 MG TABLET: 80 | 90 days supply | Qty: 180 | Fill #1

## 2016-02-06 ENCOUNTER — Ambulatory Visit (INDEPENDENT_AMBULATORY_CARE_PROVIDER_SITE_OTHER): Payer: 59 | Admitting: Physician Assistant

## 2016-02-06 ENCOUNTER — Encounter: Payer: Self-pay | Admitting: Physician Assistant

## 2016-02-06 VITALS — BP 128/70 | HR 74 | Temp 98.1°F | Resp 16 | Ht 60.5 in | Wt 244.0 lb

## 2016-02-06 DIAGNOSIS — M79605 Pain in left leg: Secondary | ICD-10-CM

## 2016-02-06 DIAGNOSIS — M79671 Pain in right foot: Secondary | ICD-10-CM

## 2016-02-06 DIAGNOSIS — R7303 Prediabetes: Secondary | ICD-10-CM

## 2016-02-06 DIAGNOSIS — K828 Other specified diseases of gallbladder: Secondary | ICD-10-CM | POA: Diagnosis not present

## 2016-02-06 DIAGNOSIS — R300 Dysuria: Secondary | ICD-10-CM | POA: Diagnosis not present

## 2016-02-06 DIAGNOSIS — D649 Anemia, unspecified: Secondary | ICD-10-CM

## 2016-02-06 DIAGNOSIS — E559 Vitamin D deficiency, unspecified: Secondary | ICD-10-CM | POA: Diagnosis not present

## 2016-02-06 DIAGNOSIS — M79604 Pain in right leg: Secondary | ICD-10-CM

## 2016-02-06 DIAGNOSIS — N951 Menopausal and female climacteric states: Secondary | ICD-10-CM

## 2016-02-06 DIAGNOSIS — Z79899 Other long term (current) drug therapy: Secondary | ICD-10-CM | POA: Diagnosis not present

## 2016-02-06 DIAGNOSIS — R6889 Other general symptoms and signs: Secondary | ICD-10-CM

## 2016-02-06 DIAGNOSIS — I10 Essential (primary) hypertension: Secondary | ICD-10-CM

## 2016-02-06 DIAGNOSIS — R05 Cough: Secondary | ICD-10-CM | POA: Diagnosis not present

## 2016-02-06 DIAGNOSIS — K2100 Gastro-esophageal reflux disease with esophagitis, without bleeding: Secondary | ICD-10-CM

## 2016-02-06 DIAGNOSIS — Z0001 Encounter for general adult medical examination with abnormal findings: Secondary | ICD-10-CM

## 2016-02-06 DIAGNOSIS — E039 Hypothyroidism, unspecified: Secondary | ICD-10-CM

## 2016-02-06 DIAGNOSIS — Z136 Encounter for screening for cardiovascular disorders: Secondary | ICD-10-CM

## 2016-02-06 DIAGNOSIS — E7800 Pure hypercholesterolemia, unspecified: Secondary | ICD-10-CM | POA: Diagnosis not present

## 2016-02-06 DIAGNOSIS — M79672 Pain in left foot: Secondary | ICD-10-CM

## 2016-02-06 DIAGNOSIS — R053 Chronic cough: Secondary | ICD-10-CM

## 2016-02-06 LAB — URINALYSIS, ROUTINE W REFLEX MICROSCOPIC
Bilirubin Urine: NEGATIVE
Glucose, UA: NEGATIVE
Hgb urine dipstick: NEGATIVE
Ketones, ur: NEGATIVE
Leukocytes, UA: NEGATIVE
NITRITE: NEGATIVE
Protein, ur: NEGATIVE
SPECIFIC GRAVITY, URINE: 1.024 (ref 1.001–1.035)
pH: 5.5 (ref 5.0–8.0)

## 2016-02-06 LAB — CBC WITH DIFFERENTIAL/PLATELET
BASOS ABS: 115 {cells}/uL (ref 0–200)
Basophils Relative: 1 %
EOS PCT: 5 %
Eosinophils Absolute: 575 cells/uL — ABNORMAL HIGH (ref 15–500)
HCT: 42.2 % (ref 35.0–45.0)
Hemoglobin: 13.8 g/dL (ref 11.7–15.5)
Lymphocytes Relative: 28 %
Lymphs Abs: 3220 cells/uL (ref 850–3900)
MCH: 28.8 pg (ref 27.0–33.0)
MCHC: 32.7 g/dL (ref 32.0–36.0)
MCV: 87.9 fL (ref 80.0–100.0)
MONOS PCT: 6 %
MPV: 10.5 fL (ref 7.5–12.5)
Monocytes Absolute: 690 cells/uL (ref 200–950)
NEUTROS ABS: 6900 {cells}/uL (ref 1500–7800)
Neutrophils Relative %: 60 %
PLATELETS: 330 10*3/uL (ref 140–400)
RBC: 4.8 MIL/uL (ref 3.80–5.10)
RDW: 13.6 % (ref 11.0–15.0)
WBC: 11.5 10*3/uL — AB (ref 3.8–10.8)

## 2016-02-06 LAB — FERRITIN: FERRITIN: 109 ng/mL (ref 10–232)

## 2016-02-06 LAB — TSH: TSH: 1.63 m[IU]/L

## 2016-02-06 NOTE — Patient Instructions (Addendum)
Simple math prevails.    1st - exercise does not produce significant weight loss - at best one converts fat into muscle , "bulks up", loses inches, but usually stays "weight neutral"     2nd - think of your body weightas a check book: If you eat more calories than you burn up - you save money or gain weight .... Or if you spend more money than you put in the check book, ie burn up more calories than you eat, then you lose weight     3rd - if you walk or run 1 mile, you burn up 100 calories - you have to burn up 3,500 calories to lose 1 pound, ie you have to walk/run 35 miles to lose 1 measly pound. So if you want to lose 10 #, then you have to walk/run 350 miles, so.... clearly exercise is not the solution.     4. So if you consume 1,500 calories, then you have to burn up the equivalent of 15 miles to stay weight neutral - It also stands to reason that if you consume 1,500 cal/day and don't lose weight, then you must be burning up about 1,500 cals/day to stay weight neutral.     5. If you really want to lose weight, you must cut your calorie intake 300 calories /day and at that rate you should lose about 1 # every 3 days.   6. Please purchase Norma Sanchez's book(s) "The End of Dieting" & "Eat to Live" . It has some great concepts and recipes.    Measure out your water, I want AT LEAST 64 oz, but 80-100 oz is best for weight loss Eat 3 meals a day  Who Qualifies for Obesity Medications? Although everyone is hopeful for a fast and easy way to lose weight, nothing has been shown to replace a prudent, calorie-controlled diet along with behavior modification as a cornerstone for all obesity treatments.  The next tool that can be used to achieve weight-loss and health improvement is medication. Pharmacotherapy may be offered to individuals affected by obesity who have failed to achieve weight-loss through diet and exercise alone. Currently there are several drugs that are approved by the FDA for  weight-loss: phentermine products (Adipex-P or Suprenza)  lorcaserin HCI (Belviq)  phentermine- topiramate ER (Qsymia)  Bupropion; Naltrexone ER (Contrave)   Let's take a closer look at each of these medications and learn how they work:  Phentermine (Adipex-P or Suprenza) How does it work? Phentermine is a medication available by prescription that works on chemicals in the brain to decrease your appetite. It also has a mild stimulant component that adds extra energy. Phentermine is a pill that is taken once a day in the morning time. Tolerance to this medication can develop, so it can only be used for several months at a time. Common side effects are dry mouth, sleeplessness, constipation. Weight-loss: The average weight-loss is 4-5 percent of your weight after one-year. In a 200 pound person, this means about 10 pounds of weight-loss. Patients who receive phentermine can usually expect to see greater weight-loss than those who receive non-pharmacologic care, on average about 13 pounds difference over 12 weeks as reported in one study. Concerns: Due to its stimulant effect, a person's blood pressure and heart rate may increase when on this medication; therefore, you must be monitored closely by a physician who is experienced in prescribing this medication. It cannot be used in patients with some heart conditions (such as poorly  controlled blood pressure), glaucoma (increased pressure in your eye), stroke or overactive thyroid. There is some concern for abuse, but this is minimal if the medication is appropriately used as directed by a healthcare professional.  Lorcaserin (Belviq) How does it work? Lorcaserin was approved in June 2012 by the FDA and became commercially available in June 2013. It works by helping you feel full while eating less, and it works on the chemicals in your brain to help decrease your appetite. Weight-loss: In individuals who took the medication for one-year, it has  been shown to have an average of 7 percent weight-loss. In a 200 pound person, this would mean a 14 pound weight-loss. Blood sugar, cholesterol and blood pressure levels have also been shown to improve. Concerns: The most common side effects are headache, dizziness, fatigue, dry mouth, upper NUU:[id number] tract infection and nausea.  Response to therapy should be evaluated by week 12.  If a patient has not lost at least 5% of baseline body weigh  Phentermine-Topiramate ER (Qsymia) How does it work? This combination medication was approved by the FDA in July 2012. Topiramate is a medication used to treat seizures. It was found that a common side effect of this medication was weight-loss. Phentermine, as described in this brochure, helps to increase your energy and decrease your appetite. Weight-loss: Among individuals who took the highest does of Qsymia (15 mg phentermine and 92 mg of topiramate ER) for one-year, they achieved an average of 14.4 percent weight-loss. In a 200 pound person, a 14.4 percent weight-loss would mean a loss of 29 pounds. Cholesterol levels have also been shown to improve. Concerns: The most common side effects were dry mouth, constipation and pins-and-needle feeling in extremities. Qsymia should NOT be taken during pregnancy since Topiramate ER, a component of Qsymia, has been associated with an increased risk of birth defects.  Bupropion; Naltrexone ER (Contrave) How does it work? Works in two areas of your brain, hunger center and reward center to reduce hunger and cravings.  Weight loss In a 56 week trial patients lost more than 5% of their body weight.  Concerns Most common side effects are dry mouth, constipation or diarrhea, headache.  Please take it with a full glass of water and low fat meal.    Follow-up Visits: Patients are given the opportunity to revisit a topic or obtain more information on an area of interest during follow-up visits. The frequency of  and interval between follow-up visits is determined on a patient-by-patient basis. Frequent visits (every 3 to 4 weeks) are encouraged until initial weight-loss goals (5 to 10 percent of body weight) are achieved. At that point, less frequent visits are typically scheduled as needed for individual patients. However, since obesity is considered a chronic life-long problem for many individuals, periodic continual follow up is recommended.   Research has shown that weight-loss as low as 5 percent of initial body weight can lead to favorable improvements in blood pressure, cholesterol, glucose levels and insulin sensitivity. The risk of developing heart disease is reduced the most in patients who have impaired glucose tolerance, type 2 diabetes or high blood pressure.     We want weight loss that will last so you should lose 1-2 pounds a week.  THAT IS IT! Please pick THREE things a month to change. Once it is a habit check off the item. Then pick another three items off the list to become habits.  If you are already doing a habit on the list GREAT!  Cross that item off! o Don't drink your calories. Ie, alcohol, soda, fruit juice, and sweet tea.  o Drink more water. Drink a glass when you feel hungry or before each meal.  o Eat breakfast - Complex carb and protein (likeDannon light and fit yogurt, oatmeal, fruit, eggs, [place]key bacon). o Measure your cereal.  Eat no more than one cup a day. (ie [place]) o Eat an apple a day. o Add a vegetable a day. o Try a new vegetable a month. o Use Pam! Stop using oil or butter to cook. o Don't finish your plate or use smaller plates. o Share your dessert. o Eat sugar free Jello for dessert or frozen grapes. o Don't eat 2-3 hours before bed. o Switch to whole wheat bread, pasta, and brown rice. o Make healthier choices when you eat out. No fries! o Pick baked chicken, NOT fried. o Don't forget to SLOW DOWN when you eat. It is not going anywhere.  o Take the  stairs. o Park far away in the parking lot o [employer] Farm[employer] soup cans (or weights) for 10 minutes while watching TV. o Walk at work for 10 minutes during break. o Walk outside 1 time a week with your friend, kids, dog, or significant other. o Start a walking group at church. o Walk the mall as much as you can tolerate.  o Keep a food diary. o Weigh yourself daily. o Walk for 15 minutes 3 days per week. o Cook at home more often and eat out less.  If life happens and you go back to old habits, it is okay.  Just start over. You can do it!   If you experience chest pain, get short of breath, or tired during the exercise, please stop immediately and inform your doctor.

## 2016-02-06 NOTE — Progress Notes (Signed)
Complete Physical  Assessment and Plan: Essential hypertension - continue medications, DASH diet, exercise and monitor at home. Call if greater than 130/80.  - CBC with Differential/Platelet - BASIC METABOLIC PANEL WITH GFR - Hepatic function panel - Urinalysis, Routine w reflex microscopic ([facility]) - Microalbumin / creatinine urine ratio - EKG 12-Lead  Hypothyroidism, unspecified hypothyroidism type Hypothyroidism-check TSH level, continue medications the same, reminded to take on an empty stomach 30-7260mins before food.  - TSH  Prediabetes Discussed general issues about diabetes pathophysiology and management., Educational material distributed., Suggested low cholesterol diet., Encouraged aerobic exercise., Discussed foot care., Reminded to get yearly retinal exam. - Hemoglobin A1c - Insulin, fasting  Morbid obesity, unspecified obesity type (HCC) Obesity with co morbidities- long discussion about weight loss, diet, and exercise  High cholesterol -continue medications, check lipids, decrease fatty foods, increase activity.  - Lipid panel   Medication management - Magnesium   Hot flashes, menopausal Continue lexapro, estrogen, on bASA  Bilateral leg and foot pain Weight loss advised   Vitamin D deficiency - VITAMIN D 25 Hydroxy (Vit-D Deficiency, Fractures)  Gastroesophageal reflux disease with esophagitis Continue PPI/H2 blocker, diet discussed  Biliary dyskinesia Continue PPI/H2 blocker, diet discussed   Encounter for general adult medical examination with abnormal findings  Anemia, unspecified type -     Iron and TIBC -     Ferritin  Dysuria ? Infection, ICS, vaginal atrophy- will check urine, decrease sugars/artifical sugars, if not better follow up for pelvic exam. -     Urine culture     Discussed med's effects and SE's. Screening labs and tests as requested with regular follow-up as recommended.  HPI  55 y.o. female  presents for a complete  physical. She has been having some dysuria with frequency x 1 week, has taken azo, some improvement. No fever, chills, AB/back pain.   Her blood pressure has been controlled at home, today their BP is BP: 128/70 She does not workout due to knee pain. She denies chest pain, shortness of breath, dizziness.  She is on cholesterol medication, lipitor 20 1/2 pill daily and denies myalgias. Her cholesterol is at goal. The cholesterol last visit was:   Lab Results  Component Value Date   CHOL 132 09/12/2015   HDL 53 09/12/2015   LDLCALC 50 09/12/2015   TRIG 147 09/12/2015   CHOLHDL 2.5 09/12/2015    She has been working on diet and exercise for prediabetes, and denies paresthesia of the feet, polydipsia, polyuria and visual disturbances. Last A1C in the office was:  Lab Results  Component Value Date   HGBA1C 5.9 (H) 09/12/2015   Patient is on Vitamin D supplement, on 1000 IU daily Lab Results  Component Value Date   VD25OH 47 09/12/2015    She has hot flashes, is on lexapro, low dose estrogen and bASA.  She is on thyroid medication. Her medication was not changed last visit.   Lab Results  Component Value Date   TSH 0.98 09/12/2015  .  BMI is Body mass index is 46.87 kg/m., she is struggling with weight loss due to stress, time limitations. She only eats twice a day, works 3rd shift, and does not get enough water, she is down to one soda.  Wt Readings from Last 3 Encounters:  02/06/16 244 lb (110.7 kg)  09/12/15 243 lb 6.4 oz (110.4 kg)  06/13/15 242 lb 3.2 oz (109.9 kg)    Current Medications:  Current Outpatient Prescriptions on File Prior to  Visit  Medication Sig Dispense Refill  . albuterol (PROVENTIL HFA;VENTOLIN HFA) 108 (90 BASE) MCG/ACT inhaler Inhale 2 puffs into the lungs every 6 (six) hours as needed for wheezing or shortness of breath. 18 g 2  . aMILoride (MIDAMOR) 5 MG tablet Take 1 tablet (5 mg total) by mouth every morning. 90 tablet 0  . Ascorbic Acid (VITAMIN C)  1000 MG tablet Take 1,000 mg by mouth daily.    Marland Kitchen. aspirin 325 MG EC tablet Take 325 mg by mouth every morning.     Marland Kitchen. atorvastatin (LIPITOR) 20 MG tablet TAKE 1 TABLET BY MOUTH ONCE DAILY 90 tablet PRN  . candesartan (ATACAND) 32 MG tablet TAKE 1/2 TO 1 TABLET BY MOUTH EVERY NIGHT AS DIRECTED FOR BLOOD PRESSURE 90 tablet PRN  . cetirizine (ZYRTEC) 10 MG tablet Take 10 mg by mouth daily.    . Cholecalciferol (VITAMIN D-3) 5000 UNITS TABS Take 5,000 Units by mouth daily.    . cyanocobalamin 2000 MCG tablet Take 2,000 mcg by mouth daily.    Marland Kitchen. EPINEPHRINE 0.3 mg/0.3 mL IJ SOAJ injection USE AS DIRECTED 2 Device PRN  . escitalopram (LEXAPRO) 20 MG tablet Take 1 tablet (20 mg total) by mouth daily. 90 tablet 3  . esomeprazole (NEXIUM) 40 MG capsule TAKE 1 CAPSULE BY MOUTH DAILY. 90 capsule 3  . estradiol (ESTRACE) 1 MG tablet TAKE 1/2-1 TABLET BY MOUTH DAILY FOR VASOMOTOR SYMPTOMS 90 tablet 4  . famciclovir (FAMVIR) 500 MG tablet Take 500 mg by mouth 3 (three) times daily. Take AD for fever blister    . fluconazole (DIFLUCAN) 150 MG tablet Take 1 tablet (150 mg total) by mouth daily. 1 tablet 3  . fluticasone (FLONASE) 50 MCG/ACT nasal spray Place 1 spray into both nostrils daily. 48 g 2  . Glucosamine-Chondroitin (GLUCOSAMINE CHONDR COMPLEX PO) Take 1,500 mg by mouth. 1500mg /1200mg   Actual dose    . levothyroxine (SYNTHROID, LEVOTHROID) 137 MCG tablet Take 1 tablet (137 mcg total) by mouth daily before breakfast. 90 tablet 0  . metFORMIN (GLUCOPHAGE-XR) 500 MG 24 hr tablet TAKE 2 TABLETS BY MOUTH TWICE DAILY 360 tablet PRN  . propranolol (INDERAL) 80 MG tablet TAKE 1 TABLET BY MOUTH TWICE DAILY 180 tablet 1  . traMADol (ULTRAM) 50 MG tablet Take 1 tablet (50 mg total) by mouth every 6 (six) hours as needed. 60 tablet 3   No current facility-administered medications on file prior to visit.    Health Maintenance:   Immunization History  Administered Date(s) Administered  . DTaP 12/07/2012  .  Influenza-Unspecified 11/22/2012, 11/12/2014  . Pneumococcal Polysaccharide-23 02/22/2009  . Td 02/23/2000  . Tdap 12/07/2012  . Zoster 10/29/2010   Tetanus: 2014 Pneumovax: 2011 Flu vaccine: 2017 Prevnar 13: N/A Zostavax:2012  LMP: 2005 p. Hysterectomy, 1 ovary and cervix Pap: 11/2013,  + stress incontinence  MGM: 05/2015 category A, no SBE DEXA: N/A TB gold- negative Colonoscopy: 04/2014, Dr. Madilyn FiremanHayes.  EGD: N/A CT AB 2009 CXR 12/2012 US thyroid and BX 2013, Dr. Sharl MaKerr.  Last Dental Exam: Dr. Dan HumphreysWalker Last Eye Exam: Dr. Charise Killianotter   Patient Care Team: Lucky CowboyWilliam McKeown, MD as PCP - General (Internal Medicine) Dorena CookeyJohn Hayes, MD as Consulting Physician (Gastroenterology) Talmage CoinJeffrey Kerr, MD as Consulting Physician (Endocrinology)  Medical History:  Past Medical History:  Diagnosis Date  . GERD (gastroesophageal reflux disease)   . High cholesterol   . Hypertension   . Hypothyroidism   . Multinodular goiter    Allergies Allergies  Allergen Reactions  .  Avelox [Moxifloxacin Hcl In Nacl] Hives    Thrush, throat might have closed   . Hydrochlorothiazide Itching    On hands and feet  . Other Itching    All "Cillins", hives alao  . Penicillins     Hives  . Prednisone     Chest pains  . Zostavax [Zoster Vaccine Live]   . Codeine Hives, Itching and Rash  . Levothyroxine Rash    Only to generic. Not to brand synthroid.   . Sulfa Antibiotics Itching and Rash    Hands and feet itch and turn red    SURGICAL HISTORY She  has a past surgical history that includes Carpal tunnel release (yrs ago); Knee surgery (arthroscopic, 3 yrs ago); Tonsillectomy (age 33); Abdominal hysterectomy (8 yrs ago); Tubal ligation (yrs ago); radioactive iodine to thyroid' (march 2013); and Cholecystectomy (12/28/2011). FAMILY HISTORY Her family history includes Cancer in her mother; Heart attack in her father; Stroke in her father and mother. SOCIAL HISTORY She  reports that she has never smoked. She has  never used smokeless tobacco. She reports that she does not drink alcohol or use drugs.  Review of Systems  Constitutional: Negative.  Negative for chills and fever.  HENT: Negative.   Eyes: Negative.   Respiratory: Negative.  Negative for shortness of breath.   Cardiovascular: Negative.  Negative for chest pain.  Gastrointestinal: Negative.   Genitourinary: Positive for dysuria, frequency and urgency. Negative for flank pain and hematuria.  Musculoskeletal: Positive for joint pain. Negative for back pain, falls, myalgias and neck pain.  Skin: Positive for rash (under breast).  Neurological: Negative.   Endo/Heme/Allergies: Negative.   Psychiatric/Behavioral: Negative.  Negative for depression. The patient does not have insomnia.      Physical Exam: Estimated body mass index is 46.87 kg/m as calculated from the following:   Height as of this encounter: 5' 0.5" (1.537 m).   Weight as of this encounter: 244 lb (110.7 kg). BP 128/70   Pulse 74   Temp 98.1 F (36.7 C)   Resp 16   Ht 5' 0.5" (1.537 m)   Wt 244 lb (110.7 kg)   SpO2 98%   BMI 46.87 kg/m  General Appearance: Well nourished, in no apparent distress.  Eyes: PERRLA, EOMs, conjunctiva no swelling or erythema, normal fundi and vessels.  Sinuses: No Frontal/maxillary tenderness  ENT/Mouth: Ext aud canals clear, normal light reflex with TMs without erythema, bulging. Good dentition. No erythema, swelling, or exudate on post pharynx. Tonsils not swollen or erythematous. Hearing normal.  Neck: Supple, thyroid normal. No bruits  Respiratory: Respiratory effort normal, BS equal bilaterally without rales, rhonchi, wheezing or stridor.  Cardio: RRR without murmurs, rubs or gallops. Brisk peripheral pulses without edema.  Chest: symmetric, with normal excursions and percussion.  Breasts: Symmetric, without lumps, nipple discharge, retractions. + well circumscribed erythematous rash under bilateral breast with satellite lesions.   Abdomen: Soft, nontender, obese, no guarding, rebound, hernias, masses, or organomegaly. .  Lymphatics: Non tender without lymphadenopathy.  Genitourinary: defer Musculoskeletal: Full ROM all peripheral extremities,5/5 strength, and normal gait.  Skin: Warm, dry without rashes, lesions, ecchymosis. Neuro: Cranial nerves intact, reflexes equal bilaterally. Normal muscle tone, no cerebellar symptoms. Sensation intact.  Psych: Awake and oriented X 3, normal affect, Insight and Judgment appropriate.   EKG: WNL no changes. AORTA SCAN: defer   Quentin Mulling 9:31 AM [facility]

## 2016-02-07 LAB — BASIC METABOLIC PANEL WITH GFR
BUN: 19 mg/dL (ref 7–25)
CALCIUM: 10 mg/dL (ref 8.6–10.4)
CHLORIDE: 102 mmol/L (ref 98–110)
CO2: 22 mmol/L (ref 20–31)
CREATININE: 0.75 mg/dL (ref 0.50–1.05)
GFR, EST NON AFRICAN AMERICAN: 89 mL/min (ref >=60)
GFR, Est African American: >89 mL/min (ref >=60)
Glucose, Bld: 86 mg/dL (ref 65–99)
Potassium: 4.9 mmol/L (ref 3.5–5.3)
SODIUM: 138 mmol/L (ref 135–146)

## 2016-02-07 LAB — LIPID PANEL
CHOL/HDL RATIO: 3.2 ratio (ref <5.0)
CHOLESTEROL: 155 mg/dL (ref <200)
HDL: 48 mg/dL — AB (ref >50)
LDL Cholesterol: 63 mg/dL (ref <100)
Triglycerides: 220 mg/dL — ABNORMAL HIGH (ref <150)
VLDL: 44 mg/dL — ABNORMAL HIGH (ref <30)

## 2016-02-07 LAB — IRON AND TIBC
%SAT: 29 % (ref 11–50)
IRON: 95 ug/dL (ref 45–160)
TIBC: 329 ug/dL (ref 250–450)
UIBC: 234 ug/dL (ref 125–400)

## 2016-02-07 LAB — MAGNESIUM: Magnesium: 1.5 mg/dL (ref 1.5–2.5)

## 2016-02-07 LAB — HEMOGLOBIN A1C
Hgb A1c MFr Bld: 5.4 % (ref <5.7)
Mean Plasma Glucose: 108 mg/dL

## 2016-02-07 LAB — HEPATIC FUNCTION PANEL
ALBUMIN: 4.2 g/dL (ref 3.6–5.1)
ALK PHOS: 85 U/L (ref 33–130)
ALT: 15 U/L (ref 6–29)
AST: 16 U/L (ref 10–35)
BILIRUBIN TOTAL: 0.3 mg/dL (ref 0.2–1.2)
Bilirubin, Direct: 0.1 mg/dL (ref <=0.2)
Indirect Bilirubin: 0.2 mg/dL (ref 0.2–1.2)
Total Protein: 6.9 g/dL (ref 6.1–8.1)

## 2016-02-07 LAB — VITAMIN D 25 HYDROXY (VIT D DEFICIENCY, FRACTURES): Vit D, 25-Hydroxy: 53 ng/mL (ref 30–100)

## 2016-02-07 LAB — MICROALBUMIN / CREATININE URINE RATIO
CREATININE, URINE: 122 mg/dL (ref 20–320)
Microalb Creat Ratio: 5 mcg/mg creat (ref <30)
Microalb, Ur: 0.6 mg/dL

## 2016-02-08 LAB — URINE CULTURE

## 2016-02-09 ENCOUNTER — Other Ambulatory Visit: Payer: Self-pay | Admitting: Physician Assistant

## 2016-02-09 DIAGNOSIS — R300 Dysuria: Secondary | ICD-10-CM

## 2016-02-09 MED ORDER — CEFUROXIME AXETIL 250 MG PO TABS: 250.0000 mg | ORAL_TABLET | Freq: Two times a day (BID) | ORAL | 0 refills | Status: AC

## 2016-02-09 MED FILL — FLUCONAZOLE 150 MG TABLET: 150 | 15 days supply | Qty: 1 | Fill #2

## 2016-02-09 MED FILL — CEFUROXIME AXETIL 250 MG TA: 250 | 7 days supply | Qty: 14 | Fill #0

## 2016-02-09 MED FILL — ESTRADIOL 1 MG TABLET: 1 | 90 days supply | Qty: 90 | Fill #0

## 2016-02-19 ENCOUNTER — Encounter: Payer: Self-pay | Admitting: *Deleted

## 2016-02-24 ENCOUNTER — Other Ambulatory Visit: Payer: Self-pay | Admitting: Physician Assistant

## 2016-02-24 ENCOUNTER — Other Ambulatory Visit: Payer: Self-pay | Admitting: Internal Medicine

## 2016-02-24 MED FILL — ATORVASTATIN 20 MG TABLET: 20 | 90 days supply | Qty: 90 | Fill #0

## 2016-02-24 MED FILL — aMILoride HCL 5 MG TABS: 5 | 90 days supply | Qty: 90 | Fill #0

## 2016-03-02 ENCOUNTER — Other Ambulatory Visit: Payer: 59

## 2016-03-02 DIAGNOSIS — R300 Dysuria: Secondary | ICD-10-CM

## 2016-03-03 ENCOUNTER — Other Ambulatory Visit: Payer: Self-pay | Admitting: Physician Assistant

## 2016-03-03 LAB — URINALYSIS, ROUTINE W REFLEX MICROSCOPIC
Bilirubin Urine: NEGATIVE
Glucose, UA: NEGATIVE
HGB URINE DIPSTICK: NEGATIVE
Ketones, ur: NEGATIVE
LEUKOCYTES UA: NEGATIVE
NITRITE: NEGATIVE
PROTEIN: NEGATIVE
Specific Gravity, Urine: 1.011 (ref 1.001–1.035)
pH: 5.5 (ref 5.0–8.0)

## 2016-03-03 LAB — URINE CULTURE

## 2016-03-03 MED FILL — SYNTHROID 137 MCG TABLET: 137 | 90 days supply | Qty: 90 | Fill #0

## 2016-03-03 NOTE — Progress Notes (Unsigned)
Normal

## 2016-03-16 ENCOUNTER — Other Ambulatory Visit: Payer: Self-pay | Admitting: Physician Assistant

## 2016-03-16 MED FILL — ESOMEPRAZOLE MAG DR 40 MG C: 40 | 90 days supply | Qty: 90 | Fill #0

## 2016-03-23 MED FILL — METFORMIN HCL ER 500 MG TAB: 500 | 90 days supply | Qty: 360 | Fill #3

## 2016-03-30 MED FILL — ESCITALOPRAM 20 MG TABLET: 20 | 90 days supply | Qty: 90 | Fill #2

## 2016-04-02 ENCOUNTER — Encounter: Payer: Self-pay | Admitting: Physician Assistant

## 2016-04-02 ENCOUNTER — Other Ambulatory Visit: Payer: Self-pay | Admitting: Internal Medicine

## 2016-04-26 ENCOUNTER — Other Ambulatory Visit: Payer: Self-pay | Admitting: Internal Medicine

## 2016-04-26 MED FILL — PROPRANOLOL 80 MG TABLET: 80 | 90 days supply | Qty: 180 | Fill #0

## 2016-05-11 MED FILL — ESTRADIOL 1 MG TABLET: 1 | 90 days supply | Qty: 90 | Fill #1

## 2016-05-20 ENCOUNTER — Encounter: Payer: Self-pay | Admitting: Physician Assistant

## 2016-05-20 ENCOUNTER — Ambulatory Visit (INDEPENDENT_AMBULATORY_CARE_PROVIDER_SITE_OTHER): Payer: 59 | Admitting: Physician Assistant

## 2016-05-20 VITALS — BP 124/82 | HR 64 | Temp 97.3°F | Resp 16 | Ht 60.5 in | Wt 250.0 lb

## 2016-05-20 DIAGNOSIS — E039 Hypothyroidism, unspecified: Secondary | ICD-10-CM | POA: Diagnosis not present

## 2016-05-20 DIAGNOSIS — E559 Vitamin D deficiency, unspecified: Secondary | ICD-10-CM

## 2016-05-20 DIAGNOSIS — I10 Essential (primary) hypertension: Secondary | ICD-10-CM

## 2016-05-20 DIAGNOSIS — Z79899 Other long term (current) drug therapy: Secondary | ICD-10-CM

## 2016-05-20 DIAGNOSIS — E7800 Pure hypercholesterolemia, unspecified: Secondary | ICD-10-CM | POA: Diagnosis not present

## 2016-05-20 LAB — BASIC METABOLIC PANEL WITH GFR
BUN: 13 mg/dL (ref 7–25)
CO2: 23 mmol/L (ref 20–31)
Calcium: 9.4 mg/dL (ref 8.6–10.4)
Chloride: 101 mmol/L (ref 98–110)
Creat: 0.72 mg/dL (ref 0.50–1.05)
GLUCOSE: 88 mg/dL (ref 65–99)
Potassium: 4.6 mmol/L (ref 3.5–5.3)
Sodium: 138 mmol/L (ref 135–146)

## 2016-05-20 LAB — LIPID PANEL
CHOL/HDL RATIO: 2.7 ratio (ref <5.0)
Cholesterol: 150 mg/dL (ref <200)
HDL: 55 mg/dL (ref >50)
LDL CALC: 69 mg/dL (ref <100)
TRIGLYCERIDES: 129 mg/dL (ref <150)
VLDL: 26 mg/dL (ref <30)

## 2016-05-20 LAB — CBC WITH DIFFERENTIAL/PLATELET
BASOS PCT: 1 %
Basophils Absolute: 126 cells/uL (ref 0–200)
Eosinophils Absolute: 756 cells/uL — ABNORMAL HIGH (ref 15–500)
Eosinophils Relative: 6 %
HEMATOCRIT: 40.5 % (ref 35.0–45.0)
Hemoglobin: 13.4 g/dL (ref 11.7–15.5)
LYMPHS PCT: 28 %
Lymphs Abs: 3528 cells/uL (ref 850–3900)
MCH: 29.2 pg (ref 27.0–33.0)
MCHC: 33.1 g/dL (ref 32.0–36.0)
MCV: 88.2 fL (ref 80.0–100.0)
MONO ABS: 756 {cells}/uL (ref 200–950)
MONOS PCT: 6 %
MPV: 10.1 fL (ref 7.5–12.5)
NEUTROS PCT: 59 %
Neutro Abs: 7434 cells/uL (ref 1500–7800)
PLATELETS: 318 10*3/uL (ref 140–400)
RBC: 4.59 MIL/uL (ref 3.80–5.10)
RDW: 13.7 % (ref 11.0–15.0)
WBC: 12.6 10*3/uL — AB (ref 3.8–10.8)

## 2016-05-20 LAB — HEPATIC FUNCTION PANEL
ALT: 17 U/L (ref 6–29)
AST: 15 U/L (ref 10–35)
Albumin: 4.2 g/dL (ref 3.6–5.1)
Alkaline Phosphatase: 89 U/L (ref 33–130)
Bilirubin, Direct: 0.1 mg/dL (ref <=0.2)
Indirect Bilirubin: 0.3 mg/dL (ref 0.2–1.2)
TOTAL PROTEIN: 7 g/dL (ref 6.1–8.1)
Total Bilirubin: 0.4 mg/dL (ref 0.2–1.2)

## 2016-05-20 LAB — TSH: TSH: 2.96 mIU/L

## 2016-05-20 LAB — MAGNESIUM: Magnesium: 1.6 mg/dL (ref 1.5–2.5)

## 2016-05-20 MED ORDER — PHENTERMINE HCL 37.5 MG PO TABS: 37.5000 mg | ORAL_TABLET | Freq: Every day | ORAL | 2 refills | Status: DC

## 2016-05-20 NOTE — Progress Notes (Signed)
Assessment and Plan:   Hypertension -Continue medication, monitor blood pressure at home. Continue DASH diet.  Reminder to go to the ER if any CP, SOB, nausea, dizziness, severe HA, changes vision/speech, left arm numbness and tingling and jaw pain.   Cholesterol -Continue diet and exercise. Check cholesterol.   Prediabetes  -Continue diet and exercise. Check A1C  Vitamin D Def - check level and continue medications.    Morbid Obesity with co morbidities-  long discussion about weight loss, diet, and exercise Will start on phentermine, follow up 1 month  Hypothyroidism -check TSH level, continue medications the same, reminded to take on an empty stomach 30-41mins before food.   WANTS 90 DAY OF MEDICATIONS Continue diet and meds as discussed. Further disposition pending results of labs.Over 30 minutes of exam, counseling, chart review, and critical decision making was performed  HPI 56 y.o. female  presents for 3 month follow up on hypertension, cholesterol, prediabetes, and vitamin D deficiency.   Her blood pressure has been controlled at home, today their BP is BP: 124/82  She does not workout. She denies chest pain, shortness of breath, dizziness.  She is on cholesterol medication, lipitor 20 1/2 pill daily and denies myalgias. Her cholesterol is at goal. The cholesterol last visit was:   Lab Results  Component Value Date   CHOL 155 02/06/2016   HDL 48 (L) 02/06/2016   LDLCALC 63 02/06/2016   TRIG 220 (H) 02/06/2016   CHOLHDL 3.2 02/06/2016   She has been working on diet and exercise for prediabetes, and denies paresthesia of the feet, polydipsia, polyuria and visual disturbances.  Last A1C in the office was:  Lab Results  Component Value Date   HGBA1C 5.4 02/06/2016  Patient is on Vitamin D supplement, 5000 IU dailly.    Lab Results  Component Value Date   VD25OH 53 02/06/2016   She is on thyroid medication. Her medication was not changed last visit, follows with Dr.  Sharl Ma, but will start to monitor here.     Lab Results  Component Value Date   TSH 1.63 02/06/2016   BMI is Body mass index is 48.02 kg/m., she is working on diet and exercise. Wt Readings from Last 3 Encounters:  05/20/16 250 lb (113.4 kg)  02/06/16 244 lb (110.7 kg)  09/12/15 243 lb 6.4 oz (110.4 kg)     Current Medications:  Current Outpatient Prescriptions on File Prior to Visit  Medication Sig Dispense Refill  . albuterol (PROVENTIL HFA;VENTOLIN HFA) 108 (90 BASE) MCG/ACT inhaler Inhale 2 puffs into the lungs every 6 (six) hours as needed for wheezing or shortness of breath. 18 g 2  . aMILoride (MIDAMOR) 5 MG tablet TAKE 1 TABLET BY MOUTH EVERY MORNING. 90 tablet 2  . Ascorbic Acid (VITAMIN C) 1000 MG tablet Take 1,000 mg by mouth daily.    Marland Kitchen aspirin 325 MG EC tablet Take 325 mg by mouth every morning.     Marland Kitchen atorvastatin (LIPITOR) 20 MG tablet TAKE 1 TABLET BY MOUTH ONCE DAILY 90 tablet 3  . candesartan (ATACAND) 32 MG tablet TAKE 1/2 TO 1 TABLET BY MOUTH EVERY NIGHT AS DIRECTED FOR BLOOD PRESSURE 90 tablet PRN  . cetirizine (ZYRTEC) 10 MG tablet Take 10 mg by mouth daily.    . Cholecalciferol (VITAMIN D-3) 5000 UNITS TABS Take 5,000 Units by mouth daily.    . cyanocobalamin 2000 MCG tablet Take 2,000 mcg by mouth daily.    Marland Kitchen EPINEPHRINE 0.3 mg/0.3 mL IJ SOAJ  injection USE AS DIRECTED 2 Device PRN  . escitalopram (LEXAPRO) 20 MG tablet Take 1 tablet (20 mg total) by mouth daily. 90 tablet 3  . esomeprazole (NEXIUM) 40 MG capsule TAKE 1 CAPSULE BY MOUTH DAILY. 90 capsule 3  . estradiol (ESTRACE) 1 MG tablet TAKE 1/2-1 TABLET BY MOUTH DAILY FOR VASOMOTOR SYMPTOMS 90 tablet 4  . famciclovir (FAMVIR) 500 MG tablet Take 500 mg by mouth 3 (three) times daily. Take AD for fever blister    . fluconazole (DIFLUCAN) 150 MG tablet Take 1 tablet (150 mg total) by mouth daily. 1 tablet 3  . fluticasone (FLONASE) 50 MCG/ACT nasal spray Place 1 spray into both nostrils daily. 48 g 2  .  Glucosamine-Chondroitin (GLUCOSAMINE CHONDR COMPLEX PO) Take 1,500 mg by mouth. 1500mg /1200mg   Actual dose    . metFORMIN (GLUCOPHAGE-XR) 500 MG 24 hr tablet TAKE 2 TABLETS BY MOUTH TWICE DAILY 360 tablet PRN  . propranolol (INDERAL) 80 MG tablet TAKE 1 TABLET BY MOUTH TWICE DAILY 180 tablet 1  . SYNTHROID 137 MCG tablet TAKE 1 TABLET BY MOUTH ONCE DAILY BEFORE BREAKFAST. 90 tablet 0  . traMADol (ULTRAM) 50 MG tablet Take 1 tablet (50 mg total) by mouth every 6 (six) hours as needed. 60 tablet 3   No current facility-administered medications on file prior to visit.    Medical History:  Past Medical History:  Diagnosis Date  . GERD (gastroesophageal reflux disease)   . High cholesterol   . Hypertension   . Hypothyroidism   . Multinodular goiter    Allergies:  Allergies  Allergen Reactions  . Avelox [Moxifloxacin Hcl In Nacl] Hives    Thrush, throat might have closed   . Hydrochlorothiazide Itching    On hands and feet  . Other Itching    All "Cillins", hives alao  . Penicillins     Hives  . Prednisone     Chest pains  . Zostavax [Zoster Vaccine Live]   . Codeine Hives, Itching and Rash  . Levothyroxine Rash    Only to generic. Not to brand synthroid.   . Sulfa Antibiotics Itching and Rash    Hands and feet itch and turn red    Review of Systems:  Review of Systems  Constitutional: Negative.   HENT: Negative.   Eyes: Negative.   Respiratory: Negative.   Cardiovascular: Positive for leg swelling. Negative for chest pain, palpitations, orthopnea, claudication and PND.  Gastrointestinal: Negative.   Genitourinary: Negative.   Musculoskeletal: Positive for joint pain (bilateral knees).  Skin: Negative.   Neurological: Negative.   Endo/Heme/Allergies: Negative.   Psychiatric/Behavioral: Negative.     Family history- Review and unchanged Social history- Review and unchanged Physical Exam: BP 124/82   Pulse 64   Temp 97.3 F (36.3 C)   Resp 16   Ht 5' 0.5" (1.537  m)   Wt 250 lb (113.4 kg)   SpO2 97%   BMI 48.02 kg/m  Wt Readings from Last 3 Encounters:  05/20/16 250 lb (113.4 kg)  02/06/16 244 lb (110.7 kg)  09/12/15 243 lb 6.4 oz (110.4 kg)   General Appearance: Well nourished, in no apparent distress. Eyes: PERRLA, EOMs, conjunctiva no swelling or erythema Sinuses: No Frontal/maxillary tenderness ENT/Mouth: Ext aud canals clear, TMs without erythema, bulging. No erythema, swelling, or exudate on post pharynx.  Tonsils not swollen or erythematous. Hearing normal.  Neck: Supple, thyroid normal.  Respiratory: Respiratory effort normal, BS equal bilaterally without rales, rhonchi, wheezing or stridor.  Cardio:  RRR with no MRGs. Brisk peripheral pulses with 2+ edema.  Abdomen: Soft, + BS, obese,  Non tender, no guarding, rebound, hernias, masses. Lymphatics: Non tender without lymphadenopathy.  Musculoskeletal: Full ROM, 5/5 strength Skin: Warm, dry without rashes, lesions, ecchymosis.  Neuro: Cranial nerves intact. Normal muscle tone, no cerebellar symptoms. Psych: Awake and oriented X 3, normal affect, Insight and Judgment appropriate.    Quentin MullingAmanda Florie Carico, PA-C  8:44 AM [facility]

## 2016-05-20 NOTE — Patient Instructions (Addendum)
Your ears and sinuses are connected by the eustachian tube. When your sinuses are inflamed, this can close off the tube and cause fluid to collect in your middle ear. This can then cause dizziness, popping, clicking, ringing, and echoing in your ears. This is often NOT an infection and does NOT require antibiotics, it is caused by inflammation so the treatments help the inflammation. This can take a long time to get better so please be patient.  Here are things you can do to help with this: - Try the Flonase or Nasonex. Remember to spray each nostril twice towards the outer part of your eye.  Do not sniff but instead pinch your nose and tilt your head back to help the medicine get into your sinuses.  The best time to do this is at bedtime.Stop if you get blurred vision or nose bleeds.  -While drinking fluids, pinch and hold nose close and swallow, to help open eustachian tubes to drain fluid behind ear drums. -Please pick one of the over the counter allergy medications below and take it once daily for allergies.  It will also help with fluid behind ear drums. Claritin or loratadine cheapest but likely the weakest  Zyrtec or certizine at night because it can make you sleepy The strongest is allegra or fexafinadine  Cheapest at [employer], [employer], [employer] -can use decongestant over the counter, please do not use if you have high blood pressure or certain heart conditions.   if worsening HA, changes vision/speech, imbalance, weakness go to the ER  Who Qualifies for Obesity Medications? Although everyone is hopeful for a fast and easy way to lose weight, nothing has been shown to replace a prudent, calorie-controlled diet along with behavior modification as a cornerstone for all obesity treatments.  The next tool that can be used to achieve weight-loss and health improvement is medication. Pharmacotherapy may be offered to individuals affected by obesity who have failed to achieve weight-loss through diet and  exercise alone. Currently there are several drugs that are approved by the FDA for weight-loss: phentermine products (Adipex-P or Suprenza)  lorcaserin HCI (Belviq) phentermine- topiramate ER (Qsymia)  Bupropion; Naltrexone ER (Contrave)  Let's take a closer look at each of these medications and learn how they work:  Phentermine (Adipex-P or Suprenza) How does it work? Phentermine is a medication available by prescription that works on chemicals in the brain to decrease your appetite. It also has a mild stimulant component that adds extra energy. Phentermine is a pill that is taken once a day in the morning time. Tolerance to this medication can develop, so it can only be used for several months at a time. Common side effects are dry mouth, sleeplessness, constipation. Weight-loss: The average weight-loss is 4-5 percent of your weight after one-year. In a 200 pound person, this means about 10 pounds of weight-loss. Patients who receive phentermine can usually expect to see greater weight-loss than those who receive non-pharmacologic care, on average about 13 pounds difference over 12 weeks as reported in one study. Concerns: Due to its stimulant effect, a person's blood pressure and heart rate may increase when on this medication; therefore, you must be monitored closely by a physician who is experienced in prescribing this medication. It cannot be used in patients with some heart conditions (such as poorly controlled blood pressure), glaucoma (increased pressure in your eye), stroke or overactive thyroid. There is some concern for abuse, but this is minimal if the medication is appropriately used as directed by  a [employer]cl[employer].  Lorcaserin (Belviq) How does it work? Lorcaserin was approved in June 2012 by the FDA and became commercially available in June 2013. It works by helping you feel full while eating less, and it works on the chemicals in your brain to help decrease your  appetite. Weight-loss: In individuals who took the medication for one-year, it has been shown to have an average of 7 percent weight-loss. In a 200 pound person, this would mean a 14 pound weight-loss. Blood sugar, cholesterol and blood pressure levels have also been shown to improve. Concerns: The most common side effects are headache, dizziness, fatigue, dry mouth, upper WUJ:WJXBJYNW tract infection and nausea.  Response to therapy should be evaluated by week 12.  If a patient has not lost at least 5% of baseline body weigh  Phentermine-Topiramate ER (Qsymia) How does it work? This combination medication was approved by the FDA in July 2012. Topiramate is a medication used to treat seizures. It was found that a common side effect of this medication was weight-loss. Phentermine, as described in this brochure, helps to increase your energy and decrease your appetite. Weight-loss: Among individuals who took the highest does of Qsymia (15 mg phentermine and 92 mg of topiramate ER) for one-year, they achieved an average of 14.4 percent weight-loss. In a 200 pound person, a 14.4 percent weight-loss would mean a loss of 29 pounds. Cholesterol levels have also been shown to improve. Concerns: The most common side effects were dry mouth, constipation and pins-and-needle feeling in extremities. Qsymia should NOT be taken during pregnancy since Topiramate ER, a component of Qsymia, has been associated with an increased risk of birth defects.  Bupropion; Naltrexone ER (Contrave) How does it work? Works in two areas of your brain, hunger center and reward center to reduce hunger and cravings.  Weight loss In a 56 week trial patients lost more than 5% of their body weight.  Concerns Most common side effects are dry mouth, constipation or diarrhea, headache.  Please take it with a full glass of water and low fat meal.    Follow-up Visits: Patients are given the opportunity to revisit a topic or obtain  more information on an area of interest during follow-up visits. The frequency of and interval between follow-up visits is determined on a patient-by-patient basis. Frequent visits (every 3 to 4 weeks) are encouraged until initial weight-loss goals (5 to 10 percent of body weight) are achieved. At that point, less frequent visits are typically scheduled as needed for individual patients. However, since obesity is considered a chronic life-long problem for many individuals, periodic continual follow up is recommended.   Research has shown that weight-loss as low as 5 percent of initial body weight can lead to favorable improvements in blood pressure, cholesterol, glucose levels and insulin sensitivity. The risk of developing heart disease is reduced the most in patients who have impaired glucose tolerance, type 2 diabetes or high blood pressure.    Simple math prevails.    1st - exercise does not produce significant weight loss - at best one converts fat into muscle , "bulks up", loses inches, but usually stays "weight neutral"     2nd - think of your body weightas a check book: If you eat more calories than you burn up - you save money or gain weight .... Or if you spend more money than you put in the check book, ie burn up more calories than you eat, then you lose weight  3rd - if you walk or run 1 mile, you burn up 100 calories - you have to burn up 3,500 calories to lose 1 pound, ie you have to walk/run 35 miles to lose 1 measly pound. So if you want to lose 10 #, then you have to walk/run 350 miles, so.... clearly exercise is not the solution.     4. So if you consume 1,500 calories, then you have to burn up the equivalent of 15 miles to stay weight neutral - It also stands to reason that if you consume 1,500 cal/day and don't lose weight, then you must be burning up about 1,500 cals/day to stay weight neutral.     5. If you really want to lose weight, you must cut your calorie intake 300  calories /day and at that rate you should lose about 1 # every 3 days.   6. Please purchase Dr Francis DowseJoel Fuhrman's book(s) "The End of Dieting" & "Eat to Live" . It has some great concepts and recipes.    Phentermine  While taking the medication we may ask that you come into the office once a month or once every 2-3 months to monitor your weight, blood pressure, and heart rate. In addition we can help answer your questions about diet, exercise, and help you every step of the way with your weight loss journey. Sometime it is helpful if you bring in a food diary or use an app on your phone such as myfitnesspal to record your calorie intake, especially in the beginning.   You can start out on 1/3 to 1/2 a pill in the morning and if you are tolerating it well you can increase to one pill daily. I also have some patients that take 1/3 or 1/2 at lunch to help prevent night time eating.  This medication is cheapest CASH pay at [facility] OR [facility] OR [facility] [employer] is 14-17 dollars and you do NOT need a membership to get meds from there.    What is this medicine? PHENTERMINE (FEN ter meen) decreases your appetite. This medicine is intended to be used in addition to a healthy reduced calorie diet and exercise. The best results are achieved this way. This medicine is only indicated for short-term use. Eventually your weight loss may level out and the medication will no longer be needed.   How should I use this medicine? Take this medicine by mouth. Follow the directions on the prescription label. The tablets should stay in the bottle until immediately before you take your dose. Take your doses at regular intervals. Do not take your medicine more often than directed.  Overdosage: If you think you have taken too much of this medicine contact a poison control center or emergency room at once. NOTE: This medicine is only for you. Do not share this medicine with others.  What if I miss a dose? If you miss a dose, take  it as soon as you can. If it is almost time for your next dose, take only that dose. Do not take double or extra doses. Do not increase or in any way change your dose without consulting your doctor.  What should I watch for while using this medicine? Notify your physician immediately if you become short of breath while doing your normal activities. Do not take this medicine within 6 hours of bedtime. It can keep you from getting to sleep. Avoid drinks that contain caffeine and try to stick to a regular bedtime every night. Do  not stand or sit up quickly, especially if you are an older patient. This reduces the risk of dizzy or fainting spells. Avoid alcoholic drinks.  What side effects may I notice from receiving this medicine? Side effects that you should report to your doctor or health care professional as soon as possible: -chest pain, palpitations -depression or severe changes in mood -increased blood pressure -irritability -nervousness or restlessness -severe dizziness -shortness of breath -problems urinating -unusual swelling of the legs -vomiting  Side effects that usually do not require medical attention (report to your doctor or health care professional if they continue or are bothersome): -blurred vision or other eye problems -changes in sexual ability or desire -constipation or diarrhea -difficulty sleeping -dry mouth or unpleasant taste -headache -nausea This list may not describe all possible side effects. Call your doctor for medical advice about side effects. You may report side effects to FDA at 1-800-FDA-1088.

## 2016-05-26 MED FILL — aMILoride HCL 5 MG TABS: 5 | 90 days supply | Qty: 90 | Fill #1

## 2016-05-26 MED FILL — CANDESARTAN CILEXETIL 32 MG: 32 | 90 days supply | Qty: 90 | Fill #2

## 2016-06-01 ENCOUNTER — Other Ambulatory Visit: Payer: Self-pay | Admitting: Internal Medicine

## 2016-06-01 MED FILL — SYNTHROID 137 MCG TABLET: 137 | 90 days supply | Qty: 90 | Fill #0

## 2016-06-07 MED FILL — ESOMEPRAZOLE MAG DR 40 MG C: 40 | 90 days supply | Qty: 90 | Fill #1

## 2016-06-14 MED FILL — METFORMIN HCL ER 500 MG TAB: 500 | 90 days supply | Qty: 360 | Fill #4

## 2016-06-21 NOTE — Progress Notes (Signed)
Assessment and Plan: Morbid obesity (HCC) Continue phentermine, follow up 3 month General weight loss/lifestyle modification strategies discussed (elicit support from others; identify saboteurs; non-food rewards, etc). Continue food diary Continue restricted calorie diet Continue daily exercise as well as behavior modification such as walking further away, putting down the fork, having a plan, using stairs, etc.    Future Appointments Date Time Provider Department Center  02/08/2017 9:00 AM Quentin Mulling, PA-C [id number] None    HPI 56 y.o.female presents for 1 month follow up for phentermine initiation.  BMI is Body mass index is 46.87 kg/m., she is working on diet and exercise. She is getting 80 oz a day, cutting diet drinks down, she is down 6 lbs from last visit. She has dry mouth with it but no constipation, no other symptoms or side effects with it. Works 3rd shift.  Wt Readings from Last 3 Encounters:  06/22/16 244 lb (110.7 kg)  05/20/16 250 lb (113.4 kg)  02/06/16 244 lb (110.7 kg)   Blood pressure 130/76, pulse 74, temperature 97.3 F (36.3 C), resp. rate 16, height 5' 0.5" (1.537 m), weight 244 lb (110.7 kg), SpO2 96 %.   Past Medical History:  Diagnosis Date  . GERD (gastroesophageal reflux disease)   . High cholesterol   . Hypertension   . Hypothyroidism   . Multinodular goiter      Allergies  Allergen Reactions  . Avelox [Moxifloxacin Hcl In Nacl] Hives    Thrush, throat might have closed   . Hydrochlorothiazide Itching    On hands and feet  . Other Itching    All "Cillins", hives alao  . Penicillins     Hives  . Prednisone     Chest pains  . Zostavax [Zoster Vaccine Live]   . Codeine Hives, Itching and Rash  . Levothyroxine Rash    Only to generic. Not to brand synthroid.   . Sulfa Antibiotics Itching and Rash    Hands and feet itch and turn red    Current Outpatient Prescriptions on File Prior to Visit  Medication Sig  . albuterol  (PROVENTIL HFA;VENTOLIN HFA) 108 (90 BASE) MCG/ACT inhaler Inhale 2 puffs into the lungs every 6 (six) hours as needed for wheezing or shortness of breath.  Marland Kitchen aMILoride (MIDAMOR) 5 MG tablet TAKE 1 TABLET BY MOUTH EVERY MORNING.  . Ascorbic Acid (VITAMIN C) 1000 MG tablet Take 1,000 mg by mouth daily.  Marland Kitchen aspirin 325 MG EC tablet Take 325 mg by mouth every morning.   Marland Kitchen atorvastatin (LIPITOR) 20 MG tablet TAKE 1 TABLET BY MOUTH ONCE DAILY  . candesartan (ATACAND) 32 MG tablet TAKE 1/2 TO 1 TABLET BY MOUTH EVERY NIGHT AS DIRECTED FOR BLOOD PRESSURE  . cetirizine (ZYRTEC) 10 MG tablet Take 10 mg by mouth daily.  . Cholecalciferol (VITAMIN D-3) 5000 UNITS TABS Take 5,000 Units by mouth daily.  . cyanocobalamin 2000 MCG tablet Take 2,000 mcg by mouth daily.  Marland Kitchen EPINEPHRINE 0.3 mg/0.3 mL IJ SOAJ injection USE AS DIRECTED  . escitalopram (LEXAPRO) 20 MG tablet Take 1 tablet (20 mg total) by mouth daily.  Marland Kitchen esomeprazole (NEXIUM) 40 MG capsule TAKE 1 CAPSULE BY MOUTH DAILY.  Marland Kitchen estradiol (ESTRACE) 1 MG tablet TAKE 1/2-1 TABLET BY MOUTH DAILY FOR VASOMOTOR SYMPTOMS  . famciclovir (FAMVIR) 500 MG tablet Take 500 mg by mouth 3 (three) times daily. Take AD for fever blister  . fluticasone (FLONASE) 50 MCG/ACT nasal spray Place 1 spray into both nostrils daily.  Marland Kitchen  Glucosamine-Chondroitin (GLUCOSAMINE CHONDR COMPLEX PO) Take 1,500 mg by mouth. /1200mg   Actual dose  . metFORMIN (GLUCOPHAGE-XR) 500 MG 24 hr tablet TAKE 2 TABLETS BY MOUTH TWICE DAILY  . phentermine (ADIPEX-P) 37.5 MG tablet Take 1 tablet (37.5 mg total) by mouth daily before breakfast.  . propranolol (INDERAL) 80 MG tablet TAKE 1 TABLET BY MOUTH TWICE DAILY  . SYNTHROID 137 MCG tablet TAKE 1 TABLET BY MOUTH ONCE A DAY BEFORE BREAKFAST.  Marland Kitchen traMADol (ULTRAM) 50 MG tablet Take 1 tablet (50 mg total) by mouth every 6 (six) hours as needed.  . fluconazole (DIFLUCAN) 150 MG tablet Take 1 tablet (150 mg total) by mouth daily.   No current  facility-administered medications on file prior to visit.     ROS: all negative except above.   Physical Exam: Filed Weights   06/22/16 0900  Weight: 244 lb (110.7 kg)   BP 130/76   Pulse 74   Temp 97.3 F (36.3 C)   Resp 16   Ht 5' 0.5" (1.537 m)   Wt 244 lb (110.7 kg)   SpO2 96%   BMI 46.87 kg/m  General Appearance: Well nourished, in no apparent distress. Eyes: PERRLA, EOMs, conjunctiva no swelling or erythema Sinuses: No Frontal/maxillary tenderness ENT/Mouth: Ext aud canals clear, TMs without erythema, bulging. No erythema, swelling, or exudate on post pharynx.  Tonsils not swollen or erythematous. Hearing normal.  Neck: Supple, thyroid normal.  Respiratory: Respiratory effort normal, BS equal bilaterally without rales, rhonchi, wheezing or stridor.  Cardio: RRR with no MRGs. Brisk peripheral pulses without edema.  Abdomen: Soft, + BS.  Non tender, no guarding, rebound, hernias, masses. Lymphatics: Non tender without lymphadenopathy.  Musculoskeletal: Full ROM, 5/5 strength, normal gait.  Skin: Warm, dry without rashes, lesions, ecchymosis.  Neuro: Cranial nerves intact. Normal muscle tone, no cerebellar symptoms. Sensation intact.  Psych: Awake and oriented X 3, normal affect, Insight and Judgment appropriate.     Quentin Mulling, PA-C 9:16 AM [facility]

## 2016-06-22 ENCOUNTER — Ambulatory Visit (INDEPENDENT_AMBULATORY_CARE_PROVIDER_SITE_OTHER): Payer: 59 | Admitting: Physician Assistant

## 2016-06-22 ENCOUNTER — Encounter: Payer: Self-pay | Admitting: Physician Assistant

## 2016-06-22 VITALS — BP 130/76 | HR 74 | Temp 97.3°F | Resp 16 | Ht 60.5 in | Wt 244.0 lb

## 2016-06-22 DIAGNOSIS — I10 Essential (primary) hypertension: Secondary | ICD-10-CM

## 2016-06-22 DIAGNOSIS — E039 Hypothyroidism, unspecified: Secondary | ICD-10-CM

## 2016-06-22 MED ORDER — PHENTERMINE HCL 37.5 MG PO TABS: 37.5000 mg | ORAL_TABLET | Freq: Every day | ORAL | 2 refills | Status: DC

## 2016-06-22 NOTE — Patient Instructions (Signed)
Who Qualifies for Obesity Medications? Although everyone is hopeful for a fast and easy way to lose weight, nothing has been shown to replace a prudent, calorie-controlled diet along with behavior modification as a cornerstone for all obesity treatments.  The next tool that can be used to achieve weight-loss and health improvement is medication. Pharmacotherapy may be offered to individuals affected by obesity who have failed to achieve weight-loss through diet and exercise alone. Currently there are several drugs that are approved by the FDA for weight-loss: phentermine products (Adipex-P or Suprenza)  lorcaserin HCI (Belviq) phentermine- topiramate ER (Qsymia)  Bupropion; Naltrexone ER (Contrave)  Let's take a closer look at each of these medications and learn how they work:  Phentermine (Adipex-P or Suprenza) How does it work? Phentermine is a medication available by prescription that works on chemicals in the brain to decrease your appetite. It also has a mild stimulant component that adds extra energy. Phentermine is a pill that is taken once a day in the morning time. Tolerance to this medication can develop, so it can only be used for several months at a time. Common side effects are dry mouth, sleeplessness, constipation. Weight-loss: The average weight-loss is 4-5 percent of your weight after one-year. In a 200 pound person, this means about 10 pounds of weight-loss. Patients who receive phentermine can usually expect to see greater weight-loss than those who receive non-pharmacologic care, on average about 13 pounds difference over 12 weeks as reported in one study. Concerns: Due to its stimulant effect, a person's blood pressure and heart rate may increase when on this medication; therefore, you must be monitored closely by a physician who is experienced in prescribing this medication. It cannot be used in patients with some heart conditions (such as poorly controlled blood pressure),  glaucoma (increased pressure in your eye), stroke or overactive thyroid. There is some concern for abuse, but this is minimal if the medication is appropriately used as directed by a healthcare professional.  Lorcaserin (Belviq) How does it work? Lorcaserin was approved in June 2012 by the FDA and became commercially available in June 2013. It works by helping you feel full while eating less, and it works on the chemicals in your brain to help decrease your appetite. Weight-loss: In individuals who took the medication for one-year, it has been shown to have an average of 7 percent weight-loss. In a 200 pound person, this would mean a 14 pound weight-loss. Blood sugar, cholesterol and blood pressure levels have also been shown to improve. Concerns: The most common side effects are headache, dizziness, fatigue, dry mouth, upper JGG:EZMOQHUT tract infection and nausea.  Response to therapy should be evaluated by week 12.  If a patient has not lost at least 5% of baseline body weigh  Phentermine-Topiramate ER (Qsymia) How does it work? This combination medication was approved by the FDA in July 2012. Topiramate is a medication used to treat seizures. It was found that a common side effect of this medication was weight-loss. Phentermine, as described in this brochure, helps to increase your energy and decrease your appetite. Weight-loss: Among individuals who took the highest does of Qsymia (15 mg phentermine and 92 mg of topiramate ER) for one-year, they achieved an average of 14.4 percent weight-loss. In a 200 pound person, a 14.4 percent weight-loss would mean a loss of 29 pounds. Cholesterol levels have also been shown to improve. Concerns: The most common side effects were dry mouth, constipation and pins-and-needle feeling in extremities. Qsymia should NOT  be taken during pregnancy since Topiramate ER, a component of Qsymia, has been associated with an increased risk of birth  defects.  Bupropion; Naltrexone ER (Contrave) How does it work? Works in two areas of your brain, hunger center and reward center to reduce hunger and cravings.  Weight loss In a 56 week trial patients lost more than 5% of their body weight.  Concerns Most common side effects are dry mouth, constipation or diarrhea, headache.  Please take it with a full glass of water and low fat meal.    Follow-up Visits: Patients are given the opportunity to revisit a topic or obtain more information on an area of interest during follow-up visits. The frequency of and interval between follow-up visits is determined on a patient-by-patient basis. Frequent visits (every 3 to 4 weeks) are encouraged until initial weight-loss goals (5 to 10 percent of body weight) are achieved. At that point, less frequent visits are typically scheduled as needed for individual patients. However, since obesity is considered a chronic life-long problem for many individuals, periodic continual follow up is recommended.   Research has shown that weight-loss as low as 5 percent of initial body weight can lead to favorable improvements in blood pressure, cholesterol, glucose levels and insulin sensitivity. The risk of developing heart disease is reduced the most in patients who have impaired glucose tolerance, type 2 diabetes or high blood pressure.    Simple math prevails.    1st - exercise does not produce significant weight loss - at best one converts fat into muscle , "bulks up", loses inches, but usually stays "weight neutral"     2nd - think of your body weightas a check book: If you eat more calories than you burn up - you save money or gain weight .... Or if you spend more money than you put in the check book, ie burn up more calories than you eat, then you lose weight     3rd - if you walk or run 1 mile, you burn up 100 calories - you have to burn up 3,500 calories to lose 1 pound, ie you have to walk/run 35 miles to lose  1 measly pound. So if you want to lose 10 #, then you have to walk/run 350 miles, so.... clearly exercise is not the solution.     4. So if you consume 1,500 calories, then you have to burn up the equivalent of 15 miles to stay weight neutral - It also stands to reason that if you consume 1,500 cal/day and don't lose weight, then you must be burning up about 1,500 cals/day to stay weight neutral.     5. If you really want to lose weight, you must cut your calorie intake 300 calories /day and at that rate you should lose about 1 # every 3 days.   6. Please purchase Dr Francis Dowse Fuhrman's book(s) "The End of Dieting" & "Eat to Live" . It has some great concepts and recipes.     We want weight loss that will last so you should lose 1-2 pounds a week.  THAT IS IT! Please pick THREE things a month to change. Once it is a habit check off the item. Then pick another three items off the list to become habits.  If you are already doing a habit on the list GREAT!  Cross that item off! o Don't drink your calories. Ie, alcohol, soda, fruit juice, and sweet tea.  o Drink more water. Drink a glass  when you feel hungry or before each meal.  o Eat breakfast - Complex carb and protein (likeDannon light and fit yogurt, oatmeal, fruit, eggs, [place] bacon). o Measure your cereal.  Eat no more than one cup a day. (ie [place]) o Eat an apple a day. o Add a vegetable a day. o Try a new vegetable a month. o Use Pam! Stop using oil or butter to cook. o Don't finish your plate or use smaller plates. o Share your dessert. o Eat sugar free Jello for dessert or frozen grapes. o Don't eat 2-3 hours before bed. o Switch to whole wheat bread, pasta, and brown rice. o Make healthier choices when you eat out. No fries! o Pick baked chicken, NOT fried. o Don't forget to SLOW DOWN when you eat. It is not going anywhere.  o Take the stairs. o Park far away in the parking lot o [employer] (or weights) for 10 minutes while  watching TV. o Walk at work for 10 minutes during break. o Walk outside 1 time a week with your friend, kids, dog, or significant other. o Start a walking group at church. o Walk the mall as much as you can tolerate.  o Keep a food diary. o Weigh yourself daily. o Walk for 15 minutes 3 days per week. o Cook at home more often and eat out less.  If life happens and you go back to old habits, it is okay.  Just start over. You can do it!   If you experience chest pain, get short of breath, or tired during the exercise, please stop immediately and inform your doctor.   Before you even begin to attack a weight-loss plan, it pays to remember this: You are not fat. You have fat. Losing weight isn't about blame or shame; it's simply another achievement to accomplish. Dieting is like any other skill-you have to buckle down and work at it. As long as you act in a smart, reasonable way, you'll ultimately get where you want to be. Here are some weight loss pearls for you.  1. It's Not a Diet. It's a Lifestyle Thinking of a diet as something you're on and suffering through only for the short term doesn't work. To shed weight and keep it off, you need to make permanent changes to the way you eat. It's OK to indulge occasionally, of course, but if you cut calories temporarily and then revert to your old way of eating, you'll gain back the weight quicker than you can say yo-yo. Use it to lose it. Research shows that one of the best predictors of long-term weight loss is how many pounds you drop in the first month. For that reason, nutritionists often suggest being stricter for the first two weeks of your new eating strategy to build momentum. Cut out added sugar and alcohol and avoid unrefined carbs. After that, figure out how you can reincorporate them in a way that's healthy and maintainable.  2. There's a Right Way to Exercise Working out burns calories and fat and boosts your metabolism by building muscle. But  those trying to lose weight are notorious for overestimating the number of calories they burn and underestimating the amount they take in. Unfortunately, your system is biologically programmed to hold on to extra pounds and that means when you start exercising, your body senses the deficit and ramps up its hunger signals. If you're not diligent, you'll eat everything you burn and then some. Use it to  lose it. Cardio gets all the exercise glory, but strength and interval training are the real heroes. They help you build lean muscle, which in turn increases your metabolism and calorie-burning ability 3. Don't Overreact to Mild Hunger Some people have a hard time losing weight because of hunger anxiety. To them, being hungry is bad-something to be avoided at all costs-so they carry snacks with them and eat when they don't need to. Others eat because they're stressed out or bored. While you never want to get to the point of being ravenous (that's when bingeing is likely to happen), a hunger pang, a craving, or the fact that it's 3:00 p.m. should not send you racing for the vending machine or obsessing about the energy bar in your purse. Ideally, you should put off eating until your stomach is growling and it's difficult to concentrate.  Use it to lose it. When you feel the urge to eat, use the HALT method. Ask yourself, Am I really hungry? Or am I angry or anxious, lonely or bored, or tired? If you're still not certain, try the apple test. If you're truly hungry, an apple should seem delicious; if it doesn't, something else is going on. Or you can try drinking water and making yourself busy, if you are still hungry try a healthy snack.  4. Not All Calories Are Created Equal The mechanics of weight loss are pretty simple: Take in fewer calories than you use for energy. But the kind of food you eat makes all the difference. Processed food that's high in saturated fat and refined starch or sugar can cause  inflammation that disrupts the hormone signals that tell your brain you're full. The result: You eat a lot more.  Use it to lose it. Clean up your diet. Swap in whole, unprocessed foods, including vegetables, lean protein, and healthy fats that will fill you up and give you the biggest nutritional bang for your calorie buck. In a few weeks, as your brain starts receiving regular hunger and fullness signals once again, you'll notice that you feel less hungry overall and naturally start cutting back on the amount you eat.  5. Protein, Produce, and Plant-Based Fats Are Your Weight-Loss Trinity Here's why eating the three Ps regularly will help you drop pounds. Protein fills you up. You need it to build lean muscle, which keeps your metabolism humming so that you can torch more fat. People in a weight-loss program who ate double the recommended daily allowance for protein (about 110 grams for a 150-pound woman) lost 70 percent of their weight from fat, while people who ate the RDA lost only about 40 percent, one study found. Produce is packed with filling fiber. "It's very difficult to consume too many calories if you're eating a lot of vegetables. Example: Three cups of broccoli is a lot of food, yet only 93 calories. (Fruit is another story. It can be easy to overeat and can contain a lot of calories from sugar, so be sure to monitor your intake.) Plant-based fats like olive oil and those in avocados and nuts are healthy and extra satiating.  Use it to lose it. Aim to incorporate each of the three Ps into every meal and snack. People who eat protein throughout the day are able to keep weight off, according to a study in the American Journal of Clinical Nutrition. In addition to meat, poultry and seafood, good sources are beans, lentils, eggs, tofu, and yogurt. As for fat, keep portion sizes in  check by measuring out salad dressing, oil, and nut butters (shoot for one to two tablespoons). Finally, eat veggies  or a little fruit at every meal. People who did that consumed 308 fewer calories but didn't feel any hungrier than when they didn't eat more produce.  7. How You Eat Is As Important As What You Eat In order for your brain to register that you're full, you need to focus on what you're eating. Sit down whenever you eat, preferably at a table. Turn off the TV or computer, put down your phone, and look at your food. Smell it. Chew slowly, and don't put another bite on your fork until you swallow. When women ate lunch this attentively, they consumed 30 percent less when snacking later than those who listened to an audiobook at lunchtime, according to a study in the [place] Journal of Nutrition. 8. Weighing Yourself Really Works The scale provides the best evidence about whether your efforts are paying off. Seeing the numbers tick up or down or stagnate is motivation to keep going-or to rethink your approach. A 2015 study at [facility] found that daily weigh-ins helped people lose more weight, keep it off, and maintain that loss, even after two years. Use it to lose it. Step on the scale at the same time every day for the best results. If your weight shoots up several pounds from one weigh-in to the next, don't freak out. Eating a lot of salt the night before or having your period is the likely culprit. The number should return to normal in a day or two. It's a steady climb that you need to do something about. 9. Too Much Stress and Too Little Sleep Are Your Enemies When you're tired and frazzled, your body cranks up the production of cortisol, the stress hormone that can cause carb cravings. Not getting enough sleep also boosts your levels of ghrelin, a hormone associated with hunger, while suppressing leptin, a hormone that signals fullness and satiety. People on a diet who slept only five and a half hours a night for two weeks lost 55 percent less fat and were hungrier than those who slept eight and a  half hours, according to a study in the [place] [employer] Journal. Use it to lose it. Prioritize sleep, aiming for seven hours or more a night, which research shows helps lower stress. And make sure you're getting quality zzz's. If a snoring spouse or a fidgety cat wakes you up frequently throughout the night, you may end up getting the equivalent of just four hours of sleep, according to a study from [facility]. Keep pets out of the bedroom, and use a white-noise app to drown out snoring. 10. You Will Hit a plateau-And You Can Bust Through It As you slim down, your body releases much less leptin, the fullness hormone.  If you're not strength training, start right now. Building muscle can raise your metabolism to help you overcome a plateau. To keep your body challenged and burning calories, incorporate new moves and more intense intervals into your workouts or add another sweat session to your weekly routine. Alternatively, cut an extra 100 calories or so a day from your diet. Now that you've lost weight, your body simply doesn't need as much fuel.

## 2016-06-30 MED FILL — ESCITALOPRAM 20 MG TABLET: 20 | 90 days supply | Qty: 90 | Fill #3

## 2016-07-26 MED FILL — PROPRANOLOL 80 MG TABLET: 80 | 90 days supply | Qty: 180 | Fill #1

## 2016-08-11 MED FILL — ESTRADIOL 1 MG TABLET: 1 | 90 days supply | Qty: 90 | Fill #2

## 2016-08-23 MED FILL — aMILoride HCL 5 MG TABS: 5 | 90 days supply | Qty: 90 | Fill #2

## 2016-08-23 MED FILL — ATORVASTATIN 20 MG TABLET: 20 | 90 days supply | Qty: 90 | Fill #1

## 2016-08-30 ENCOUNTER — Other Ambulatory Visit: Payer: Self-pay | Admitting: Physician Assistant

## 2016-08-31 MED FILL — SYNTHROID 137 MCG TABLET: 137 | 90 days supply | Qty: 90 | Fill #0

## 2016-09-14 MED FILL — ESOMEPRAZOLE MAG DR 40 MG C: 40 | 90 days supply | Qty: 90 | Fill #2

## 2016-09-27 ENCOUNTER — Other Ambulatory Visit: Payer: Self-pay | Admitting: Internal Medicine

## 2016-09-27 ENCOUNTER — Other Ambulatory Visit: Payer: Self-pay | Admitting: Physician Assistant

## 2016-09-27 MED FILL — ESCITALOPRAM 20 MG TABLET: 20 | 90 days supply | Qty: 90 | Fill #0

## 2016-09-27 MED FILL — METFORMIN HCL ER 500 MG TAB: 500 | 90 days supply | Qty: 360 | Fill #0

## 2016-10-11 NOTE — Progress Notes (Signed)
Assessment and Plan:   Hypertension -Continue medication, monitor blood pressure at home. Continue DASH diet.  Reminder to go to the ER if any CP, SOB, nausea, dizziness, severe HA, changes vision/speech, left arm numbness and tingling and jaw pain.   Cholesterol -Continue diet and exercise. Check cholesterol at [facility]   Vitamin D Def - continue medications.    Morbid Obesity with co morbidities-  long discussion about weight loss, diet, and exercise - continue water  Hypothyroidism -check TSH level, continue medications the same, reminded to take on an empty stomach 30-60mins before food.   Stress incontinence No burning with urination, weight loss advised can do medication or PT  WANTS 90 DAY OF MEDICATIONS Will start to do every 4 months for visit.  Continue diet and meds as discussed. Further disposition pending results of labs.Over 30 minutes of exam, counseling, chart review, and critical decision making was performed Future Appointments Date Time Provider Department Center  02/08/2017 9:00 AM Quentin Mulling, PA-C [id number] None    HPI 56 y.o. female  presents for 3 month follow up on hypertension, cholesterol, prediabetes, and vitamin D deficiency.   Her blood pressure has been controlled at home, today their BP is BP: 126/74  She does not workout. She denies chest pain, shortness of breath, dizziness.  She is on cholesterol medication, lipitor 10 pill daily and denies myalgias. Her cholesterol is at goal. The cholesterol last visit was:   Lab Results  Component Value Date   CHOL 150 05/20/2016   HDL 55 05/20/2016   LDLCALC 69 05/20/2016   TRIG 129 05/20/2016   CHOLHDL 2.7 05/20/2016   She has been working on diet and exercise for prediabetes, and denies paresthesia of the feet, polydipsia, polyuria and visual disturbances.  Last A1C in the office was:  Lab Results  Component Value Date   HGBA1C 5.4 02/06/2016  Patient is on Vitamin D supplement, 5000 IU dailly.     Lab Results  Component Value Date   VD25OH 53 02/06/2016   She is on thyroid medication. Her medication was not changed last visit.  Lab Results  Component Value Date   TSH 2.96 05/20/2016   BMI is Body mass index is 45.72 kg/m., she is working on diet and exercise. She has not been on phentermine x 1 month. Has increased water and doing.  Wt Readings from Last 3 Encounters:  10/12/16 238 lb (108 kg)  06/22/16 244 lb (110.7 kg)  05/20/16 250 lb (113.4 kg)     Current Medications:  Current Outpatient Prescriptions on File Prior to Visit  Medication Sig Dispense Refill  . albuterol (PROVENTIL HFA;VENTOLIN HFA) 108 (90 BASE) MCG/ACT inhaler Inhale 2 puffs into the lungs every 6 (six) hours as needed for wheezing or shortness of breath. 18 g 2  . aMILoride (MIDAMOR) 5 MG tablet TAKE 1 TABLET BY MOUTH EVERY MORNING. 90 tablet 2  . Ascorbic Acid (VITAMIN C) 1000 MG tablet Take 1,000 mg by mouth daily.    Marland Kitchen aspirin 325 MG EC tablet Take 325 mg by mouth every morning.     Marland Kitchen atorvastatin (LIPITOR) 20 MG tablet TAKE 1 TABLET BY MOUTH ONCE DAILY 90 tablet 3  . candesartan (ATACAND) 32 MG tablet TAKE 1/2 TO 1 TABLET BY MOUTH EVERY NIGHT AS DIRECTED FOR BLOOD PRESSURE 90 tablet PRN  . cetirizine (ZYRTEC) 10 MG tablet Take 10 mg by mouth daily.    . Cholecalciferol (VITAMIN D-3) 5000 UNITS TABS Take 5,000 Units by mouth  daily.    . cyanocobalamin 2000 MCG tablet Take 2,000 mcg by mouth daily.    Marland Kitchen EPINEPHRINE 0.3 mg/0.3 mL IJ SOAJ injection USE AS DIRECTED 2 Device PRN  . escitalopram (LEXAPRO) 20 MG tablet TAKE 1 TABLET BY MOUTH ONCE DAILY 90 tablet 3  . esomeprazole (NEXIUM) 40 MG capsule TAKE 1 CAPSULE BY MOUTH DAILY. 90 capsule 3  . estradiol (ESTRACE) 1 MG tablet TAKE 1/2-1 TABLET BY MOUTH DAILY FOR VASOMOTOR SYMPTOMS 90 tablet 4  . famciclovir (FAMVIR) 500 MG tablet Take 500 mg by mouth 3 (three) times daily. Take AD for fever blister    . fluconazole (DIFLUCAN) 150 MG tablet Take 1  tablet (150 mg total) by mouth daily. 1 tablet 3  . fluticasone (FLONASE) 50 MCG/ACT nasal spray Place 1 spray into both nostrils daily. 48 g 2  . Glucosamine-Chondroitin (GLUCOSAMINE CHONDR COMPLEX PO) Take 1,500 mg by mouth. 1500mg /1200mg   Actual dose    . metFORMIN (GLUCOPHAGE-XR) 500 MG 24 hr tablet TAKE 2 TABLETS BY MOUTH TWICE DAILY 360 tablet PRN  . phentermine (ADIPEX-P) 37.5 MG tablet Take 1 tablet (37.5 mg total) by mouth daily before breakfast. 30 tablet 2  . propranolol (INDERAL) 80 MG tablet TAKE 1 TABLET BY MOUTH TWICE DAILY 180 tablet 1  . SYNTHROID 137 MCG tablet TAKE 1 TABLET BY MOUTH ONCE A DAY BEFORE BREAKFAST. 90 tablet 0  . traMADol (ULTRAM) 50 MG tablet Take 1 tablet (50 mg total) by mouth every 6 (six) hours as needed. 60 tablet 3   No current facility-administered medications on file prior to visit.    Medical History:  Past Medical History:  Diagnosis Date  . GERD (gastroesophageal reflux disease)   . High cholesterol   . Hypertension   . Hypothyroidism   . Multinodular goiter    Allergies:  Allergies  Allergen Reactions  . Avelox [Moxifloxacin Hcl In Nacl] Hives    Thrush, throat might have closed   . Hydrochlorothiazide Itching    On hands and feet  . Other Itching    All "Cillins", hives alao  . Penicillins     Hives  . Prednisone     Chest pains  . Zostavax [Zoster Vaccine Live]   . Codeine Hives, Itching and Rash  . Levothyroxine Rash    Only to generic. Not to brand synthroid.   . Sulfa Antibiotics Itching and Rash    Hands and feet itch and turn red    Review of Systems:  Review of Systems  Constitutional: Negative.   HENT: Negative.   Eyes: Negative.   Respiratory: Negative.   Cardiovascular: Negative for chest pain, palpitations, orthopnea, claudication, leg swelling and PND.  Gastrointestinal: Negative.   Genitourinary: Negative.   Musculoskeletal: Positive for joint pain (bilateral knees).  Skin: Negative.   Neurological:  Negative.   Endo/Heme/Allergies: Negative.   Psychiatric/Behavioral: Negative.     Family history- Review and unchanged Social history- Review and unchanged Physical Exam: BP 126/74   Pulse 63   Temp 97.6 F (36.4 C)   Resp 14   Ht 5' 0.5" (1.537 m)   Wt 238 lb (108 kg)   SpO2 98%   BMI 45.72 kg/m  Wt Readings from Last 3 Encounters:  10/12/16 238 lb (108 kg)  06/22/16 244 lb (110.7 kg)  05/20/16 250 lb (113.4 kg)   General Appearance: Well nourished, in no apparent distress. Eyes: PERRLA, EOMs, conjunctiva no swelling or erythema Sinuses: No Frontal/maxillary tenderness ENT/Mouth: Ext aud canals clear,  TMs without erythema, bulging. No erythema, swelling, or exudate on post pharynx.  Tonsils not swollen or erythematous. Hearing normal.  Neck: Supple, thyroid normal.  Respiratory: Respiratory effort normal, BS equal bilaterally without rales, rhonchi, wheezing or stridor.  Cardio: RRR with no MRGs. Brisk peripheral pulses with 2+ edema.  Abdomen: Soft, + BS, obese,  Non tender, no guarding, rebound, hernias, masses. Lymphatics: Non tender without lymphadenopathy.  Musculoskeletal: Full ROM, 5/5 strength Skin: Warm, dry without rashes, lesions, ecchymosis.  Neuro: Cranial nerves intact. Normal muscle tone, no cerebellar symptoms. Psych: Awake and oriented X 3, normal affect, Insight and Judgment appropriate.    Quentin Mulling, PA-C  8:54 AM [facility]

## 2016-10-12 ENCOUNTER — Ambulatory Visit (INDEPENDENT_AMBULATORY_CARE_PROVIDER_SITE_OTHER): Payer: 59 | Admitting: Physician Assistant

## 2016-10-12 ENCOUNTER — Encounter: Payer: Self-pay | Admitting: Physician Assistant

## 2016-10-12 VITALS — BP 126/74 | HR 63 | Temp 97.6°F | Resp 14 | Ht 60.5 in | Wt 238.0 lb

## 2016-10-12 DIAGNOSIS — R7303 Prediabetes: Secondary | ICD-10-CM | POA: Diagnosis not present

## 2016-10-12 DIAGNOSIS — E7800 Pure hypercholesterolemia, unspecified: Secondary | ICD-10-CM | POA: Diagnosis not present

## 2016-10-12 DIAGNOSIS — I10 Essential (primary) hypertension: Secondary | ICD-10-CM

## 2016-10-12 DIAGNOSIS — E039 Hypothyroidism, unspecified: Secondary | ICD-10-CM | POA: Diagnosis not present

## 2016-10-12 DIAGNOSIS — Z79899 Other long term (current) drug therapy: Secondary | ICD-10-CM | POA: Diagnosis not present

## 2016-10-12 MED ORDER — ATORVASTATIN CALCIUM 10 MG PO TABS: 10.0000 mg | ORAL_TABLET | Freq: Every day | ORAL | 1 refills | Status: DC

## 2016-10-12 NOTE — Patient Instructions (Signed)
Urinary Incontinence Urinary incontinence is the involuntary loss of urine from your bladder. What are the causes? There are many causes of urinary incontinence. They include:  Medicines.  Infections.  Prostatic enlargement, leading to overflow of urine from your bladder.  Surgery.  Neurological diseases.  Emotional factors.  What are the signs or symptoms? Urinary Incontinence can be divided into four types: 1. Urge incontinence. Urge incontinence is the involuntary loss of urine before you have the opportunity to go to the bathroom. There is a sudden urge to void but not enough time to reach a bathroom. 2. Stress incontinence. Stress incontinence is the sudden loss of urine with any activity that forces urine to pass. It is commonly caused by anatomical changes to the pelvis and sphincter areas of your body. 3. Overflow incontinence. Overflow incontinence is the loss of urine from an obstructed opening to your bladder. This results in a backup of urine and a resultant buildup of pressure within the bladder. When the pressure within the bladder exceeds the closing pressure of the sphincter, the urine overflows, which causes incontinence, similar to water overflowing a dam. 4. Total incontinence. Total incontinence is the loss of urine as a result of the inability to store urine within your bladder.  How is this diagnosed? Evaluating the cause of incontinence may require:  A thorough and complete medical and obstetric history.  A complete physical exam.  Laboratory tests such as a urine culture and sensitivities.  When additional tests are indicated, they can include:  An ultrasound exam.  Kidney and bladder X-rays.  Cystoscopy. This is an exam of the bladder using a narrow scope.  Urodynamic testing to test the nerve function to the bladder and sphincter areas.  How is this treated? Treatment for urinary incontinence depends on the cause:  For urge incontinence caused  by a bacterial infection, antibiotics will be prescribed. If the urge incontinence is related to medicines you take, your health care provider may have you change the medicine.  For stress incontinence, surgery to re-establish anatomical support to the bladder or sphincter, or both, will often correct the condition.  For overflow incontinence caused by an enlarged prostate, an operation to open the channel through the enlarged prostate will allow the flow of urine out of the bladder. In women with fibroids, a hysterectomy may be recommended.  For total incontinence, surgery on your urinary sphincter may help. An artificial urinary sphincter (an inflatable cuff placed around the urethra) may be required. In women who have developed a hole-like passage between their bladder and vagina (vesicovaginal fistula), surgery to close the fistula often is required.  Follow these instructions at home:  Normal daily hygiene and the use of pads or adult diapers that are changed regularly will help prevent odors and skin damage.  Avoid caffeine. It can overstimulate your bladder.  Use the bathroom regularly. Try about every 2-3 hours to go to the bathroom, even if you do not feel the need to do so. Take time to empty your bladder completely. After urinating, wait a minute. Then try to urinate again.  For causes involving nerve dysfunction, keep a log of the medicines you take and a journal of the times you go to the bathroom. Contact a health care provider if:  You experience worsening of pain instead of improvement in pain after your procedure.  Your incontinence becomes worse instead of better. Get help right away if:  You experience fever or shaking chills.  You are unable to   pass your urine.  You have redness spreading into your groin or down into your thighs. This information is not intended to replace advice given to you by your health care provider. Make sure you discuss any questions you have  with your health care provider. Document Released: 03/18/2004 Document Revised: 09/19/2015 Document Reviewed: 07/18/2012 Elsevier Interactive Patient Education  2018 [employer]  

## 2016-10-13 LAB — CBC WITH DIFFERENTIAL/PLATELET
BASOS ABS: 114 {cells}/uL (ref 0–200)
BASOS PCT: 0.8 %
EOS ABS: 795 {cells}/uL — AB (ref 15–500)
Eosinophils Relative: 5.6 %
HCT: 39.8 % (ref 35.0–45.0)
HEMOGLOBIN: 13.4 g/dL (ref 11.7–15.5)
Lymphs Abs: 4033 cells/uL — ABNORMAL HIGH (ref 850–3900)
MCH: 29.1 pg (ref 27.0–33.0)
MCHC: 33.7 g/dL (ref 32.0–36.0)
MCV: 86.3 fL (ref 80.0–100.0)
MONOS PCT: 5.4 %
MPV: 10.9 fL (ref 7.5–12.5)
NEUTROS ABS: 8492 {cells}/uL — AB (ref 1500–7800)
Neutrophils Relative %: 59.8 %
Platelets: 328 10*3/uL (ref 140–400)
RBC: 4.61 10*6/uL (ref 3.80–5.10)
RDW: 12.5 % (ref 11.0–15.0)
TOTAL LYMPHOCYTE: 28.4 %
WBC mixed population: 767 cells/uL (ref 200–950)
WBC: 14.2 10*3/uL — ABNORMAL HIGH (ref 3.8–10.8)

## 2016-10-13 LAB — BASIC METABOLIC PANEL WITH GFR
BUN: 17 mg/dL (ref 7–25)
CHLORIDE: 100 mmol/L (ref 98–110)
CO2: 26 mmol/L (ref 20–32)
Calcium: 10.3 mg/dL (ref 8.6–10.4)
Creat: 0.78 mg/dL (ref 0.50–1.05)
GFR, EST AFRICAN AMERICAN: 98 mL/min/{1.73_m2} (ref >=60)
GFR, Est Non African American: 85 mL/min/{1.73_m2} (ref >=60)
Glucose, Bld: 81 mg/dL (ref 65–99)
Potassium: 4.9 mmol/L (ref 3.5–5.3)
SODIUM: 137 mmol/L (ref 135–146)

## 2016-10-13 LAB — HEPATIC FUNCTION PANEL
AG RATIO: 1.6 (calc) (ref 1.0–2.5)
ALKALINE PHOSPHATASE (APISO): 83 U/L (ref 33–130)
ALT: 21 U/L (ref 6–29)
AST: 22 U/L (ref 10–35)
Albumin: 4.1 g/dL (ref 3.6–5.1)
BILIRUBIN DIRECT: 0.1 mg/dL (ref 0.0–0.2)
BILIRUBIN INDIRECT: 0.3 mg/dL (ref 0.2–1.2)
GLOBULIN: 2.6 g/dL (ref 1.9–3.7)
Total Bilirubin: 0.4 mg/dL (ref 0.2–1.2)
Total Protein: 6.7 g/dL (ref 6.1–8.1)

## 2016-10-13 LAB — TSH: TSH: 2.31 mIU/L (ref 0.40–4.50)

## 2016-11-01 ENCOUNTER — Other Ambulatory Visit: Payer: Self-pay | Admitting: Physician Assistant

## 2016-11-01 ENCOUNTER — Other Ambulatory Visit: Payer: Self-pay | Admitting: Internal Medicine

## 2016-11-02 ENCOUNTER — Other Ambulatory Visit: Payer: Self-pay | Admitting: Physician Assistant

## 2016-11-02 MED FILL — ESTRADIOL 1 MG TABLET: 1 | 90 days supply | Qty: 90 | Fill #0

## 2016-11-02 MED FILL — PROPRANOLOL HCL 80 MG TAB: 80 | 90 days supply | Qty: 180 | Fill #0

## 2016-11-22 ENCOUNTER — Other Ambulatory Visit: Payer: Self-pay | Admitting: Internal Medicine

## 2016-11-22 ENCOUNTER — Other Ambulatory Visit: Payer: Self-pay | Admitting: Physician Assistant

## 2016-11-22 MED FILL — CANDESARTAN CILEXETIL 32 MG: 32 | 90 days supply | Qty: 90 | Fill #0

## 2016-11-22 MED FILL — SYNTHROID 137 MCG TABLET: 137 | 90 days supply | Qty: 90 | Fill #0

## 2016-11-22 MED FILL — aMILoride HCL 5 MG TABS: 5 | 90 days supply | Qty: 90 | Fill #0

## 2016-11-25 DIAGNOSIS — H52223 Regular astigmatism, bilateral: Secondary | ICD-10-CM | POA: Diagnosis not present

## 2016-11-25 DIAGNOSIS — H524 Presbyopia: Secondary | ICD-10-CM | POA: Diagnosis not present

## 2016-11-25 DIAGNOSIS — H5203 Hypermetropia, bilateral: Secondary | ICD-10-CM | POA: Diagnosis not present

## 2016-12-15 MED FILL — ESOMEPRAZOLE MAG DR 40 MG C: 40 | 90 days supply | Qty: 90 | Fill #3

## 2016-12-20 MED FILL — METFORMIN HCL ER 500 MG TAB: 500 | 90 days supply | Qty: 360 | Fill #1

## 2016-12-20 MED FILL — ESCITALOPRAM 20 MG TABLET: 20 | 90 days supply | Qty: 90 | Fill #1

## 2017-01-24 MED FILL — ATORVASTATIN 20 MG TABLET: 20 | 90 days supply | Qty: 90 | Fill #2

## 2017-01-24 MED FILL — PROPRANOLOL HCL 80 MG TAB: 80 | 90 days supply | Qty: 180 | Fill #1

## 2017-02-07 NOTE — Progress Notes (Signed)
Complete Physical  Assessment and Plan: Essential hypertension - continue medications, DASH diet, exercise and monitor at home. Call if greater than 130/80.  - CBC with Differential/Platelet - BASIC METABOLIC PANEL WITH GFR - Hepatic function panel - Urinalysis, Routine w reflex microscopic ([facility]) - Microalbumin / creatinine urine ratio - EKG 12-Lead  Hypothyroidism, unspecified hypothyroidism type Hypothyroidism-check TSH level, continue medications the same, reminded to take on an empty stomach 30-4660mins before food.  - TSH  Morbid obesity, unspecified obesity type (HCC) Obesity with co morbidities- long discussion about weight loss, diet, and exercise -     phentermine (ADIPEX-P) 37.5 MG tablet; Take 1 tablet (37.5 mg total) by mouth daily before breakfast. - follow up 3 months for progress monitoring - increase veggies, decrease carbs - long discussion about weight loss, diet, and exercise  High cholesterol -continue medications, check lipids, decrease fatty foods, increase activity.  - Lipid panel   Medication management - Magnesium   Hot flashes, menopausal Continue lexapro, estrogen, on bASA  Bilateral leg and foot pain Weight loss advised   Vitamin D deficiency - VITAMIN D 25 Hydroxy (Vit-D Deficiency, Fractures)  Gastroesophageal reflux disease with esophagitis Continue PPI/H2 blocker, diet discussed  Biliary dyskinesia Continue PPI/H2 blocker, diet discussed   Encounter for general adult medical examination with abnormal findings  Anemia, unspecified type -     Iron and TIBC -     Ferritin  BMI 45.0-49.9, adult (HCC) -     phentermine (ADIPEX-P) 37.5 MG tablet; Take 1 tablet (37.5 mg total) by mouth daily before breakfast. - follow up 3 months for progress monitoring - increase veggies, decrease carbs - long discussion about weight loss, diet, and exercise  Screening, anemia, deficiency, iron -     Iron,Total/Total Iron Binding Cap -      Vitamin B12   Discussed med's effects and SE's. Screening labs and tests as requested with regular follow-up as recommended. Future Appointments  Date Time Provider Department Center  02/16/2018  9:00 AM Quentin Mullingollier, Neoma Uhrich, PA-C [id number] None    HPI  56 y.o. female  presents for a complete physical.    Her blood pressure has been controlled at home, today their BP is BP: 124/74 She does not workout due to knee pain. She denies chest pain, shortness of breath, dizziness.  She is on cholesterol medication, lipitor 20 1/2 pill daily and denies myalgias. Her cholesterol is at goal. The cholesterol last visit was:   Lab Results  Component Value Date   CHOL 150 05/20/2016   HDL 55 05/20/2016   LDLCALC 69 05/20/2016   TRIG 129 05/20/2016   CHOLHDL 2.7 05/20/2016    She has been working on diet and exercise for prediabetes, and denies paresthesia of the feet, polydipsia, polyuria and visual disturbances. Last A1C in the office was:  Lab Results  Component Value Date   HGBA1C 5.4 02/06/2016   Patient is on Vitamin D supplement, on 1000 IU daily Lab Results  Component Value Date   VD25OH 8053 02/06/2016    She has hot flashes, is on lexapro, low dose estrogen and bASA.  She is on thyroid medication. Her medication was not changed last visit.   Lab Results  Component Value Date   TSH 2.31 10/12/2016  .  BMI is Body mass index is 46.56 kg/m., she is struggling with weight loss due to stress, time limitations. She states she has been out of phentermine and has not been drinking enough water due to  stress at work. Works 3rd shift, gets up 330-400 and takes it then.  Wt Readings from Last 3 Encounters:  02/08/17 246 lb 6.4 oz (111.8 kg)  10/12/16 238 lb (108 kg)  06/22/16 244 lb (110.7 kg)    Current Medications:  Current Outpatient Medications on File Prior to Visit  Medication Sig Dispense Refill  . albuterol (PROVENTIL HFA;VENTOLIN HFA) 108 (90 BASE) MCG/ACT inhaler Inhale 2 puffs  into the lungs every 6 (six) hours as needed for wheezing or shortness of breath. 18 g 2  . aMILoride (MIDAMOR) 5 MG tablet TAKE 1 TABLET BY MOUTH EVERY MORNING. 90 tablet 1  . Ascorbic Acid (VITAMIN C) 1000 MG tablet Take 1,000 mg by mouth daily.    Marland Kitchen aspirin 325 MG EC tablet Take 325 mg by mouth every morning.     Marland Kitchen atorvastatin (LIPITOR) 10 MG tablet Take 1 tablet (10 mg total) by mouth daily. 90 tablet 1  . candesartan (ATACAND) 32 MG tablet TAKE 1/2 TO 1 TABLET BY MOUTH EVERY NIGHT AS DIRECTED FOR BLOOD PRESSURE 90 tablet 1  . cetirizine (ZYRTEC) 10 MG tablet Take 10 mg by mouth daily.    . Cholecalciferol (VITAMIN D-3) 5000 UNITS TABS Take 5,000 Units by mouth daily.    . cyanocobalamin 2000 MCG tablet Take 2,000 mcg by mouth daily.    Marland Kitchen EPINEPHRINE 0.3 mg/0.3 mL IJ SOAJ injection USE AS DIRECTED 2 Device PRN  . escitalopram (LEXAPRO) 20 MG tablet TAKE 1 TABLET BY MOUTH ONCE DAILY 90 tablet 3  . esomeprazole (NEXIUM) 40 MG capsule TAKE 1 CAPSULE BY MOUTH DAILY. 90 capsule 3  . estradiol (ESTRACE) 1 MG tablet TAKE 1/2 TO 1 TABLET BY MOUTH ONCE DAILY FOR VASOMOTOR SYMPTOMS 90 tablet 4  . famciclovir (FAMVIR) 500 MG tablet Take 500 mg by mouth 3 (three) times daily. Take AD for fever blister    . fluticasone (FLONASE) 50 MCG/ACT nasal spray Place 1 spray into both nostrils daily. 48 g 2  . Glucosamine-Chondroitin (GLUCOSAMINE CHONDR COMPLEX PO) Take 1,500 mg by mouth. 1500mg /1200mg   Actual dose    . metFORMIN (GLUCOPHAGE-XR) 500 MG 24 hr tablet TAKE 2 TABLETS BY MOUTH TWICE DAILY 360 tablet PRN  . phentermine (ADIPEX-P) 37.5 MG tablet Take 1 tablet (37.5 mg total) by mouth daily before breakfast. 30 tablet 2  . propranolol (INDERAL) 80 MG tablet TAKE 1 TABLET BY MOUTH TWICE DAILY 180 tablet 1  . SYNTHROID 137 MCG tablet TAKE 1 TABLET BY MOUTH ONCE A DAY BEFORE BREAKFAST. 90 tablet 1  . traMADol (ULTRAM) 50 MG tablet Take 1 tablet (50 mg total) by mouth every 6 (six) hours as needed. 60 tablet  3   No current facility-administered medications on file prior to visit.    Health Maintenance:   Immunization History  Administered Date(s) Administered  . DTaP 12/07/2012  . Influenza-Unspecified 11/22/2012, 11/12/2014  . Pneumococcal Polysaccharide-23 02/22/2009  . Td 02/23/2000  . Tdap 12/07/2012  . Zoster 10/29/2010   Tetanus: 2014 Pneumovax: 2011 Flu vaccine: 10/2016 at [facility] Prevnar 13: N/A Zostavax:2012  LMP: 2005 p. Hysterectomy, 1 ovary and cervix Pap: 11/2013,  + stress incontinence due 2020  MGM: 05/2015 category A, no SBE DUE DEXA: N/A TB gold- negative Colonoscopy: 04/2014, Dr. Madilyn Fireman.  EGD: N/A CT AB 2009 CXR 12/2012 US thyroid and BX 2013, Dr. Sharl Ma.  Last Dental Exam: Dr. Dan Humphreys Last Eye Exam: Dr. Charise Killian   Patient Care Team: Lucky Cowboy, MD as PCP - General (Internal Medicine)  Dorena CookeyHayes, John, MD as Consulting Physician (Gastroenterology) Talmage CoinKerr, Jeffrey, MD as Consulting Physician (Endocrinology)  Medical History:  Past Medical History:  Diagnosis Date  . GERD (gastroesophageal reflux disease)   . High cholesterol   . Hypertension   . Hypothyroidism   . Multinodular goiter    Allergies Allergies  Allergen Reactions  . Avelox [Moxifloxacin Hcl In Nacl] Hives    Thrush, throat might have closed   . Hydrochlorothiazide Itching    On hands and feet  . Other Itching    All "Cillins", hives alao  . Penicillins     Hives  . Prednisone     Chest pains  . Zostavax [Zoster Vaccine Live]   . Codeine Hives, Itching and Rash  . Levothyroxine Rash    Only to generic. Not to brand synthroid.   . Sulfa Antibiotics Itching and Rash    Hands and feet itch and turn red    SURGICAL HISTORY She  has a past surgical history that includes Carpal tunnel release (yrs ago); Knee surgery (arthroscopic, 3 yrs ago); Tonsillectomy (age 56); Abdominal hysterectomy (8 yrs ago); Tubal ligation (yrs ago); radioactive iodine to thyroid' (march 2013); and  Cholecystectomy (12/28/2011). FAMILY HISTORY Her family history includes Cancer in her mother; Heart attack in her father; Stroke in her father and mother. SOCIAL HISTORY She  reports that  has never smoked. she has never used smokeless tobacco. She reports that she does not drink alcohol or use drugs.  Review of Systems  Constitutional: Negative.  Negative for chills and fever.  HENT: Negative.   Eyes: Negative.   Respiratory: Negative.  Negative for shortness of breath.   Cardiovascular: Negative.  Negative for chest pain.  Gastrointestinal: Negative.   Genitourinary: Negative for dysuria, flank pain, frequency, hematuria and urgency.  Musculoskeletal: Positive for joint pain. Negative for back pain, falls, myalgias and neck pain.  Skin: Negative for rash.  Neurological: Negative.   Endo/Heme/Allergies: Negative.   Psychiatric/Behavioral: Negative.  Negative for depression. The patient does not have insomnia.      Physical Exam: Estimated body mass index is 46.56 kg/m as calculated from the following:   Height as of this encounter: 5\' 1"  (1.549 m).   Weight as of this encounter: 246 lb 6.4 oz (111.8 kg). BP 124/74   Pulse 71   Temp (!) 97 F (36.1 C)   Resp 16   Ht 5\' 1"  (1.549 m)   Wt 246 lb 6.4 oz (111.8 kg)   SpO2 95%   BMI 46.56 kg/m  General Appearance: Well nourished, in no apparent distress.  Eyes: PERRLA, EOMs, conjunctiva no swelling or erythema, normal fundi and vessels.  Sinuses: No Frontal/maxillary tenderness  ENT/Mouth: Ext aud canals clear, normal light reflex with TMs without erythema, bulging. Good dentition. No erythema, swelling, or exudate on post pharynx. Tonsils not swollen or erythematous. Hearing normal.  Neck: Supple, thyroid normal. No bruits  Respiratory: Respiratory effort normal, BS equal bilaterally without rales, rhonchi, wheezing or stridor.  Cardio: RRR without murmurs, rubs or gallops. Brisk peripheral pulses without edema.  Chest:  symmetric, with normal excursions and percussion.  Breasts: Symmetric, without lumps, nipple discharge, retractions. + well circumscribed erythematous rash under bilateral breast with satellite lesions.  Abdomen: Soft, nontender, obese, no guarding, rebound, hernias, masses, or organomegaly. .  Lymphatics: Non tender without lymphadenopathy.  Genitourinary: defer Musculoskeletal: Full ROM all peripheral extremities,5/5 strength, and normal gait.  Skin: Warm, dry without rashes, lesions, ecchymosis. Neuro: Cranial nerves intact, reflexes equal  bilaterally. Normal muscle tone, no cerebellar symptoms. Sensation intact.  Psych: Awake and oriented X 3, normal affect, Insight and Judgment appropriate.   EKG: WNL no changes. AORTA SCAN: defer   Quentin Mulling 9:33 AM [facility]

## 2017-02-08 ENCOUNTER — Encounter: Payer: Self-pay | Admitting: Physician Assistant

## 2017-02-08 ENCOUNTER — Ambulatory Visit (INDEPENDENT_AMBULATORY_CARE_PROVIDER_SITE_OTHER): Payer: 59 | Admitting: Physician Assistant

## 2017-02-08 VITALS — BP 124/74 | HR 71 | Temp 97.0°F | Resp 16 | Ht 61.0 in | Wt 246.4 lb

## 2017-02-08 DIAGNOSIS — Z0000 Encounter for general adult medical examination without abnormal findings: Secondary | ICD-10-CM

## 2017-02-08 DIAGNOSIS — Z0001 Encounter for general adult medical examination with abnormal findings: Secondary | ICD-10-CM

## 2017-02-08 DIAGNOSIS — I10 Essential (primary) hypertension: Secondary | ICD-10-CM | POA: Diagnosis not present

## 2017-02-08 DIAGNOSIS — Z79899 Other long term (current) drug therapy: Secondary | ICD-10-CM

## 2017-02-08 DIAGNOSIS — E7800 Pure hypercholesterolemia, unspecified: Secondary | ICD-10-CM

## 2017-02-08 DIAGNOSIS — E559 Vitamin D deficiency, unspecified: Secondary | ICD-10-CM

## 2017-02-08 DIAGNOSIS — N951 Menopausal and female climacteric states: Secondary | ICD-10-CM

## 2017-02-08 DIAGNOSIS — E039 Hypothyroidism, unspecified: Secondary | ICD-10-CM

## 2017-02-08 DIAGNOSIS — R7303 Prediabetes: Secondary | ICD-10-CM

## 2017-02-08 DIAGNOSIS — K2100 Gastro-esophageal reflux disease with esophagitis, without bleeding: Secondary | ICD-10-CM

## 2017-02-08 DIAGNOSIS — Z6841 Body Mass Index (BMI) 40.0 and over, adult: Secondary | ICD-10-CM

## 2017-02-08 DIAGNOSIS — Z130 Encounter for screening for diseases of the blood and blood-forming organs and certain disorders involving the immune mechanism: Secondary | ICD-10-CM

## 2017-02-08 MED ORDER — PHENTERMINE HCL 37.5 MG PO TABS
37.5000 mg | ORAL_TABLET | Freq: Every day | ORAL | 2 refills | Status: DC
Start: 2017-02-08 — End: 2017-10-28

## 2017-02-08 MED ORDER — FLUTICASONE PROPIONATE 50 MCG/ACT NA SUSP: 1.0000 | Freq: Every day | NASAL | 2 refills | Status: DC

## 2017-02-08 MED FILL — FLUTICASONE PROP 50 MCG SPR: 50 | 90 days supply | Qty: 32 | Fill #0

## 2017-02-08 NOTE — Patient Instructions (Addendum)
Vionic shoes  [facility]  7 a.m.-6:30 p.m., Monday 7 a.m.-5 p.m., Tuesday-Friday Schedule an appointment by calling [phone].   Being dehydrated can hurt your kidneys, cause fatigue, headaches, muscle aches, joint pain, and dry skin/nails so please increase your fluids.   Drink 80-100 oz a day of water, measure it out! Eat 3 meals a day, have to do breakfast, eat protein- hard boiled eggs, protein bar like nature valley protein bar, greek yogurt like oikos triple zero, chobani 100, or light n fit greek  Can check out plantnanny app on your phone to help you keep track of your water  Phentermine  While taking the medication we may ask that you come into the office once a month or once every 2-3 months to monitor your weight, blood pressure, and heart rate. In addition we can help answer your questions about diet, exercise, and help you every step of the way with your weight loss journey. Sometime it is helpful if you bring in a food diary or use an app on your phone such as myfitnesspal to record your calorie intake, especially in the beginning.   You can start out on 1/3 to 1/2 a pill in the morning and if you are tolerating it well you can increase to one pill daily. I also have some patients that take 1/3 or 1/2 at lunch to help prevent night time eating.  This medication is cheapest CASH pay at [facility] OR [employer] OR [facility] is 14-17 dollars and you do NOT need a membership to get meds from there.    What is this medicine? PHENTERMINE (FEN ter meen) decreases your appetite. This medicine is intended to be used in addition to a healthy reduced calorie diet and exercise. The best results are achieved this way. This medicine is only indicated for short-term use. Eventually your weight loss may level out and the medication will no longer be needed.   How should I use this medicine? Take this medicine by mouth. Follow the directions on the prescription  label. The tablets should stay in the bottle until immediately before you take your dose. Take your doses at regular intervals. Do not take your medicine more often than directed.  Overdosage: If you think you have taken too much of this medicine contact a poison control center or emergency room at once. NOTE: This medicine is only for you. Do not share this medicine with others.  What if I miss a dose? If you miss a dose, take it as soon as you can. If it is almost time for your next dose, take only that dose. Do not take double or extra doses. Do not increase or in any way change your dose without consulting your doctor.  What should I watch for while using this medicine? Notify your physician immediately if you become short of breath while doing your normal activities. Do not take this medicine within 6 hours of bedtime. It can keep you from getting to sleep. Avoid drinks that contain caffeine and try to stick to a regular bedtime every night. Do not stand or sit up quickly, especially if you are an older patient. This reduces the risk of dizzy or fainting spells. Avoid alcoholic drinks.  What side effects may I notice from receiving this medicine? Side effects that you should report to your doctor or health care professional as soon as possible: -chest pain, palpitations -depression or severe changes in mood -increased blood pressure -irritability -  nervousness or restlessness -severe dizziness -shortness of breath -problems urinating -unusual swelling of the legs -vomiting  Side effects that usually do not require medical attention (report to your doctor or health care professional if they continue or are bothersome): -blurred vision or other eye problems -changes in sexual ability or desire -constipation or diarrhea -difficulty sleeping -dry mouth or unpleasant taste -headache -nausea This list may not describe all possible side effects. Call your doctor for medical advice about  side effects. You may report side effects to FDA at 1-800-FDA-1088.  VAGINAL DRYNESS OVERVIEW  Vaginal dryness, also known as atrophic vaginitis, is a common condition in postmenopausal women. This condition is also common in women who have had both ovaries removed at the time of hysterectomy.   Some women have uncomfortable symptoms of vaginal dryness, such as pain with sex, burning vaginal discomfort or itching, or abnormal vaginal discharge, while others have no symptoms at all.  VAGINAL DRYNESS CAUSES   Estrogen helps to keep the vagina moist and to maintain thickness of the vaginal lining. Vaginal dryness occurs when the ovaries produce a decreased amount of estrogen. This can occur at certain times in a woman's life, and may be permanent or temporary. Times when less estrogen is made include: ?At the time of menopause. ?After surgical removal of the ovaries, chemotherapy, or radiation therapy of the pelvis for cancer. ?After having a baby, particularly in women who breastfeed. ?While using certain medications, such as danazol, medroxyprogesterone (brand names: Provera or DepoProvera), leuprolide (brand name: Lupron), or nafarelin. When these medications are stopped, estrogen production resumes.  Women who smoke cigarettes have been shown to have an increased risk of an earlier menopause transition as compared to non-smokers. Therefore, atrophic vaginitis symptoms may appear at a younger age in this population.  VAGINAL DRYNESS TREATMENT   There are three treatment options for women with vaginal dryness:  Vaginal lubricants and moisturizers - Vaginal lubricants and moisturizers can be purchased without a prescription. These products do not contain any hormones and have virtually no side effects. - Albolene is found in the facial cleanser section at [facility], [facility], or [employer]. It is a large jar with a blue top. This is the best lubricant for women because it is hypoallergenic. -Natural  lubricants, such as olive, avocado or peanut oil, are easily available products that may be used as a lubricant with sex.  -Vaginal moisturizes (eg, Replens, Moist Again, Vagisil, K-Y Silk-E, and Feminease) are formulated to allow water to be retained in the vaginal tissues. Moisturizers are applied into the vagina three times weekly to allow a continued moisturizing effect. These should not be used just before having sex, as they can be irritating.  Vaginal estrogen - Vaginal estrogen is the most effective treatment option for women with vaginal dryness. Vaginal estrogen must be prescribed by a healthcare provider. Very low doses of vaginal estrogen can be used when it is put into the vagina to treat vaginal dryness. A small amount of estrogen is absorbed into the bloodstream, but only about 100 times less than when using estrogen pills or tablets. As a result, there is a much lower risk of side effects, such as blood clots, breast cancer, and heart attack, compared with other estrogen-containing products (birth control pills, menopausal hormone therapy).   Ospemifene - Ospemifene is a prescription medication that is similar to estrogen, but is not estrogen. In the vaginal tissue, it acts similarly to estrogen. In the breast tissue, it acts as an estrogen blocker.  It comes in a pill, and is prescribed for women who want to use an estrogen-like medication for vaginal dryness or painful sex associated with vaginal dryness, but prefer not to use a vaginal medication. The medication may cause hot flashes as a side effect. This type of medication may increase the risk of blood clots or uterine cancer. Further study of ospemifene is needed to evaluate the risk of these complications. This medication has not been tested in women who have had breast cancer or are at a high risk of developing breast cancer.    Sexual activity - Vaginal estrogen improves vaginal dryness quickly, usually within a few weeks. You may  continue to have sex as you treat vaginal dryness because sex itself can help to keep the vaginal tissues healthy. Vaginal intercourse may help the vaginal tissues by keeping them soft and stretchable and preventing the tissues from shrinking.  If sex continues to be painful despite treatment for vaginal dryness, talk to your healthcare provider.   Morton Neuralgia Morton neuralgia is a type of foot pain in the area closest to your toes. This area is sometimes called the ball of your foot. Morton neuralgia occurs when a branch of a nerve in your foot (digital nerve) becomes compressed. When this happens over a long period of time, the nerve can thicken (neuroma) and cause pain. This usually occurs between the third and fourth toe. Morton neuralgia can come and go but may get worse over time. What are the causes? Your digital nerve can become compressed and stretched at a point where it passes under a thick band of tissue that connects your toes (intermetatarsal ligament). Morton neuralgia can be caused by mild repetitive damage in this area. This type of damage can result from:  Activities such as running or jumping.  Wearing shoes that are too tight.  What increases the risk? You may be at risk for Morton neuralgia if you:  Are female.  Wear high heels.  Wear shoes that are narrow or tight.  Participate in activities that stretch your toes. These include: ? Running. ? Ballet. ? Long-distance walking.  What are the signs or symptoms? The first symptom of Morton neuralgia is pain that spreads from the ball of your foot to your toes. It may feel like you are walking on a marble. Pain usually gets worse with walking and goes away at night. Other symptoms may include numbness and cramping of your toes. How is this diagnosed? Your health care provider will do a physical exam. When doing the exam, your health care provider may:  Squeeze your foot just behind your toe.  Ask you to move  your toes to check for pain.  You may also have tests on your foot to confirm the diagnosis. These may include:  An X-ray.  An MRI.  How is this treated? Treatment for Morton neuralgia may be as simple as changing the kind of shoes you wear. Other treatments may include:  Wearing a supportive pad (orthosis) under the front of your foot. This lifts your toe bones and takes pressure off the nerve.  Getting injections of numbing medicine and anti-inflammatory medicine (steroid) in the nerve.  Having surgery to remove part of the thickened nerve.  Follow these instructions at home:  Take medicine only as directed by your health care provider.  Wear soft-soled shoes with a wide toe area.  Stop activities that may be causing pain.  Elevate your foot when resting.  Massage your foot.  Apply ice to the injured area: ? Put ice in a plastic bag. ? Place a towel between your skin and the bag. ? Leave the ice on for 20 minutes, 2-3 times a day.  Keep all follow-up visits as directed by your health care provider. This is important. Contact a health care provider if:  Home care instructions are not helping you get better.  Your symptoms change or get worse. This information is not intended to replace advice given to you by your health care provider. Make sure you discuss any questions you have with your health care provider. Document Released: 05/17/2000 Document Revised: 07/17/2015 Document Reviewed: 04/11/2013 Elsevier Interactive Patient Education  [employer]2018 [employer]

## 2017-02-09 LAB — TSH: TSH: 1.17 mIU/L (ref 0.40–4.50)

## 2017-02-09 LAB — BASIC METABOLIC PANEL WITH GFR
BUN: 14 mg/dL (ref 7–25)
CALCIUM: 9.7 mg/dL (ref 8.6–10.4)
CO2: 28 mmol/L (ref 20–32)
CREATININE: 0.6 mg/dL (ref 0.50–1.05)
Chloride: 103 mmol/L (ref 98–110)
GFR, EST AFRICAN AMERICAN: 117 mL/min/{1.73_m2} (ref >=60)
GFR, Est Non African American: 101 mL/min/{1.73_m2} (ref >=60)
Glucose, Bld: 90 mg/dL (ref 65–99)
Potassium: 4.6 mmol/L (ref 3.5–5.3)
Sodium: 140 mmol/L (ref 135–146)

## 2017-02-09 LAB — URINALYSIS, ROUTINE W REFLEX MICROSCOPIC
Bilirubin Urine: NEGATIVE
Glucose, UA: NEGATIVE
Hgb urine dipstick: NEGATIVE
KETONES UR: NEGATIVE
Leukocytes, UA: NEGATIVE
NITRITE: NEGATIVE
PROTEIN: NEGATIVE
SPECIFIC GRAVITY, URINE: 1.017 (ref 1.001–1.03)
pH: <=5 (ref 5.0–8.0)

## 2017-02-09 LAB — CBC WITH DIFFERENTIAL/PLATELET
BASOS PCT: 1 %
Basophils Absolute: 106 cells/uL (ref 0–200)
EOS ABS: 445 {cells}/uL (ref 15–500)
Eosinophils Relative: 4.2 %
HCT: 40.8 % (ref 35.0–45.0)
HEMOGLOBIN: 13.8 g/dL (ref 11.7–15.5)
LYMPHS ABS: 3212 {cells}/uL (ref 850–3900)
MCH: 29 pg (ref 27.0–33.0)
MCHC: 33.8 g/dL (ref 32.0–36.0)
MCV: 85.7 fL (ref 80.0–100.0)
MPV: 10.6 fL (ref 7.5–12.5)
Monocytes Relative: 6 %
NEUTROS ABS: 6201 {cells}/uL (ref 1500–7800)
Neutrophils Relative %: 58.5 %
Platelets: 338 10*3/uL (ref 140–400)
RBC: 4.76 10*6/uL (ref 3.80–5.10)
RDW: 12.4 % (ref 11.0–15.0)
Total Lymphocyte: 30.3 %
WBC: 10.6 10*3/uL (ref 3.8–10.8)
WBCMIX: 636 {cells}/uL (ref 200–950)

## 2017-02-09 LAB — LIPID PANEL
Cholesterol: 175 mg/dL (ref <200)
HDL: 55 mg/dL (ref >50)
LDL Cholesterol (Calc): 92 mg/dL (calc)
Non-HDL Cholesterol (Calc): 120 mg/dL (calc) (ref <130)
Total CHOL/HDL Ratio: 3.2 (calc) (ref <5.0)
Triglycerides: 183 mg/dL — ABNORMAL HIGH (ref <150)

## 2017-02-09 LAB — HEPATIC FUNCTION PANEL
AG RATIO: 1.7 (calc) (ref 1.0–2.5)
ALBUMIN MSPROF: 4.3 g/dL (ref 3.6–5.1)
ALT: 26 U/L (ref 6–29)
AST: 24 U/L (ref 10–35)
Alkaline phosphatase (APISO): 94 U/L (ref 33–130)
BILIRUBIN DIRECT: 0.1 mg/dL (ref 0.0–0.2)
BILIRUBIN INDIRECT: 0.3 mg/dL (ref 0.2–1.2)
GLOBULIN: 2.5 g/dL (ref 1.9–3.7)
Total Bilirubin: 0.4 mg/dL (ref 0.2–1.2)
Total Protein: 6.8 g/dL (ref 6.1–8.1)

## 2017-02-09 LAB — MICROALBUMIN / CREATININE URINE RATIO
CREATININE, URINE: 81 mg/dL (ref 20–275)
MICROALB UR: 0.9 mg/dL
Microalb Creat Ratio: 11 mcg/mg creat (ref <30)

## 2017-02-09 LAB — IRON, TOTAL/TOTAL IRON BINDING CAP
%SAT: 21 % (ref 11–50)
Iron: 64 ug/dL (ref 45–160)
TIBC: 304 mcg/dL (calc) (ref 250–450)

## 2017-02-09 LAB — MAGNESIUM: Magnesium: 1.8 mg/dL (ref 1.5–2.5)

## 2017-02-09 LAB — VITAMIN B12: VITAMIN B 12: 728 pg/mL (ref 200–1100)

## 2017-02-09 LAB — VITAMIN D 25 HYDROXY (VIT D DEFICIENCY, FRACTURES): VIT D 25 HYDROXY: 55 ng/mL (ref 30–100)

## 2017-02-16 MED FILL — aMILoride HCL 5 MG TABS: 5 | 90 days supply | Qty: 90 | Fill #1

## 2017-02-16 MED FILL — SYNTHROID 137 MCG TABLET: 137 | 90 days supply | Qty: 90 | Fill #1

## 2017-02-16 MED FILL — ESTRADIOL 1 MG TABLET: 1 | 90 days supply | Qty: 90 | Fill #1

## 2017-02-17 MED FILL — PHENTERMINE 37.5 MG TABLET: 37.5 | 30 days supply | Qty: 30 | Fill #0

## 2017-03-09 ENCOUNTER — Other Ambulatory Visit: Payer: Self-pay | Admitting: Physician Assistant

## 2017-03-09 MED FILL — ESOMEPRAZOLE MAG DR 40 MG C: 40 | 90 days supply | Qty: 90 | Fill #0

## 2017-03-30 MED FILL — ESCITALOPRAM 20 MG TABLET: 20 | 90 days supply | Qty: 90 | Fill #2

## 2017-03-30 MED FILL — METFORMIN HCL ER 500 MG TAB: 500 | 90 days supply | Qty: 360 | Fill #2

## 2017-03-30 MED FILL — PHENTERMINE 37.5 MG TABLET: 37.5 | 30 days supply | Qty: 30 | Fill #1

## 2017-04-11 ENCOUNTER — Ambulatory Visit (INDEPENDENT_AMBULATORY_CARE_PROVIDER_SITE_OTHER): Payer: 59 | Admitting: Physician Assistant

## 2017-04-11 ENCOUNTER — Encounter: Payer: Self-pay | Admitting: Physician Assistant

## 2017-04-11 DIAGNOSIS — R05 Cough: Secondary | ICD-10-CM | POA: Diagnosis not present

## 2017-04-11 DIAGNOSIS — R059 Cough, unspecified: Secondary | ICD-10-CM

## 2017-04-11 DIAGNOSIS — Z0001 Encounter for general adult medical examination with abnormal findings: Secondary | ICD-10-CM

## 2017-04-11 MED ORDER — FLUTICASONE PROPIONATE 50 MCG/ACT NA SUSP: 1.0000 | Freq: Every day | NASAL | 2 refills | Status: DC

## 2017-04-11 MED ORDER — DOXYCYCLINE HYCLATE 100 MG PO CAPS: ORAL_CAPSULE | ORAL | 0 refills | Status: DC

## 2017-04-11 MED ORDER — TRAMADOL HCL 50 MG PO TABS: 50.0000 mg | ORAL_TABLET | Freq: Two times a day (BID) | ORAL | 0 refills | Status: DC | PRN

## 2017-04-11 MED ORDER — FLUTICASONE FUROATE-VILANTEROL 100-25 MCG/INH IN AEPB: 1.0000 | INHALATION_SPRAY | Freq: Every day | RESPIRATORY_TRACT | 0 refills | Status: DC

## 2017-04-11 MED ORDER — ALBUTEROL SULFATE HFA 108 (90 BASE) MCG/ACT IN AERS: 2.0000 | INHALATION_SPRAY | Freq: Four times a day (QID) | RESPIRATORY_TRACT | 2 refills | Status: DC | PRN

## 2017-04-11 MED FILL — VENTOLIN HFA 90 MCG INHALER: 108 (90 BAS | 25 days supply | Qty: 18 | Fill #0

## 2017-04-11 MED FILL — traMADol HCL 50 MG TABS: 50 | 15 days supply | Qty: 30 | Fill #0

## 2017-04-11 NOTE — Progress Notes (Signed)
57 y.o. female with history of HTN, chronic cough, obesity presents with 6 days of URI symptoms. She is on mucinex, water, and coricidin chest.   Symptoms include bilateral ear pain/fullness, vertigo, sore throat, cough.   Denies fever, chills.   Problem list has Bilateral leg and foot pain; Morbid obesity (HCC); Biliary dyskinesia; Hypertension; GERD (gastroesophageal reflux disease); Hypothyroidism; High cholesterol; Chronic cough; Prediabetes; Hot flashes, menopausal; Vitamin D deficiency; and Medication management on their problem list.  Medications Current Outpatient Medications on File Prior to Visit  Medication Sig  . albuterol (PROVENTIL HFA;VENTOLIN HFA) 108 (90 BASE) MCG/ACT inhaler Inhale 2 puffs into the lungs every 6 (six) hours as needed for wheezing or shortness of breath.  Marland Kitchen aMILoride (MIDAMOR) 5 MG tablet TAKE 1 TABLET BY MOUTH EVERY MORNING.  . Ascorbic Acid (VITAMIN C) 1000 MG tablet Take 1,000 mg by mouth daily.  Marland Kitchen aspirin 325 MG EC tablet Take 325 mg by mouth every morning.   Marland Kitchen atorvastatin (LIPITOR) 10 MG tablet Take 1 tablet (10 mg total) by mouth daily.  . candesartan (ATACAND) 32 MG tablet TAKE 1/2 TO 1 TABLET BY MOUTH EVERY NIGHT AS DIRECTED FOR BLOOD PRESSURE  . cetirizine (ZYRTEC) 10 MG tablet Take 10 mg by mouth daily.  . Cholecalciferol (VITAMIN D-3) 5000 UNITS TABS Take 5,000 Units by mouth daily.  . cyanocobalamin 2000 MCG tablet Take 2,000 mcg by mouth daily.  Marland Kitchen EPINEPHRINE 0.3 mg/0.3 mL IJ SOAJ injection USE AS DIRECTED  . escitalopram (LEXAPRO) 20 MG tablet TAKE 1 TABLET BY MOUTH ONCE DAILY  . esomeprazole (NEXIUM) 40 MG capsule TAKE 1 CAPSULE BY MOUTH DAILY.  Marland Kitchen estradiol (ESTRACE) 1 MG tablet TAKE 1/2 TO 1 TABLET BY MOUTH ONCE DAILY FOR VASOMOTOR SYMPTOMS  . famciclovir (FAMVIR) 500 MG tablet Take 500 mg by mouth 3 (three) times daily. Take AD for fever blister  . fluticasone (FLONASE) 50 MCG/ACT nasal spray Place 1 spray into both nostrils daily.  .  Glucosamine-Chondroitin (GLUCOSAMINE CHONDR COMPLEX PO) Take 1,500 mg by mouth. 1500mg /1200mg   Actual dose  . metFORMIN (GLUCOPHAGE-XR) 500 MG 24 hr tablet TAKE 2 TABLETS BY MOUTH TWICE DAILY  . phentermine (ADIPEX-P) 37.5 MG tablet Take 1 tablet (37.5 mg total) by mouth daily before breakfast.  . propranolol (INDERAL) 80 MG tablet TAKE 1 TABLET BY MOUTH TWICE DAILY  . SYNTHROID 137 MCG tablet TAKE 1 TABLET BY MOUTH ONCE A DAY BEFORE BREAKFAST.  Marland Kitchen traMADol (ULTRAM) 50 MG tablet Take 1 tablet (50 mg total) by mouth every 6 (six) hours as needed.   No current facility-administered medications on file prior to visit.     Allergies  Allergen Reactions  . Avelox [Moxifloxacin Hcl In Nacl] Hives    Thrush, throat might have closed   . Hydrochlorothiazide Itching    On hands and feet  . Other Itching    All "Cillins", hives alao  . Penicillins     Hives  . Prednisone     Chest pains  . Zostavax [Zoster Vaccine Live]   . Codeine Hives, Itching and Rash  . Levothyroxine Rash    Only to generic. Not to brand synthroid.   . Sulfa Antibiotics Itching and Rash    Hands and feet itch and turn red   ROS See HPI  Physical Exam  Constitutional: She is oriented to person, place, and time. She appears well-developed and well-nourished.  HENT:  Right Ear: Hearing and external ear normal. No mastoid tenderness. Tympanic membrane is injected. Tympanic  membrane is not perforated, not erythematous, not retracted and not bulging. A middle ear effusion is present.  Left Ear: Hearing and external ear normal. No mastoid tenderness. Tympanic membrane is injected. Tympanic membrane is not perforated, not erythematous, not retracted and not bulging. A middle ear effusion is present.  Nose: Right sinus exhibits maxillary sinus tenderness. Left sinus exhibits maxillary sinus tenderness.  Mouth/Throat: Uvula is midline, oropharynx is clear and moist and mucous membranes are normal. No oropharyngeal exudate.   Eyes: Conjunctivae and EOM are normal. Pupils are equal, round, and reactive to light.  Neck: Neck supple.  Cardiovascular: Normal rate and regular rhythm.  Pulmonary/Chest: Effort normal and breath sounds normal. No respiratory distress. She has no wheezes.  Abdominal: Soft. Bowel sounds are normal.  Musculoskeletal: Normal range of motion.  Lymphadenopathy:    She has no cervical adenopathy.  Neurological: She is alert and oriented to person, place, and time.  Skin: Skin is warm and dry.     Assessment and Plan: Since it has only been 6 days, we do not want to send in an antibiotic yet. We want to wait 7-10 days for you body to fight off an infection like a virus. We can treat the inflammation which will treat the symptoms. If you are not better after 7-10 days we will send in an antibiotic for you, written for delay fill on the 22nd. Breo and albuterol given.  Norma Sanchez was seen today for acute visit.  Cough Will hold the Doxy and take if she is not getting better, increase fluids, rest, cont allergy pill -     fluticasone (FLONASE) 50 MCG/ACT nasal spray; Place 1 spray into both nostrils daily. -     albuterol (PROVENTIL HFA;VENTOLIN HFA) 108 (90 Base) MCG/ACT inhaler; Inhale 2 puffs into the lungs every 6 (six) hours as needed for wheezing or shortness of breath. -     traMADol (ULTRAM) 50 MG tablet; Take 1 tablet (50 mg total) by mouth every 12 (twelve) hours as needed (cough). -     fluticasone furoate-vilanterol (BREO ELLIPTA) 100-25 MCG/INH AEPB; Inhale 1 puff into the lungs daily. Rinse mouth with water after each use -     doxycycline (VIBRAMYCIN) 100 MG capsule; Take 1 capsule twice daily with food  If you are not getting better call the office for an office visit. If you are getting worse go to the ER.

## 2017-04-11 NOTE — Patient Instructions (Signed)

## 2017-04-26 ENCOUNTER — Other Ambulatory Visit: Payer: Self-pay | Admitting: Internal Medicine

## 2017-04-26 MED FILL — PROPRANOLOL HCL 80 MG TAB: 80 | 90 days supply | Qty: 180 | Fill #0

## 2017-04-26 MED FILL — CANDESARTAN CILEXETIL 32 MG: 32 | 90 days supply | Qty: 90 | Fill #1

## 2017-04-28 ENCOUNTER — Other Ambulatory Visit: Payer: Self-pay | Admitting: Physician Assistant

## 2017-04-28 MED ORDER — FLUCONAZOLE 150 MG PO TABS: 150.0000 mg | ORAL_TABLET | Freq: Every day | ORAL | 3 refills | Status: DC

## 2017-04-28 MED FILL — FLUCONAZOLE 150 MG TABLET: 150 | 1 days supply | Qty: 1 | Fill #0

## 2017-05-17 ENCOUNTER — Other Ambulatory Visit: Payer: Self-pay | Admitting: Internal Medicine

## 2017-05-17 MED FILL — SYNTHROID 137 MCG TABLET: 137 | 90 days supply | Qty: 90 | Fill #0

## 2017-05-17 MED FILL — aMILoride HCL 5 MG TABS: 5 | 90 days supply | Qty: 90 | Fill #0

## 2017-05-31 MED FILL — ESTRADIOL 1 MG TABLET: 1 | 90 days supply | Qty: 90 | Fill #2

## 2017-06-13 MED FILL — ESOMEPRAZOLE MAG DR 40 MG C: 40 | 90 days supply | Qty: 90 | Fill #1

## 2017-06-19 NOTE — Progress Notes (Signed)
Assessment and Plan:   Hypertension -Continue medication, monitor blood pressure at home. Continue DASH diet.  Reminder to go to the ER if any CP, SOB, nausea, dizziness, severe HA, changes vision/speech, left arm numbness and tingling and jaw pain.   Cholesterol -Continue diet and exercise.   Vitamin D Def - continue medications.    Morbid Obesity with co morbidities- - follow up 3 months for progress monitoring - increase veggies, decrease carbs - long discussion about weight loss, diet, and exercise  Hypothyroidism -check TSH level, continue medications the same, reminded to take on an empty stomach 30-60mins before food.   WANTS 937m DAY OF MEDICATIONS Every 4 months for visit.  Continue diet and meds as discussed. Further disposition pending results of labs.Over 30 minutes of exam, counseling, chart review, and critical decision making was performed Future Appointments  Date Time Provider Department Center  10/18/2017  8:45 AM Quentin Mulling, PA-C [facility] None  02/24/2018  9:30 AM Quentin Mulling, PA-C [facility] None    HPI 57 y.o. female  presents for 3 month follow up on hypertension, cholesterol, prediabetes, and vitamin D deficiency.   Her blood pressure has been controlled at home, today their BP is BP: 124/86  She does not workout. She denies chest pain, shortness of breath, dizziness.  She is on cholesterol medication, lipitor 10 pill daily and denies myalgias. Her cholesterol is at goal. The cholesterol last visit was:   Lab Results  Component Value Date   CHOL 175 02/08/2017   HDL 55 02/08/2017   LDLCALC 92 02/08/2017   TRIG 183 (H) 02/08/2017   CHOLHDL 3.2 02/08/2017   She has been working on diet and exercise for prediabetes, and denies paresthesia of the feet, polydipsia, polyuria and visual disturbances.  Last A1C in the office was:  Lab Results  Component Value Date   HGBA1C 5.4 02/06/2016  Patient is on Vitamin D supplement, 5000 IU dailly.    Lab  Results  Component Value Date   VD25OH 55 02/08/2017   She is on thyroid medication. Her medication was not changed last visit.  Lab Results  Component Value Date   TSH 1.17 02/08/2017   BMI is Body mass index is 47.43 kg/m., she is working on diet and exercise.    Wt Readings from Last 3 Encounters:  06/21/17 251 lb (113.9 kg)  04/11/17 231 lb 9.6 oz (105.1 kg)  02/08/17 246 lb 6.4 oz (111.8 kg)     Current Medications:  Current Outpatient Medications on File Prior to Visit  Medication Sig Dispense Refill  . albuterol (PROVENTIL HFA;VENTOLIN HFA) 108 (90 Base) MCG/ACT inhaler Inhale 2 puffs into the lungs every 6 (six) hours as needed for wheezing or shortness of breath. 18 g 2  . aMILoride (MIDAMOR) 5 MG tablet TAKE 1 TABLET BY MOUTH EVERY MORNING. 90 tablet 1  . Ascorbic Acid (VITAMIN C) 1000 MG tablet Take 1,000 mg by mouth daily.    Marland Kitchen aspirin 325 MG EC tablet Take 325 mg by mouth every morning.     Marland Kitchen atorvastatin (LIPITOR) 10 MG tablet Take 1 tablet (10 mg total) by mouth daily. 90 tablet 1  . candesartan (ATACAND) 32 MG tablet TAKE 1/2 TO 1 TABLET BY MOUTH EVERY NIGHT AS DIRECTED FOR BLOOD PRESSURE 90 tablet 1  . cetirizine (ZYRTEC) 10 MG tablet Take 10 mg by mouth daily.    . Cholecalciferol (VITAMIN D-3) 5000 UNITS TABS Take 5,000 Units by mouth daily.    . cyanocobalamin  2000 MCG tablet Take 2,000 mcg by mouth daily.    Marland Kitchen EPINEPHRINE 0.3 mg/0.3 mL IJ SOAJ injection USE AS DIRECTED 2 Device PRN  . escitalopram (LEXAPRO) 20 MG tablet TAKE 1 TABLET BY MOUTH ONCE DAILY 90 tablet 3  . esomeprazole (NEXIUM) 40 MG capsule TAKE 1 CAPSULE BY MOUTH DAILY. 90 capsule 3  . estradiol (ESTRACE) 1 MG tablet TAKE 1/2 TO 1 TABLET BY MOUTH ONCE DAILY FOR VASOMOTOR SYMPTOMS 90 tablet 4  . famciclovir (FAMVIR) 500 MG tablet Take 500 mg by mouth 3 (three) times daily. Take AD for fever blister    . fluticasone (FLONASE) 50 MCG/ACT nasal spray Place 1 spray into both nostrils daily. 48 g 2  .  Glucosamine-Chondroitin (GLUCOSAMINE CHONDR COMPLEX PO) Take 1,500 mg by mouth. /1200mg   Actual dose    . metFORMIN (GLUCOPHAGE-XR) 500 MG 24 hr tablet TAKE 2 TABLETS BY MOUTH TWICE DAILY 360 tablet PRN  . phentermine (ADIPEX-P) 37.5 MG tablet Take 1 tablet (37.5 mg total) by mouth daily before breakfast. 30 tablet 2  . propranolol (INDERAL) 80 MG tablet TAKE 1 TABLET BY MOUTH TWICE DAILY 180 tablet 1  . SYNTHROID 137 MCG tablet TAKE 1 TABLET BY MOUTH ONCE A DAY BEFORE BREAKFAST. 90 tablet 1  . traMADol (ULTRAM) 50 MG tablet Take 1 tablet (50 mg total) by mouth every 12 (twelve) hours as needed (cough). 30 tablet 0   No current facility-administered medications on file prior to visit.    Medical History:  Past Medical History:  Diagnosis Date  . GERD (gastroesophageal reflux disease)   . High cholesterol   . Hypertension   . Hypothyroidism   . Multinodular goiter    Allergies:  Allergies  Allergen Reactions  . Avelox [Moxifloxacin Hcl In Nacl] Hives    Thrush, throat might have closed   . Hydrochlorothiazide Itching    On hands and feet  . Other Itching    All "Cillins", hives alao  . Penicillins     Hives  . Prednisone     Chest pains  . Zostavax [Zoster Vaccine Live]   . Codeine Hives, Itching and Rash  . Levothyroxine Rash    Only to generic. Not to brand synthroid.   . Sulfa Antibiotics Itching and Rash    Hands and feet itch and turn red    Review of Systems:  Review of Systems  Constitutional: Negative.   HENT: Negative.   Eyes: Negative.   Respiratory: Negative.   Cardiovascular: Negative for chest pain, palpitations, orthopnea, claudication, leg swelling and PND.  Gastrointestinal: Negative.   Genitourinary: Negative.   Musculoskeletal: Positive for joint pain (bilateral knees).  Skin: Negative.   Neurological: Negative.   Endo/Heme/Allergies: Negative.   Psychiatric/Behavioral: Negative.     Family history- Review and unchanged Social history-  Review and unchanged Physical Exam: BP 124/86   Pulse 67   Temp (!) 97.4 F (36.3 C)   Resp 16   Ht  (1.549 m)   Wt 251 lb (113.9 kg)   SpO2 97%   BMI 47.43 kg/m  Wt Readings from Last 3 Encounters:  06/21/17 251 lb (113.9 kg)  04/11/17 231 lb 9.6 oz (105.1 kg)  02/08/17 246 lb 6.4 oz (111.8 kg)   General Appearance: Well nourished, in no apparent distress. Eyes: PERRLA, EOMs, conjunctiva no swelling or erythema Sinuses: No Frontal/maxillary tenderness ENT/Mouth: Ext aud canals clear, TMs without erythema, bulging. No erythema, swelling, or exudate on post pharynx.  Tonsils not swollen  or erythematous. Hearing normal.  Neck: Supple, thyroid normal.  Respiratory: Respiratory effort normal, BS equal bilaterally without rales, rhonchi, wheezing or stridor.  Cardio: RRR with no MRGs. Brisk peripheral pulses with 2+ edema.  Abdomen: Soft, + BS, obese,  Non tender, no guarding, rebound, hernias, masses. Lymphatics: Non tender without lymphadenopathy.  Musculoskeletal: Full ROM, 5/5 strength Skin: Warm, dry without rashes, lesions, ecchymosis.  Neuro: Cranial nerves intact. Normal muscle tone, no cerebellar symptoms. Psych: Awake and oriented X 3, normal affect, Insight and Judgment appropriate.    Quentin Mulling, PA-C  8:49 AM [facility]

## 2017-06-21 ENCOUNTER — Ambulatory Visit: Payer: 59 | Admitting: Physician Assistant

## 2017-06-21 ENCOUNTER — Encounter: Payer: Self-pay | Admitting: Physician Assistant

## 2017-06-21 VITALS — BP 124/86 | HR 67 | Temp 97.4°F | Resp 16 | Ht 61.0 in | Wt 251.0 lb

## 2017-06-21 DIAGNOSIS — I10 Essential (primary) hypertension: Secondary | ICD-10-CM

## 2017-06-21 DIAGNOSIS — E7800 Pure hypercholesterolemia, unspecified: Secondary | ICD-10-CM | POA: Diagnosis not present

## 2017-06-21 DIAGNOSIS — E039 Hypothyroidism, unspecified: Secondary | ICD-10-CM | POA: Diagnosis not present

## 2017-06-21 DIAGNOSIS — Z79899 Other long term (current) drug therapy: Secondary | ICD-10-CM | POA: Diagnosis not present

## 2017-06-21 LAB — CBC WITH DIFFERENTIAL/PLATELET
BASOS PCT: 0.7 %
Basophils Absolute: 97 cells/uL (ref 0–200)
EOS PCT: 3 %
Eosinophils Absolute: 414 cells/uL (ref 15–500)
HEMATOCRIT: 40.1 % (ref 35.0–45.0)
Hemoglobin: 13.5 g/dL (ref 11.7–15.5)
LYMPHS ABS: 4043 {cells}/uL — AB (ref 850–3900)
MCH: 28.8 pg (ref 27.0–33.0)
MCHC: 33.7 g/dL (ref 32.0–36.0)
MCV: 85.7 fL (ref 80.0–100.0)
MPV: 11 fL (ref 7.5–12.5)
Monocytes Relative: 5.7 %
NEUTROS ABS: 8459 {cells}/uL — AB (ref 1500–7800)
Neutrophils Relative %: 61.3 %
PLATELETS: 330 10*3/uL (ref 140–400)
RBC: 4.68 10*6/uL (ref 3.80–5.10)
RDW: 12.7 % (ref 11.0–15.0)
Total Lymphocyte: 29.3 %
WBC: 13.8 10*3/uL — AB (ref 3.8–10.8)
WBCMIX: 787 {cells}/uL (ref 200–950)

## 2017-06-21 LAB — LIPID PANEL
CHOL/HDL RATIO: 2.5 (calc) (ref <5.0)
CHOLESTEROL: 163 mg/dL (ref <200)
HDL: 64 mg/dL (ref >50)
LDL CHOLESTEROL (CALC): 76 mg/dL
NON-HDL CHOLESTEROL (CALC): 99 mg/dL (ref <130)
Triglycerides: 144 mg/dL (ref <150)

## 2017-06-21 LAB — COMPLETE METABOLIC PANEL WITH GFR
AG RATIO: 1.6 (calc) (ref 1.0–2.5)
ALT: 20 U/L (ref 6–29)
AST: 16 U/L (ref 10–35)
Albumin: 4.4 g/dL (ref 3.6–5.1)
Alkaline phosphatase (APISO): 95 U/L (ref 33–130)
BUN: 18 mg/dL (ref 7–25)
CALCIUM: 9.8 mg/dL (ref 8.6–10.4)
CO2: 25 mmol/L (ref 20–32)
Chloride: 101 mmol/L (ref 98–110)
Creat: 0.68 mg/dL (ref 0.50–1.05)
GFR, EST NON AFRICAN AMERICAN: 97 mL/min/{1.73_m2} (ref >=60)
GFR, Est African American: 113 mL/min/{1.73_m2} (ref >=60)
GLOBULIN: 2.8 g/dL (ref 1.9–3.7)
Glucose, Bld: 77 mg/dL (ref 65–99)
POTASSIUM: 4.6 mmol/L (ref 3.5–5.3)
Sodium: 139 mmol/L (ref 135–146)
Total Bilirubin: 0.4 mg/dL (ref 0.2–1.2)
Total Protein: 7.2 g/dL (ref 6.1–8.1)

## 2017-06-21 LAB — TSH: TSH: 4.19 mIU/L (ref 0.40–4.50)

## 2017-06-21 NOTE — Patient Instructions (Signed)
Check out  Mini habits for weight loss book Make it a mini habit to meal prep at least 1 meal    2 apps for tracking food is myfitness pal  loseit OR can take picture of your food  Being dehydrated can hurt your kidneys, cause fatigue, headaches, muscle aches, joint pain, and dry skin/nails so please increase your fluids.   Drink 80-100 oz a day of water, measure it out! Eat 3 meals a day, have to do breakfast, eat protein- hard boiled eggs, protein bar like [employer] [place] protein bar, greek yogurt like oikos triple zero, chobani 100, or light n fit greek  Can check out plantnanny app on your phone to help you keep track of your water   Intermittent fasting is more about strategy than starvation. It's meant to reset your body in different ways, hopefully with fitness and nutrition changes as a result.  Like any big switchover, though, results may vary when it comes down to the individual level. What works for your friends may not work for you, or vice versa. That's why it's helpful to play around with variations on intermittent fasting and healthy habits and find what works best for you.  WHAT IS INTERMITTENT FASTING AND WHY DO IT?  Intermittent fasting doesn't involve specific foods, but rather, a strict schedule regarding when you eat. Also called "time-restricted eating," the tactic has been praised for its contribution to weight loss, improved body composition, and decreased cravings. Preliminary research also suggests it may be beneficial for glucose tolerance, hormone regulation, better muscle mass and lower body fat.  Part of its appeal is the simplicity of the effort. Unlike some other trends, there's no calculations to intermittent fasting.  You simply eat within a certain block of time, usually a window of 8-10 hours. In the other big block of time - about 14-16 hours, including when you're asleep - you don't eat anything, not even snacks. You can drink water, coffee, tea or any  other beverage that doesn't have calories.  For example, if you like having a late dinner, you might skip breakfast and have your first meal at noon and your last meal of the day at 8 p.m., and then not eat until noon again the next day.  IDEAS FOR GETTING STARTED  If you're new to the strategy, it may be helpful to eat within the typical circadian rhythm and keep eating within daylight hours. This can be especially beneficial if you're looking at intermittent fasting for weight-loss goals.  So first try only eating between 12pm to 8pm.  Outside of this time you may have water, black coffee, and hot tea. You may not eat it drink anything that has carbs, sugars, OR artificial sugars like diet soda.   Like any major eating and fitness shift, it can take time to find the perfect fit, so don't be afraid to experiment with different options - including ditching intermittent fasting altogether if it's simply not for you. But if it is, you may be surprised by some of the benefits that come along with the strategy.  Diet soda leads to weight gain.  We recently discovered that the artifical sugar in the soda stops an enzyme in your stomach that is suppose to signal that your brain is full. So patients that drink a lot of diet soda will never feel full and tend to over eat. So please cut back on diet soda and it can help with your weight loss.   Dining  out: help on how to choose  It is better if you can meal plan and eat at your house but sometimes life happens so here are some tips to help you make healthier choices while eating out:  1) Ask for your server to pack up 1/2 of your meal to take home for lunch or later. Portion sizes are huge so if you don't have all of it in front of you, you are less likely to eat it all.    2) Ask if they have a smaller portion or lunch portion available, this is often cheaper as well!  3) Ask to not have the bread basket brought to the table  4) Ask for the  dressing on the side.   5) Look at the nutrition menu BEFORE you go to a restaurant or fast food chain and have what you will eat in mind. You can also look it up on food tracking ups like Myfitness Pal or Lost it. However the web site for the restaurant is usually the best bet.   6) Don't forget that you are the costumer, it is okay to ask them to leave things out, ask about substituting, or ask them to cook with less oil!  Here is some more general information for you!     Veggies are great because you can eat a ton! They are low in calories, great to fill you up, and have a ton of vitamins, minerals, and protein.

## 2017-06-22 MED FILL — METFORMIN HCL ER 500 MG TAB: 500 | 90 days supply | Qty: 360 | Fill #3

## 2017-06-22 MED FILL — ESCITALOPRAM 20 MG TABLET: 20 | 90 days supply | Qty: 90 | Fill #3

## 2017-07-26 ENCOUNTER — Other Ambulatory Visit: Payer: Self-pay | Admitting: Internal Medicine

## 2017-07-26 MED FILL — PROPRANOLOL HCL 80 MG TAB: 80 | 90 days supply | Qty: 180 | Fill #1

## 2017-07-26 MED FILL — ATORVASTATIN 20 MG TABLET: 20 | 90 days supply | Qty: 90 | Fill #0

## 2017-07-27 ENCOUNTER — Other Ambulatory Visit: Payer: Self-pay | Admitting: *Deleted

## 2017-07-27 MED ORDER — ATORVASTATIN CALCIUM 20 MG PO TABS: ORAL_TABLET | ORAL | 3 refills | Status: DC

## 2017-08-22 MED FILL — ESTRADIOL 1 MG TABLET: 1 | 90 days supply | Qty: 90 | Fill #3

## 2017-08-22 MED FILL — aMILoride HCL 5 MG TABS: 5 | 90 days supply | Qty: 90 | Fill #1

## 2017-08-29 MED FILL — SYNTHROID 137 MCG TABLET: 137 | 90 days supply | Qty: 90 | Fill #1

## 2017-09-13 MED FILL — ESOMEPRAZOLE MAG DR 40 MG C: 40 | 90 days supply | Qty: 90 | Fill #2

## 2017-09-20 ENCOUNTER — Other Ambulatory Visit: Payer: Self-pay | Admitting: Physician Assistant

## 2017-09-20 MED FILL — METFORMIN HCL ER 500 MG TAB: 500 | 90 days supply | Qty: 360 | Fill #4

## 2017-09-21 MED FILL — ESCITALOPRAM 20 MG TABLET: 20 | 90 days supply | Qty: 90 | Fill #0

## 2017-10-18 ENCOUNTER — Ambulatory Visit: Payer: Self-pay | Admitting: Physician Assistant

## 2017-10-26 NOTE — Progress Notes (Signed)
Assessment and Plan:   Hypertension -Continue medication, monitor blood pressure at home. Continue DASH diet.  Reminder to go to the ER if any CP, SOB, nausea, dizziness, severe HA, changes vision/speech, left arm numbness and tingling and jaw pain.   Cholesterol -Continue diet and exercise.   Vitamin D Def - continue medications.    Morbid Obesity with co morbidities- - follow up 4 months for progress monitoring - increase veggies, decrease carbs - long discussion about weight loss, diet, and exercise  Hypothyroidism -check TSH level, continue medications the same, reminded to take on an empty stomach 30-37mins before food.   WANTS 90 DAY OF MEDICATIONS Every 4 months for visit.  Continue diet and meds as discussed. Further disposition pending results of labs.Over 30 minutes of exam, counseling, chart review, and critical decision making was performed Future Appointments  Date Time Provider Department Center  02/24/2018  9:30 AM Quentin Mulling, PA-C [facility] None    HPI 57 y.o. female  presents for 3 month follow up on hypertension, cholesterol, prediabetes, and vitamin D deficiency.   Her blood pressure has been controlled at home, today their BP is BP: 128/76  She does not workout. She denies chest pain, shortness of breath, dizziness.   She is on cholesterol medication, lipitor 10 pill daily and denies myalgias. Her cholesterol is at goal. The cholesterol last visit was:   Lab Results  Component Value Date   CHOL 163 06/21/2017   HDL 64 06/21/2017   LDLCALC 76 06/21/2017   TRIG 144 06/21/2017   CHOLHDL 2.5 06/21/2017   She has been working on diet and exercise for prediabetes, and denies paresthesia of the feet, polydipsia, polyuria and visual disturbances.   Last A1C in the office was:  Lab Results  Component Value Date   HGBA1C 5.4 02/06/2016   Patient is on Vitamin D supplement, 5000 IU dailly.    Lab Results  Component Value Date   VD25OH 55 02/08/2017     She is on thyroid medication. Her medication was not changed last visit.  Lab Results  Component Value Date   TSH 4.19 06/21/2017   BMI is Body mass index is 48.56 kg/m., she is working on diet and exercise.  Was on phentermine but with the heat stopped it but wants to restart it with the cooler weather. She does not keep a food log.  Wt Readings from Last 3 Encounters:  10/28/17 257 lb (116.6 kg)  06/21/17 251 lb (113.9 kg)  04/11/17 231 lb 9.6 oz (105.1 kg)     Current Medications:  Current Outpatient Medications on File Prior to Visit  Medication Sig Dispense Refill  . albuterol (PROVENTIL HFA;VENTOLIN HFA) 108 (90 Base) MCG/ACT inhaler Inhale 2 puffs into the lungs every 6 (six) hours as needed for wheezing or shortness of breath. 18 g 2  . aMILoride (MIDAMOR) 5 MG tablet TAKE 1 TABLET BY MOUTH EVERY MORNING. 90 tablet 1  . Ascorbic Acid (VITAMIN C) 1000 MG tablet Take 1,000 mg by mouth daily.    Marland Kitchen aspirin 325 MG EC tablet Take 325 mg by mouth every morning.     Marland Kitchen atorvastatin (LIPITOR) 20 MG tablet Take 1/2 to 1 tablet daily as directed. 90 tablet 3  . candesartan (ATACAND) 32 MG tablet TAKE 1/2 TO 1 TABLET BY MOUTH EVERY NIGHT AS DIRECTED FOR BLOOD PRESSURE 90 tablet 1  . cetirizine (ZYRTEC) 10 MG tablet Take 10 mg by mouth daily.    . Cholecalciferol (VITAMIN D-3)  5000 UNITS TABS Take 5,000 Units by mouth daily.    . cyanocobalamin 2000 MCG tablet Take 2,000 mcg by mouth daily.    Marland Kitchen EPINEPHRINE 0.3 mg/0.3 mL IJ SOAJ injection USE AS DIRECTED 2 Device PRN  . escitalopram (LEXAPRO) 20 MG tablet TAKE 1 TABLET BY MOUTH ONCE DAILY 90 tablet 3  . esomeprazole (NEXIUM) 40 MG capsule TAKE 1 CAPSULE BY MOUTH DAILY. 90 capsule 3  . estradiol (ESTRACE) 1 MG tablet TAKE 1/2 TO 1 TABLET BY MOUTH ONCE DAILY FOR VASOMOTOR SYMPTOMS 90 tablet 4  . famciclovir (FAMVIR) 500 MG tablet Take 500 mg by mouth 3 (three) times daily. Take AD for fever blister    . fluticasone (FLONASE) 50 MCG/ACT nasal  spray Place 1 spray into both nostrils daily. 48 g 2  . Glucosamine-Chondroitin (GLUCOSAMINE CHONDR COMPLEX PO) Take 1,500 mg by mouth. 1500mg /1200mg   Actual dose    . metFORMIN (GLUCOPHAGE-XR) 500 MG 24 hr tablet TAKE 2 TABLETS BY MOUTH TWICE DAILY 360 tablet PRN  . propranolol (INDERAL) 80 MG tablet TAKE 1 TABLET BY MOUTH TWICE DAILY 180 tablet 1  . SYNTHROID 137 MCG tablet TAKE 1 TABLET BY MOUTH ONCE A DAY BEFORE BREAKFAST. 90 tablet 1  . traMADol (ULTRAM) 50 MG tablet Take 1 tablet (50 mg total) by mouth every 12 (twelve) hours as needed (cough). 30 tablet 0   No current facility-administered medications on file prior to visit.    Medical History:  Past Medical History:  Diagnosis Date  . GERD (gastroesophageal reflux disease)   . High cholesterol   . Hypertension   . Hypothyroidism   . Multinodular goiter    Allergies:  Allergies  Allergen Reactions  . Avelox [Moxifloxacin Hcl In Nacl] Hives    Thrush, throat might have closed   . Hydrochlorothiazide Itching    On hands and feet  . Other Itching    All "Cillins", hives alao  . Penicillins     Hives  . Prednisone     Chest pains  . Zostavax [Zoster Vaccine Live]   . Codeine Hives, Itching and Rash  . Levothyroxine Rash    Only to generic. Not to brand synthroid.   . Sulfa Antibiotics Itching and Rash    Hands and feet itch and turn red    Review of Systems:  Review of Systems  Constitutional: Negative.   HENT: Negative.   Eyes: Negative.   Respiratory: Negative.   Cardiovascular: Negative for chest pain, palpitations, orthopnea, claudication, leg swelling and PND.  Gastrointestinal: Negative.   Genitourinary: Negative.   Musculoskeletal: Positive for joint pain (bilateral knees).  Skin: Negative.   Neurological: Negative.   Endo/Heme/Allergies: Negative.   Psychiatric/Behavioral: Negative.     Family history- Review and unchanged Social history- Review and unchanged Physical Exam: BP 128/76   Pulse 63    Temp (!) 97.3 F (36.3 C)   Resp 16   Ht 5\' 1"  (1.549 m)   Wt 257 lb (116.6 kg)   SpO2 97%   BMI 48.56 kg/m  Wt Readings from Last 3 Encounters:  10/28/17 257 lb (116.6 kg)  06/21/17 251 lb (113.9 kg)  04/11/17 231 lb 9.6 oz (105.1 kg)   General Appearance: Well nourished, in no apparent distress. Eyes: PERRLA, EOMs, conjunctiva no swelling or erythema Sinuses: No Frontal/maxillary tenderness ENT/Mouth: Ext aud canals clear, TMs without erythema, bulging. No erythema, swelling, or exudate on post pharynx.  Tonsils not swollen or erythematous. Hearing normal.  Neck: Supple, thyroid normal.  Respiratory: Respiratory effort normal, BS equal bilaterally without rales, rhonchi, wheezing or stridor.  Cardio: RRR with no MRGs. Brisk peripheral pulses with 2+ edema.  Abdomen: Soft, + BS, obese,  Non tender, no guarding, rebound, hernias, masses. Lymphatics: Non tender without lymphadenopathy.  Musculoskeletal: Full ROM, 5/5 strength Skin: Warm, dry without rashes, lesions, ecchymosis.  Neuro: Cranial nerves intact. Normal muscle tone, no cerebellar symptoms. Psych: Awake and oriented X 3, normal affect, Insight and Judgment appropriate.    Quentin Mulling, PA-C  10:35 AM [facility]

## 2017-10-28 ENCOUNTER — Ambulatory Visit: Payer: 59 | Admitting: Physician Assistant

## 2017-10-28 ENCOUNTER — Encounter: Payer: Self-pay | Admitting: Physician Assistant

## 2017-10-28 VITALS — BP 128/76 | HR 63 | Temp 97.3°F | Resp 16 | Ht 61.0 in | Wt 257.0 lb

## 2017-10-28 DIAGNOSIS — I10 Essential (primary) hypertension: Secondary | ICD-10-CM

## 2017-10-28 DIAGNOSIS — E039 Hypothyroidism, unspecified: Secondary | ICD-10-CM | POA: Diagnosis not present

## 2017-10-28 DIAGNOSIS — Z0001 Encounter for general adult medical examination with abnormal findings: Secondary | ICD-10-CM

## 2017-10-28 DIAGNOSIS — Z79899 Other long term (current) drug therapy: Secondary | ICD-10-CM

## 2017-10-28 DIAGNOSIS — R7303 Prediabetes: Secondary | ICD-10-CM

## 2017-10-28 DIAGNOSIS — E7800 Pure hypercholesterolemia, unspecified: Secondary | ICD-10-CM

## 2017-10-28 LAB — LIPID PANEL
Cholesterol: 133 mg/dL (ref <200)
HDL: 46 mg/dL — ABNORMAL LOW (ref >50)
LDL CHOLESTEROL (CALC): 67 mg/dL
Non-HDL Cholesterol (Calc): 87 mg/dL (calc) (ref <130)
Total CHOL/HDL Ratio: 2.9 (calc) (ref <5.0)
Triglycerides: 112 mg/dL (ref <150)

## 2017-10-28 LAB — CBC WITH DIFFERENTIAL/PLATELET
BASOS ABS: 132 {cells}/uL (ref 0–200)
Basophils Relative: 1.2 %
EOS ABS: 539 {cells}/uL — AB (ref 15–500)
Eosinophils Relative: 4.9 %
HEMATOCRIT: 38.8 % (ref 35.0–45.0)
HEMOGLOBIN: 13 g/dL (ref 11.7–15.5)
LYMPHS ABS: 3443 {cells}/uL (ref 850–3900)
MCH: 29 pg (ref 27.0–33.0)
MCHC: 33.5 g/dL (ref 32.0–36.0)
MCV: 86.6 fL (ref 80.0–100.0)
MPV: 10.9 fL (ref 7.5–12.5)
Monocytes Relative: 6.2 %
Neutro Abs: 6204 cells/uL (ref 1500–7800)
Neutrophils Relative %: 56.4 %
Platelets: 337 10*3/uL (ref 140–400)
RBC: 4.48 10*6/uL (ref 3.80–5.10)
RDW: 12.7 % (ref 11.0–15.0)
Total Lymphocyte: 31.3 %
WBC mixed population: 682 cells/uL (ref 200–950)
WBC: 11 10*3/uL — AB (ref 3.8–10.8)

## 2017-10-28 LAB — COMPLETE METABOLIC PANEL WITH GFR
AG Ratio: 1.8 (calc) (ref 1.0–2.5)
ALBUMIN MSPROF: 4.2 g/dL (ref 3.6–5.1)
ALKALINE PHOSPHATASE (APISO): 95 U/L (ref 33–130)
ALT: 20 U/L (ref 6–29)
AST: 17 U/L (ref 10–35)
BILIRUBIN TOTAL: 0.2 mg/dL (ref 0.2–1.2)
BUN: 13 mg/dL (ref 7–25)
CO2: 26 mmol/L (ref 20–32)
CREATININE: 0.73 mg/dL (ref 0.50–1.05)
Calcium: 9.8 mg/dL (ref 8.6–10.4)
Chloride: 104 mmol/L (ref 98–110)
GFR, EST AFRICAN AMERICAN: 105 mL/min/{1.73_m2} (ref >=60)
GFR, Est Non African American: 91 mL/min/{1.73_m2} (ref >=60)
GLOBULIN: 2.4 g/dL (ref 1.9–3.7)
Glucose, Bld: 90 mg/dL (ref 65–99)
Potassium: 5.1 mmol/L (ref 3.5–5.3)
SODIUM: 140 mmol/L (ref 135–146)
TOTAL PROTEIN: 6.6 g/dL (ref 6.1–8.1)

## 2017-10-28 LAB — TSH: TSH: 1.17 m[IU]/L (ref 0.40–4.50)

## 2017-10-28 LAB — MAGNESIUM: Magnesium: 1.7 mg/dL (ref 1.5–2.5)

## 2017-10-28 MED ORDER — PHENTERMINE HCL 37.5 MG PO TABS: 37.5000 mg | ORAL_TABLET | Freq: Every day | ORAL | 2 refills | Status: DC

## 2017-10-28 MED ORDER — FLUTICASONE PROPIONATE 50 MCG/ACT NA SUSP: 1.0000 | Freq: Every day | NASAL | 2 refills | Status: DC

## 2017-10-28 MED FILL — FLUTICASONE PROP 50 MCG SPR: 50 | 60 days supply | Qty: 16 | Fill #0

## 2017-10-28 MED FILL — PHENTERMINE 37.5 MG TABLET: 37.5 | 30 days supply | Qty: 30 | Fill #0

## 2017-10-28 NOTE — Patient Instructions (Addendum)
Diet soda leads to weight gain.  We recently discovered that the artifical sugar in the soda stops an enzyme in your stomach that is suppose to signal that your brain is full. So patients that drink a lot of diet soda will never feel full and tend to over eat. So please cut back on diet soda and it can help with your weight loss.   Before you even begin to attack a weight-loss plan, it pays to remember this: You are not fat. You have fat. Losing weight isn't about blame or shame; it's simply another achievement to accomplish. Dieting is like any other skill-you have to buckle down and work at it. As long as you act in a smart, reasonable way, you'll ultimately get where you want to be. Here are some weight loss pearls for you.  1. It's Not a Diet. It's a Lifestyle Thinking of a diet as something you're on and suffering through only for the short term doesn't work. To shed weight and keep it off, you need to make permanent changes to the way you eat. It's OK to indulge occasionally, of course, but if you cut calories temporarily and then revert to your old way of eating, you'll gain back the weight quicker than you can say yo-yo. Use it to lose it. Research shows that one of the best predictors of long-term weight loss is how many pounds you drop in the first month. For that reason, nutritionists often suggest being stricter for the first two weeks of your new eating strategy to build momentum. Cut out added sugar and alcohol and avoid unrefined carbs. After that, figure out how you can reincorporate them in a way that's healthy and maintainable.  2. There's a Right Way to Exercise Working out burns calories and fat and boosts your metabolism by building muscle. But those trying to lose weight are notorious for overestimating the number of calories they burn and underestimating the amount they take in. Unfortunately, your system is biologically programmed to hold on to extra pounds and that means when you  start exercising, your body senses the deficit and ramps up its hunger signals. If you're not diligent, you'll eat everything you burn and then some. Use it to lose it. Cardio gets all the exercise glory, but strength and interval training are the real heroes. They help you build lean muscle, which in turn increases your metabolism and calorie-burning ability 3. Don't Overreact to Mild Hunger Some people have a hard time losing weight because of hunger anxiety. To them, being hungry is bad-something to be avoided at all costs-so they carry snacks with them and eat when they don't need to. Others eat because they're stressed out or bored. While you never want to get to the point of being ravenous (that's when bingeing is likely to happen), a hunger pang, a craving, or the fact that it's 3:00 p.m. should not send you racing for the vending machine or obsessing about the energy bar in your purse. Ideally, you should put off eating until your stomach is growling and it's difficult to concentrate.  Use it to lose it. When you feel the urge to eat, use the HALT method. Ask yourself, Am I really hungry? Or am I angry or anxious, lonely or bored, or tired? If you're still not certain, try the apple test. If you're truly hungry, an apple should seem delicious; if it doesn't, something else is going on. Or you can try drinking water and making yourself  busy, if you are still hungry try a healthy snack.  4. Not All Calories Are Created Equal The mechanics of weight loss are pretty simple: Take in fewer calories than you use for energy. But the kind of food you eat makes all the difference. Processed food that's high in saturated fat and refined starch or sugar can cause inflammation that disrupts the hormone signals that tell your brain you're full. The result: You eat a lot more.  Use it to lose it. Clean up your diet. Swap in whole, unprocessed foods, including vegetables, lean protein, and healthy fats that will fill  you up and give you the biggest nutritional bang for your calorie buck. In a few weeks, as your brain starts receiving regular hunger and fullness signals once again, you'll notice that you feel less hungry overall and naturally start cutting back on the amount you eat.  5. Protein, Produce, and Plant-Based Fats Are Your Weight-Loss Trinity Here's why eating the three Ps regularly will help you drop pounds. Protein fills you up. You need it to build lean muscle, which keeps your metabolism humming so that you can torch more fat. People in a weight-loss program who ate double the recommended daily allowance for protein (about 110 grams for a 150-pound woman) lost 70 percent of their weight from fat, while people who ate the RDA lost only about 40 percent, one study found. Produce is packed with filling fiber. "It's very difficult to consume too many calories if you're eating a lot of vegetables. Example: Three cups of broccoli is a lot of food, yet only 93 calories. (Fruit is another story. It can be easy to overeat and can contain a lot of calories from sugar, so be sure to monitor your intake.) Plant-based fats like olive oil and those in avocados and nuts are healthy and extra satiating.  Use it to lose it. Aim to incorporate each of the three Ps into every meal and snack. People who eat protein throughout the day are able to keep weight off, according to a study in the American Journal of Clinical Nutrition. In addition to meat, poultry and seafood, good sources are beans, lentils, eggs, tofu, and yogurt. As for fat, keep portion sizes in check by measuring out salad dressing, oil, and nut butters (shoot for one to two tablespoons). Finally, eat veggies or a little fruit at every meal. People who did that consumed 308 fewer calories but didn't feel any hungrier than when they didn't eat more produce.  7. How You Eat Is As Important As What You Eat In order for your brain to register that you're full, you  need to focus on what you're eating. Sit down whenever you eat, preferably at a table. Turn off the TV or computer, put down your phone, and look at your food. Smell it. Chew slowly, and don't put another bite on your fork until you swallow. When women ate lunch this attentively, they consumed 30 percent less when snacking later than those who listened to an audiobook at lunchtime, according to a study in the [place] Journal of Nutrition. 8. Weighing Yourself Really Works The scale provides the best evidence about whether your efforts are paying off. Seeing the numbers tick up or down or stagnate is motivation to keep going-or to rethink your approach. A 2015 study at [facility] found that daily weigh-ins helped people lose more weight, keep it off, and maintain that loss, even after two years. Use it to lose it.  Step on the scale at the same time every day for the best results. If your weight shoots up several pounds from one weigh-in to the next, don't freak out. Eating a lot of salt the night before or having your period is the likely culprit. The number should return to normal in a day or two. It's a steady climb that you need to do something about. 9. Too Much Stress and Too Little Sleep Are Your Enemies When you're tired and frazzled, your body cranks up the production of cortisol, the stress hormone that can cause carb cravings. Not getting enough sleep also boosts your levels of ghrelin, a hormone associated with hunger, while suppressing leptin, a hormone that signals fullness and satiety. People on a diet who slept only five and a half hours a night for two weeks lost 55 percent less fat and were hungrier than those who slept eight and a half hours, according to a study in the [place] [employer] Journal. Use it to lose it. Prioritize sleep, aiming for seven hours or more a night, which research shows helps lower stress. And make sure you're getting quality zzz's. If a snoring spouse  or a fidgety cat wakes you up frequently throughout the night, you may end up getting the equivalent of just four hours of sleep, according to a study from [facility]. Keep pets out of the bedroom, and use a white-noise app to drown out snoring. 10. You Will Hit a plateau-And You Can Bust Through It As you slim down, your body releases much less leptin, the fullness hormone.  If you're not strength training, start right now. Building muscle can raise your metabolism to help you overcome a plateau. To keep your body challenged and burning calories, incorporate new moves and more intense intervals into your workouts or add another sweat session to your weekly routine. Alternatively, cut an extra 100 calories or so a day from your diet. Now that you've lost weight, your body simply doesn't need as much fuel.    Phentermine  While taking the medication we may ask that you come into the office once a month for the first month for a blood pressure check and EKG.   Also please bring in a food log for that visit to review. It is helpful if you bring in a food diary or use an app on your phone such as myfitnesspal to record your calorie intake, especially in the beginning. BRING FOR YOUR FIRST VISIT.  After that first initial visit, we will want to see you once every 2-3 months to monitor your weight, blood pressure, and heart rate.   In addition we can help answer your questions about diet, exercise, and help you every step of the way with your weight loss journey.  You can start out on 1/3 to 1/2 a pill in the morning and if you are tolerating it well you can increase to one pill daily. I also have some patients that take 1/3 or 1/2 at lunch to help prevent night time eating.  This medication is cheapest CASH pay at [facility] OR [employer] OR [employer] is 14-17 dollars and you do NOT need a membership to get meds from there.   It causes dry mouth and constipation in almost every patient, so try to  get 80-100 oz of water a day and increase fiber such as veggies. You can add on a stool softener if you would like.   It can give you energy  however it can also cause some people to be shaky, anxious or have palpitations. Stop this medication if that happens and contact the office.   If this medication does not work for you there are several medications that we can try to help rewire your brain in addition to making healthier habits.   What is this medicine? PHENTERMINE (FEN ter meen) decreases your appetite. This medicine is intended to be used in addition to a healthy reduced calorie diet and exercise. The best results are achieved this way. This medicine is only indicated for short-term use. Eventually your weight loss may level out and the medication will no longer be needed.   How should I use this medicine? Take this medicine by mouth. Follow the directions on the prescription label. The tablets should stay in the bottle until immediately before you take your dose. Take your doses at regular intervals. Do not take your medicine more often than directed.  Overdosage: If you think you have taken too much of this medicine contact a poison control center or emergency room at once. NOTE: This medicine is only for you. Do not share this medicine with others.  What if I miss a dose? If you miss a dose, take it as soon as you can. If it is almost time for your next dose, take only that dose. Do not take double or extra doses. Do not increase or in any way change your dose without consulting your doctor.  What should I watch for while using this medicine? Notify your physician immediately if you become short of breath while doing your normal activities. Do not take this medicine within 6 hours of bedtime. It can keep you from getting to sleep. Avoid drinks that contain caffeine and try to stick to a regular bedtime every night. Do not stand or sit up quickly, especially if you are an older patient.  This reduces the risk of dizzy or fainting spells. Avoid alcoholic drinks.  What side effects may I notice from receiving this medicine? Side effects that you should report to your doctor or health care professional as soon as possible: -chest pain, palpitations -depression or severe changes in mood -increased blood pressure -irritability -nervousness or restlessness -severe dizziness -shortness of breath -problems urinating -unusual swelling of the legs -vomiting  Side effects that usually do not require medical attention (report to your doctor or health care professional if they continue or are bothersome): -blurred vision or other eye problems -changes in sexual ability or desire -constipation or diarrhea -difficulty sleeping -dry mouth or unpleasant taste -headache -nausea This list may not describe all possible side effects. Call your doctor for medical advice about side effects. You may report side effects to FDA at 1-800-FDA-1088.

## 2017-10-31 ENCOUNTER — Other Ambulatory Visit: Payer: Self-pay | Admitting: Physician Assistant

## 2017-11-01 ENCOUNTER — Telehealth (INDEPENDENT_AMBULATORY_CARE_PROVIDER_SITE_OTHER): Payer: Self-pay

## 2017-11-01 MED FILL — PROPRANOLOL HCL 80 MG TAB: 80 | 90 days supply | Qty: 180 | Fill #0

## 2017-11-01 NOTE — Telephone Encounter (Signed)
Patient asked me to give to Norma Sanchez

## 2017-11-01 NOTE — Telephone Encounter (Signed)
That will be fine. 

## 2017-11-01 NOTE — Telephone Encounter (Signed)
Patient would like to know if she could get renewal for her handicap placard. Please call to advise. Thanks.

## 2017-11-01 NOTE — Telephone Encounter (Signed)
Please advise 

## 2017-11-22 ENCOUNTER — Other Ambulatory Visit: Payer: Self-pay | Admitting: Physician Assistant

## 2017-11-22 ENCOUNTER — Other Ambulatory Visit: Payer: Self-pay | Admitting: Internal Medicine

## 2017-11-23 MED FILL — CANDESARTAN CILEXETIL 32 MG: 32 | 90 days supply | Qty: 90 | Fill #0

## 2017-11-23 MED FILL — ESTRADIOL 1 MG TABLET: 1 | 90 days supply | Qty: 90 | Fill #0

## 2017-11-23 MED FILL — aMILoride HCL 5 MG TABS: 5 | 90 days supply | Qty: 90 | Fill #0

## 2017-11-28 ENCOUNTER — Other Ambulatory Visit: Payer: Self-pay | Admitting: Physician Assistant

## 2017-11-29 MED FILL — SYNTHROID 137 MCG TABLET: 137 | 90 days supply | Qty: 90 | Fill #0

## 2017-12-13 DIAGNOSIS — Z1231 Encounter for screening mammogram for malignant neoplasm of breast: Secondary | ICD-10-CM | POA: Diagnosis not present

## 2017-12-13 DIAGNOSIS — Z803 Family history of malignant neoplasm of breast: Secondary | ICD-10-CM | POA: Diagnosis not present

## 2017-12-13 LAB — HM MAMMOGRAPHY

## 2017-12-14 MED FILL — ESOMEPRAZOLE MAG DR 40 MG C: 40 | 90 days supply | Qty: 90 | Fill #3

## 2017-12-19 ENCOUNTER — Other Ambulatory Visit: Payer: Self-pay | Admitting: Physician Assistant

## 2017-12-19 MED FILL — ESCITALOPRAM 20 MG TABLET: 20 | 90 days supply | Qty: 90 | Fill #1

## 2017-12-19 MED FILL — metFORMIN HCL ER 500 MG TB2: 500 | 90 days supply | Qty: 360 | Fill #0

## 2017-12-27 ENCOUNTER — Encounter: Payer: Self-pay | Admitting: *Deleted

## 2018-01-25 ENCOUNTER — Other Ambulatory Visit: Payer: Self-pay | Admitting: Physician Assistant

## 2018-01-25 DIAGNOSIS — R05 Cough: Secondary | ICD-10-CM

## 2018-01-25 DIAGNOSIS — R059 Cough, unspecified: Secondary | ICD-10-CM

## 2018-01-25 DIAGNOSIS — Z0001 Encounter for general adult medical examination with abnormal findings: Secondary | ICD-10-CM

## 2018-01-25 MED ORDER — AZITHROMYCIN 250 MG PO TABS: ORAL_TABLET | ORAL | 1 refills | Status: AC

## 2018-01-25 MED ORDER — FLUTICASONE PROPIONATE 50 MCG/ACT NA SUSP: 1.0000 | Freq: Every day | NASAL | 2 refills | Status: DC

## 2018-01-25 MED ORDER — ALBUTEROL SULFATE HFA 108 (90 BASE) MCG/ACT IN AERS: 2.0000 | INHALATION_SPRAY | Freq: Four times a day (QID) | RESPIRATORY_TRACT | 2 refills | Status: DC | PRN

## 2018-01-25 MED ORDER — PROMETHAZINE-DM 6.25-15 MG/5ML PO SYRP: 5.0000 mL | ORAL_SOLUTION | Freq: Four times a day (QID) | ORAL | 1 refills | Status: DC | PRN

## 2018-01-25 MED FILL — VENTOLIN HFA 90 MCG INHALER: 108 (90 BAS | 25 days supply | Qty: 18 | Fill #0

## 2018-01-25 MED FILL — AZITHROMYCIN 250 MG TABLET: 250 | 5 days supply | Qty: 6 | Fill #0

## 2018-01-25 MED FILL — FLUTICASONE PROP 50 MCG SPR: 50 | 90 days supply | Qty: 48 | Fill #0

## 2018-01-25 MED FILL — PROMETHAZINE W/DM SYRUP: 6.25-15 | 12 days supply | Qty: 240 | Fill #0

## 2018-02-06 MED FILL — PROPRANOLOL HCL 80 MG TAB: 80 | 90 days supply | Qty: 180 | Fill #1

## 2018-02-06 MED FILL — ATORVASTATIN CALCIUM 20 MG: 20 | 90 days supply | Qty: 90 | Fill #1

## 2018-02-16 ENCOUNTER — Encounter: Payer: Self-pay | Admitting: Physician Assistant

## 2018-02-20 MED FILL — ESTRADIOL 1 MG TABLET: 1 | 90 days supply | Qty: 90 | Fill #1

## 2018-02-20 MED FILL — aMILoride HCL 5 MG TABS: 5 | 90 days supply | Qty: 90 | Fill #1

## 2018-02-20 MED FILL — SYNTHROID 137 MCG TABLET: 137 | 90 days supply | Qty: 90 | Fill #1

## 2018-02-23 NOTE — Progress Notes (Signed)
Complete Physical  Assessment and Plan: Essential hypertension - continue medications, DASH diet, exercise and monitor at home. Call if greater than 130/80.  - CBC with Differential/Platelet - BASIC METABOLIC PANEL WITH GFR - Hepatic function panel - Urinalysis, Routine w reflex microscopic ([facility]) - Microalbumin / creatinine urine ratio - EKG 12-Lead  Hypothyroidism, unspecified hypothyroidism type Hypothyroidism-check TSH level, continue medications the same, reminded to take on an empty stomach 30-7460mins before food.  - TSH  Morbid obesity, unspecified obesity type (HCC) Obesity with co morbidities- long discussion about weight loss, diet, and exercise - follow up 3 months for progress monitoring - increase veggies, decrease carbs - long discussion about weight loss, diet, and exercise  High cholesterol -continue medications, check lipids, decrease fatty foods, increase activity.  - Lipid panel   Medication management - Magnesium   Hot flashes, menopausal versus hyperhidrosis Will try oxybutynin as needed Continue lexapro, estrogen, on bASA   Vitamin D deficiency - VITAMIN D 25 Hydroxy (Vit-D Deficiency, Fractures)  Gastroesophageal reflux disease with esophagitis Continue PPI/H2 blocker, diet discussed  Biliary dyskinesia Continue PPI/H2 blocker, diet discussed   Encounter for general adult medical examination with abnormal findings  BMI 45.0-49.9, adult (HCC). - follow up 3 months for progress monitoring - increase veggies, decrease carbs - long discussion about weight loss, diet, and exercise  Screening, anemia, deficiency, iron -     Iron,Total/Total Iron Binding Cap -     Vitamin B12   Discussed med's effects and SE's. Screening labs and tests as requested with regular follow-up as recommended. Future Appointments  Date Time Provider Department Center  03/09/2019  9:30 AM Quentin Mullingollier, Naveh Rickles, PA-C [id number] None    HPI  58 y.o. female  presents  for a complete physical.    Her blood pressure has been controlled at home, today their BP is BP: 134/82 She does not workout due to time restraints.  She denies chest pain, shortness of breath, dizziness.  She has been working 13-14 hour days.  She continue to have knee pain.  She is on cholesterol medication, lipitor 20 1/2 pill daily and denies myalgias. Her cholesterol is at goal. The cholesterol last visit was:   Lab Results  Component Value Date   CHOL 133 10/28/2017   HDL 46 (L) 10/28/2017   LDLCALC 67 10/28/2017   TRIG 112 10/28/2017   CHOLHDL 2.9 10/28/2017    She has been working on diet and exercise for prediabetes, she is on 4 pill a day, and denies paresthesia of the feet, polydipsia, polyuria and visual disturbances. Last A1C in the office was:  Lab Results  Component Value Date   HGBA1C 5.4 02/06/2016   Patient is on Vitamin D supplement, on 1000 IU daily Lab Results  Component Value Date   VD25OH 6855 02/08/2017    She has hot flashes, is on lexapro, low dose estrogen and bASA- states she is still sweating. States from her chest up will get sweating, has happened "forever" prior to menopause even, would like to try an as needed.   She is on thyroid medication. Her medication was not changed last visit.   Lab Results  Component Value Date   TSH 1.17 10/28/2017  .  BMI is Body mass index is 45.91 kg/m., she is struggling with weight loss due to stress, time limitations.  Works 3rd shift, gets up 330-400. Has not been taking the phentermine due to sweating.  Wt Readings from Last 3 Encounters:  02/24/18 243 lb (  110.2 kg)  10/28/17 257 lb (116.6 kg)  06/21/17 251 lb (113.9 kg)    Current Medications:  Current Outpatient Medications on File Prior to Visit  Medication Sig Dispense Refill  . albuterol (PROVENTIL HFA;VENTOLIN HFA) 108 (90 Base) MCG/ACT inhaler Inhale 2 puffs into the lungs every 6 (six) hours as needed for wheezing or shortness of breath. 18 g 2  .  aMILoride (MIDAMOR) 5 MG tablet TAKE 1 TABLET BY MOUTH EVERY MORNING. 90 tablet 1  . Ascorbic Acid (VITAMIN C) 1000 MG tablet Take 1,000 mg by mouth daily.    Marland Kitchen aspirin 325 MG EC tablet Take 325 mg by mouth every morning.     Marland Kitchen atorvastatin (LIPITOR) 20 MG tablet Take 1/2 to 1 tablet daily as directed. 90 tablet 3  . candesartan (ATACAND) 32 MG tablet TAKE 1/2 TO 1 TABLET BY MOUTH EVERY NIGHT AS DIRECTED FOR BLOOD PRESSURE 90 tablet 1  . cetirizine (ZYRTEC) 10 MG tablet Take 10 mg by mouth daily.    . Cholecalciferol (VITAMIN D-3) 5000 UNITS TABS Take 5,000 Units by mouth daily.    . cyanocobalamin 2000 MCG tablet Take 2,000 mcg by mouth daily.    Marland Kitchen EPINEPHRINE 0.3 mg/0.3 mL IJ SOAJ injection USE AS DIRECTED 2 Device PRN  . escitalopram (LEXAPRO) 20 MG tablet TAKE 1 TABLET BY MOUTH ONCE DAILY 90 tablet 3  . esomeprazole (NEXIUM) 40 MG capsule TAKE 1 CAPSULE BY MOUTH DAILY. 90 capsule 3  . estradiol (ESTRACE) 1 MG tablet TAKE 1/2 TO 1 TABLET BY MOUTH ONCE DAILY FOR VASOMOTOR SYMPTOMS 90 tablet 1  . famciclovir (FAMVIR) 500 MG tablet Take 500 mg by mouth 3 (three) times daily. Take AD for fever blister    . fluticasone (FLONASE) 50 MCG/ACT nasal spray Place 1 spray into both nostrils daily. 48 g 2  . Glucosamine-Chondroitin (GLUCOSAMINE CHONDR COMPLEX PO) Take 1,500 mg by mouth. 1500mg /1200mg   Actual dose    . metFORMIN (GLUCOPHAGE-XR) 500 MG 24 hr tablet TAKE 2 TABLETS BY MOUTH TWICE DAILY 360 tablet PRN  . phentermine (ADIPEX-P) 37.5 MG tablet Take 1 tablet (37.5 mg total) by mouth daily before breakfast. 30 tablet 2  . promethazine-dextromethorphan (PROMETHAZINE-DM) 6.25-15 MG/5ML syrup Take 5 mLs by mouth 4 (four) times daily as needed for cough. 240 mL 1  . propranolol (INDERAL) 80 MG tablet Take 1 tablet 2 x /day for BP 180 tablet 1  . SYNTHROID 137 MCG tablet TAKE 1 TABLET BY MOUTH ONCE A DAY BEFORE BREAKFAST. 90 tablet 1  . traMADol (ULTRAM) 50 MG tablet Take 1 tablet (50 mg total) by mouth  every 12 (twelve) hours as needed (cough). 30 tablet 0   No current facility-administered medications on file prior to visit.    Health Maintenance:   Immunization History  Administered Date(s) Administered  . DTaP 12/07/2012  . Influenza-Unspecified 11/22/2012, 11/12/2014  . Pneumococcal Polysaccharide-23 02/22/2009  . Td 02/23/2000  . Tdap 12/07/2012  . Zoster 10/29/2010   Tetanus: 2014 Pneumovax: 2011 Flu vaccine: 10/2016 at [facility] Prevnar 13: N/A Zostavax:2012  LMP: 2005 p. Hysterectomy, 1 ovary and cervix Pap: 11/2013,  + stress incontinence due 2020  MGM: 11/2017 category A,  DEXA: N/A TB gold- negative Colonoscopy: 04/2014, Dr. Madilyn Fireman.  EGD: N/A CT AB 2003 CXR 12/2012 US thyroid and BX 2013, Dr. Sharl Ma.  Last Dental Exam: Dr. Dan Humphreys Last Eye Exam: Dr. Charise Killian   Patient Care Team: Lucky Cowboy, MD as PCP - General (Internal Medicine) Dorena Cookey, MD (Inactive)  as Consulting Physician (Gastroenterology) Talmage CoinKerr, Jeffrey, MD as Consulting Physician (Endocrinology)  Medical History:  Past Medical History:  Diagnosis Date  . GERD (gastroesophageal reflux disease)   . High cholesterol   . Hypertension   . Hypothyroidism   . Multinodular goiter    Allergies Allergies  Allergen Reactions  . Avelox [Moxifloxacin Hcl In Nacl] Hives    Thrush, throat might have closed   . Hydrochlorothiazide Itching    On hands and feet  . Other Itching    All "Cillins", hives alao  . Penicillins     Hives  . Prednisone     Chest pains  . Zostavax [Zoster Vaccine Live]   . Codeine Hives, Itching and Rash  . Levothyroxine Rash    Only to generic. Not to brand synthroid.   . Sulfa Antibiotics Itching and Rash    Hands and feet itch and turn red    SURGICAL HISTORY She  has a past surgical history that includes Carpal tunnel release (yrs ago); Knee surgery (arthroscopic, 3 yrs ago); Tonsillectomy (age 58); Abdominal hysterectomy (8 yrs ago); Tubal ligation (yrs ago);  radioactive iodine to thyroid' (march 2013); and Cholecystectomy (12/28/2011). FAMILY HISTORY Her family history includes Cancer in her mother; Heart attack in her father; Stroke in her father and mother. SOCIAL HISTORY She  reports that she has never smoked. She has never used smokeless tobacco. She reports that she does not drink alcohol or use drugs.  Review of Systems  Constitutional: Negative.  Negative for chills and fever.  HENT: Negative.   Eyes: Negative.   Respiratory: Negative.  Negative for shortness of breath.   Cardiovascular: Negative.  Negative for chest pain.  Gastrointestinal: Negative.   Genitourinary: Negative for dysuria, flank pain, frequency, hematuria and urgency.  Musculoskeletal: Positive for joint pain. Negative for back pain, falls, myalgias and neck pain.  Skin: Negative for rash.  Neurological: Negative.   Endo/Heme/Allergies: Negative.   Psychiatric/Behavioral: Negative.  Negative for depression. The patient does not have insomnia.      Physical Exam: Estimated body mass index is 45.91 kg/m as calculated from the following:   Height as of this encounter: 5\' 1"  (1.549 m).   Weight as of this encounter: 243 lb (110.2 kg). BP 134/82   Pulse 63   Temp 98.2 F (36.8 C)   Resp 14   Ht 5\' 1"  (1.549 m)   Wt 243 lb (110.2 kg)   SpO2 95%   BMI 45.91 kg/m  General Appearance: Well nourished, in no apparent distress.  Eyes: PERRLA, EOMs, conjunctiva no swelling or erythema, normal fundi and vessels.  Sinuses: No Frontal/maxillary tenderness  ENT/Mouth: Ext aud canals clear, normal light reflex with TMs without erythema, bulging. Good dentition. No erythema, swelling, or exudate on post pharynx. Tonsils not swollen or erythematous. Hearing normal.  Neck: Supple, thyroid normal. No bruits  Respiratory: Respiratory effort normal, BS equal bilaterally without rales, rhonchi, wheezing or stridor.  Cardio: RRR without murmurs, rubs or gallops. Brisk peripheral  pulses without edema.  Chest: symmetric, with normal excursions and percussion.  Breasts: Symmetric, without lumps, nipple discharge, retractions. Abdomen: Soft, nontender, obese, no guarding, rebound, hernias, masses, or organomegaly. .  Lymphatics: Non tender without lymphadenopathy.  Genitourinary: defer Musculoskeletal: Full ROM all peripheral extremities,5/5 strength, and normal gait.  Skin: Warm, dry without rashes, lesions, ecchymosis. Neuro: Cranial nerves intact, reflexes equal bilaterally. Normal muscle tone, no cerebellar symptoms. Sensation intact.  Psych: Awake and oriented X 3, normal affect, Insight and  Judgment appropriate.   EKG: WNL no ST changes. AORTA SCAN: defer   Quentin Mulling 9:36 AM [facility]

## 2018-02-24 ENCOUNTER — Encounter: Payer: Self-pay | Admitting: Physician Assistant

## 2018-02-24 ENCOUNTER — Other Ambulatory Visit (HOSPITAL_COMMUNITY)
Admission: RE | Admit: 2018-02-24 | Discharge: 2018-02-24 | Disposition: A | Discharge status: Home / Self Care | Payer: 59 | Source: Ambulatory Visit | Attending: Physician Assistant | Admitting: Physician Assistant

## 2018-02-24 ENCOUNTER — Ambulatory Visit (INDEPENDENT_AMBULATORY_CARE_PROVIDER_SITE_OTHER): Payer: 59 | Admitting: Physician Assistant

## 2018-02-24 VITALS — BP 134/82 | HR 63 | Temp 98.2°F | Resp 14 | Ht 61.0 in | Wt 243.0 lb

## 2018-02-24 DIAGNOSIS — R7303 Prediabetes: Secondary | ICD-10-CM | POA: Diagnosis not present

## 2018-02-24 DIAGNOSIS — Z130 Encounter for screening for diseases of the blood and blood-forming organs and certain disorders involving the immune mechanism: Secondary | ICD-10-CM | POA: Diagnosis not present

## 2018-02-24 DIAGNOSIS — Z124 Encounter for screening for malignant neoplasm of cervix: Secondary | ICD-10-CM | POA: Insufficient documentation

## 2018-02-24 DIAGNOSIS — K2100 Gastro-esophageal reflux disease with esophagitis, without bleeding: Secondary | ICD-10-CM

## 2018-02-24 DIAGNOSIS — Z136 Encounter for screening for cardiovascular disorders: Secondary | ICD-10-CM

## 2018-02-24 DIAGNOSIS — R61 Generalized hyperhidrosis: Secondary | ICD-10-CM

## 2018-02-24 DIAGNOSIS — Z0001 Encounter for general adult medical examination with abnormal findings: Secondary | ICD-10-CM

## 2018-02-24 DIAGNOSIS — E559 Vitamin D deficiency, unspecified: Secondary | ICD-10-CM | POA: Diagnosis not present

## 2018-02-24 DIAGNOSIS — E7800 Pure hypercholesterolemia, unspecified: Secondary | ICD-10-CM

## 2018-02-24 DIAGNOSIS — E039 Hypothyroidism, unspecified: Secondary | ICD-10-CM

## 2018-02-24 DIAGNOSIS — I10 Essential (primary) hypertension: Secondary | ICD-10-CM | POA: Diagnosis not present

## 2018-02-24 DIAGNOSIS — K828 Other specified diseases of gallbladder: Secondary | ICD-10-CM

## 2018-02-24 DIAGNOSIS — Z79899 Other long term (current) drug therapy: Secondary | ICD-10-CM

## 2018-02-24 DIAGNOSIS — Z0000 Encounter for general adult medical examination without abnormal findings: Secondary | ICD-10-CM

## 2018-02-24 MED ORDER — OXYBUTYNIN CHLORIDE 5 MG PO TABS: 5.0000 mg | ORAL_TABLET | Freq: Two times a day (BID) | ORAL | 0 refills | Status: DC | PRN

## 2018-02-24 MED FILL — OXYBUTYNIN 5 MG TABLET: 5 | 30 days supply | Qty: 60 | Fill #0

## 2018-02-24 NOTE — Patient Instructions (Addendum)
Check out  Mini habits for weight loss book  2 apps for tracking food is myfitness pal  loseit OR can take picture of your food   Google mindful eating and here are some tips and tricks below.   Rate your hunger before you eat on a scale of 1-10, try to eat closer to a 6 or higher. And if you are at below that, why are you eating? Slow down and listen to your body.      GENERAL HEALTH GOALS  Know what a healthy weight is for you (roughly BMI <25) and aim to maintain this  Aim for 7+ servings of fruits and vegetables daily  70-80+ fluid ounces of water or unsweet tea for healthy kidneys  Limit to max 1 drink of alcohol per day; avoid smoking/tobacco  Limit animal fats in diet for cholesterol and heart health - choose grass fed whenever available  Avoid highly processed foods, and foods high in saturated/trans fats  Aim for low stress - take time to unwind and care for your mental health  Aim for 150 min of moderate intensity exercise weekly for heart health, and weights twice weekly for bone health  Aim for 7-9 hours of sleep daily

## 2018-02-25 LAB — LIPID PANEL
Cholesterol: 153 mg/dL (ref <200)
HDL: 55 mg/dL (ref >50)
LDL Cholesterol (Calc): 74 mg/dL (calc)
NON-HDL CHOLESTEROL (CALC): 98 mg/dL (ref <130)
Total CHOL/HDL Ratio: 2.8 (calc) (ref <5.0)
Triglycerides: 163 mg/dL — ABNORMAL HIGH (ref <150)

## 2018-02-25 LAB — URINALYSIS, ROUTINE W REFLEX MICROSCOPIC
Bilirubin Urine: NEGATIVE
Glucose, UA: NEGATIVE
Hgb urine dipstick: NEGATIVE
Hyaline Cast: NONE SEEN /LPF
KETONES UR: NEGATIVE
Nitrite: NEGATIVE
Protein, ur: NEGATIVE
RBC / HPF: NONE SEEN /HPF (ref 0–2)
Specific Gravity, Urine: 1.018 (ref 1.001–1.03)
pH: 5.5 (ref 5.0–8.0)

## 2018-02-25 LAB — COMPLETE METABOLIC PANEL WITH GFR
AG Ratio: 1.7 (calc) (ref 1.0–2.5)
ALT: 15 U/L (ref 6–29)
AST: 15 U/L (ref 10–35)
Albumin: 4.3 g/dL (ref 3.6–5.1)
Alkaline phosphatase (APISO): 86 U/L (ref 33–130)
BUN: 12 mg/dL (ref 7–25)
CALCIUM: 9.8 mg/dL (ref 8.6–10.4)
CO2: 26 mmol/L (ref 20–32)
Chloride: 104 mmol/L (ref 98–110)
Creat: 0.64 mg/dL (ref 0.50–1.05)
GFR, EST NON AFRICAN AMERICAN: 98 mL/min/{1.73_m2} (ref >=60)
GFR, Est African American: 114 mL/min/{1.73_m2} (ref >=60)
Globulin: 2.6 g/dL (calc) (ref 1.9–3.7)
Glucose, Bld: 80 mg/dL (ref 65–99)
Potassium: 4.8 mmol/L (ref 3.5–5.3)
Sodium: 139 mmol/L (ref 135–146)
Total Bilirubin: 0.4 mg/dL (ref 0.2–1.2)
Total Protein: 6.9 g/dL (ref 6.1–8.1)

## 2018-02-25 LAB — MICROALBUMIN / CREATININE URINE RATIO
Creatinine, Urine: 89 mg/dL (ref 20–275)
Microalb Creat Ratio: 9 mcg/mg creat (ref <30)
Microalb, Ur: 0.8 mg/dL

## 2018-02-25 LAB — CBC WITH DIFFERENTIAL/PLATELET
ABSOLUTE MONOCYTES: 556 {cells}/uL (ref 200–950)
BASOS ABS: 93 {cells}/uL (ref 0–200)
Basophils Relative: 0.9 %
EOS ABS: 515 {cells}/uL — AB (ref 15–500)
Eosinophils Relative: 5 %
HEMATOCRIT: 40.8 % (ref 35.0–45.0)
Hemoglobin: 13.6 g/dL (ref 11.7–15.5)
Lymphs Abs: 2657 cells/uL (ref 850–3900)
MCH: 28.9 pg (ref 27.0–33.0)
MCHC: 33.3 g/dL (ref 32.0–36.0)
MCV: 86.8 fL (ref 80.0–100.0)
MPV: 11 fL (ref 7.5–12.5)
Monocytes Relative: 5.4 %
Neutro Abs: 6479 cells/uL (ref 1500–7800)
Neutrophils Relative %: 62.9 %
Platelets: 332 10*3/uL (ref 140–400)
RBC: 4.7 10*6/uL (ref 3.80–5.10)
RDW: 12.6 % (ref 11.0–15.0)
Total Lymphocyte: 25.8 %
WBC: 10.3 10*3/uL (ref 3.8–10.8)

## 2018-02-25 LAB — IRON, TOTAL/TOTAL IRON BINDING CAP
%SAT: 27 % (calc) (ref 16–45)
Iron: 89 ug/dL (ref 45–160)
TIBC: 328 mcg/dL (calc) (ref 250–450)

## 2018-02-25 LAB — VITAMIN B12: Vitamin B-12: 505 pg/mL (ref 200–1100)

## 2018-02-25 LAB — HEMOGLOBIN A1C
Hgb A1c MFr Bld: 5.8 % of total Hgb — ABNORMAL HIGH (ref <5.7)
Mean Plasma Glucose: 120 (calc)
eAG (mmol/L): 6.6 (calc)

## 2018-02-25 LAB — MAGNESIUM: Magnesium: 1.6 mg/dL (ref 1.5–2.5)

## 2018-02-25 LAB — VITAMIN D 25 HYDROXY (VIT D DEFICIENCY, FRACTURES): Vit D, 25-Hydroxy: 50 ng/mL (ref 30–100)

## 2018-02-25 LAB — TSH: TSH: 2.06 mIU/L (ref 0.40–4.50)

## 2018-02-28 LAB — CYTOLOGY - PAP
ADEQUACY: ABSENT
Diagnosis: NEGATIVE
HPV: NOT DETECTED

## 2018-03-13 ENCOUNTER — Other Ambulatory Visit: Payer: Self-pay | Admitting: Physician Assistant

## 2018-03-13 MED FILL — ESOMEPRAZOLE MAG DR 40 MG C: 40 | 90 days supply | Qty: 90 | Fill #0

## 2018-03-29 MED FILL — ESCITALOPRAM 20 MG TABLET: 20 | 90 days supply | Qty: 90 | Fill #2 | Status: TO

## 2018-03-29 MED FILL — metFORMIN HCL ER 500 MG TB2: 500 | 90 days supply | Qty: 360 | Fill #1 | Status: TO

## 2018-05-02 ENCOUNTER — Other Ambulatory Visit: Payer: Self-pay | Admitting: Internal Medicine

## 2018-05-03 MED FILL — PROPRANOLOL 80 MG TABLET: 80 | 90 days supply | Qty: 180 | Fill #0

## 2018-05-23 ENCOUNTER — Other Ambulatory Visit: Payer: Self-pay | Admitting: Internal Medicine

## 2018-05-23 ENCOUNTER — Other Ambulatory Visit: Payer: Self-pay | Admitting: Physician Assistant

## 2018-05-23 MED FILL — CANDESARTAN CILEXETIL 32 MG: 32 | 90 days supply | Qty: 90 | Fill #0

## 2018-05-24 MED FILL — SYNTHROID 137 MCG TABLET: 137 | 90 days supply | Qty: 90 | Fill #0

## 2018-05-24 MED FILL — aMILoride HCL 5 MG TABS: 5 | 90 days supply | Qty: 90 | Fill #0

## 2018-05-24 MED FILL — ESTRADIOL 1 MG TABS: 1 | 90 days supply | Qty: 90 | Fill #0

## 2018-06-23 MED FILL — metFORMIN HCL ER 500 MG TB2: 500 | 90 days supply | Qty: 360 | Fill #0

## 2018-06-23 MED FILL — ESCITALOPRAM 20 MG TABLET: 20 | 90 days supply | Qty: 90 | Fill #0

## 2018-06-23 MED FILL — ESOMEPRAZOLE MAG DR 40 MG C: 40 | 90 days supply | Qty: 90 | Fill #0

## 2018-06-28 NOTE — Progress Notes (Signed)
THIS ENCOUNTER IS A VIRTUAL/TELEPHONE VISIT DUE TO COVID-19 - PATIENT WAS NOT SEEN IN THE OFFICE.  PATIENT HAS CONSENTED TO VIRTUAL VISIT / TELEMEDICINE VISIT  This provider placed a call to Norma Sanchez using telephone, her appointment was changed to a virtual office visit to reduce the risk of exposure to the COVID-19 virus and to help Norma Sanchez remain healthy and safe. The virtual visit will also provide continuity of care. She verbalizes understanding.    Assessment and Plan:   Hypertension -Continue medication, monitor blood pressure at home. Continue DASH diet.  Reminder to go to the ER if any CP, SOB, nausea, dizziness, severe HA, changes vision/speech, left arm numbness and tingling and jaw pain.   Cholesterol -Continue diet and exercise.   Vitamin D Def - continue medications.    Morbid Obesity with co morbidities- - follow up 4 months for progress monitoring - increase veggies, decrease carbs - long discussion about weight loss, diet, and exercise  Hypothyroidism -check TSH level, continue medications the same, reminded to take on an empty stomach 30-72mins before food.   WANTS 90 DAY OF MEDICATIONS Every 4 months for visit.  Continue diet and meds as discussed. Further disposition pending results of labs.Over 30 minutes of exam, counseling, chart review, and critical decision making was performed Future Appointments  Date Time Provider Department Center  11/02/2018  9:30 AM Quentin Mulling, PA-C [facility] None  03/09/2019  9:30 AM Quentin Mulling, PA-C [facility] None    HPI 58 y.o. female  presents for 3 month follow up on hypertension, cholesterol, prediabetes, and vitamin D deficiency.   Cutting back to 36 hours a week but elective surgery is opening up next week.    Her blood pressure has been controlled at home, today their BP is BP: 117/72  She does not workout. She denies chest pain, shortness of breath, dizziness.  BMI is Body mass index is 45.91  kg/m.Marland Kitchen She has been increasing water, has not been able to check weight due to no battery in scale. She is not walking due to her knee bothering her. She is cooking at home, trying to cut back on portions. She is not on phentermine at this time but she will take it more AS needed.  Wt Readings from Last 3 Encounters:  02/24/18 243 lb (110.2 kg)  10/28/17 257 lb (116.6 kg)  06/21/17 251 lb (113.9 kg)    She is on cholesterol medication, lipitor 10 pill daily and denies myalgias. Her cholesterol is at goal. The cholesterol last visit was:   Lab Results  Component Value Date   CHOL 153 02/24/2018   HDL 55 02/24/2018   LDLCALC 74 02/24/2018   TRIG 163 (H) 02/24/2018   CHOLHDL 2.8 02/24/2018   She has been working on diet and exercise for prediabetes, and denies paresthesia of the feet, polydipsia, polyuria and visual disturbances.   Last A1C in the office was:  Lab Results  Component Value Date   HGBA1C 5.8 (H) 02/24/2018   Patient is on Vitamin D supplement, 5000 IU dailly.    Lab Results  Component Value Date   VD25OH 49 02/24/2018    She is on thyroid medication. Her medication was not changed last visit.  Lab Results  Component Value Date   TSH 2.06 02/24/2018     Current Medications:  Current Outpatient Medications on File Prior to Visit  Medication Sig Dispense Refill  . albuterol (PROVENTIL HFA;VENTOLIN HFA) 108 (90 Base) MCG/ACT inhaler Inhale  2 puffs into the lungs every 6 (six) hours as needed for wheezing or shortness of breath. 18 g 2  . aMILoride (MIDAMOR) 5 MG tablet TAKE 1 TABLET BY MOUTH EVERY MORNING. 90 tablet 0  . Ascorbic Acid (VITAMIN C) 1000 MG tablet Take 1,000 mg by mouth daily.    Marland Kitchen. aspirin 325 MG EC tablet Take 325 mg by mouth every morning.     Marland Kitchen. atorvastatin (LIPITOR) 20 MG tablet Take 1/2 to 1 tablet daily as directed. 90 tablet 3  . candesartan (ATACAND) 32 MG tablet TAKE 1/2 TO 1 TABLET BY MOUTH EVERY NIGHT AS DIRECTED FOR BLOOD PRESSURE 90 tablet  1  . cetirizine (ZYRTEC) 10 MG tablet Take 10 mg by mouth daily.    . Cholecalciferol (VITAMIN D-3) 5000 UNITS TABS Take 5,000 Units by mouth daily.    . cyanocobalamin 2000 MCG tablet Take 2,000 mcg by mouth daily.    Marland Kitchen. EPINEPHRINE 0.3 mg/0.3 mL IJ SOAJ injection USE AS DIRECTED 2 Device PRN  . escitalopram (LEXAPRO) 20 MG tablet TAKE 1 TABLET BY MOUTH ONCE DAILY 90 tablet 3  . esomeprazole (NEXIUM) 40 MG capsule TAKE 1 CAPSULE BY MOUTH DAILY. 90 capsule 3  . estradiol (ESTRACE) 1 MG tablet TAKE 1/2 TO 1 TABLET BY MOUTH ONCE DAILY FOR VASOMOTOR SYMPTOMS 90 tablet 0  . famciclovir (FAMVIR) 500 MG tablet Take 500 mg by mouth 3 (three) times daily. Take AD for fever blister    . fluticasone (FLONASE) 50 MCG/ACT nasal spray Place 1 spray into both nostrils daily. 48 g 2  . Glucosamine-Chondroitin (GLUCOSAMINE CHONDR COMPLEX PO) Take 1,500 mg by mouth. 1500mg /1200mg   Actual dose    . metFORMIN (GLUCOPHAGE-XR) 500 MG 24 hr tablet TAKE 2 TABLETS BY MOUTH TWICE DAILY 360 tablet PRN  . oxybutynin (DITROPAN) 5 MG tablet Take 1 tablet (5 mg total) by mouth 2 (two) times daily as needed (sweating). 60 tablet 0  . phentermine (ADIPEX-P) 37.5 MG tablet Take 1 tablet (37.5 mg total) by mouth daily before breakfast. 30 tablet 2  . propranolol (INDERAL) 80 MG tablet TAKE 1 TABLET BY MOUTH TWICE DAILY FOR BLOOD PRESSURE 180 tablet 1  . SYNTHROID 137 MCG tablet TAKE 1 TABLET BY MOUTH ONCE A DAY BEFORE BREAKFAST. 90 tablet 0  . traMADol (ULTRAM) 50 MG tablet Take 1 tablet (50 mg total) by mouth every 12 (twelve) hours as needed (cough). 30 tablet 0   No current facility-administered medications on file prior to visit.    Medical History:  Past Medical History:  Diagnosis Date  . GERD (gastroesophageal reflux disease)   . High cholesterol   . Hypertension   . Hypothyroidism   . Multinodular goiter    Allergies:  Allergies  Allergen Reactions  . Avelox [Moxifloxacin Hcl In Nacl] Hives    Thrush, throat  might have closed   . Hydrochlorothiazide Itching    On hands and feet  . Other Itching    All "Cillins", hives alao  . Penicillins     Hives  . Prednisone     Chest pains  . Zostavax [Zoster Vaccine Live]   . Codeine Hives, Itching and Rash  . Levothyroxine Rash    Only to generic. Not to brand synthroid.   . Sulfa Antibiotics Itching and Rash    Hands and feet itch and turn red    Review of Systems:  Review of Systems  Constitutional: Negative.   HENT: Negative.   Eyes: Negative.   Respiratory:  Negative.   Cardiovascular: Negative for chest pain, palpitations, orthopnea, claudication, leg swelling and PND.  Gastrointestinal: Negative.   Genitourinary: Negative.   Musculoskeletal: Positive for joint pain (bilateral knees).  Skin: Negative.   Neurological: Negative.   Endo/Heme/Allergies: Negative.   Psychiatric/Behavioral: Negative.     Family history- Review and unchanged Social history- Review and unchanged Physical Exam: BP 117/72   Temp 98 F (36.7 C)   Ht 5\' 1"  (1.549 m)   BMI 45.91 kg/m  Wt Readings from Last 3 Encounters:  02/24/18 243 lb (110.2 kg)  10/28/17 257 lb (116.6 kg)  06/21/17 251 lb (113.9 kg)   General Appearance:Well sounding, in no apparent distress.  ENT/Mouth: No hoarseness, No cough for duration of visit.  Respiratory: completing full sentences without distress, without audible wheeze Neuro: Awake and oriented X 3,  Psych:  Insight and Judgment appropriate.     Quentin Mulling, PA-C  9:47 AM [facility]

## 2018-06-29 ENCOUNTER — Encounter: Payer: Self-pay | Admitting: Physician Assistant

## 2018-06-29 ENCOUNTER — Ambulatory Visit: Payer: 59 | Admitting: Physician Assistant

## 2018-06-29 ENCOUNTER — Other Ambulatory Visit: Payer: Self-pay

## 2018-06-29 VITALS — BP 117/72 | Temp 98.0°F | Ht 61.0 in

## 2018-06-29 DIAGNOSIS — E7800 Pure hypercholesterolemia, unspecified: Secondary | ICD-10-CM | POA: Diagnosis not present

## 2018-06-29 DIAGNOSIS — E039 Hypothyroidism, unspecified: Secondary | ICD-10-CM | POA: Diagnosis not present

## 2018-06-29 DIAGNOSIS — R7309 Other abnormal glucose: Secondary | ICD-10-CM | POA: Diagnosis not present

## 2018-06-29 DIAGNOSIS — I10 Essential (primary) hypertension: Secondary | ICD-10-CM | POA: Diagnosis not present

## 2018-06-29 DIAGNOSIS — Z79899 Other long term (current) drug therapy: Secondary | ICD-10-CM

## 2018-06-29 NOTE — Patient Instructions (Addendum)
Check out the 7 min workout- can tailor this workout for what you can do.  Start out with 7 mins, you can do it!  Check out  Mini habits for weight loss book  2 apps for tracking food is myfitness pal  loseit OR can take picture of your food   Drink 1/2 your body weight in fluid ounces of water daily; drink a tall glass of water 30 min before meals  Don't eat until you're stuffed- listen to your stomach and eat until you are 80% full   Try eating off of a salad plate; wait 10 min after finishing before going back for seconds  Start by eating the vegetables on your plate; aim for 41% of your meals to be fruits or vegetables  Then eat your protein - lean meats (grass fed if possible), fish, beans, nuts in moderation  Eat your carbs/starch last ONLY if you still are hungry. If you can, stop before finishing it all  Avoid sugar and flour - the closer it looks to it's original form in nature, typically the better it is for you  Splurge in moderation - "assign" days when you get to splurge and have the "bad stuff" - I like to follow a 80% - 20% plan- "good" choices 80 % of the time, "bad" choices in moderation 20% of the time  Simple equation is: Calories out > calories in = weight loss - even if you eat the bad stuff, if you limit portions, you will still lose weight

## 2018-08-01 ENCOUNTER — Other Ambulatory Visit: Payer: Self-pay | Admitting: Adult Health

## 2018-08-01 MED FILL — PROPRANOLOL 80 MG TABLET: 80 | 90 days supply | Qty: 180 | Fill #1

## 2018-08-02 MED FILL — ATORVASTATIN 20 MG TABLET: 20 | 90 days supply | Qty: 90 | Fill #0

## 2018-08-15 ENCOUNTER — Other Ambulatory Visit: Payer: Self-pay | Admitting: *Deleted

## 2018-08-15 MED ORDER — SYNTHROID 137 MCG PO TABS: ORAL_TABLET | ORAL | 1 refills | Status: DC

## 2018-08-15 MED FILL — SYNTHROID 137 MCG TABLET: 137 | 90 days supply | Qty: 90 | Fill #0

## 2018-08-22 ENCOUNTER — Other Ambulatory Visit: Payer: Self-pay | Admitting: Internal Medicine

## 2018-08-22 MED FILL — ESTRADIOL 1 MG TABLET: 1 | 90 days supply | Qty: 90 | Fill #0

## 2018-08-22 MED FILL — aMILoride HCL 5 MG TABS: 5 | 90 days supply | Qty: 90 | Fill #0

## 2018-09-26 ENCOUNTER — Other Ambulatory Visit: Payer: Self-pay | Admitting: Internal Medicine

## 2018-09-26 MED FILL — ESOMEPRAZOLE MAG DR 40 MG C: 40 | 90 days supply | Qty: 90 | Fill #0

## 2018-09-26 MED FILL — metFORMIN HCL ER 500 MG TB2: 500 | 90 days supply | Qty: 360 | Fill #0

## 2018-09-27 MED FILL — ESCITALOPRAM 20 MG TABLET: 20 | 90 days supply | Qty: 90 | Fill #0

## 2018-09-28 ENCOUNTER — Telehealth: Payer: Self-pay | Admitting: Radiology

## 2018-09-28 NOTE — Telephone Encounter (Signed)
Patient requests updated work note. She needs note to state exactly what last one did. OK for note?

## 2018-09-30 NOTE — Telephone Encounter (Signed)
That will be fine for an updated work note just like the ones that have been done for her in the past.

## 2018-10-02 NOTE — Telephone Encounter (Signed)
Note written

## 2018-10-23 DIAGNOSIS — H5203 Hypermetropia, bilateral: Secondary | ICD-10-CM | POA: Diagnosis not present

## 2018-10-23 DIAGNOSIS — H524 Presbyopia: Secondary | ICD-10-CM | POA: Diagnosis not present

## 2018-10-23 DIAGNOSIS — H52223 Regular astigmatism, bilateral: Secondary | ICD-10-CM | POA: Diagnosis not present

## 2018-10-23 DIAGNOSIS — H25813 Combined forms of age-related cataract, bilateral: Secondary | ICD-10-CM | POA: Diagnosis not present

## 2018-10-31 ENCOUNTER — Other Ambulatory Visit: Payer: Self-pay | Admitting: Internal Medicine

## 2018-11-01 MED FILL — CANDESARTAN CILEXETIL 32 MG: 32 | 90 days supply | Qty: 90 | Fill #0

## 2018-11-01 MED FILL — PROPRANOLOL 80 MG TABLET: 80 | 90 days supply | Qty: 180 | Fill #0

## 2018-11-01 NOTE — Progress Notes (Signed)
Assessment and Plan:   Hypertension -Continue medication, monitor blood pressure at home. Continue DASH diet.  Reminder to go to the ER if any CP, SOB, nausea, dizziness, severe HA, changes vision/speech, left arm numbness and tingling and jaw pain.   Cholesterol -Continue diet and exercise.   Vitamin D Def - continue medications.    Morbid Obesity with co morbidities- - follow up 4 months for progress monitoring - increase veggies, decrease carbs - long discussion about weight loss, diet, and exercise  Hypothyroidism -check TSH level, continue medications the same, reminded to take on an empty stomach 30-27mins before food.   WANTS 90 DAY OF MEDICATIONS Every 4 months for visit.  Continue diet and meds as discussed. Further disposition pending results of labs.Over 30 minutes of exam, counseling, chart review, and critical decision making was performed Future Appointments  Date Time Provider [place]  03/09/2019  9:30 AM Vicie Mutters, PA-C [facility] None    HPI 58 y.o. female  presents for 3 month follow up on hypertension, cholesterol, prediabetes, and vitamin D deficiency.   Her blood pressure has been controlled at home, today their BP is BP: 134/76  She does not workout. She denies chest pain, shortness of breath, dizziness. BMI is Body mass index is 44.97 kg/m., she is working on diet and exercise. It was her birthday last week so she ate more cake than normal. She has increased her water and is wanting to get back to doing well.  Wt Readings from Last 3 Encounters:  11/02/18 238 lb (108 kg)  02/24/18 243 lb (110.2 kg)  10/28/17 257 lb (116.6 kg)     She is on cholesterol medication, lipitor 10 pill daily and denies myalgias. Her cholesterol is at goal. The cholesterol last visit was:   Lab Results  Component Value Date   CHOL 153 02/24/2018   HDL 55 02/24/2018   LDLCALC 74 02/24/2018   TRIG 163 (H) 02/24/2018   CHOLHDL 2.8 02/24/2018   She has been  working on diet and exercise for prediabetes, and denies paresthesia of the feet, polydipsia, polyuria and visual disturbances.   Last A1C in the office was:  Lab Results  Component Value Date   HGBA1C 5.8 (H) 02/24/2018   Patient is on Vitamin D supplement, 5000 IU dailly.    Lab Results  Component Value Date   VD25OH 76 02/24/2018    She is on thyroid medication. Her medication was not changed last visit.  Lab Results  Component Value Date   TSH 2.06 02/24/2018     Current Medications:  Current Outpatient Medications on File Prior to Visit  Medication Sig Dispense Refill  . albuterol (PROVENTIL HFA;VENTOLIN HFA) 108 (90 Base) MCG/ACT inhaler Inhale 2 puffs into the lungs every 6 (six) hours as needed for wheezing or shortness of breath. 18 g 2  . aMILoride (MIDAMOR) 5 MG tablet TAKE 1 TABLET BY MOUTH EVERY MORNING. 90 tablet 0  . Ascorbic Acid (VITAMIN C) 1000 MG tablet Take 1,000 mg by mouth daily.    Marland Kitchen aspirin 325 MG EC tablet Take 325 mg by mouth every morning.     Marland Kitchen atorvastatin (LIPITOR) 20 MG tablet TAKE 1 TABLET BY MOUTH ONCE DAILY 90 tablet 1  . candesartan (ATACAND) 32 MG tablet Take 1 tablet at Night for BP 90 tablet 1  . cetirizine (ZYRTEC) 10 MG tablet Take 10 mg by mouth daily.    . Cholecalciferol (VITAMIN D-3) 5000 UNITS TABS Take 5,000 Units by  mouth daily.    . cyanocobalamin 2000 MCG tablet Take 2,000 mcg by mouth daily.    Marland Kitchen EPINEPHRINE 0.3 mg/0.3 mL IJ SOAJ injection USE AS DIRECTED 2 Device PRN  . escitalopram (LEXAPRO) 20 MG tablet TAKE 1 TABLET BY MOUTH ONCE DAILY 90 tablet 0  . esomeprazole (NEXIUM) 40 MG capsule TAKE 1 CAPSULE BY MOUTH DAILY. 90 capsule 3  . estradiol (ESTRACE) 1 MG tablet TAKE 1/2 TO 1 TABLET BY MOUTH ONCE DAILY FOR VASOMOTOR SYMPTOMS 90 tablet 0  . famciclovir (FAMVIR) 500 MG tablet Take 500 mg by mouth 3 (three) times daily. Take AD for fever blister    . fluticasone (FLONASE) 50 MCG/ACT nasal spray Place 1 spray into both nostrils  daily. 48 g 2  . Glucosamine-Chondroitin (GLUCOSAMINE CHONDR COMPLEX PO) Take 1,500 mg by mouth. 1500mg /1200mg   Actual dose    . metFORMIN (GLUCOPHAGE-XR) 500 MG 24 hr tablet TAKE 2 TABLETS BY MOUTH TWICE DAILY 360 tablet PRN  . oxybutynin (DITROPAN) 5 MG tablet Take 1 tablet (5 mg total) by mouth 2 (two) times daily as needed (sweating). 60 tablet 0  . phentermine (ADIPEX-P) 37.5 MG tablet Take 1 tablet (37.5 mg total) by mouth daily before breakfast. 30 tablet 2  . propranolol (INDERAL) 80 MG tablet Take 1 tablet 2 x /day - AM & PM for BP 180 tablet 1  . SYNTHROID 137 MCG tablet TAKE 1 TABLET BY MOUTH ONCE A DAY BEFORE BREAKFAST. 90 tablet 1  . traMADol (ULTRAM) 50 MG tablet Take 1 tablet (50 mg total) by mouth every 12 (twelve) hours as needed (cough). 30 tablet 0   No current facility-administered medications on file prior to visit.    Medical History:  Past Medical History:  Diagnosis Date  . GERD (gastroesophageal reflux disease)   . High cholesterol   . Hypertension   . Hypothyroidism   . Multinodular goiter    Allergies:  Allergies  Allergen Reactions  . Avelox [Moxifloxacin Hcl In Nacl] Hives    Thrush, throat might have closed   . Hydrochlorothiazide Itching    On hands and feet  . Other Itching    All "Cillins", hives alao  . Penicillins     Hives  . Prednisone     Chest pains  . Zostavax [Zoster Vaccine Live]   . Codeine Hives, Itching and Rash  . Levothyroxine Rash    Only to generic. Not to brand synthroid.   . Sulfa Antibiotics Itching and Rash    Hands and feet itch and turn red    Review of Systems:  Review of Systems  Constitutional: Negative.   HENT: Negative.   Eyes: Negative.   Respiratory: Negative.   Cardiovascular: Negative for chest pain, palpitations, orthopnea, claudication, leg swelling and PND.  Gastrointestinal: Negative.   Genitourinary: Negative.   Musculoskeletal: Positive for joint pain (bilateral knees).  Skin: Negative.    Neurological: Negative.   Endo/Heme/Allergies: Negative.   Psychiatric/Behavioral: Negative.     Family history- Review and unchanged Social history- Review and unchanged Physical Exam: BP 134/76   Pulse 68   Temp (!) 97.4 F (36.3 C)   Ht 5\' 1"  (1.549 m)   Wt 238 lb (108 kg)   SpO2 97%   BMI 44.97 kg/m  Wt Readings from Last 3 Encounters:  11/02/18 238 lb (108 kg)  02/24/18 243 lb (110.2 kg)  10/28/17 257 lb (116.6 kg)   General Appearance: Well nourished, in no apparent distress. Eyes: PERRLA, EOMs, conjunctiva  no swelling or erythema Sinuses: No Frontal/maxillary tenderness ENT/Mouth: Ext aud canals clear, TMs without erythema, bulging. No erythema, swelling, or exudate on post pharynx.  Tonsils not swollen or erythematous. Hearing normal.  Neck: Supple, thyroid normal.  Respiratory: Respiratory effort normal, BS equal bilaterally without rales, rhonchi, wheezing or stridor.  Cardio: RRR with no MRGs. Brisk peripheral pulses with 2+ edema.  Abdomen: Soft, + BS, obese,  Non tender, no guarding, rebound, hernias, masses. Lymphatics: Non tender without lymphadenopathy.  Musculoskeletal: Full ROM, 5/5 strength Skin: Warm, dry without rashes, lesions, ecchymosis.  Neuro: Cranial nerves intact. Normal muscle tone, no cerebellar symptoms. Psych: Awake and oriented X 3, normal affect, Insight and Judgment appropriate.    Quentin MullingAmanda Taegen Delker, PA-C  9:38 AM [facility]

## 2018-11-02 ENCOUNTER — Ambulatory Visit (INDEPENDENT_AMBULATORY_CARE_PROVIDER_SITE_OTHER): Payer: 59 | Admitting: Physician Assistant

## 2018-11-02 ENCOUNTER — Encounter: Payer: Self-pay | Admitting: Physician Assistant

## 2018-11-02 ENCOUNTER — Other Ambulatory Visit: Payer: Self-pay

## 2018-11-02 VITALS — BP 134/76 | HR 68 | Temp 97.4°F | Ht 61.0 in | Wt 238.0 lb

## 2018-11-02 DIAGNOSIS — E039 Hypothyroidism, unspecified: Secondary | ICD-10-CM | POA: Diagnosis not present

## 2018-11-02 DIAGNOSIS — E559 Vitamin D deficiency, unspecified: Secondary | ICD-10-CM | POA: Diagnosis not present

## 2018-11-02 DIAGNOSIS — I10 Essential (primary) hypertension: Secondary | ICD-10-CM

## 2018-11-02 DIAGNOSIS — Z79899 Other long term (current) drug therapy: Secondary | ICD-10-CM | POA: Diagnosis not present

## 2018-11-02 DIAGNOSIS — R7309 Other abnormal glucose: Secondary | ICD-10-CM | POA: Diagnosis not present

## 2018-11-02 DIAGNOSIS — E7800 Pure hypercholesterolemia, unspecified: Secondary | ICD-10-CM | POA: Diagnosis not present

## 2018-11-02 MED ORDER — FAMCICLOVIR 500 MG PO TABS: 500.0000 mg | ORAL_TABLET | Freq: Three times a day (TID) | ORAL | 3 refills | Status: DC

## 2018-11-02 MED ORDER — FLUTICASONE PROPIONATE 50 MCG/ACT NA SUSP: 1.0000 | Freq: Every day | NASAL | 2 refills | Status: AC

## 2018-11-02 MED FILL — FLUTICASONE PROP 50 MCG SPR: 50 | 60 days supply | Qty: 16 | Fill #0

## 2018-11-02 MED FILL — FAMCICLOVIR 500 MG TABLET: 500 | 10 days supply | Qty: 30 | Fill #0

## 2018-11-03 LAB — COMPLETE METABOLIC PANEL WITH GFR
AG Ratio: 1.8 (calc) (ref 1.0–2.5)
ALT: 20 U/L (ref 6–29)
AST: 22 U/L (ref 10–35)
Albumin: 4.5 g/dL (ref 3.6–5.1)
Alkaline phosphatase (APISO): 70 U/L (ref 37–153)
BUN: 16 mg/dL (ref 7–25)
CO2: 24 mmol/L (ref 20–32)
Calcium: 10.3 mg/dL (ref 8.6–10.4)
Chloride: 103 mmol/L (ref 98–110)
Creat: 0.72 mg/dL (ref 0.50–1.05)
GFR, Est African American: 106 mL/min/{1.73_m2} (ref >=60)
GFR, Est Non African American: 92 mL/min/{1.73_m2} (ref >=60)
Globulin: 2.5 g/dL (calc) (ref 1.9–3.7)
Glucose, Bld: 84 mg/dL (ref 65–99)
Potassium: 4.7 mmol/L (ref 3.5–5.3)
Sodium: 138 mmol/L (ref 135–146)
Total Bilirubin: 0.4 mg/dL (ref 0.2–1.2)
Total Protein: 7 g/dL (ref 6.1–8.1)

## 2018-11-03 LAB — VITAMIN D 25 HYDROXY (VIT D DEFICIENCY, FRACTURES): Vit D, 25-Hydroxy: 40 ng/mL (ref 30–100)

## 2018-11-03 LAB — HEMOGLOBIN A1C
Hgb A1c MFr Bld: 5.7 % of total Hgb — ABNORMAL HIGH (ref <5.7)
Mean Plasma Glucose: 117 (calc)
eAG (mmol/L): 6.5 (calc)

## 2018-11-03 LAB — CBC WITH DIFFERENTIAL/PLATELET
Absolute Monocytes: 627 cells/uL (ref 200–950)
Basophils Absolute: 121 cells/uL (ref 0–200)
Basophils Relative: 1.1 %
Eosinophils Absolute: 484 cells/uL (ref 15–500)
Eosinophils Relative: 4.4 %
HCT: 41.6 % (ref 35.0–45.0)
Hemoglobin: 13.6 g/dL (ref 11.7–15.5)
Lymphs Abs: 3630 cells/uL (ref 850–3900)
MCH: 28.4 pg (ref 27.0–33.0)
MCHC: 32.7 g/dL (ref 32.0–36.0)
MCV: 86.8 fL (ref 80.0–100.0)
MPV: 10.8 fL (ref 7.5–12.5)
Monocytes Relative: 5.7 %
Neutro Abs: 6138 cells/uL (ref 1500–7800)
Neutrophils Relative %: 55.8 %
Platelets: 327 10*3/uL (ref 140–400)
RBC: 4.79 10*6/uL (ref 3.80–5.10)
RDW: 12.7 % (ref 11.0–15.0)
Total Lymphocyte: 33 %
WBC: 11 10*3/uL — ABNORMAL HIGH (ref 3.8–10.8)

## 2018-11-03 LAB — MAGNESIUM: Magnesium: 1.7 mg/dL (ref 1.5–2.5)

## 2018-11-03 LAB — LIPID PANEL
Cholesterol: 153 mg/dL (ref <200)
HDL: 55 mg/dL (ref >=50)
LDL Cholesterol (Calc): 74 mg/dL (calc)
Non-HDL Cholesterol (Calc): 98 mg/dL (calc) (ref <130)
Total CHOL/HDL Ratio: 2.8 (calc) (ref <5.0)
Triglycerides: 161 mg/dL — ABNORMAL HIGH (ref <150)

## 2018-11-03 LAB — TSH: TSH: 1.65 mIU/L (ref 0.40–4.50)

## 2018-11-29 ENCOUNTER — Other Ambulatory Visit: Payer: Self-pay | Admitting: Physician Assistant

## 2018-11-29 MED FILL — aMILoride HCL 5 MG TABS: 5 | 90 days supply | Qty: 90 | Fill #0

## 2018-11-29 MED FILL — ESTRADIOL 1 MG TABLET: 1 | 90 days supply | Qty: 90 | Fill #0

## 2018-12-19 DIAGNOSIS — Z803 Family history of malignant neoplasm of breast: Secondary | ICD-10-CM | POA: Diagnosis not present

## 2018-12-19 DIAGNOSIS — Z1231 Encounter for screening mammogram for malignant neoplasm of breast: Secondary | ICD-10-CM | POA: Diagnosis not present

## 2018-12-19 LAB — HM MAMMOGRAPHY

## 2018-12-20 ENCOUNTER — Other Ambulatory Visit: Payer: Self-pay | Admitting: Adult Health

## 2018-12-20 ENCOUNTER — Encounter: Payer: Self-pay | Admitting: *Deleted

## 2018-12-20 ENCOUNTER — Other Ambulatory Visit: Payer: Self-pay | Admitting: Physician Assistant

## 2018-12-20 MED FILL — METFORMIN HCL ER 500 MG TB2: 500 | 90 days supply | Qty: 360 | Fill #0

## 2018-12-20 MED FILL — ESOMEPRAZOLE MAG DR 40 MG C: 40 | 90 days supply | Qty: 90 | Fill #1

## 2018-12-20 MED FILL — ESCITALOPRAM 20 MG TABLET: 20 | 90 days supply | Qty: 90 | Fill #0

## 2019-01-22 MED FILL — ATORVASTATIN 20 MG TABLET: 20 | 90 days supply | Qty: 90 | Fill #1

## 2019-01-22 MED FILL — PROPRANOLOL 80 MG TABLET: 80 | 90 days supply | Qty: 180 | Fill #1

## 2019-02-13 ENCOUNTER — Other Ambulatory Visit: Payer: Self-pay | Admitting: Physician Assistant

## 2019-02-13 MED FILL — FAMCICLOVIR 500 MG TABLET: 500 | 10 days supply | Qty: 30 | Fill #1

## 2019-02-13 MED FILL — aMILoride HCL 5 MG TABS: 5 | 90 days supply | Qty: 90 | Fill #0

## 2019-02-13 MED FILL — SYNTHROID 137 MCG TABLET: 137 | 90 days supply | Qty: 90 | Fill #0

## 2019-03-05 NOTE — Progress Notes (Deleted)
Complete Physical  Assessment and Plan: Essential hypertension - continue medications, DASH diet, exercise and monitor at home. Call if greater than 130/80.  - CBC with Differential/Platelet - BASIC METABOLIC PANEL WITH GFR - Hepatic function panel - Urinalysis, Routine w reflex microscopic (not at [facility]) - Microalbumin / creatinine urine ratio - EKG 12-Lead  Hypothyroidism, unspecified hypothyroidism type Hypothyroidism-check TSH level, continue medications the same, reminded to take on an empty stomach 30-17mins before food.  - TSH  Morbid obesity, unspecified obesity type (HCC) Obesity with co morbidities- long discussion about weight loss, diet, and exercise - follow up 3 months for progress monitoring - increase veggies, decrease carbs - long discussion about weight loss, diet, and exercise  High cholesterol -continue medications, check lipids, decrease fatty foods, increase activity.  - Lipid panel   Medication management - Magnesium   Hot flashes, menopausal versus hyperhidrosis Will try oxybutynin as needed Continue lexapro, estrogen, on bASA   Vitamin D deficiency - VITAMIN D 25 Hydroxy (Vit-D Deficiency, Fractures)  Gastroesophageal reflux disease with esophagitis Continue PPI/H2 blocker, diet discussed  Biliary dyskinesia Continue PPI/H2 blocker, diet discussed   Encounter for general adult medical examination with abnormal findings  BMI 45.0-49.9, adult (HCC). - follow up 3 months for progress monitoring - increase veggies, decrease carbs - long discussion about weight loss, diet, and exercise  Screening, anemia, deficiency, iron -     Iron,Total/Total Iron Binding Cap -     Vitamin B12   Discussed med's effects and SE's. Screening labs and tests as requested with regular follow-up as recommended. Future Appointments  Date Time Provider Department Center  03/09/2019  9:30 AM Quentin Mulling, PA-C [facility] None    HPI  59 y.o. female  presents  for a complete physical.    Her blood pressure has been controlled at home, today their BP is   She does not workout due to time restraints.  She denies chest pain, shortness of breath, dizziness.  She has been working 13-14 hour days.  She continue to have knee pain.  She is on cholesterol medication, lipitor 20 1/2 pill daily and denies myalgias. Her cholesterol is at goal. The cholesterol last visit was:   Lab Results  Component Value Date   CHOL 153 11/02/2018   HDL 55 11/02/2018   LDLCALC 74 11/02/2018   TRIG 161 (H) 11/02/2018   CHOLHDL 2.8 11/02/2018    She has been working on diet and exercise for prediabetes, she is on 4 pill a day, and denies paresthesia of the feet, polydipsia, polyuria and visual disturbances. Last A1C in the office was:  Lab Results  Component Value Date   HGBA1C 5.7 (H) 11/02/2018   Patient is on Vitamin D supplement, on 1000 IU daily Lab Results  Component Value Date   VD25OH 40 11/02/2018    She has hot flashes, is on lexapro, low dose estrogen and bASA- states she is still sweating. States from her chest up will get sweating, has happened "forever" prior to menopause even, would like to try an as needed.   She is on thyroid medication. Her medication was not changed last visit.   Lab Results  Component Value Date   TSH 1.65 11/02/2018  .  BMI is There is no height or weight on file to calculate BMI., she is struggling with weight loss due to stress, time limitations.  Works 3rd shift, gets up 330-400. Has not been taking the phentermine due to sweating.  Wt Readings from Last  3 Encounters:  11/02/18 238 lb (108 kg)  02/24/18 243 lb (110.2 kg)  10/28/17 257 lb (116.6 kg)    Current Medications:  Current Outpatient Medications on File Prior to Visit  Medication Sig Dispense Refill  . albuterol (PROVENTIL HFA;VENTOLIN HFA) 108 (90 Base) MCG/ACT inhaler Inhale 2 puffs into the lungs every 6 (six) hours as needed for wheezing or shortness of  breath. 18 g 2  . aMILoride (MIDAMOR) 5 MG tablet Take 1 tablet every Morning for BP & Fluid Retention / Ankle Swelling 90 tablet 1  . Ascorbic Acid (VITAMIN C) 1000 MG tablet Take 1,000 mg by mouth daily.    Marland Kitchen aspirin 325 MG EC tablet Take 325 mg by mouth every morning.     Marland Kitchen atorvastatin (LIPITOR) 20 MG tablet TAKE 1 TABLET BY MOUTH ONCE DAILY 90 tablet 1  . candesartan (ATACAND) 32 MG tablet Take 1 tablet at Night for BP 90 tablet 1  . cetirizine (ZYRTEC) 10 MG tablet Take 10 mg by mouth daily.    . Cholecalciferol (VITAMIN D-3) 5000 UNITS TABS Take 5,000 Units by mouth daily.    . cyanocobalamin 2000 MCG tablet Take 2,000 mcg by mouth daily.    Marland Kitchen EPINEPHRINE 0.3 mg/0.3 mL IJ SOAJ injection USE AS DIRECTED 2 Device PRN  . escitalopram (LEXAPRO) 20 MG tablet TAKE 1 TABLET BY MOUTH ONCE DAILY 90 tablet 0  . esomeprazole (NEXIUM) 40 MG capsule TAKE 1 CAPSULE BY MOUTH DAILY. 90 capsule 3  . estradiol (ESTRACE) 1 MG tablet TAKE 1/2 TO 1 TABLET BY MOUTH ONCE DAILY FOR VASOMOTOR SYMPTOMS 90 tablet 0  . famciclovir (FAMVIR) 500 MG tablet Take 1 tablet (500 mg total) by mouth 3 (three) times daily. Take AD for fever blister 30 tablet 3  . fluticasone (FLONASE) 50 MCG/ACT nasal spray Place 1 spray into both nostrils daily. 48 g 2  . Glucosamine-Chondroitin (GLUCOSAMINE CHONDR COMPLEX PO) Take 1,500 mg by mouth. 1500mg /1200mg   Actual dose    . metFORMIN (GLUCOPHAGE-XR) 500 MG 24 hr tablet TAKE 2 TABLETS BY MOUTH TWICE DAILY 360 tablet 95  . oxybutynin (DITROPAN) 5 MG tablet Take 1 tablet (5 mg total) by mouth 2 (two) times daily as needed (sweating). 60 tablet 0  . phentermine (ADIPEX-P) 37.5 MG tablet Take 1 tablet (37.5 mg total) by mouth daily before breakfast. 30 tablet 2  . propranolol (INDERAL) 80 MG tablet Take 1 tablet 2 x /day - AM & PM for BP 180 tablet 1  . SYNTHROID 137 MCG tablet Take 1 tablet daily on an empty stomach with only water for 30 minutes & no Antacid meds, Calcium or Magnesium  for 4 hours & avoid Biotin 90 tablet 1  . traMADol (ULTRAM) 50 MG tablet Take 1 tablet (50 mg total) by mouth every 12 (twelve) hours as needed (cough). 30 tablet 0   No current facility-administered medications on file prior to visit.   Health Maintenance:   Immunization History  Administered Date(s) Administered  . DTaP 12/07/2012  . Influenza-Unspecified 11/22/2012, 11/12/2014, 11/17/2017  . Pneumococcal Polysaccharide-23 02/22/2009  . Td 02/23/2000  . Tdap 12/07/2012  . Zoster 10/29/2010   Tetanus: 2014 Pneumovax: 2011 Flu vaccine: 10/2016 [facility] Prevnar 13: N/A Zostavax:2012  LMP: 2005 p. Hysterectomy, 1 ovary and cervix Pap: 11/2013,  + stress incontinence due 2020  MGM: 11/2017 category A,  DEXA: N/A TB gold- negative Colonoscopy: 04/2014, Dr. 05/2014.  EGD: N/A CT AB 2003 CXR 12/2012 01/2013 thyroid and BX 2013,  Dr. Buddy Duty.  Last Dental Exam: Dr. Gilford Rile Last Eye Exam: Dr. Jorja Loa   Patient Care Team: Unk Pinto, MD as PCP - General (Internal Medicine) Teena Irani, MD (Inactive) as Consulting Physician (Gastroenterology) Delrae Rend, MD as Consulting Physician (Endocrinology)  Medical History:  Past Medical History:  Diagnosis Date  . GERD (gastroesophageal reflux disease)   . High cholesterol   . Hypertension   . Hypothyroidism   . Multinodular goiter    Allergies Allergies  Allergen Reactions  . Avelox [Moxifloxacin Hcl In Nacl] Hives    Thrush, throat might have closed   . Hydrochlorothiazide Itching    On hands and feet  . Other Itching    All "Cillins", hives alao  . Penicillins     Hives  . Prednisone     Chest pains  . Zostavax [Zoster Vaccine Live]   . Codeine Hives, Itching and Rash  . Levothyroxine Rash    Only to generic. Not to brand synthroid.   . Sulfa Antibiotics Itching and Rash    Hands and feet itch and turn red    SURGICAL HISTORY She  has a past surgical history that includes Carpal tunnel release (yrs ago); Knee surgery  (arthroscopic, 3 yrs ago); Tonsillectomy (age 89); Abdominal hysterectomy (8 yrs ago); Tubal ligation (yrs ago); radioactive iodine to thyroid' (march 2013); and Cholecystectomy (12/28/2011). FAMILY HISTORY Her family history includes Cancer in her mother; Heart attack in her father; Stroke in her father and mother. SOCIAL HISTORY She  reports that she has never smoked. She has never used smokeless tobacco. She reports that she does not drink alcohol or use drugs.  Review of Systems  Constitutional: Negative.  Negative for chills and fever.  HENT: Negative.   Eyes: Negative.   Respiratory: Negative.  Negative for shortness of breath.   Cardiovascular: Negative.  Negative for chest pain.  Gastrointestinal: Negative.   Genitourinary: Negative for dysuria, flank pain, frequency, hematuria and urgency.  Musculoskeletal: Positive for joint pain. Negative for back pain, falls, myalgias and neck pain.  Skin: Negative for rash.  Neurological: Negative.   Endo/Heme/Allergies: Negative.   Psychiatric/Behavioral: Negative.  Negative for depression. The patient does not have insomnia.      Physical Exam: Estimated body mass index is 44.97 kg/m as calculated from the following:   Height as of 11/02/18: 5\' 1"  (1.549 m).   Weight as of 11/02/18: 238 lb (108 kg). There were no vitals taken for this visit. General Appearance: Well nourished, in no apparent distress.  Eyes: PERRLA, EOMs, conjunctiva no swelling or erythema, normal fundi and vessels.  Sinuses: No Frontal/maxillary tenderness  ENT/Mouth: Ext aud canals clear, normal light reflex with TMs without erythema, bulging. Good dentition. No erythema, swelling, or exudate on post pharynx. Tonsils not swollen or erythematous. Hearing normal.  Neck: Supple, thyroid normal. No bruits  Respiratory: Respiratory effort normal, BS equal bilaterally without rales, rhonchi, wheezing or stridor.  Cardio: RRR without murmurs, rubs or gallops. Brisk  peripheral pulses without edema.  Chest: symmetric, with normal excursions and percussion.  Breasts: Symmetric, without lumps, nipple discharge, retractions. Abdomen: Soft, nontender, obese, no guarding, rebound, hernias, masses, or organomegaly. .  Lymphatics: Non tender without lymphadenopathy.  Genitourinary: defer Musculoskeletal: Full ROM all peripheral extremities,5/5 strength, and normal gait.  Skin: Warm, dry without rashes, lesions, ecchymosis. Neuro: Cranial nerves intact, reflexes equal bilaterally. Normal muscle tone, no cerebellar symptoms. Sensation intact.  Psych: Awake and oriented X 3, normal affect, Insight and Judgment appropriate.  EKG: WNL no ST changes. AORTA SCAN: defer   Quentin Mulling 1:50 PM [facility]

## 2019-03-09 ENCOUNTER — Encounter: Payer: Self-pay | Admitting: Physician Assistant

## 2019-03-19 ENCOUNTER — Other Ambulatory Visit: Payer: Self-pay | Admitting: Internal Medicine

## 2019-03-19 ENCOUNTER — Other Ambulatory Visit: Payer: Self-pay | Admitting: Physician Assistant

## 2019-03-19 MED FILL — ESOMEPRAZOLE MAG DR 40 MG C: 40 | 90 days supply | Qty: 90 | Fill #0

## 2019-03-19 MED FILL — ESTRADIOL 1 MG TABLET: 1 | 90 days supply | Qty: 90 | Fill #0

## 2019-03-19 MED FILL — ESCITALOPRAM 20 MG TABLET: 20 | 90 days supply | Qty: 90 | Fill #0

## 2019-03-19 MED FILL — METFORMIN HCL ER 500 MG TB2: 500 | 90 days supply | Qty: 360 | Fill #1

## 2019-03-28 NOTE — Progress Notes (Deleted)
Complete Physical  Assessment and Plan: Essential hypertension - continue medications, DASH diet, exercise and monitor at home. Call if greater than 130/80.  - CBC with Differential/Platelet - BASIC METABOLIC PANEL WITH GFR - Hepatic function panel - Urinalysis, Routine w reflex microscopic (not at [facility]) - Microalbumin / creatinine urine ratio - EKG 12-Lead  Hypothyroidism, unspecified hypothyroidism type Hypothyroidism-check TSH level, continue medications the same, reminded to take on an empty stomach 30-75mins before food.  - TSH  Morbid obesity, unspecified obesity type (HCC) Obesity with co morbidities- long discussion about weight loss, diet, and exercise - follow up 3 months for progress monitoring - increase veggies, decrease carbs - long discussion about weight loss, diet, and exercise  High cholesterol -continue medications, check lipids, decrease fatty foods, increase activity.  - Lipid panel   Medication management - Magnesium   Hot flashes, menopausal versus hyperhidrosis Will try oxybutynin as needed Continue lexapro, estrogen, on bASA   Vitamin D deficiency - VITAMIN D 25 Hydroxy (Vit-D Deficiency, Fractures)  Gastroesophageal reflux disease with esophagitis Continue PPI/H2 blocker, diet discussed  Biliary dyskinesia Continue PPI/H2 blocker, diet discussed   Encounter for general adult medical examination with abnormal findings  BMI 45.0-49.9, adult (HCC). - follow up 3 months for progress monitoring - increase veggies, decrease carbs - long discussion about weight loss, diet, and exercise  Screening, anemia, deficiency, iron -     Iron,Total/Total Iron Binding Cap -     Vitamin B12   Discussed med's effects and SE's. Screening labs and tests as requested with regular follow-up as recommended. Future Appointments  Date Time Provider Department Center  03/30/2019  8:45 AM Quentin Mulling, PA-C [facility] None  03/31/2020  9:00 AM Quentin Mulling,  PA-C [facility] None    HPI  59 y.o. female  presents for a complete physical.    Her blood pressure has been controlled at home, today their BP is   She does not workout due to time restraints.  She denies chest pain, shortness of breath, dizziness.  She has been working 13-14 hour days.  She continue to have knee pain.  She is on cholesterol medication, lipitor 20 1/2 pill daily and denies myalgias. Her cholesterol is at goal. The cholesterol last visit was:   Lab Results  Component Value Date   CHOL 153 11/02/2018   HDL 55 11/02/2018   LDLCALC 74 11/02/2018   TRIG 161 (H) 11/02/2018   CHOLHDL 2.8 11/02/2018    She has been working on diet and exercise for prediabetes, she is on 4 pill a day, and denies paresthesia of the feet, polydipsia, polyuria and visual disturbances. Last A1C in the office was:  Lab Results  Component Value Date   HGBA1C 5.7 (H) 11/02/2018   Patient is on Vitamin D supplement, on 1000 IU daily Lab Results  Component Value Date   VD25OH 40 11/02/2018    She has hot flashes, is on lexapro, low dose estrogen and bASA- states she is still sweating. States from her chest up will get sweating, has happened "forever" prior to menopause even, would like to try an as needed.   She is on thyroid medication. Her medication was not changed last visit.   Lab Results  Component Value Date   TSH 1.65 11/02/2018  .  BMI is There is no height or weight on file to calculate BMI., she is struggling with weight loss due to stress, time limitations.  Works 3rd shift, gets up 330-400. Has not been taking  the phentermine due to sweating.  Wt Readings from Last 3 Encounters:  11/02/18 238 lb (108 kg)  02/24/18 243 lb (110.2 kg)  10/28/17 257 lb (116.6 kg)    Current Medications:  Current Outpatient Medications on File Prior to Visit  Medication Sig Dispense Refill  . albuterol (PROVENTIL HFA;VENTOLIN HFA) 108 (90 Base) MCG/ACT inhaler Inhale 2 puffs into the lungs every  6 (six) hours as needed for wheezing or shortness of breath. 18 g 2  . aMILoride (MIDAMOR) 5 MG tablet Take 1 tablet every Morning for BP & Fluid Retention / Ankle Swelling 90 tablet 1  . Ascorbic Acid (VITAMIN C) 1000 MG tablet Take 1,000 mg by mouth daily.    Marland Kitchen aspirin 325 MG EC tablet Take 325 mg by mouth every morning.     Marland Kitchen atorvastatin (LIPITOR) 20 MG tablet TAKE 1 TABLET BY MOUTH ONCE DAILY 90 tablet 1  . candesartan (ATACAND) 32 MG tablet Take 1 tablet at Night for BP 90 tablet 1  . cetirizine (ZYRTEC) 10 MG tablet Take 10 mg by mouth daily.    . Cholecalciferol (VITAMIN D-3) 5000 UNITS TABS Take 5,000 Units by mouth daily.    . cyanocobalamin 2000 MCG tablet Take 2,000 mcg by mouth daily.    Marland Kitchen EPINEPHRINE 0.3 mg/0.3 mL IJ SOAJ injection USE AS DIRECTED 2 Device PRN  . escitalopram (LEXAPRO) 20 MG tablet TAKE 1 TABLET BY MOUTH ONCE DAILY 90 tablet 0  . esomeprazole (NEXIUM) 40 MG capsule Take 1 capsule Daily for Heartburn & Indigestion 90 capsule 1  . estradiol (ESTRACE) 1 MG tablet TAKE 1/2 TO 1 TABLET BY MOUTH ONCE DAILY FOR VASOMOTOR SYMPTOMS 90 tablet 0  . famciclovir (FAMVIR) 500 MG tablet Take 1 tablet (500 mg total) by mouth 3 (three) times daily. Take AD for fever blister 30 tablet 3  . fluticasone (FLONASE) 50 MCG/ACT nasal spray Place 1 spray into both nostrils daily. 48 g 2  . Glucosamine-Chondroitin (GLUCOSAMINE CHONDR COMPLEX PO) Take 1,500 mg by mouth. 1500mg /1200mg   Actual dose    . metFORMIN (GLUCOPHAGE-XR) 500 MG 24 hr tablet TAKE 2 TABLETS BY MOUTH TWICE DAILY 360 tablet 95  . oxybutynin (DITROPAN) 5 MG tablet Take 1 tablet (5 mg total) by mouth 2 (two) times daily as needed (sweating). 60 tablet 0  . phentermine (ADIPEX-P) 37.5 MG tablet Take 1 tablet (37.5 mg total) by mouth daily before breakfast. 30 tablet 2  . propranolol (INDERAL) 80 MG tablet Take 1 tablet 2 x /day - AM & PM for BP 180 tablet 1  . SYNTHROID 137 MCG tablet Take 1 tablet daily on an empty stomach  with only water for 30 minutes & no Antacid meds, Calcium or Magnesium for 4 hours & avoid Biotin 90 tablet 1  . traMADol (ULTRAM) 50 MG tablet Take 1 tablet (50 mg total) by mouth every 12 (twelve) hours as needed (cough). 30 tablet 0   No current facility-administered medications on file prior to visit.   Health Maintenance:   Immunization History  Administered Date(s) Administered  . DTaP 12/07/2012  . Influenza-Unspecified 11/22/2012, 11/12/2014, 11/17/2017  . Pneumococcal Polysaccharide-23 02/22/2009  . Td 02/23/2000  . Tdap 12/07/2012  . Zoster 10/29/2010   Tetanus: 2014 Pneumovax: 2011 Flu vaccine: 10/[facility] Prevnar 13: N/A Zostavax:2012  LMP: 2005 p. Hysterectomy, 1 ovary and cervix Pap: 11/2013,  + stress incontinence due 2020  MGM: 11/2017 category A,  DEXA: N/A TB gold- negative Colonoscopy: 04/2014, Dr. 05/2014.  EGD: N/A CT AB 2003 CXR 12/2012 US thyroid and BX 2013, Dr. Sharl Ma.  Last Dental Exam: Dr. Dan Humphreys Last Eye Exam: Dr. Charise Killian   Patient Care Team: Lucky Cowboy, MD as PCP - General (Internal Medicine) Dorena Cookey, MD (Inactive) as Consulting Physician (Gastroenterology) Talmage Coin, MD as Consulting Physician (Endocrinology)  Medical History:  Past Medical History:  Diagnosis Date  . GERD (gastroesophageal reflux disease)   . High cholesterol   . Hypertension   . Hypothyroidism   . Multinodular goiter    Allergies Allergies  Allergen Reactions  . Avelox [Moxifloxacin Hcl In Nacl] Hives    Thrush, throat might have closed   . Hydrochlorothiazide Itching    On hands and feet  . Other Itching    All "Cillins", hives alao  . Penicillins     Hives  . Prednisone     Chest pains  . Zostavax [Zoster Vaccine Live]   . Codeine Hives, Itching and Rash  . Levothyroxine Rash    Only to generic. Not to brand synthroid.   . Sulfa Antibiotics Itching and Rash    Hands and feet itch and turn red    SURGICAL HISTORY She  has a past  surgical history that includes Carpal tunnel release (yrs ago); Knee surgery (arthroscopic, 3 yrs ago); Tonsillectomy (age 84); Abdominal hysterectomy (8 yrs ago); Tubal ligation (yrs ago); radioactive iodine to thyroid' (march 2013); and Cholecystectomy (12/28/2011). FAMILY HISTORY Her family history includes Cancer in her mother; Heart attack in her father; Stroke in her father and mother. SOCIAL HISTORY She  reports that she has never smoked. She has never used smokeless tobacco. She reports that she does not drink alcohol or use drugs.  Review of Systems  Constitutional: Negative.  Negative for chills and fever.  HENT: Negative.   Eyes: Negative.   Respiratory: Negative.  Negative for shortness of breath.   Cardiovascular: Negative.  Negative for chest pain.  Gastrointestinal: Negative.   Genitourinary: Negative for dysuria, flank pain, frequency, hematuria and urgency.  Musculoskeletal: Positive for joint pain. Negative for back pain, falls, myalgias and neck pain.  Skin: Negative for rash.  Neurological: Negative.   Endo/Heme/Allergies: Negative.   Psychiatric/Behavioral: Negative.  Negative for depression. The patient does not have insomnia.      Physical Exam: Estimated body mass index is 44.97 kg/m as calculated from the following:   Height as of 11/02/18: 5\' 1"  (1.549 m).   Weight as of 11/02/18: 238 lb (108 kg). There were no vitals taken for this visit. General Appearance: Well nourished, in no apparent distress.  Eyes: PERRLA, EOMs, conjunctiva no swelling or erythema, normal fundi and vessels.  Sinuses: No Frontal/maxillary tenderness  ENT/Mouth: Ext aud canals clear, normal light reflex with TMs without erythema, bulging. Good dentition. No erythema, swelling, or exudate on post pharynx. Tonsils not swollen or erythematous. Hearing normal.  Neck: Supple, thyroid normal. No bruits  Respiratory: Respiratory effort normal, BS equal bilaterally without rales, rhonchi,  wheezing or stridor.  Cardio: RRR without murmurs, rubs or gallops. Brisk peripheral pulses without edema.  Chest: symmetric, with normal excursions and percussion.  Breasts: Symmetric, without lumps, nipple discharge, retractions. Abdomen: Soft, nontender, obese, no guarding, rebound, hernias, masses, or organomegaly. .  Lymphatics: Non tender without lymphadenopathy.  Genitourinary: defer Musculoskeletal: Full ROM all peripheral extremities,5/5 strength, and normal gait.  Skin: Warm, dry without rashes, lesions, ecchymosis. Neuro: Cranial nerves intact, reflexes equal bilaterally. Normal muscle tone, no cerebellar symptoms. Sensation intact.  Psych: Awake  and oriented X 3, normal affect, Insight and Judgment appropriate.   EKG: WNL no ST changes. AORTA SCAN: defer   Vicie Mutters 3:01 PM [facility]

## 2019-03-30 ENCOUNTER — Encounter: Payer: Self-pay | Admitting: Physician Assistant

## 2019-04-05 NOTE — Progress Notes (Deleted)
Complete Physical  Assessment and Plan: Essential hypertension - continue medications, DASH diet, exercise and monitor at home. Call if greater than 130/80.  - CBC with Differential/Platelet - BASIC METABOLIC PANEL WITH GFR - Hepatic function panel - Urinalysis, Routine w reflex microscopic (not at [facility]) - Microalbumin / creatinine urine ratio - EKG 12-Lead  Hypothyroidism, unspecified hypothyroidism type Hypothyroidism-check TSH level, continue medications the same, reminded to take on an empty stomach 30-47mins before food.  - TSH  Morbid obesity, unspecified obesity type (HCC) Obesity with co morbidities- long discussion about weight loss, diet, and exercise - follow up 3 months for progress monitoring - increase veggies, decrease carbs - long discussion about weight loss, diet, and exercise  High cholesterol -continue medications, check lipids, decrease fatty foods, increase activity.  - Lipid panel   Medication management - Magnesium   Hot flashes, menopausal versus hyperhidrosis Will try oxybutynin as needed Continue lexapro, estrogen, on bASA   Vitamin D deficiency - VITAMIN D 25 Hydroxy (Vit-D Deficiency, Fractures)  Gastroesophageal reflux disease with esophagitis Continue PPI/H2 blocker, diet discussed  Biliary dyskinesia Continue PPI/H2 blocker, diet discussed   Encounter for general adult medical examination with abnormal findings  BMI 45.0-49.9, adult (HCC). - follow up 3 months for progress monitoring - increase veggies, decrease carbs - long discussion about weight loss, diet, and exercise  Screening, anemia, deficiency, iron -     Iron,Total/Total Iron Binding Cap -     Vitamin B12   Discussed med's effects and SE's. Screening labs and tests as requested with regular follow-up as recommended. Future Appointments  Date Time Provider Department Center  04/06/2019 10:15 AM Quentin Mulling, PA-C [facility] None  03/31/2020  9:00 AM Quentin Mulling,  PA-C [facility] None    HPI  59 y.o. female  presents for a complete physical.    Her blood pressure has been controlled at home, today their BP is   She does not workout due to time restraints.  She denies chest pain, shortness of breath, dizziness.  She has been working 13-14 hour days.  She continue to have knee pain.  She is on cholesterol medication, lipitor 20 1/2 pill daily and denies myalgias. Her cholesterol is at goal. The cholesterol last visit was:   Lab Results  Component Value Date   CHOL 153 11/02/2018   HDL 55 11/02/2018   LDLCALC 74 11/02/2018   TRIG 161 (H) 11/02/2018   CHOLHDL 2.8 11/02/2018    She has been working on diet and exercise for prediabetes, she is on 4 pill a day, and denies paresthesia of the feet, polydipsia, polyuria and visual disturbances. Last A1C in the office was:  Lab Results  Component Value Date   HGBA1C 5.7 (H) 11/02/2018   Patient is on Vitamin D supplement, on 1000 IU daily Lab Results  Component Value Date   VD25OH 40 11/02/2018    She has hot flashes, is on lexapro, low dose estrogen and bASA- states she is still sweating. States from her chest up will get sweating, has happened "forever" prior to menopause even, would like to try an as needed.   She is on thyroid medication. Her medication was not changed last visit.   Lab Results  Component Value Date   TSH 1.65 11/02/2018  .  BMI is There is no height or weight on file to calculate BMI., she is struggling with weight loss due to stress, time limitations.  Works 3rd shift, gets up 330-400. Has not been taking the  phentermine due to sweating.  Wt Readings from Last 3 Encounters:  11/02/18 238 lb (108 kg)  02/24/18 243 lb (110.2 kg)  10/28/17 257 lb (116.6 kg)    Current Medications:  Current Outpatient Medications on File Prior to Visit  Medication Sig Dispense Refill  . albuterol (PROVENTIL HFA;VENTOLIN HFA) 108 (90 Base) MCG/ACT inhaler Inhale 2 puffs into the lungs every  6 (six) hours as needed for wheezing or shortness of breath. 18 g 2  . aMILoride (MIDAMOR) 5 MG tablet Take 1 tablet every Morning for BP & Fluid Retention / Ankle Swelling 90 tablet 1  . Ascorbic Acid (VITAMIN C) 1000 MG tablet Take 1,000 mg by mouth daily.    Marland Kitchen aspirin 325 MG EC tablet Take 325 mg by mouth every morning.     Marland Kitchen atorvastatin (LIPITOR) 20 MG tablet TAKE 1 TABLET BY MOUTH ONCE DAILY 90 tablet 1  . candesartan (ATACAND) 32 MG tablet Take 1 tablet at Night for BP 90 tablet 1  . cetirizine (ZYRTEC) 10 MG tablet Take 10 mg by mouth daily.    . Cholecalciferol (VITAMIN D-3) 5000 UNITS TABS Take 5,000 Units by mouth daily.    . cyanocobalamin 2000 MCG tablet Take 2,000 mcg by mouth daily.    Marland Kitchen EPINEPHRINE 0.3 mg/0.3 mL IJ SOAJ injection USE AS DIRECTED 2 Device PRN  . escitalopram (LEXAPRO) 20 MG tablet TAKE 1 TABLET BY MOUTH ONCE DAILY 90 tablet 0  . esomeprazole (NEXIUM) 40 MG capsule Take 1 capsule Daily for Heartburn & Indigestion 90 capsule 1  . estradiol (ESTRACE) 1 MG tablet TAKE 1/2 TO 1 TABLET BY MOUTH ONCE DAILY FOR VASOMOTOR SYMPTOMS 90 tablet 0  . famciclovir (FAMVIR) 500 MG tablet Take 1 tablet (500 mg total) by mouth 3 (three) times daily. Take AD for fever blister 30 tablet 3  . fluticasone (FLONASE) 50 MCG/ACT nasal spray Place 1 spray into both nostrils daily. 48 g 2  . Glucosamine-Chondroitin (GLUCOSAMINE CHONDR COMPLEX PO) Take 1,500 mg by mouth. 1500mg /1200mg   Actual dose    . metFORMIN (GLUCOPHAGE-XR) 500 MG 24 hr tablet TAKE 2 TABLETS BY MOUTH TWICE DAILY 360 tablet 95  . oxybutynin (DITROPAN) 5 MG tablet Take 1 tablet (5 mg total) by mouth 2 (two) times daily as needed (sweating). 60 tablet 0  . phentermine (ADIPEX-P) 37.5 MG tablet Take 1 tablet (37.5 mg total) by mouth daily before breakfast. 30 tablet 2  . propranolol (INDERAL) 80 MG tablet Take 1 tablet 2 x /day - AM & PM for BP 180 tablet 1  . SYNTHROID 137 MCG tablet Take 1 tablet daily on an empty stomach  with only water for 30 minutes & no Antacid meds, Calcium or Magnesium for 4 hours & avoid Biotin 90 tablet 1  . traMADol (ULTRAM) 50 MG tablet Take 1 tablet (50 mg total) by mouth every 12 (twelve) hours as needed (cough). 30 tablet 0   No current facility-administered medications on file prior to visit.   Health Maintenance:   Immunization History  Administered Date(s) Administered  . DTaP 12/07/2012  . Influenza-Unspecified 11/22/2012, 11/12/2014, 11/17/2017  . Pneumococcal Polysaccharide-23 02/22/2009  . Td 02/23/2000  . Tdap 12/07/2012  . Zoster 10/29/2010   Tetanus: 2014 Pneumovax: 2011 Flu vaccine: 10/[facility] Prevnar 13: N/A Zostavax:2012  LMP: 2005 p. Hysterectomy, 1 ovary and cervix Pap: 11/2013,  + stress incontinence due 2020  MGM: 11/2017 category A,  DEXA: N/A TB gold- negative Colonoscopy: 04/2014, Dr. Amedeo Plenty.  EGD:  N/A CT AB 2003 CXR 12/2012 US thyroid and BX 2013, Dr. Sharl Ma.  Last Dental Exam: Dr. Dan Humphreys Last Eye Exam: Dr. Charise Killian   Patient Care Team: Lucky Cowboy, MD as PCP - General (Internal Medicine) Dorena Cookey, MD (Inactive) as Consulting Physician (Gastroenterology) Talmage Coin, MD as Consulting Physician (Endocrinology)  Medical History:  Past Medical History:  Diagnosis Date  . GERD (gastroesophageal reflux disease)   . High cholesterol   . Hypertension   . Hypothyroidism   . Multinodular goiter    Allergies Allergies  Allergen Reactions  . Avelox [Moxifloxacin Hcl In Nacl] Hives    Thrush, throat might have closed   . Hydrochlorothiazide Itching    On hands and feet  . Other Itching    All "Cillins", hives alao  . Penicillins     Hives  . Prednisone     Chest pains  . Zostavax [Zoster Vaccine Live]   . Codeine Hives, Itching and Rash  . Levothyroxine Rash    Only to generic. Not to brand synthroid.   . Sulfa Antibiotics Itching and Rash    Hands and feet itch and turn red    SURGICAL HISTORY She  has a past  surgical history that includes Carpal tunnel release (yrs ago); Knee surgery (arthroscopic, 3 yrs ago); Tonsillectomy (age 31); Abdominal hysterectomy (8 yrs ago); Tubal ligation (yrs ago); radioactive iodine to thyroid' (march 2013); and Cholecystectomy (12/28/2011). FAMILY HISTORY Her family history includes Cancer in her mother; Heart attack in her father; Stroke in her father and mother. SOCIAL HISTORY She  reports that she has never smoked. She has never used smokeless tobacco. She reports that she does not drink alcohol or use drugs.  Review of Systems  Constitutional: Negative.  Negative for chills and fever.  HENT: Negative.   Eyes: Negative.   Respiratory: Negative.  Negative for shortness of breath.   Cardiovascular: Negative.  Negative for chest pain.  Gastrointestinal: Negative.   Genitourinary: Negative for dysuria, flank pain, frequency, hematuria and urgency.  Musculoskeletal: Positive for joint pain. Negative for back pain, falls, myalgias and neck pain.  Skin: Negative for rash.  Neurological: Negative.   Endo/Heme/Allergies: Negative.   Psychiatric/Behavioral: Negative.  Negative for depression. The patient does not have insomnia.      Physical Exam: Estimated body mass index is 44.97 kg/m as calculated from the following:   Height as of 11/02/18: 5\' 1"  (1.549 m).   Weight as of 11/02/18: 238 lb (108 kg). There were no vitals taken for this visit. General Appearance: Well nourished, in no apparent distress.  Eyes: PERRLA, EOMs, conjunctiva no swelling or erythema, normal fundi and vessels.  Sinuses: No Frontal/maxillary tenderness  ENT/Mouth: Ext aud canals clear, normal light reflex with TMs without erythema, bulging. Good dentition. No erythema, swelling, or exudate on post pharynx. Tonsils not swollen or erythematous. Hearing normal.  Neck: Supple, thyroid normal. No bruits  Respiratory: Respiratory effort normal, BS equal bilaterally without rales, rhonchi,  wheezing or stridor.  Cardio: RRR without murmurs, rubs or gallops. Brisk peripheral pulses without edema.  Chest: symmetric, with normal excursions and percussion.  Breasts: Symmetric, without lumps, nipple discharge, retractions. Abdomen: Soft, nontender, obese, no guarding, rebound, hernias, masses, or organomegaly. .  Lymphatics: Non tender without lymphadenopathy.  Genitourinary: defer Musculoskeletal: Full ROM all peripheral extremities,5/5 strength, and normal gait.  Skin: Warm, dry without rashes, lesions, ecchymosis. Neuro: Cranial nerves intact, reflexes equal bilaterally. Normal muscle tone, no cerebellar symptoms. Sensation intact.  Psych: Awake and  oriented X 3, normal affect, Insight and Judgment appropriate.   EKG: WNL no ST changes. AORTA SCAN: defer   Quentin Mulling 8:45 AM [facility]

## 2019-04-06 ENCOUNTER — Encounter: Payer: Self-pay | Admitting: Physician Assistant

## 2019-05-07 ENCOUNTER — Other Ambulatory Visit: Payer: Self-pay | Admitting: Internal Medicine

## 2019-05-07 MED FILL — PROPRANOLOL 80 MG TABLET: 80 | 90 days supply | Qty: 180 | Fill #0

## 2019-05-15 MED FILL — aMILoride HCL 5 MG TABS: 5 | 90 days supply | Qty: 90 | Fill #1

## 2019-05-15 MED FILL — CANDESARTAN CILEXETIL 32 MG: 32 | 90 days supply | Qty: 90 | Fill #1

## 2019-05-15 MED FILL — SYNTHROID 137 MCG TABLET: 137 | 90 days supply | Qty: 90 | Fill #1

## 2019-05-16 NOTE — Progress Notes (Signed)
Complete Physical  Assessment and Plan: Essential hypertension - continue medications, DASH diet, exercise and monitor at home. Call if greater than 130/80.  - CBC with Differential/Platelet - BASIC METABOLIC PANEL WITH GFR - Hepatic function panel - Urinalysis, Routine w reflex microscopic (not at [facility]) - Microalbumin / creatinine urine ratio - EKG 12-Lead  Hypothyroidism, unspecified hypothyroidism type Hypothyroidism-check TSH level, continue medications the same, reminded to take on an empty stomach 30-43mins before food.  - TSH  Morbid obesity, unspecified obesity type ([place]) Obesity with co morbidities- long discussion about weight loss, diet, and exercise - follow up 3 months for progress monitoring - increase veggies, decrease carbs - long discussion about weight loss, diet, and exercise  High cholesterol -continue medications, check lipids, decrease fatty foods, increase activity.  - Lipid panel   Medication management - Magnesium   Hot flashes, menopausal versus hyperhidrosis Will try oxybutynin as needed Continue lexapro, estrogen, on bASA   Vitamin D deficiency - VITAMIN D 25 Hydroxy (Vit-D Deficiency, Fractures)  Gastroesophageal reflux disease with esophagitis Continue PPI/H2 blocker, diet discussed  Biliary dyskinesia Continue PPI/H2 blocker, diet discussed   Encounter for general adult medical examination with abnormal findings  BMI 45.0-49.9, adult ([place]). - follow up 3 months for progress monitoring - increase veggies, decrease carbs - long discussion about weight loss, diet, and exercise  Screening, anemia, deficiency, iron -     Iron,Total/Total Iron Binding Cap -     Vitamin B12   Discussed med's effects and SE's. Screening labs and tests as requested with regular follow-up as recommended. Future Appointments  Date Time Provider [place]  05/20/2020 10:00 AM Vicie Mutters, PA-C [facility] None    HPI  59 y.o. female  presents  for a complete physical.    Her blood pressure has been controlled at home, today their BP is BP: 120/80 She does not workout due to time restraints.  She denies chest pain, shortness of breath, dizziness.   She has recurrent rash under her AB, however has one right now that is worse than ever. Has been putting alcohol on it.   She has been working 13-14 hour days, works 3rd shift, she admits to not drinking enough water and eating too much sugar. She is stressed with her mother with possible dementia, no help from her family.  BMI is Body mass index is 46.83 kg/m., she is working on diet and exercise. Has not been taking the phentermine.  Wt Readings from Last 3 Encounters:  05/18/19 239 lb 12.8 oz (108.8 kg)  11/02/18 238 lb (108 kg)  02/24/18 243 lb (110.2 kg)   She continue to have knee pain.  She is on cholesterol medication, lipitor 20 1/2 pill daily and denies myalgias. Her cholesterol is at goal. The cholesterol last visit was:   Lab Results  Component Value Date   CHOL 153 11/02/2018   HDL 55 11/02/2018   LDLCALC 74 11/02/2018   TRIG 161 (H) 11/02/2018   CHOLHDL 2.8 11/02/2018    She has been working on diet and exercise for prediabetes, she is on 4 pill a day, and denies paresthesia of the feet, polydipsia, polyuria and visual disturbances. Last A1C in the office was:  Lab Results  Component Value Date   HGBA1C 5.7 (H) 11/02/2018   Patient is on Vitamin D supplement, on 1000 IU daily Lab Results  Component Value Date   VD25OH 40 11/02/2018    She has hot flashes, is on lexapro 20 mg, low  dose estrogen, 1/2 tablet just recently reduced this week and bASA.   She is on thyroid medication. Her medication was not changed last visit.   Lab Results  Component Value Date   TSH 1.65 11/02/2018  .   Current Medications:   Current Outpatient Medications (Endocrine & Metabolic):  .  estradiol (ESTRACE) 1 MG tablet, TAKE 1/2 TO 1 TABLET BY MOUTH ONCE DAILY FOR VASOMOTOR  SYMPTOMS .  metFORMIN (GLUCOPHAGE-XR) 500 MG 24 hr tablet, TAKE 2 TABLETS BY MOUTH TWICE DAILY .  SYNTHROID 137 MCG tablet, Take 1 tablet daily on an empty stomach with only water for 30 minutes & no Antacid meds, Calcium or Magnesium for 4 hours & avoid Biotin  Current Outpatient Medications (Cardiovascular):  .  aMILoride (MIDAMOR) 5 MG tablet, Take 1 tablet every Morning for BP & Fluid Retention / Ankle Swelling .  atorvastatin (LIPITOR) 20 MG tablet, TAKE 1 TABLET BY MOUTH ONCE DAILY .  candesartan (ATACAND) 32 MG tablet, Take 1 tablet at Night for BP .  EPINEPHRINE 0.3 mg/0.3 mL IJ SOAJ injection, USE AS DIRECTED .  propranolol (INDERAL) 80 MG tablet, TAKE 1 TABLET BY MOUTH TWICE A DAY IN THE MORNING AND IN THE EVENING FOR BLOOD PRESSURE  Current Outpatient Medications (Respiratory):  .  albuterol (PROVENTIL HFA;VENTOLIN HFA) 108 (90 Base) MCG/ACT inhaler, Inhale 2 puffs into the lungs every 6 (six) hours as needed for wheezing or shortness of breath. .  cetirizine (ZYRTEC) 10 MG tablet, Take 10 mg by mouth daily. .  fluticasone (FLONASE) 50 MCG/ACT nasal spray, Place 1 spray into both nostrils daily.  Current Outpatient Medications (Analgesics):  .  aspirin 325 MG EC tablet, Take 325 mg by mouth every morning.  .  traMADol (ULTRAM) 50 MG tablet, Take 1 tablet (50 mg total) by mouth every 12 (twelve) hours as needed (cough).  Current Outpatient Medications (Hematological):  .  cyanocobalamin 2000 MCG tablet, Take 2,000 mcg by mouth daily.  Current Outpatient Medications (Other):  Marland Kitchen  Ascorbic Acid (VITAMIN C) 1000 MG tablet, Take 1,000 mg by mouth daily. .  Cholecalciferol (VITAMIN D-3) 5000 UNITS TABS, Take 5,000 Units by mouth daily. Marland Kitchen  escitalopram (LEXAPRO) 20 MG tablet, TAKE 1 TABLET BY MOUTH ONCE DAILY .  esomeprazole (NEXIUM) 40 MG capsule, Take 1 capsule Daily for Heartburn & Indigestion .  famciclovir (FAMVIR) 500 MG tablet, Take 1 tablet (500 mg total) by mouth 3 (three)  times daily. Take AD for fever blister .  Glucosamine-Chondroitin (GLUCOSAMINE CHONDR COMPLEX PO), Take 1,500 mg by mouth. 1500mg /1200mg   Actual dose .  oxybutynin (DITROPAN) 5 MG tablet, Take 1 tablet (5 mg total) by mouth 2 (two) times daily as needed (sweating). .  phentermine (ADIPEX-P) 37.5 MG tablet, Take 1 tablet (37.5 mg total) by mouth daily before breakfast. (Patient not taking: Reported on 05/18/2019)  Health Maintenance:   Immunization History  Administered Date(s) Administered  . DTaP 12/07/2012  . Influenza-Unspecified 11/22/2012, 11/12/2014, 11/17/2017  . Pneumococcal Polysaccharide-23 02/22/2009  . Td 02/23/2000  . Tdap 12/07/2012  . Zoster 10/29/2010   Tetanus: 2014 Pneumovax: 2011 Flu vaccine: 10/[facility] Prevnar 13: N/A Zostavax:2012 COVID vaccine completed with [facility]  LMP: 2005 p. Hysterectomy, 1 ovary and cervix Pap: 02/2018 due 5 years,  + stress incontinence MGM: 11/2018 category A,  DEXA: N/A due age 38 TB gold- negative Colonoscopy: 04/2014, Dr. 05/2014 due 10 years.  EGD: N/A CT AB 2003 CXR 12/2012 01/2013 thyroid and BX 2013, Dr. 2014.  Last Dental Exam: Dr. Dan Humphreys Last Eye Exam: Dr. Charise Killian   Patient Care Team: Lucky Cowboy, MD as PCP - General (Internal Medicine) Dorena Cookey, MD (Inactive) as Consulting Physician (Gastroenterology) Talmage Coin, MD as Consulting Physician (Endocrinology)  Medical History:  Past Medical History:  Diagnosis Date  . GERD (gastroesophageal reflux disease)   . High cholesterol   . Hypertension   . Hypothyroidism   . Multinodular goiter    Allergies Allergies  Allergen Reactions  . Avelox [Moxifloxacin Hcl In Nacl] Hives    Thrush, throat might have closed   . Hydrochlorothiazide Itching    On hands and feet  . Other Itching    All "Cillins", hives alao  . Penicillins     Hives  . Prednisone     Chest pains  . Zostavax [Zoster Vaccine Live]   . Codeine Hives, Itching and Rash  . Levothyroxine Rash     Only to generic. Not to brand synthroid.   . Sulfa Antibiotics Itching and Rash    Hands and feet itch and turn red    SURGICAL HISTORY She  has a past surgical history that includes Carpal tunnel release (yrs ago); Knee surgery (arthroscopic, 3 yrs ago); Tonsillectomy (age 45); Abdominal hysterectomy (8 yrs ago); Tubal ligation (yrs ago); radioactive iodine to thyroid' (march 2013); and Cholecystectomy (12/28/2011). FAMILY HISTORY Her family history includes Cancer in her mother; Heart attack in her father; Stroke in her father and mother. SOCIAL HISTORY She  reports that she has never smoked. She has never used smokeless tobacco. She reports that she does not drink alcohol or use drugs.  Review of Systems  Constitutional: Negative.  Negative for chills and fever.  HENT: Negative.   Eyes: Negative.   Respiratory: Negative.  Negative for shortness of breath.   Cardiovascular: Negative.  Negative for chest pain.  Gastrointestinal: Negative.   Genitourinary: Negative for dysuria, flank pain, frequency, hematuria and urgency.  Musculoskeletal: Positive for joint pain. Negative for back pain, falls, myalgias and neck pain.  Skin: Negative for rash.  Neurological: Negative.   Endo/Heme/Allergies: Negative.   Psychiatric/Behavioral: Negative.  Negative for depression. The patient does not have insomnia.      Physical Exam: Estimated body mass index is 46.83 kg/m as calculated from the following:   Height as of this encounter: 5' (1.524 m).   Weight as of this encounter: 239 lb 12.8 oz (108.8 kg). BP 120/80   Pulse 68   Temp 97.6 F (36.4 C)   Ht 5' (1.524 m)   Wt 239 lb 12.8 oz (108.8 kg)   SpO2 95%   BMI 46.83 kg/m  General Appearance: Well nourished, in no apparent distress.  Eyes: PERRLA, EOMs, conjunctiva no swelling or erythema, normal fundi and vessels.  Sinuses: No Frontal/maxillary tenderness  ENT/Mouth: Ext aud canals clear, normal light reflex with TMs without  erythema, bulging. Good dentition. No erythema, swelling, or exudate on post pharynx. Tonsils not swollen or erythematous. Hearing normal.  Neck: Supple, thyroid normal. No bruits  Respiratory: Respiratory effort normal, BS equal bilaterally without rales, rhonchi, wheezing or stridor.  Cardio: RRR without murmurs, rubs or gallops. Brisk peripheral pulses without edema.  Chest: symmetric, with normal excursions and percussion.  Breasts: Symmetric, without lumps, nipple discharge, retractions. Abdomen: Soft, nontender, obese, no guarding, rebound, hernias, masses, or organomegaly. .  Lymphatics: Non tender without lymphadenopathy.  Genitourinary: defer Musculoskeletal: Full ROM all peripheral extremities,5/5 strength, and normal gait.  Skin: Warm, dry without rashes, lesions, ecchymosis.  Neuro: Cranial nerves intact, reflexes equal bilaterally. Normal muscle tone, no cerebellar symptoms. Sensation intact.  Psych: Awake and oriented X 3, normal affect, Insight and Judgment appropriate.   EKG: WNL no ST changes- switched EKGs and no evidence of A flutter AORTA SCAN: defer   Quentin Mulling 11:08 AM [facility]

## 2019-05-18 ENCOUNTER — Other Ambulatory Visit: Payer: Self-pay

## 2019-05-18 ENCOUNTER — Ambulatory Visit: Payer: 59 | Admitting: Physician Assistant

## 2019-05-18 ENCOUNTER — Encounter: Payer: Self-pay | Admitting: Physician Assistant

## 2019-05-18 VITALS — BP 120/80 | HR 68 | Temp 97.6°F | Ht 60.0 in | Wt 239.8 lb

## 2019-05-18 DIAGNOSIS — R7309 Other abnormal glucose: Secondary | ICD-10-CM | POA: Diagnosis not present

## 2019-05-18 DIAGNOSIS — E7800 Pure hypercholesterolemia, unspecified: Secondary | ICD-10-CM

## 2019-05-18 DIAGNOSIS — K2100 Gastro-esophageal reflux disease with esophagitis, without bleeding: Secondary | ICD-10-CM

## 2019-05-18 DIAGNOSIS — E039 Hypothyroidism, unspecified: Secondary | ICD-10-CM

## 2019-05-18 DIAGNOSIS — K828 Other specified diseases of gallbladder: Secondary | ICD-10-CM

## 2019-05-18 DIAGNOSIS — Z136 Encounter for screening for cardiovascular disorders: Secondary | ICD-10-CM | POA: Diagnosis not present

## 2019-05-18 DIAGNOSIS — Z0001 Encounter for general adult medical examination with abnormal findings: Secondary | ICD-10-CM

## 2019-05-18 DIAGNOSIS — I10 Essential (primary) hypertension: Secondary | ICD-10-CM

## 2019-05-18 DIAGNOSIS — Z0000 Encounter for general adult medical examination without abnormal findings: Secondary | ICD-10-CM | POA: Diagnosis not present

## 2019-05-18 DIAGNOSIS — E559 Vitamin D deficiency, unspecified: Secondary | ICD-10-CM

## 2019-05-18 DIAGNOSIS — N951 Menopausal and female climacteric states: Secondary | ICD-10-CM

## 2019-05-18 DIAGNOSIS — R05 Cough: Secondary | ICD-10-CM

## 2019-05-18 DIAGNOSIS — Z79899 Other long term (current) drug therapy: Secondary | ICD-10-CM

## 2019-05-18 DIAGNOSIS — B372 Candidiasis of skin and nail: Secondary | ICD-10-CM

## 2019-05-18 DIAGNOSIS — R053 Chronic cough: Secondary | ICD-10-CM

## 2019-05-18 MED ORDER — KETOCONAZOLE 2 % EX CREA: 1.0000 "application " | TOPICAL_CREAM | Freq: Two times a day (BID) | CUTANEOUS | 0 refills | Status: DC

## 2019-05-18 MED ORDER — NYSTATIN 100000 UNIT/GM EX POWD: CUTANEOUS | 2 refills | Status: DC

## 2019-05-18 MED ORDER — TRIAMCINOLONE ACETONIDE 0.5 % EX CREA: 1.0000 "application " | TOPICAL_CREAM | Freq: Two times a day (BID) | CUTANEOUS | 2 refills | Status: DC

## 2019-05-18 MED FILL — NYSTATIN 100000 UNIT/GM POW: 100000 | 7 days supply | Qty: 60 | Fill #0

## 2019-05-18 MED FILL — KETOCONAZOLE 2% CREAM: 2 | 14 days supply | Qty: 30 | Fill #0

## 2019-05-18 MED FILL — TRIAMCINOLONE 0.5% CREAM: 0.5 | 25 days supply | Qty: 75 | Fill #0

## 2019-05-18 NOTE — Patient Instructions (Addendum)
INFORMATION ABOUT YOUR XRAY  Can walk into [address] for an Insurance account manager. They will have the order and take you back. You do not any paper work, I should get the result back today or tomorrow. This order is good for a year.  Can call [phone] to schedule an appointment if you wish.   I want to challenge you to lose 10 lbs before the next appointment.  That would be about 3 lbs a month!  This is very achievable and I know you can do it.  10 lbs is actually 40 lbs of pressure of your joints, back, hips, knees and ankles.  10lbs may be enough to help your blood pressure and cholesterol for next visit.    Skin Yeast Infection  A skin yeast infection is a condition in which there is an overgrowth of yeast (candida) that normally lives on the skin. This condition usually occurs in areas of the skin that are constantly warm and moist, such as the armpits or the groin. What are the causes? This condition is caused by a change in the normal balance of the yeast and bacteria that live on the skin. What increases the risk? You are more likely to develop this condition if you:  Are obese.  Are pregnant.  Take birth control pills.  Have diabetes.  Take antibiotic medicines.  Take steroid medicines.  Are malnourished.  Have a weak body defense system (immune system).  Are 60 years of age or older.  Wear tight clothing. What are the signs or symptoms? The most common symptom of this condition is itchiness in the affected area. Other symptoms include:  Red, swollen area of the skin.  Bumps on the skin. How is this diagnosed?  This condition is diagnosed with a medical history and physical exam.  Your health care provider may check for yeast by taking light scrapings of the skin to be viewed under a microscope. How is this treated? This condition is treated with medicine. Medicines may be prescribed or be available over the counter. The medicines may be:  Taken by mouth  (orally).  Applied as a cream or powder to your skin. Follow these instructions at home:   Take or apply over-the-counter and prescription medicines only as told by your health care provider.  Maintain a healthy weight. If you need help losing weight, talk with your health care provider.  Keep your skin clean and dry.  If you have diabetes, keep your blood sugar under control.  Keep all follow-up visits as told by your health care provider. This is important. Contact a health care provider if:  Your symptoms go away and then return.  Your symptoms do not get better with treatment.  Your symptoms get worse.  Your rash spreads.  You have a fever or chills.  You have new symptoms.  You have new warmth or redness of your skin. Summary  A skin yeast infection is a condition in which there is an overgrowth of yeast (candida) that normally lives on the skin. This condition is caused by a change in the normal balance of the yeast and bacteria that live on the skin.  Take or apply over-the-counter and prescription medicines only as told by your health care provider.  Keep your skin clean and dry.  Contact a health care provider if your symptoms do not get better with treatment. This information is not intended to replace advice given to you by your health care provider. Make sure  you discuss any questions you have with your health care provider. Document Revised: 06/28/2017 Document Reviewed: 06/28/2017 Elsevier Patient Education  2020 [employer].  Check out  Mini habits for weight loss book  2 free apps for tracking food is myfitness pal  loseit  If you want more structured weight loss that you have to pay for, you can look into  Noom  weight watchers  SMALL CHANGES  We want weight loss that will last so you should lose 1-2 pounds a week.  THAT IS IT! Please pick THREE things a month to change. Once it is a habit check off the item. Then pick another three items off  the list to become habits.  If you are already doing a habit on the list GREAT!  Cross that item off! o Don't drink your calories. Ie, alcohol, soda, fruit juice, and sweet tea.  o Drink more water. Drink a glass when you feel hungry or before each meal.  o Eat breakfast - Complex carb and protein (likeDannon light and fit yogurt, oatmeal, fruit, eggs, [place] bacon). o Measure your cereal.  Eat no more than one cup a day. (ie [place]) o Eat an apple a day. o Add a vegetable a day. o Try a new vegetable a month. o Use Pam! Stop using oil or butter to cook. o Don't finish your plate or use smaller plates. o Share your dessert. o Eat sugar free Jello for dessert or frozen grapes. o Don't eat 2-3 hours before bed. o Switch to whole wheat bread, pasta, and brown rice. o Make healthier choices when you eat out. No fries! o Pick baked chicken, NOT fried. o Don't forget to SLOW DOWN when you eat. It is not going anywhere.  o Take the stairs. o Park far away in the parking lot o [employer] (or weights) for 10 minutes while watching TV. o Walk at work for 10 minutes during break. o Walk outside 1 time a week with your friend, kids, dog, or significant other. o Start a walking group at church. o Walk the mall as much as you can tolerate.  o Keep a food diary. o Weigh yourself daily. o Walk for 15 minutes 3 days per week. o Cook at home more often and eat out less.  If life happens and you go back to old habits, it is okay.  Just start over. You can do it!   If you experience chest pain, get short of breath, or tired during the exercise, please stop immediately and inform your doctor.

## 2019-05-19 LAB — LIPID PANEL
Cholesterol: 167 mg/dL (ref <200)
HDL: 51 mg/dL (ref >=50)
LDL Cholesterol (Calc): 89 mg/dL (calc)
Non-HDL Cholesterol (Calc): 116 mg/dL (calc) (ref <130)
Total CHOL/HDL Ratio: 3.3 (calc) (ref <5.0)
Triglycerides: 177 mg/dL — ABNORMAL HIGH (ref <150)

## 2019-05-19 LAB — COMPLETE METABOLIC PANEL WITH GFR
AG Ratio: 1.7 (calc) (ref 1.0–2.5)
ALT: 18 U/L (ref 6–29)
AST: 19 U/L (ref 10–35)
Albumin: 4.1 g/dL (ref 3.6–5.1)
Alkaline phosphatase (APISO): 91 U/L (ref 37–153)
BUN: 12 mg/dL (ref 7–25)
CO2: 27 mmol/L (ref 20–32)
Calcium: 10.3 mg/dL (ref 8.6–10.4)
Chloride: 104 mmol/L (ref 98–110)
Creat: 0.77 mg/dL (ref 0.50–1.05)
GFR, Est African American: 98 mL/min/{1.73_m2} (ref >=60)
GFR, Est Non African American: 85 mL/min/{1.73_m2} (ref >=60)
Globulin: 2.4 g/dL (calc) (ref 1.9–3.7)
Glucose, Bld: 92 mg/dL (ref 65–99)
Potassium: 5.2 mmol/L (ref 3.5–5.3)
Sodium: 140 mmol/L (ref 135–146)
Total Bilirubin: 0.3 mg/dL (ref 0.2–1.2)
Total Protein: 6.5 g/dL (ref 6.1–8.1)

## 2019-05-19 LAB — URINALYSIS, ROUTINE W REFLEX MICROSCOPIC
Bilirubin Urine: NEGATIVE
Glucose, UA: NEGATIVE
Hgb urine dipstick: NEGATIVE
Ketones, ur: NEGATIVE
Leukocytes,Ua: NEGATIVE
Nitrite: NEGATIVE
Protein, ur: NEGATIVE
Specific Gravity, Urine: 1.02 (ref 1.001–1.03)
pH: 5.5 (ref 5.0–8.0)

## 2019-05-19 LAB — MAGNESIUM: Magnesium: 1.9 mg/dL (ref 1.5–2.5)

## 2019-05-19 LAB — CBC WITH DIFFERENTIAL/PLATELET
Absolute Monocytes: 653 cells/uL (ref 200–950)
Basophils Absolute: 128 cells/uL (ref 0–200)
Basophils Relative: 1.2 %
Eosinophils Absolute: 460 cells/uL (ref 15–500)
Eosinophils Relative: 4.3 %
HCT: 41.4 % (ref 35.0–45.0)
Hemoglobin: 13.3 g/dL (ref 11.7–15.5)
Lymphs Abs: 3328 cells/uL (ref 850–3900)
MCH: 28.5 pg (ref 27.0–33.0)
MCHC: 32.1 g/dL (ref 32.0–36.0)
MCV: 88.7 fL (ref 80.0–100.0)
MPV: 10.7 fL (ref 7.5–12.5)
Monocytes Relative: 6.1 %
Neutro Abs: 6131 cells/uL (ref 1500–7800)
Neutrophils Relative %: 57.3 %
Platelets: 314 10*3/uL (ref 140–400)
RBC: 4.67 10*6/uL (ref 3.80–5.10)
RDW: 12.6 % (ref 11.0–15.0)
Total Lymphocyte: 31.1 %
WBC: 10.7 10*3/uL (ref 3.8–10.8)

## 2019-05-19 LAB — TSH: TSH: 0.97 mIU/L (ref 0.40–4.50)

## 2019-05-19 LAB — HEMOGLOBIN A1C
Hgb A1c MFr Bld: 5.7 % of total Hgb — ABNORMAL HIGH (ref <5.7)
Mean Plasma Glucose: 117 (calc)
eAG (mmol/L): 6.5 (calc)

## 2019-05-19 LAB — MICROALBUMIN / CREATININE URINE RATIO
Creatinine, Urine: 97 mg/dL (ref 20–275)
Microalb Creat Ratio: 9 mcg/mg creat (ref <30)
Microalb, Ur: 0.9 mg/dL

## 2019-05-19 LAB — VITAMIN D 25 HYDROXY (VIT D DEFICIENCY, FRACTURES): Vit D, 25-Hydroxy: 50 ng/mL (ref 30–100)

## 2019-06-19 ENCOUNTER — Other Ambulatory Visit: Payer: Self-pay | Admitting: Physician Assistant

## 2019-06-19 MED FILL — ESOMEPRAZOLE MAG DR 40 MG C: 40 | 90 days supply | Qty: 90 | Fill #1

## 2019-06-19 MED FILL — METFORMIN HCL ER 500 MG TB2: 500 | 90 days supply | Qty: 360 | Fill #2

## 2019-06-20 MED FILL — ESCITALOPRAM 20 MG TABLET: 20 | 90 days supply | Qty: 90 | Fill #0

## 2019-07-10 ENCOUNTER — Other Ambulatory Visit: Payer: Self-pay | Admitting: Physician Assistant

## 2019-07-10 MED FILL — ESTRADIOL 1 MG TABS: 1 | 90 days supply | Qty: 90 | Fill #0

## 2019-07-30 ENCOUNTER — Other Ambulatory Visit: Payer: Self-pay | Admitting: Adult Health

## 2019-08-10 ENCOUNTER — Other Ambulatory Visit: Payer: Self-pay | Admitting: Internal Medicine

## 2019-08-10 MED FILL — SYNTHROID 137 MCG TABLET: 137 | 90 days supply | Qty: 90 | Fill #0

## 2019-08-28 ENCOUNTER — Other Ambulatory Visit: Payer: Self-pay | Admitting: Internal Medicine

## 2019-08-28 ENCOUNTER — Other Ambulatory Visit: Payer: Self-pay | Admitting: Physician Assistant

## 2019-08-28 MED FILL — aMILoride HCL 5 MG TABS: 5 | 90 days supply | Qty: 90 | Fill #0

## 2019-09-05 ENCOUNTER — Telehealth: Payer: Self-pay

## 2019-09-05 MED ORDER — FLUCONAZOLE 150 MG PO TABS: 150.0000 mg | ORAL_TABLET | Freq: Once | ORAL | 3 refills | Status: DC

## 2019-09-05 MED FILL — FLUCONAZOLE 150 MG TABS: 150 | 1 days supply | Qty: 1 | Fill #0

## 2019-09-05 NOTE — Telephone Encounter (Signed)
Patient has requested a Rx for DIFLUCAN  PHARMACY: [facility]

## 2019-09-05 NOTE — Addendum Note (Signed)
Addended by: Quentin Mulling R on: 09/05/2019 04:43 PM   Modules accepted: Orders

## 2019-09-10 ENCOUNTER — Ambulatory Visit (INDEPENDENT_AMBULATORY_CARE_PROVIDER_SITE_OTHER): Payer: 59 | Admitting: Physician Assistant

## 2019-09-10 ENCOUNTER — Other Ambulatory Visit: Payer: Self-pay

## 2019-09-10 DIAGNOSIS — L710 Perioral dermatitis: Secondary | ICD-10-CM

## 2019-09-10 MED ORDER — METRONIDAZOLE 0.75 % EX CREA: TOPICAL_CREAM | Freq: Two times a day (BID) | CUTANEOUS | 1 refills | Status: DC

## 2019-09-10 MED FILL — metroNIDAZOLE 0.75 % CREA: 0.75 | 21 days supply | Qty: 45 | Fill #0

## 2019-09-10 NOTE — Patient Instructions (Addendum)
Going to send in metro cream Use twice a day for 2 weeks Avoid cans Can do cool compresses Can do vaseline  If worse let me know.   Perioral dermatitis

## 2019-09-10 NOTE — Progress Notes (Signed)
Subjective:    Patient ID: Norma Sanchez, female    DOB: 12-31-1960, 59 y.o.   MRN: [id number]  HPI 59 y.o. WF presents for perioral rash x 2-3 weeks.  States she had something in the past like this, had valacyclovir that helped in the past but this did not help it. Continued to get worse. She states peroxide, alcohol without help. She has not tried any creams.   She does not vape or smoke.  No new medicine, has not tried topical medicine.  No tongue, or throat swelliing.  No fever, no chills.   Medications  Current Outpatient Medications (Endocrine & Metabolic):  .  estradiol (ESTRACE) 1 MG tablet, TAKE 1/2 TO 1 TABLET BY MOUTH ONCE DAILY FOR VASOMOTOR SYMPTOMS .  metFORMIN (GLUCOPHAGE-XR) 500 MG 24 hr tablet, TAKE 2 TABLETS BY MOUTH TWICE DAILY .  SYNTHROID 137 MCG tablet, TAKE 1 TABLET BY MOUTH DAILY ON AN EMPTY STOMACH WITH ONLY WATER FOR 30 MINUTES & NO ANTACID MEDS, CALCIUM OR MAGNESIUM FOR 4 HOURS & AVOID BIOTIN  Current Outpatient Medications (Cardiovascular):  .  aMILoride (MIDAMOR) 5 MG tablet, TAKE 1 TABLET BY MOUTH EVERY MORNING FOR BP & FLUID RETENTION / ANKLE SWELLING .  atorvastatin (LIPITOR) 20 MG tablet, TAKE 1 TABLET BY MOUTH ONCE DAILY .  candesartan (ATACAND) 32 MG tablet, Take 1 tablet at Night for BP .  EPINEPHRINE 0.3 mg/0.3 mL IJ SOAJ injection, USE AS DIRECTED .  propranolol (INDERAL) 80 MG tablet, TAKE 1 TABLET BY MOUTH TWICE A DAY IN THE MORNING AND IN THE EVENING FOR BLOOD PRESSURE  Current Outpatient Medications (Respiratory):  .  albuterol (PROVENTIL HFA;VENTOLIN HFA) 108 (90 Base) MCG/ACT inhaler, Inhale 2 puffs into the lungs every 6 (six) hours as needed for wheezing or shortness of breath. .  cetirizine (ZYRTEC) 10 MG tablet, Take 10 mg by mouth daily. .  fluticasone (FLONASE) 50 MCG/ACT nasal spray, Place 1 spray into both nostrils daily.  Current Outpatient Medications (Analgesics):  .  aspirin 325 MG EC tablet, Take 325 mg by mouth every morning.   .  traMADol (ULTRAM) 50 MG tablet, Take 1 tablet (50 mg total) by mouth every 12 (twelve) hours as needed (cough).  Current Outpatient Medications (Hematological):  .  cyanocobalamin 2000 MCG tablet, Take 2,000 mcg by mouth daily.  Current Outpatient Medications (Other):  Marland Kitchen  Ascorbic Acid (VITAMIN C) 1000 MG tablet, Take 1,000 mg by mouth daily. .  Cholecalciferol (VITAMIN D-3) 5000 UNITS TABS, Take 5,000 Units by mouth daily. Marland Kitchen  escitalopram (LEXAPRO) 20 MG tablet, Take 1 tablet Daily for Mood .  esomeprazole (NEXIUM) 40 MG capsule, Take 1 capsule Daily for Heartburn & Indigestion .  famciclovir (FAMVIR) 500 MG tablet, Take 1 tablet (500 mg total) by mouth 3 (three) times daily. Take AD for fever blister .  Glucosamine-Chondroitin (GLUCOSAMINE CHONDR COMPLEX PO), Take 1,500 mg by mouth. 1500mg /1200mg   Actual dose .  ketoconazole (NIZORAL) 2 % cream, Apply 1 application topically 2 (two) times daily. .  metroNIDAZOLE (METROCREAM) 0.75 % cream, Apply topically 2 (two) times daily.  nystatin (MYCOSTATIN/NYSTOP) powder, Apply daily after a shower as needed .  oxybutynin (DITROPAN) 5 MG tablet, Take 1 tablet (5 mg total) by mouth 2 (two) times daily as needed (sweating). .  phentermine (ADIPEX-P) 37.5 MG tablet, Take 1 tablet (37.5 mg total) by mouth daily before breakfast. (Patient not taking: Reported on 05/18/2019) .  triamcinolone cream (KENALOG) 0.5 %, Apply 1 application topically  2 (two) times daily.  Problem list She has Bilateral leg and foot pain; Morbid obesity (HCC); Biliary dyskinesia; Hypertension; GERD (gastroesophageal reflux disease); Hypothyroidism; High cholesterol; Chronic cough; Abnormal glucose; Hot flashes, menopausal; Vitamin D deficiency; and Medication management on their problem list.    Review of Systems See HPI    Objective:   Physical Exam Constitutional:      General: She is not in acute distress.    Appearance: Normal appearance. She is obese.  HENT:      Head: Normocephalic and atraumatic.     Right Ear: Tympanic membrane, ear canal and external ear normal.     Left Ear: Tympanic membrane, ear canal and external ear normal.     Nose: Nose normal.     Mouth/Throat:     Lips: Pink.     Mouth: Mucous membranes are dry. No angioedema.     Dentition: Normal dentition. No dental tenderness or gingival swelling.     Tongue: No lesions. Tongue does not deviate from midline.     Palate: No mass and lesions.     Pharynx: Oropharynx is clear. Uvula midline. No pharyngeal swelling or posterior oropharyngeal erythema.     Tonsils: No tonsillar exudate.     Comments: Dry cracking erythema around lips with mild swelling, with cracking at corners of mouth, no visible lesions or ulcers.  Eyes:     Extraocular Movements: Extraocular movements intact.     Pupils: Pupils are equal, round, and reactive to light.  Cardiovascular:     Rate and Rhythm: Normal rate and regular rhythm.  Pulmonary:     Effort: Pulmonary effort is normal.     Breath sounds: Normal breath sounds.  Abdominal:     General: Bowel sounds are normal.     Palpations: Abdomen is soft.  Neurological:     Mental Status: She is alert.           Assessment & Plan:   Diagnoses and all orders for this visit:  Perioral dermatitis -     metroNIDAZOLE (METROCREAM) 0.75 % cream; Apply topically 2 (two) times daily. If not better may refer to Derm or get labs in oct    Future Appointments  Date Time Provider Department Center  11/29/2019  8:45 AM Quentin Mulling, PA-C [facility] None  05/20/2020 10:00 AM Quentin Mulling, PA-C [facility] None

## 2019-09-17 ENCOUNTER — Other Ambulatory Visit: Payer: Self-pay | Admitting: Internal Medicine

## 2019-09-17 MED FILL — METFORMIN HCL ER 500 MG TB2: 500 | 90 days supply | Qty: 360 | Fill #3

## 2019-09-17 MED FILL — FLUCONAZOLE 150 MG TABS: 150 | 1 days supply | Qty: 1 | Fill #1

## 2019-09-18 MED FILL — ESOMEPRAZOLE MAG DR 40 MG C: 40 | 90 days supply | Qty: 90 | Fill #0

## 2019-09-25 ENCOUNTER — Other Ambulatory Visit: Payer: Self-pay | Admitting: Internal Medicine

## 2019-09-26 MED FILL — ESCITALOPRAM 20 MG TABLET: 20 | 90 days supply | Qty: 90 | Fill #0

## 2019-10-15 ENCOUNTER — Other Ambulatory Visit: Payer: Self-pay | Admitting: Physician Assistant

## 2019-10-15 MED FILL — ESTRADIOL 1 MG TABS: 1 | 90 days supply | Qty: 90 | Fill #0

## 2019-10-30 ENCOUNTER — Other Ambulatory Visit: Payer: Self-pay | Admitting: Internal Medicine

## 2019-10-30 ENCOUNTER — Other Ambulatory Visit: Payer: Self-pay | Admitting: Adult Health

## 2019-10-30 ENCOUNTER — Other Ambulatory Visit: Payer: Self-pay | Admitting: Physician Assistant

## 2019-10-30 MED FILL — PROPRANOLOL 80 MG TABLET: 80 | 90 days supply | Qty: 180 | Fill #0

## 2019-10-30 MED FILL — CANDESARTAN CILEXETIL 32 MG: 32 | 90 days supply | Qty: 90 | Fill #0

## 2019-10-30 MED FILL — SYNTHROID 137 MCG TABLET: 137 | 90 days supply | Qty: 90 | Fill #0

## 2019-11-01 MED FILL — FAMCICLOVIR 500 MG TABLET: 500 | 10 days supply | Qty: 30 | Fill #2

## 2019-11-26 MED FILL — aMILoride HCL 5 MG TABS: 5 | 90 days supply | Qty: 90 | Fill #1

## 2019-11-29 ENCOUNTER — Ambulatory Visit (INDEPENDENT_AMBULATORY_CARE_PROVIDER_SITE_OTHER): Payer: 59 | Admitting: Physician Assistant

## 2019-11-29 ENCOUNTER — Other Ambulatory Visit: Payer: Self-pay | Admitting: Physician Assistant

## 2019-11-29 ENCOUNTER — Other Ambulatory Visit: Payer: Self-pay

## 2019-11-29 ENCOUNTER — Encounter: Payer: Self-pay | Admitting: Physician Assistant

## 2019-11-29 VITALS — BP 124/74 | HR 63 | Temp 97.3°F | Wt 243.0 lb

## 2019-11-29 DIAGNOSIS — R7309 Other abnormal glucose: Secondary | ICD-10-CM | POA: Diagnosis not present

## 2019-11-29 DIAGNOSIS — F3341 Major depressive disorder, recurrent, in partial remission: Secondary | ICD-10-CM | POA: Diagnosis not present

## 2019-11-29 DIAGNOSIS — I10 Essential (primary) hypertension: Secondary | ICD-10-CM | POA: Diagnosis not present

## 2019-11-29 DIAGNOSIS — E7800 Pure hypercholesterolemia, unspecified: Secondary | ICD-10-CM | POA: Diagnosis not present

## 2019-11-29 DIAGNOSIS — E039 Hypothyroidism, unspecified: Secondary | ICD-10-CM | POA: Diagnosis not present

## 2019-11-29 MED ORDER — BUPROPION HCL ER (XL) 150 MG PO TB24: 150.0000 mg | ORAL_TABLET | ORAL | 2 refills | Status: DC

## 2019-11-29 MED FILL — buPROPion HCL ER (XL) 150 M: 150 | 30 days supply | Qty: 30 | Fill #0

## 2019-11-29 NOTE — Progress Notes (Signed)
Assessment and Plan:  Depression Continue lexapro, add on wellbutrin for winter/depression Discussed serotonin syndrome.    Hypertension -Continue medication, monitor blood pressure at home. Continue DASH diet.  Reminder to go to the ER if any CP, SOB, nausea, dizziness, severe HA, changes vision/speech, left arm numbness and tingling and jaw pain.   Cholesterol -Continue diet and exercise.   Vitamin D Def - continue medications.    Morbid Obesity with co morbidities- - follow up 4 months for progress monitoring - increase veggies, decrease carbs - long discussion about weight loss, diet, and exercise  Hypothyroidism -check TSH level, continue medications the same, reminded to take on an empty stomach 30-64mins before food.    Continue diet and meds as discussed. Further disposition pending results of labs.Over 30 minutes of exam, counseling, chart review, and critical decision making was performed Future Appointments  Date Time Provider Department Center  05/20/2020 10:00 AM McClanahan, Bella Kennedy, NP [facility] None    HPI 59 y.o. female  presents for 3 month follow up on hypertension, cholesterol, prediabetes, and vitamin D deficiency.   Her mom has dementia, brother won't help, her sister had a nervous break down. She has been working 40-50 hours a week, very tired, depressed, is crying.   Her blood pressure has been controlled at home, today their BP is BP: 124/74  She does not workout. She denies chest pain, shortness of breath, dizziness. BMI is Body mass index is 47.46 kg/m., she is working on diet and exercise. Wt Readings from Last 3 Encounters:  11/29/19 243 lb (110.2 kg)  05/18/19 239 lb 12.8 oz (108.8 kg)  11/02/18 238 lb (108 kg)     She is on cholesterol medication, lipitor 10 pill daily and denies myalgias. Her cholesterol is at goal. The cholesterol last visit was:   Lab Results  Component Value Date   CHOL 167 05/18/2019   HDL 51 05/18/2019   LDLCALC 89  05/18/2019   TRIG 177 (H) 05/18/2019   CHOLHDL 3.3 05/18/2019   She has been working on diet and exercise for prediabetes, and denies paresthesia of the feet, polydipsia, polyuria and visual disturbances.   Last A1C in the office was:  Lab Results  Component Value Date   HGBA1C 5.7 (H) 05/18/2019   Patient is on Vitamin D supplement, 5000 IU dailly.    Lab Results  Component Value Date   VD25OH 28 05/18/2019    She is on thyroid medication, she is not on biotin. Her medication was not changed last visit.  Lab Results  Component Value Date   TSH 0.97 05/18/2019      Current Medications:    Current Outpatient Medications (Endocrine & Metabolic):  .  estradiol (ESTRACE) 1 MG tablet, TAKE 1/2 TO 1 TABLET BY MOUTH ONCE DAILY FOR VASOMOTOR SYMPTOMS .  metFORMIN (GLUCOPHAGE-XR) 500 MG 24 hr tablet, TAKE 2 TABLETS BY MOUTH TWICE DAILY .  SYNTHROID 137 MCG tablet, TAKE 1 TABLET BY MOUTH DAILY ON AN EMPTY STOMACH WITH ONLY WATER FOR 30 MINUTES & NO ANTACID MEDS, CALCIUM OR MAGNESIUM FOR 4 HOURS & AVOID BIOTIN  Current Outpatient Medications (Cardiovascular):  .  aMILoride (MIDAMOR) 5 MG tablet, TAKE 1 TABLET BY MOUTH EVERY MORNING FOR BP & FLUID RETENTION / ANKLE SWELLING .  atorvastatin (LIPITOR) 20 MG tablet, TAKE 1 TABLET BY MOUTH ONCE DAILY .  candesartan (ATACAND) 32 MG tablet, TAKE 1 TABLET BY MOUTH AT NIGHT FOR BLOOD PRESSURE .  EPINEPHRINE 0.3 mg/0.3 mL IJ SOAJ  injection, USE AS DIRECTED .  propranolol (INDERAL) 80 MG tablet, TAKE 1 TABLET BY MOUTH TWICE A DAY IN THE MORNING AND IN THE EVENING FOR BLOOD PRESSURE  Current Outpatient Medications (Respiratory):  .  albuterol (PROVENTIL HFA;VENTOLIN HFA) 108 (90 Base) MCG/ACT inhaler, Inhale 2 puffs into the lungs every 6 (six) hours as needed for wheezing or shortness of breath. .  cetirizine (ZYRTEC) 10 MG tablet, Take 10 mg by mouth daily. .  fluticasone (FLONASE) 50 MCG/ACT nasal spray, Place 1 spray into both nostrils  daily.  Current Outpatient Medications (Analgesics):  .  aspirin 325 MG EC tablet, Take 325 mg by mouth every morning.  .  traMADol (ULTRAM) 50 MG tablet, Take 1 tablet (50 mg total) by mouth every 12 (twelve) hours as needed (cough).  Current Outpatient Medications (Hematological):  .  cyanocobalamin 2000 MCG tablet, Take 2,000 mcg by mouth daily.  Current Outpatient Medications (Other):  Marland Kitchen  Ascorbic Acid (VITAMIN C) 1000 MG tablet, Take 1,000 mg by mouth daily. Marland Kitchen  buPROPion (WELLBUTRIN XL) 150 MG 24 hr tablet, Take 1 tablet (150 mg total) by mouth every morning. .  Cholecalciferol (VITAMIN D-3) 5000 UNITS TABS, Take 5,000 Units by mouth daily. Marland Kitchen  escitalopram (LEXAPRO) 20 MG tablet, Take 1 tablet Daily for Mood .  esomeprazole (NEXIUM) 40 MG capsule, Take 1 capsule Daily to Prevent Heartburn & Indigestion .  famciclovir (FAMVIR) 500 MG tablet, Take 1 tablet (500 mg total) by mouth 3 (three) times daily. Take AD for fever blister .  Glucosamine-Chondroitin (GLUCOSAMINE CHONDR COMPLEX PO), Take 1,500 mg by mouth. 1500mg /1200mg   Actual dose .  ketoconazole (NIZORAL) 2 % cream, Apply 1 application topically 2 (two) times daily. .  metroNIDAZOLE (METROCREAM) 0.75 % cream, Apply topically 2 (two) times daily.  nystatin (MYCOSTATIN/NYSTOP) powder, Apply daily after a shower as needed .  oxybutynin (DITROPAN) 5 MG tablet, Take 1 tablet (5 mg total) by mouth 2 (two) times daily as needed (sweating). .  phentermine (ADIPEX-P) 37.5 MG tablet, Take 1 tablet (37.5 mg total) by mouth daily before breakfast. (Patient not taking: Reported on 05/18/2019) .  triamcinolone cream (KENALOG) 0.5 %, Apply 1 application topically 2 (two) times daily.  Medical History:  Past Medical History:  Diagnosis Date  . GERD (gastroesophageal reflux disease)   . High cholesterol   . Hypertension   . Hypothyroidism   . Multinodular goiter    Allergies:  Allergies  Allergen Reactions  . Avelox [Moxifloxacin Hcl In  Nacl] Hives    Thrush, throat might have closed   . Hydrochlorothiazide Itching    On hands and feet  . Other Itching    All "Cillins", hives alao  . Penicillins     Hives  . Prednisone     Chest pains  . Zostavax [Zoster Vaccine Live]   . Codeine Hives, Itching and Rash  . Levothyroxine Rash    Only to generic. Not to brand synthroid.   . Sulfa Antibiotics Itching and Rash    Hands and feet itch and turn red    Review of Systems:  Review of Systems  Constitutional: Negative.   HENT: Negative.   Eyes: Negative.   Respiratory: Negative.   Cardiovascular: Negative for chest pain, palpitations, orthopnea, claudication, leg swelling and PND.  Gastrointestinal: Negative.   Genitourinary: Negative.   Musculoskeletal: Positive for joint pain (bilateral knees).  Skin: Negative.   Neurological: Negative.   Endo/Heme/Allergies: Negative.   Psychiatric/Behavioral: Negative.     Family  history- Review and unchanged Social history- Review and unchanged Physical Exam: BP 124/74   Pulse 63   Temp (!) 97.3 F (36.3 C)   Wt 243 lb (110.2 kg)   SpO2 98%   BMI 47.46 kg/m  Wt Readings from Last 3 Encounters:  11/29/19 243 lb (110.2 kg)  05/18/19 239 lb 12.8 oz (108.8 kg)  11/02/18 238 lb (108 kg)   General Appearance: Well nourished, in no apparent distress. Eyes: PERRLA, EOMs, conjunctiva no swelling or erythema Sinuses: No Frontal/maxillary tenderness ENT/Mouth: Ext aud canals clear, TMs without erythema, bulging. No erythema, swelling, or exudate on post pharynx.  Tonsils not swollen or erythematous. Hearing normal.  Neck: Supple, thyroid normal.  Respiratory: Respiratory effort normal, BS equal bilaterally without rales, rhonchi, wheezing or stridor.  Cardio: RRR with no MRGs. Brisk peripheral pulses with 2+ edema.  Abdomen: Soft, + BS, obese,  Non tender, no guarding, rebound, hernias, masses. Lymphatics: Non tender without lymphadenopathy.  Musculoskeletal: Full ROM, 5/5  strength Skin: Warm, dry without rashes, lesions, ecchymosis.  Neuro: Cranial nerves intact. Normal muscle tone, no cerebellar symptoms. Psych: Awake and oriented X 3, normal affect, Insight and Judgment appropriate.    Quentin Mulling, PA-C  9:20 AM [facility]

## 2019-11-29 NOTE — Patient Instructions (Addendum)
Will start you on wellbutrin to try to help with mood/weight loss If you get nausea and HA after the first week please stop If you get anxious or snappy with people than stop the medication But this medication can help with energy, weight loss and mood It kicks in about 1-2 weeks And can be stopped quickly  Please be aware that some of the medications that you are on can sometimes cause a rare and potentially dangerous adverse reaction, called SEROTONIN SYNDROME: Symptoms of this condition include (but are not limited to):  Agitation or restlessness, confusion, rapid heart rate and high blood pressure, dilated pupils, loss of muscle coordination or twitching muscles, muscle rigidity/stiffness, sweating and/or flushing, diarrhea, headache, shivering, goose bumps. If you have any of these symptoms you may have to stop the medication. Call your health care provider immediately.  Severe serotonin syndrome can be life-threatening emergency. Signs and symptoms of a severe reaction may include: high fever, seizures, irregular heartbeat, unconsciousness or altered level of awareness or personality changes.  If you have any of these new symptoms, call 911 or have someone take you to the emergency room.    Counseling services  I suggest calling your insurance and finding out who is in your network and THEN calling those people or looking them up on google.   I'm a big fan of Cognitive Behavioral Therapy, look this up on You tube or check with the therapist you see if they are certified.  This form of therapy helps to teach you skills to better handle with current situation that are causing anxiety or depression.   There are some great apps too Check out GTHX, give thanks app.  Meditations apps are great like headspace.   General eating tips  What to Avoid . Avoid added sugars o Often added sugar can be found in processed foods such as many condiments, dry cereals, cakes, cookies, chips, crisps,  crackers, candies, sweetened drinks, etc.  o Read labels and AVOID/DECREASE use of foods with the following in their ingredient list: Sugar, fructose, high fructose corn syrup, sucrose, glucose, maltose, dextrose, molasses, cane sugar, brown sugar, any type of syrup, agave nectar, etc.   . Avoid snacking in between meals- drink water or if you feel you need a snack, pick a high water content snack such as cucumbers, watermelon, or any veggie.  Marland Kitchen Avoid foods made with flour o If you are going to eat food made with flour, choose those made with whole-grains; and, minimize your consumption as much as is tolerable . Avoid processed foods o These foods are generally stocked in the middle of the grocery store.  o Focus on shopping on the perimeter of the grocery.  What to Include . Vegetables o GREEN LEAFY VEGETABLES: Kale, spinach, mustard greens, collard greens, cabbage, broccoli, etc. o OTHER: Asparagus, cauliflower, eggplant, carrots, peas, Brussel sprouts, tomatoes, bell peppers, zucchini, beets, cucumbers, etc. . Grains, seeds, and legumes o Beans: kidney beans, black eyed peas, garbanzo beans, black beans, pinto beans, etc. o Whole, unrefined grains: brown rice, barley, bulgur, oatmeal, etc. . Healthy fats  o Avoid highly processed fats such as vegetable oil o Examples of healthy fats: avocado, olives, virgin olive oil, dark chocolate (?72% Cocoa), nuts (peanuts, almonds, walnuts, cashews, pecans, etc.) o Please still do small amount of these healthy fats, they are dense in calories.  . Low - Moderate Intake of Animal Sources of Protein o Meat sources: chicken, [place], salmon, tuna. Limit to 4 ounces of  meat at one time or the size of your palm. o Consider limiting dairy sources, but when choosing dairy focus on: PLAIN [place] yogurt, cottage cheese, high-protein milk . Fruit o Choose berries

## 2019-11-30 LAB — LIPID PANEL
Cholesterol: 157 mg/dL (ref <200)
HDL: 54 mg/dL (ref >=50)
LDL Cholesterol (Calc): 76 mg/dL (calc)
Non-HDL Cholesterol (Calc): 103 mg/dL (calc) (ref <130)
Total CHOL/HDL Ratio: 2.9 (calc) (ref <5.0)
Triglycerides: 173 mg/dL — ABNORMAL HIGH (ref <150)

## 2019-11-30 LAB — TSH: TSH: 1.48 mIU/L (ref 0.40–4.50)

## 2019-11-30 LAB — CBC WITH DIFFERENTIAL/PLATELET
Absolute Monocytes: 682 cells/uL (ref 200–950)
Basophils Absolute: 110 cells/uL (ref 0–200)
Basophils Relative: 1 %
Eosinophils Absolute: 616 cells/uL — ABNORMAL HIGH (ref 15–500)
Eosinophils Relative: 5.6 %
HCT: 39.9 % (ref 35.0–45.0)
Hemoglobin: 13.1 g/dL (ref 11.7–15.5)
Lymphs Abs: 2959 cells/uL (ref 850–3900)
MCH: 28.7 pg (ref 27.0–33.0)
MCHC: 32.8 g/dL (ref 32.0–36.0)
MCV: 87.3 fL (ref 80.0–100.0)
MPV: 11.2 fL (ref 7.5–12.5)
Monocytes Relative: 6.2 %
Neutro Abs: 6633 cells/uL (ref 1500–7800)
Neutrophils Relative %: 60.3 %
Platelets: 302 10*3/uL (ref 140–400)
RBC: 4.57 10*6/uL (ref 3.80–5.10)
RDW: 12.6 % (ref 11.0–15.0)
Total Lymphocyte: 26.9 %
WBC: 11 10*3/uL — ABNORMAL HIGH (ref 3.8–10.8)

## 2019-11-30 LAB — HEMOGLOBIN A1C
Hgb A1c MFr Bld: 5.8 % of total Hgb — ABNORMAL HIGH (ref <5.7)
Mean Plasma Glucose: 120 (calc)
eAG (mmol/L): 6.6 (calc)

## 2019-11-30 LAB — COMPLETE METABOLIC PANEL WITH GFR
AG Ratio: 1.7 (calc) (ref 1.0–2.5)
ALT: 16 U/L (ref 6–29)
AST: 18 U/L (ref 10–35)
Albumin: 4.1 g/dL (ref 3.6–5.1)
Alkaline phosphatase (APISO): 81 U/L (ref 37–153)
BUN: 14 mg/dL (ref 7–25)
CO2: 25 mmol/L (ref 20–32)
Calcium: 10.3 mg/dL (ref 8.6–10.4)
Chloride: 105 mmol/L (ref 98–110)
Creat: 0.68 mg/dL (ref 0.50–0.99)
GFR, Est African American: 110 mL/min/{1.73_m2} (ref >=60)
GFR, Est Non African American: 95 mL/min/{1.73_m2} (ref >=60)
Globulin: 2.4 g/dL (calc) (ref 1.9–3.7)
Glucose, Bld: 90 mg/dL (ref 65–99)
Potassium: 5.2 mmol/L (ref 3.5–5.3)
Sodium: 140 mmol/L (ref 135–146)
Total Bilirubin: 0.3 mg/dL (ref 0.2–1.2)
Total Protein: 6.5 g/dL (ref 6.1–8.1)

## 2019-12-06 DIAGNOSIS — H52223 Regular astigmatism, bilateral: Secondary | ICD-10-CM | POA: Diagnosis not present

## 2019-12-06 DIAGNOSIS — H5203 Hypermetropia, bilateral: Secondary | ICD-10-CM | POA: Diagnosis not present

## 2019-12-06 DIAGNOSIS — H524 Presbyopia: Secondary | ICD-10-CM | POA: Diagnosis not present

## 2019-12-17 ENCOUNTER — Other Ambulatory Visit: Payer: Self-pay | Admitting: Internal Medicine

## 2019-12-17 DIAGNOSIS — K1379 Other lesions of oral mucosa: Secondary | ICD-10-CM

## 2019-12-17 MED ORDER — FAMCICLOVIR 500 MG PO TABS: ORAL_TABLET | ORAL | 3 refills | Status: DC

## 2019-12-17 MED FILL — ESOMEPRAZOLE MAG DR 40 MG C: 40 | 90 days supply | Qty: 90 | Fill #1

## 2019-12-17 MED FILL — METFORMIN HCL ER 500 MG TB2: 500 | 90 days supply | Qty: 360 | Fill #4

## 2019-12-18 MED FILL — ESCITALOPRAM 20 MG TABLET: 20 | 90 days supply | Qty: 90 | Fill #0

## 2019-12-18 MED FILL — FAMCICLOVIR 500 MG TABLET: 500 | 10 days supply | Qty: 30 | Fill #0

## 2019-12-20 DIAGNOSIS — K130 Diseases of lips: Secondary | ICD-10-CM | POA: Diagnosis not present

## 2019-12-25 DIAGNOSIS — Z1231 Encounter for screening mammogram for malignant neoplasm of breast: Secondary | ICD-10-CM | POA: Diagnosis not present

## 2019-12-25 LAB — HM MAMMOGRAPHY

## 2020-01-14 MED FILL — buPROPion HCL ER (XL) 150 M: 150 | 30 days supply | Qty: 30 | Fill #1

## 2020-02-07 ENCOUNTER — Other Ambulatory Visit: Payer: Self-pay

## 2020-02-07 MED ORDER — ATORVASTATIN CALCIUM 20 MG PO TABS
20.0000 mg | ORAL_TABLET | Freq: Every day | ORAL | 0 refills | Status: DC
Start: 2020-02-07 — End: 2020-02-07

## 2020-02-07 MED FILL — buPROPion HCL ER (XL) 150 M: 150 | 30 days supply | Qty: 30 | Fill #2

## 2020-02-07 MED FILL — ATORVASTATIN CALCIUM 20 MG: 20 | 90 days supply | Qty: 90 | Fill #0

## 2020-02-07 MED FILL — PROPRANOLOL 80 MG TABLET: 80 | 90 days supply | Qty: 180 | Fill #1

## 2020-02-21 ENCOUNTER — Other Ambulatory Visit: Payer: Self-pay | Admitting: Internal Medicine

## 2020-02-21 DIAGNOSIS — I10 Essential (primary) hypertension: Secondary | ICD-10-CM

## 2020-02-21 MED ORDER — AMILORIDE HCL 5 MG PO TABS: ORAL_TABLET | ORAL | 1 refills | Status: DC

## 2020-02-21 MED ORDER — AMILORIDE HCL 5 MG PO TABS: ORAL_TABLET | ORAL | 0 refills | Status: DC

## 2020-02-21 MED FILL — aMILoride HCL 5 MG TABS: 5 | 90 days supply | Qty: 90 | Fill #0

## 2020-02-21 MED FILL — SYNTHROID 137 MCG TABLET: 137 | 90 days supply | Qty: 90 | Fill #1

## 2020-03-25 ENCOUNTER — Other Ambulatory Visit: Payer: Self-pay | Admitting: Internal Medicine

## 2020-03-25 ENCOUNTER — Other Ambulatory Visit: Payer: Self-pay | Admitting: Adult Health Nurse Practitioner

## 2020-03-25 MED FILL — ESOMEPRAZOLE MAG DR 40 MG C: 40 | 90 days supply | Qty: 90 | Fill #0

## 2020-03-25 MED FILL — METFORMIN HCL ER 500 MG TB2: 500 | 90 days supply | Qty: 360 | Fill #0

## 2020-03-25 MED FILL — ESCITALOPRAM 20 MG TABLET: 20 | 90 days supply | Qty: 90 | Fill #0

## 2020-03-31 ENCOUNTER — Encounter: Payer: 59 | Admitting: Physician Assistant

## 2020-04-21 ENCOUNTER — Other Ambulatory Visit: Payer: Self-pay | Admitting: Adult Health

## 2020-04-21 MED FILL — CANDESARTAN CILEXETIL 32 MG: 32 | 90 days supply | Qty: 90 | Fill #1

## 2020-05-01 ENCOUNTER — Other Ambulatory Visit: Payer: Self-pay | Admitting: Adult Health Nurse Practitioner

## 2020-05-01 ENCOUNTER — Other Ambulatory Visit: Payer: Self-pay | Admitting: Adult Health

## 2020-05-01 DIAGNOSIS — F3341 Major depressive disorder, recurrent, in partial remission: Secondary | ICD-10-CM

## 2020-05-01 MED FILL — PROPRANOLOL 80 MG TABLET: 80 | 90 days supply | Qty: 180 | Fill #0

## 2020-05-01 MED FILL — buPROPion HCL ER (XL) 150 M: 150 | 90 days supply | Qty: 90 | Fill #0

## 2020-05-13 MED FILL — SYNTHROID 137 MCG TABLET: 137 | 90 days supply | Qty: 90 | Fill #0

## 2020-05-20 ENCOUNTER — Encounter: Payer: 59 | Admitting: Adult Health Nurse Practitioner

## 2020-05-28 ENCOUNTER — Other Ambulatory Visit (HOSPITAL_COMMUNITY): Payer: Self-pay

## 2020-05-28 MED FILL — Amiloride HCl Tab 5 MG: ORAL | 90 days supply | Qty: 90 | Fill #0 | Status: AC

## 2020-06-17 ENCOUNTER — Other Ambulatory Visit: Payer: Self-pay

## 2020-06-17 ENCOUNTER — Other Ambulatory Visit (HOSPITAL_COMMUNITY): Payer: Self-pay

## 2020-06-19 ENCOUNTER — Other Ambulatory Visit: Payer: Self-pay | Admitting: Internal Medicine

## 2020-06-19 ENCOUNTER — Other Ambulatory Visit (HOSPITAL_COMMUNITY): Payer: Self-pay

## 2020-06-19 MED ORDER — ESCITALOPRAM OXALATE 20 MG PO TABS
20.0000 mg | ORAL_TABLET | Freq: Every day | ORAL | 3 refills | Status: DC
  Filled 2020-06-19 – 2020-07-01 (×2): qty 90, 90d supply, fill #0
  Filled 2020-09-23: qty 90, 90d supply, fill #1
  Filled 2020-12-31: qty 90, 90d supply, fill #2
  Filled 2021-03-26: qty 90, 90d supply, fill #3

## 2020-06-27 ENCOUNTER — Other Ambulatory Visit (HOSPITAL_COMMUNITY): Payer: Self-pay

## 2020-07-01 ENCOUNTER — Other Ambulatory Visit (HOSPITAL_COMMUNITY): Payer: Self-pay

## 2020-07-01 MED FILL — Metformin HCl Tab ER 24HR 500 MG: ORAL | 90 days supply | Qty: 360 | Fill #0 | Status: AC

## 2020-07-01 MED FILL — Esomeprazole Magnesium Cap Delayed Release 40 MG (Base Eq): ORAL | 90 days supply | Qty: 90 | Fill #0 | Status: AC

## 2020-07-25 ENCOUNTER — Other Ambulatory Visit: Payer: Self-pay

## 2020-07-25 ENCOUNTER — Ambulatory Visit (INDEPENDENT_AMBULATORY_CARE_PROVIDER_SITE_OTHER): Payer: 59 | Admitting: Adult Health

## 2020-07-25 ENCOUNTER — Encounter: Payer: Self-pay | Admitting: Adult Health

## 2020-07-25 VITALS — BP 120/72 | HR 60 | Temp 97.5°F | Ht 60.25 in | Wt 241.0 lb

## 2020-07-25 DIAGNOSIS — Z136 Encounter for screening for cardiovascular disorders: Secondary | ICD-10-CM | POA: Diagnosis not present

## 2020-07-25 DIAGNOSIS — Z0000 Encounter for general adult medical examination without abnormal findings: Secondary | ICD-10-CM

## 2020-07-25 DIAGNOSIS — R7309 Other abnormal glucose: Secondary | ICD-10-CM

## 2020-07-25 DIAGNOSIS — Z1389 Encounter for screening for other disorder: Secondary | ICD-10-CM

## 2020-07-25 DIAGNOSIS — K2100 Gastro-esophageal reflux disease with esophagitis, without bleeding: Secondary | ICD-10-CM | POA: Diagnosis not present

## 2020-07-25 DIAGNOSIS — Z79899 Other long term (current) drug therapy: Secondary | ICD-10-CM | POA: Diagnosis not present

## 2020-07-25 DIAGNOSIS — Z131 Encounter for screening for diabetes mellitus: Secondary | ICD-10-CM

## 2020-07-25 DIAGNOSIS — I10 Essential (primary) hypertension: Secondary | ICD-10-CM | POA: Diagnosis not present

## 2020-07-25 DIAGNOSIS — F3341 Major depressive disorder, recurrent, in partial remission: Secondary | ICD-10-CM

## 2020-07-25 DIAGNOSIS — E039 Hypothyroidism, unspecified: Secondary | ICD-10-CM

## 2020-07-25 DIAGNOSIS — E559 Vitamin D deficiency, unspecified: Secondary | ICD-10-CM | POA: Diagnosis not present

## 2020-07-25 DIAGNOSIS — E785 Hyperlipidemia, unspecified: Secondary | ICD-10-CM

## 2020-07-25 MED ORDER — BUPROPION HCL ER (XL) 150 MG PO TB24: ORAL_TABLET | Freq: Every morning | ORAL | 3 refills | Status: DC

## 2020-07-25 MED ORDER — ATORVASTATIN CALCIUM 10 MG PO TABS: 10.0000 mg | ORAL_TABLET | Freq: Every day | ORAL | 3 refills | Status: DC

## 2020-07-25 NOTE — Progress Notes (Signed)
Complete Physical  Assessment and Plan:   Encounter for general adult medical examination with abnormal findings Due annually   Essential hypertension - continue medications, DASH diet, exercise and monitor at home. Call if greater than 130/80.  - CBC with Differential/Platelet - CMP/GFR - Magnesium  - Urinalysis, Routine w reflex microscopic (not at [facility]) - Microalbumin / creatinine urine ratio - EKG 12-Lead  Hypothyroidism, unspecified hypothyroidism type Hypothyroidism-check TSH level, continue medications the same, reminded to take on an empty stomach 30-3160mins before food.  - TSH  Morbid obesity, unspecified obesity type (HCC) - BMI 46 Obesity with co morbidities- long discussion about weight loss, diet, and exercise - follow up 6 months for progress monitoring - increase veggies, decrease carbs - long discussion about weight loss, diet, and exercise - discussed working on stress/workload, sleep quality, small changes  High cholesterol -continue medications, check lipids, decrease fatty foods, increase activity.  - Lipid panel   Medication management - Magnesium   Hot flashes, menopausal  Continue lexapro Off of estrogen and doing well  Weight loss encouraged   Vitamin D deficiency - VITAMIN D 25 Hydroxy (Vit-D Deficiency, Fractures)  Gastroesophageal reflux disease with esophagitis Continue PPI/H2 blocker, diet discussed   Orders Placed This Encounter  Procedures  . CBC with Differential/Platelet  . COMPLETE METABOLIC PANEL WITH GFR  . Magnesium  . Lipid panel  . TSH  . Hemoglobin A1c  . VITAMIN D 25 Hydroxy (Vit-D Deficiency, Fractures)  . Microalbumin / creatinine urine ratio  . Urinalysis, Routine w reflex microscopic  . EKG 12-Lead     Discussed med's effects and SE's. Screening labs and tests as requested with regular follow-up as recommended. Future Appointments  Date Time Provider Department Center  01/29/2021  9:30 AM Judd Gaudierorbett, Dickson Kostelnik, NP  [facility] None  07/27/2021 10:00 AM Judd Gaudierorbett, Amarius Toto, NP [facility] None    HPI  60 y.o. female  presents for a complete physical. She has Bilateral leg and foot pain; Morbid obesity (HCC); Biliary dyskinesia; Hypertension; GERD (gastroesophageal reflux disease); Hypothyroidism; Hyperlipidemia; Chronic cough; Abnormal glucose; Hot flashes, menopausal; Vitamin D deficiency; and Medication management on their problem list.  She is married, no children, has labs and chickens. She works [employer] at [employer], enjoys her job.   Denies any concerns.   She has hot flashes, is on lexapro 20 mg, low dose estrogen, 1/2 tablet just recently reduced this week and bASA.   She does have bil knee pain, follows with Dr. Magnus IvanBlackman.   BMI is Body mass index is 46.68 kg/m., she has not been working on diet and exercise. She has been working 13-14 hour days, works 3rd shift, she admits to not drinking enough water and eating too much sugar. She is stressed with her mother with possible dementia, no help from her family. Has not been taking the phentermine.  Wt Readings from Last 3 Encounters:  07/25/20 241 lb (109.3 kg)  11/29/19 243 lb (110.2 kg)  05/18/19 239 lb 12.8 oz (108.8 kg)    Her blood pressure has been controlled at home, today their BP is BP: 120/72 She does not workout due to time restraints.  She denies chest pain, shortness of breath, dizziness.   She is on cholesterol medication, lipitor 10 daily and denies myalgias. Her cholesterol is at goal. The cholesterol last visit was:   Lab Results  Component Value Date   CHOL 157 11/29/2019   HDL 54 11/29/2019   LDLCALC 76 11/29/2019   TRIG 173 (H) 11/29/2019  CHOLHDL 2.9 11/29/2019    She has been working on diet and exercise for prediabetes, she is on metoformin 4 pill a day, and denies paresthesia of the feet, polydipsia, polyuria and visual disturbances. Last A1C in the office was:  Lab Results  Component Value Date   HGBA1C 5.8 (H)  11/29/2019   Patient is on Vitamin D supplement, on 1000 IU daily Lab Results  Component Value Date   VD25OH 19 05/18/2019     She is on thyroid medication. Her medication was not changed last visit.  137 mcg daily.  Lab Results  Component Value Date   TSH 1.48 11/29/2019  .   Current Medications:   Current Outpatient Medications (Endocrine & Metabolic):  .  metFORMIN (GLUCOPHAGE-XR) 500 MG 24 hr tablet, TAKE 2 TABLETS BY MOUTH TWICE A DAY. .  SYNTHROID 137 MCG tablet, TAKE 1 TAB BY MOUTH DAILY ON AN EMPTY STOMACH WITH ONLY WATER FOR 30 MINUTES & NO ANTACID MEDS, CALCIUM, MAGNESIUM FOR 4 HOURS & AVOID BIOTIN  Current Outpatient Medications (Cardiovascular):  .  aMILoride (MIDAMOR) 5 MG tablet, TAKE 1 TABLET BY MOUTH ONCE DAILY FOR BLOOD PRESSURE, FLUID RETENTION AND ANKLE SWELLING. Marland Kitchen  atorvastatin (LIPITOR) 10 MG tablet, Take 1 tablet (10 mg total) by mouth daily. .  candesartan (ATACAND) 32 MG tablet, TAKE 1 TABLET BY MOUTH AT NIGHT FOR BLOOD PRESSURE. Marland Kitchen  EPINEPHRINE 0.3 mg/0.3 mL IJ SOAJ injection, USE AS DIRECTED .  propranolol (INDERAL) 80 MG tablet, TAKE 1 TABLET BY MOUTH TWICE A DAY IN THE MORNING AND IN THE EVENING FOR BLOOD PRESSURE  Current Outpatient Medications (Respiratory):  .  albuterol (PROVENTIL HFA;VENTOLIN HFA) 108 (90 Base) MCG/ACT inhaler, Inhale 2 puffs into the lungs every 6 (six) hours as needed for wheezing or shortness of breath. .  cetirizine (ZYRTEC) 10 MG tablet, Take 10 mg by mouth daily. .  fluticasone (FLONASE) 50 MCG/ACT nasal spray, Place 1 spray into both nostrils daily.  Current Outpatient Medications (Analgesics):  .  aspirin EC 81 MG tablet, Take 81 mg by mouth daily. Swallow whole. .  traMADol (ULTRAM) 50 MG tablet, Take 1 tablet (50 mg total) by mouth every 12 (twelve) hours as needed (cough).  Current Outpatient Medications (Hematological):  .  cyanocobalamin 2000 MCG tablet, Take 2,000 mcg by mouth daily.  Current Outpatient Medications  (Other):  Marland Kitchen  Ascorbic Acid (VITAMIN C) 1000 MG tablet, Take 1,000 mg by mouth daily. .  Cholecalciferol (VITAMIN D-3) 5000 UNITS TABS, Take 5,000 Units by mouth daily. Marland Kitchen  escitalopram (LEXAPRO) 20 MG tablet, Take 1 tablet (20 mg total) by mouth daily for mood. Marland Kitchen  esomeprazole (NEXIUM) 40 MG capsule, TAKE 1 CAPSULE BY MOUTH ONCE DAILY TO TO PREVENT HEARTBURN AND INDIGESTION. .  famciclovir (FAMVIR) 500 MG tablet, TAKE 1 TABLET THREE TIMES DAILY FOR FEVER BLISTERS .  Glucosamine-Chondroitin (GLUCOSAMINE CHONDR COMPLEX PO), Take 1,500 mg by mouth. 1500mg /1200mg   Actual dose .  ketoconazole (NIZORAL) 2 % cream, Apply 1 application topically 2 (two) times daily. .  metroNIDAZOLE (METROCREAM) 0.75 % cream, Apply topically 2 (two) times daily.  nystatin (MYCOSTATIN/NYSTOP) powder, Apply daily after a shower as needed .  oxybutynin (DITROPAN) 5 MG tablet, Take 1 tablet (5 mg total) by mouth 2 (two) times daily as needed (sweating). .  triamcinolone cream (KENALOG) 0.5 %, Apply 1 application topically 2 (two) times daily. Marland Kitchen  buPROPion (WELLBUTRIN XL) 150 MG 24 hr tablet, TAKE 1 TABLET (150 MG TOTAL) BY MOUTH EVERY  MORNING. Marland Kitchen  phentermine (ADIPEX-P) 37.5 MG tablet, Take 1 tablet (37.5 mg total) by mouth daily before breakfast. (Patient not taking: No sig reported)  Health Maintenance:   Immunization History  Administered Date(s) Administered  . DTaP 12/07/2012  . Influenza-Unspecified 11/22/2012, 11/12/2014, 11/17/2017  . Pneumococcal Polysaccharide-23 02/22/2009  . Td 02/23/2000  . Tdap 12/07/2012  . Zoster, Live 10/29/2010   Tetanus: 2014 Pneumovax: 2011 Flu vaccine: 10/[facility] Prevnar 13: N/A Zostavax: 2012, had a reaction, declines shringrix COVID 19: 2/2, will send photo  LMP: 2005 p. Hysterectomy, 1 ovary and cervix Pap: 02/2018 due 5 years,  + stress incontinence MGM: 12/2019 at [facility], report requested DEXA: N/A due age 64  TB gold- negative Colonoscopy: 04/2014, Dr. Madilyn Fireman  due 10 years.  EGD: N/A  US thyroid and BX 2013, Dr. Sharl Ma.   Last Dental Exam: Dr. Raford Pitcher, goes q75m, has scheduled next week  Last Eye Exam: Dr. Charise Killian, glasses, goes annually   Patient Care Team: Lucky Cowboy, MD as PCP - General (Internal Medicine) Dorena Cookey, MD (Inactive) as Consulting Physician (Gastroenterology) Talmage Coin, MD as Consulting Physician (Endocrinology)  Medical History:  Past Medical History:  Diagnosis Date  . GERD (gastroesophageal reflux disease)   . High cholesterol   . Hypertension   . Hypothyroidism   . Multinodular goiter    Allergies Allergies  Allergen Reactions  . Avelox [Moxifloxacin Hcl In Nacl] Hives    Thrush, throat might have closed   . Hydrochlorothiazide Itching    On hands and feet  . Other Itching    All "Cillins", hives alao  . Penicillins     Hives  . Prednisone     Chest pains  . Zostavax [Zoster Vaccine Live]   . Codeine Hives, Itching and Rash  . Levothyroxine Rash    Only to generic. Not to brand synthroid.   . Sulfa Antibiotics Itching and Rash    Hands and feet itch and turn red    SURGICAL HISTORY She  has a past surgical history that includes Carpal tunnel release (yrs ago); Knee surgery (arthroscopic, 3 yrs ago); Tonsillectomy (age 45); Abdominal hysterectomy (8 yrs ago); Tubal ligation (yrs ago); radioactive iodine to thyroid' (march 2013); and Cholecystectomy (12/28/2011). FAMILY HISTORY Her family history includes AAA (abdominal aortic aneurysm) in her father; Alcoholism in her sister; Breast cancer (age of onset: 2) in her mother; CAD in her brother; Heart attack in her father and paternal grandmother; Heart attack (age of onset: 58) in her brother; Stroke in her father and mother; Suicidality in her maternal grandfather and maternal grandmother. SOCIAL HISTORY She  reports that she has never smoked. She has never used smokeless tobacco. She reports that she does not drink alcohol and does not use  drugs.  Review of Systems  Constitutional: Negative.  Negative for chills, fever, malaise/fatigue and weight loss.  HENT: Negative.  Negative for hearing loss and tinnitus.   Eyes: Negative.  Negative for blurred vision and double vision.  Respiratory: Negative.  Negative for cough, shortness of breath and wheezing.   Cardiovascular: Negative.  Negative for chest pain, palpitations, orthopnea, claudication and leg swelling.  Gastrointestinal: Negative.  Negative for abdominal pain, blood in stool, constipation, diarrhea, heartburn, melena, nausea and vomiting.  Genitourinary: Negative.  Negative for dysuria, flank pain, frequency, hematuria and urgency.  Musculoskeletal: Positive for joint pain. Negative for back pain, falls, myalgias and neck pain.  Skin: Negative for rash.  Neurological: Negative.  Negative for dizziness, tingling, sensory  change, weakness and headaches.  Endo/Heme/Allergies: Negative.  Negative for polydipsia.  Psychiatric/Behavioral: Negative.  Negative for depression. The patient does not have insomnia.   All other systems reviewed and are negative.    Physical Exam: Estimated body mass index is 46.68 kg/m as calculated from the following:   Height as of this encounter: 5' 0.25" (1.53 m).   Weight as of this encounter: 241 lb (109.3 kg). BP 120/72   Pulse 60   Temp (!) 97.5 F (36.4 C)   Ht 5' 0.25" (1.53 m)   Wt 241 lb (109.3 kg)   SpO2 97%   BMI 46.68 kg/m  General Appearance: Well nourished, in no apparent distress.  Eyes: PERRLA, EOMs, conjunctiva no swelling or erythema Sinuses: No Frontal/maxillary tenderness  ENT/Mouth: Ext aud canals clear, normal light reflex with TMs without erythema, bulging. Good dentition. No erythema, swelling, or exudate on post pharynx. Tonsils not swollen or erythematous. Hearing normal.  Neck: Supple, thyroid normal. No bruits  Respiratory: Respiratory effort normal, BS equal bilaterally without rales, rhonchi, wheezing or  stridor.  Cardio: RRR without murmurs, rubs or gallops. Brisk peripheral pulses without edema.  Chest: symmetric, with normal excursions and percussion.  Breasts: Declined today Abdomen: Soft, nontender, morbidly obese limiting exam, no guarding, rebound, hernias, masses, or organomegaly. .  Lymphatics: Non tender without lymphadenopathy.  Genitourinary: defer Musculoskeletal: Limited exam by body habitus, symmetrical ROM all peripheral extremities,5/5 strength, and normal gait.  Skin: Warm, dry without rashes, lesions, ecchymosis. Neuro: Cranial nerves intact, reflexes equal bilaterally. Normal muscle tone, no cerebellar symptoms. Sensation intact.  Psych: Awake and oriented X 3, normal affect, Insight and Judgment appropriate.   EKG: Sinus bradycardia, NSCPT  AORTA SCAN:  Get at age 1, family hx of AAA in father   Dan Maker 11:10 AM [facility]

## 2020-07-25 NOTE — Patient Instructions (Addendum)
Norma Sanchez , Thank you for taking time to come for your Annual Wellness Visit. I appreciate your ongoing commitment to your health goals. Please review the following plan we discussed and let me know if I can assist you in the future.   This is a list of the screening recommended for you and due dates:  Health Maintenance  Topic Date Due  . COVID-19 Vaccine (1) Never done  . Mammogram  12/19/2019  . Pneumococcal Vaccination (1 of 4 - PCV13) 07/25/2025*  . Flu Shot  09/22/2020  . Tetanus Vaccine  12/08/2022  . Pap Smear  02/25/2023  . Colon Cancer Screening  05/06/2024  . Hepatitis C Screening: USPSTF Recommendation to screen - Ages 52-79 yo.  Completed  . HIV Screening  Completed  . HPV Vaccine  Aged Out  . Zoster (Shingles) Vaccine  Discontinued  *Topic was postponed. The date shown is not the original due date.    Please sent covid 19 vaccine information on mychart Will request mammogram report     Aim for 5-7+ servings of fruits and vegetables daily  65-80+ fluid ounces of water or unsweet tea for healthy kidneys  Limit to max 1 drink of alcohol per day; avoid smoking/tobacco  Limit animal fats in diet for cholesterol and heart health - choose grass fed whenever available  Avoid highly processed foods, and foods high in saturated/trans fats  Aim for low stress - take time to unwind and care for your mental health  Aim for 150 min of moderate intensity exercise weekly for heart health, and weights twice weekly for bone health  Aim for 7-9 hours of sleep daily     Coronary Calcium Scan A coronary calcium scan is an imaging test used to look for deposits of plaque in the inner lining of the blood vessels of the heart (coronary arteries). Plaque is made up of calcium, protein, and fatty substances. These deposits of plaque can partly clog and narrow the coronary arteries without producing any symptoms or warning signs. This puts a person at risk for a heart attack. This  test is recommended for people who are at moderate risk for heart disease. The test can find plaque deposits before symptoms develop. Tell a health care provider about:  Any allergies you have.  All medicines you are taking, including vitamins, herbs, eye drops, creams, and over-the-counter medicines.  Any problems you or family members have had with anesthetic medicines.  Any blood disorders you have.  Any surgeries you have had.  Any medical conditions you have.  Whether you are pregnant or may be pregnant. What are the risks? Generally, this is a safe procedure. However, problems may occur, including:  Harm to a pregnant woman and her unborn baby. This test involves the use of radiation. Radiation exposure can be dangerous to a pregnant woman and her unborn baby. If you are pregnant or think you may be pregnant, you should not have this procedure done.  Slight increase in the risk of cancer. This is because of the radiation involved in the test. What happens before the procedure? Ask your health care provider for any specific instructions on how to prepare for this procedure. You may be asked to avoid products that contain caffeine, tobacco, or nicotine for 4 hours before the procedure. What happens during the procedure?  You will undress and remove any jewelry from your neck or chest.  You will put on a hospital gown.  Sticky electrodes will be placed on  your chest. The electrodes will be connected to an electrocardiogram (ECG) machine to record a tracing of the electrical activity of your heart.  You will lie down on a curved bed that is attached to the CT scanner.  You may be given medicine to slow down your heart rate so that clear pictures can be created.  You will be moved into the CT scanner, and the CT scanner will take pictures of your heart. During this time, you will be asked to lie still and hold your breath for 2-3 seconds at a time while each picture of your heart  is being taken. The procedure may vary among health care providers and hospitals.   What happens after the procedure?  You can get dressed.  You can return to your normal activities.  It is up to you to get the results of your procedure. Ask your health care provider, or the department that is doing the procedure, when your results will be ready. Summary  A coronary calcium scan is an imaging test used to look for deposits of plaque in the inner lining of the blood vessels of the heart (coronary arteries). Plaque is made up of calcium, protein, and fatty substances.  Generally, this is a safe procedure. Tell your health care provider if you are pregnant or may be pregnant.  Ask your health care provider for any specific instructions on how to prepare for this procedure.  A CT scanner will take pictures of your heart.  You can return to your normal activities after the scan is done. This information is not intended to replace advice given to you by your health care provider. Make sure you discuss any questions you have with your health care provider. Document Revised: 08/29/2018 Document Reviewed: 08/29/2018 Elsevier Patient Education  2021 [employer].

## 2020-07-26 LAB — CBC WITH DIFFERENTIAL/PLATELET
Absolute Monocytes: 694 cells/uL (ref 200–950)
Basophils Absolute: 123 cells/uL (ref 0–200)
Basophils Relative: 1.1 %
Eosinophils Absolute: 526 cells/uL — ABNORMAL HIGH (ref 15–500)
Eosinophils Relative: 4.7 %
HCT: 42 % (ref 35.0–45.0)
Hemoglobin: 13.7 g/dL (ref 11.7–15.5)
Lymphs Abs: 3674 cells/uL (ref 850–3900)
MCH: 28.1 pg (ref 27.0–33.0)
MCHC: 32.6 g/dL (ref 32.0–36.0)
MCV: 86.1 fL (ref 80.0–100.0)
MPV: 11.3 fL (ref 7.5–12.5)
Monocytes Relative: 6.2 %
Neutro Abs: 6182 cells/uL (ref 1500–7800)
Neutrophils Relative %: 55.2 %
Platelets: 328 10*3/uL (ref 140–400)
RBC: 4.88 10*6/uL (ref 3.80–5.10)
RDW: 13 % (ref 11.0–15.0)
Total Lymphocyte: 32.8 %
WBC: 11.2 10*3/uL — ABNORMAL HIGH (ref 3.8–10.8)

## 2020-07-26 LAB — LIPID PANEL
Cholesterol: 164 mg/dL (ref <200)
HDL: 52 mg/dL (ref >=50)
LDL Cholesterol (Calc): 89 mg/dL (calc)
Non-HDL Cholesterol (Calc): 112 mg/dL (calc) (ref <130)
Total CHOL/HDL Ratio: 3.2 (calc) (ref <5.0)
Triglycerides: 129 mg/dL (ref <150)

## 2020-07-26 LAB — HEMOGLOBIN A1C
Hgb A1c MFr Bld: 5.7 % of total Hgb — ABNORMAL HIGH (ref <5.7)
Mean Plasma Glucose: 117 mg/dL
eAG (mmol/L): 6.5 mmol/L

## 2020-07-26 LAB — COMPLETE METABOLIC PANEL WITH GFR
AG Ratio: 1.7 (calc) (ref 1.0–2.5)
ALT: 23 U/L (ref 6–29)
AST: 18 U/L (ref 10–35)
Albumin: 4.3 g/dL (ref 3.6–5.1)
Alkaline phosphatase (APISO): 97 U/L (ref 37–153)
BUN: 11 mg/dL (ref 7–25)
CO2: 28 mmol/L (ref 20–32)
Calcium: 10.9 mg/dL — ABNORMAL HIGH (ref 8.6–10.4)
Chloride: 102 mmol/L (ref 98–110)
Creat: 0.74 mg/dL (ref 0.50–0.99)
GFR, Est African American: 102 mL/min/{1.73_m2} (ref >=60)
GFR, Est Non African American: 88 mL/min/{1.73_m2} (ref >=60)
Globulin: 2.5 g/dL (calc) (ref 1.9–3.7)
Glucose, Bld: 92 mg/dL (ref 65–99)
Potassium: 5.3 mmol/L (ref 3.5–5.3)
Sodium: 140 mmol/L (ref 135–146)
Total Bilirubin: 0.3 mg/dL (ref 0.2–1.2)
Total Protein: 6.8 g/dL (ref 6.1–8.1)

## 2020-07-26 LAB — MICROALBUMIN / CREATININE URINE RATIO
Creatinine, Urine: 126 mg/dL (ref 20–275)
Microalb Creat Ratio: 16 mcg/mg creat (ref <30)
Microalb, Ur: 2 mg/dL

## 2020-07-26 LAB — URINALYSIS, ROUTINE W REFLEX MICROSCOPIC
Bacteria, UA: NONE SEEN /HPF
Bilirubin Urine: NEGATIVE
Glucose, UA: NEGATIVE
Hgb urine dipstick: NEGATIVE
Hyaline Cast: NONE SEEN /LPF
Ketones, ur: NEGATIVE
Nitrite: NEGATIVE
Protein, ur: NEGATIVE
RBC / HPF: NONE SEEN /HPF (ref 0–2)
Specific Gravity, Urine: 1.02 (ref 1.001–1.035)
pH: 5.5 (ref 5.0–8.0)

## 2020-07-26 LAB — TSH: TSH: 0.51 mIU/L (ref 0.40–4.50)

## 2020-07-26 LAB — MICROSCOPIC MESSAGE

## 2020-07-26 LAB — VITAMIN D 25 HYDROXY (VIT D DEFICIENCY, FRACTURES): Vit D, 25-Hydroxy: 65 ng/mL (ref 30–100)

## 2020-07-26 LAB — MAGNESIUM: Magnesium: 1.7 mg/dL (ref 1.5–2.5)

## 2020-07-29 ENCOUNTER — Other Ambulatory Visit (HOSPITAL_COMMUNITY): Payer: Self-pay

## 2020-07-29 ENCOUNTER — Other Ambulatory Visit: Payer: Self-pay | Admitting: Adult Health

## 2020-07-29 ENCOUNTER — Other Ambulatory Visit: Payer: Self-pay | Admitting: Internal Medicine

## 2020-07-29 MED ORDER — ATORVASTATIN CALCIUM 10 MG PO TABS
10.0000 mg | ORAL_TABLET | Freq: Every day | ORAL | 3 refills | Status: DC
  Filled 2020-07-29: qty 90, 90d supply, fill #0
  Filled 2020-11-12: qty 90, 90d supply, fill #1
  Filled 2021-02-09 – 2021-02-12 (×2): qty 90, 90d supply, fill #2
  Filled 2021-05-05: qty 90, 90d supply, fill #3

## 2020-07-29 MED FILL — Propranolol HCl Tab 80 MG: ORAL | 90 days supply | Qty: 180 | Fill #0 | Status: AC

## 2020-07-30 ENCOUNTER — Other Ambulatory Visit (HOSPITAL_COMMUNITY): Payer: Self-pay

## 2020-08-11 ENCOUNTER — Other Ambulatory Visit (HOSPITAL_COMMUNITY): Payer: Self-pay

## 2020-08-11 MED FILL — Levothyroxine Sodium Tab 137 MCG: ORAL | 90 days supply | Qty: 90 | Fill #0 | Status: CN

## 2020-08-13 ENCOUNTER — Other Ambulatory Visit (HOSPITAL_COMMUNITY): Payer: Self-pay

## 2020-08-13 MED FILL — Levothyroxine Sodium Tab 137 MCG: ORAL | 90 days supply | Qty: 90 | Fill #0 | Status: AC

## 2020-09-03 ENCOUNTER — Other Ambulatory Visit (HOSPITAL_COMMUNITY): Payer: Self-pay

## 2020-09-03 ENCOUNTER — Other Ambulatory Visit: Payer: Self-pay | Admitting: Nurse Practitioner

## 2020-09-03 ENCOUNTER — Other Ambulatory Visit: Payer: Self-pay | Admitting: Internal Medicine

## 2020-09-03 ENCOUNTER — Other Ambulatory Visit: Payer: Self-pay | Admitting: Adult Health

## 2020-09-03 DIAGNOSIS — I10 Essential (primary) hypertension: Secondary | ICD-10-CM

## 2020-09-03 DIAGNOSIS — F3341 Major depressive disorder, recurrent, in partial remission: Secondary | ICD-10-CM

## 2020-09-03 MED ORDER — AMILORIDE HCL 5 MG PO TABS
5.0000 mg | ORAL_TABLET | ORAL | 1 refills | Status: DC
  Filled 2020-09-03: qty 90, fill #0

## 2020-09-03 MED ORDER — BUPROPION HCL ER (XL) 150 MG PO TB24
150.0000 mg | ORAL_TABLET | Freq: Every morning | ORAL | 3 refills | Status: DC
  Filled 2020-09-03: qty 90, 90d supply, fill #0
  Filled 2020-12-03: qty 90, 90d supply, fill #1
  Filled 2021-03-11: qty 90, 90d supply, fill #2
  Filled 2021-06-10: qty 90, 90d supply, fill #3

## 2020-09-03 MED ORDER — AMILORIDE HCL 5 MG PO TABS
5.0000 mg | ORAL_TABLET | Freq: Every day | ORAL | 0 refills | Status: DC
Start: 2020-09-03 — End: 2020-12-03
  Filled 2020-09-03: qty 90, 90d supply, fill #0

## 2020-09-15 ENCOUNTER — Encounter: Payer: Self-pay | Admitting: Internal Medicine

## 2020-09-23 ENCOUNTER — Other Ambulatory Visit (HOSPITAL_COMMUNITY): Payer: Self-pay

## 2020-09-23 MED FILL — Metformin HCl Tab ER 24HR 500 MG: ORAL | 90 days supply | Qty: 360 | Fill #1 | Status: AC

## 2020-10-29 ENCOUNTER — Other Ambulatory Visit (HOSPITAL_COMMUNITY): Payer: Self-pay

## 2020-10-29 ENCOUNTER — Other Ambulatory Visit: Payer: Self-pay | Admitting: Adult Health

## 2020-10-29 MED ORDER — PROPRANOLOL HCL 80 MG PO TABS
80.0000 mg | ORAL_TABLET | Freq: Two times a day (BID) | ORAL | 1 refills | Status: DC
  Filled 2020-10-29: qty 180, 90d supply, fill #0
  Filled 2021-02-09: qty 180, 90d supply, fill #1

## 2020-11-04 ENCOUNTER — Other Ambulatory Visit (HOSPITAL_COMMUNITY): Payer: Self-pay

## 2020-11-04 ENCOUNTER — Other Ambulatory Visit: Payer: Self-pay | Admitting: Adult Health Nurse Practitioner

## 2020-11-04 MED ORDER — ESOMEPRAZOLE MAGNESIUM 40 MG PO CPDR
40.0000 mg | DELAYED_RELEASE_CAPSULE | Freq: Every day | ORAL | 1 refills | Status: DC
  Filled 2020-11-04: qty 90, 90d supply, fill #0
  Filled 2021-01-27: qty 90, 90d supply, fill #1

## 2020-11-12 ENCOUNTER — Other Ambulatory Visit (HOSPITAL_COMMUNITY): Payer: Self-pay

## 2020-11-12 ENCOUNTER — Other Ambulatory Visit: Payer: Self-pay | Admitting: Adult Health

## 2020-11-12 MED ORDER — CANDESARTAN CILEXETIL 32 MG PO TABS
32.0000 mg | ORAL_TABLET | Freq: Every day | ORAL | 1 refills | Status: DC
  Filled 2020-11-12: qty 90, 90d supply, fill #0
  Filled 2021-02-12: qty 90, 90d supply, fill #1

## 2020-11-12 MED ORDER — SYNTHROID 137 MCG PO TABS
137.0000 ug | ORAL_TABLET | Freq: Every day | ORAL | 1 refills | Status: DC
  Filled 2020-11-12: qty 90, 90d supply, fill #0
  Filled 2021-02-12: qty 90, 90d supply, fill #1

## 2020-11-14 ENCOUNTER — Other Ambulatory Visit (HOSPITAL_COMMUNITY): Payer: Self-pay

## 2020-12-03 ENCOUNTER — Other Ambulatory Visit: Payer: Self-pay | Admitting: Nurse Practitioner

## 2020-12-03 ENCOUNTER — Other Ambulatory Visit (HOSPITAL_COMMUNITY): Payer: Self-pay

## 2020-12-03 DIAGNOSIS — I10 Essential (primary) hypertension: Secondary | ICD-10-CM

## 2020-12-03 MED ORDER — AMILORIDE HCL 5 MG PO TABS
5.0000 mg | ORAL_TABLET | Freq: Every day | ORAL | 0 refills | Status: DC
Start: 2020-12-03 — End: 2021-03-11
  Filled 2020-12-03: qty 90, 90d supply, fill #0

## 2020-12-04 ENCOUNTER — Other Ambulatory Visit (HOSPITAL_COMMUNITY): Payer: Self-pay

## 2020-12-15 DIAGNOSIS — H25013 Cortical age-related cataract, bilateral: Secondary | ICD-10-CM | POA: Diagnosis not present

## 2020-12-23 ENCOUNTER — Ambulatory Visit: Payer: Self-pay

## 2020-12-23 ENCOUNTER — Encounter: Payer: Self-pay | Admitting: Orthopaedic Surgery

## 2020-12-23 ENCOUNTER — Ambulatory Visit: Payer: 59 | Admitting: Orthopaedic Surgery

## 2020-12-23 DIAGNOSIS — M79642 Pain in left hand: Secondary | ICD-10-CM

## 2020-12-23 DIAGNOSIS — M25532 Pain in left wrist: Secondary | ICD-10-CM | POA: Diagnosis not present

## 2020-12-23 DIAGNOSIS — M1812 Unilateral primary osteoarthritis of first carpometacarpal joint, left hand: Secondary | ICD-10-CM | POA: Diagnosis not present

## 2020-12-23 MED ORDER — LIDOCAINE HCL 1 % IJ SOLN
1.0000 mL | INTRAMUSCULAR | Status: AC | PRN
Start: 2020-12-23 — End: 2020-12-23
  Administered 2020-12-23: 1 mL

## 2020-12-23 MED ORDER — METHYLPREDNISOLONE ACETATE 40 MG/ML IJ SUSP
40.0000 mg | INTRAMUSCULAR | Status: AC | PRN
  Administered 2020-12-23: 40 mg

## 2020-12-23 NOTE — Progress Notes (Signed)
Office Visit Note   Patient: Norma Sanchez           Date of Birth: [birth year]           MRN: [id number] Visit Date: 12/23/2020              Requested by: Norma Cowboy, MD [address] [place],  [place] [address] PCP: Norma Cowboy, MD   Assessment & Plan: Visit Diagnoses:  1. Pain in left hand   2. Pain in left wrist   3. Arthritis of carpometacarpal [facility]) joint of left thumb     Plan:   Exam and x-rays are significant for [facility] joint arthritis of the base of the left thumb.  I went over her x-rays with her and described treatment options.  We decided that a steroid injection would be worth trying.  If this helps that will be great but if not we would refer her to a hand specialist within the group.  She understands this fully.  All question concerns were answered and addressed.  I placed a steroid injection of the base of the left which she tolerated well.  Follow-up is as needed.  I have also recommended she try Voltaren gel several times a day around the base of the left thumb.  Follow-Up Instructions: Return if symptoms worsen or fail to improve.   Orders:  Orders Placed This Encounter  Procedures   Hand/UE Inj   XR Hand Complete Left   No orders of the defined types were placed in this encounter.     Procedures: Hand/UE Inj: L thumb [facility] for osteoarthritis on 12/23/2020 8:23 AM Medications: 1 mL lidocaine 1 %; 40 mg methylPREDNISolone acetate 40 MG/ML     Clinical Data: No additional findings.   Subjective: Chief Complaint  Patient presents with   Left Thumb - Pain  Norma Sanchez comes in today with pain around her left hand and wrist somewhat focused around the thumb.  She said someone at work noticed that she was swollen in that area and she wanted to get this checked out.  She does have remote history of carpal tunnel releases.  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  She is actually an old patient of mine as well.  There has been no recent  injury to this area of her left hand or wrist.  HPI  Review of Systems There is no listed headache, chest pain, shortness of breath, fever, chills, nausea, vomiting  Objective: Vital Signs: There were no vitals taken for this visit.  Physical Exam She is alert and orient x3 and in no acute distress Ortho Exam Examination of both hands shows no muscle atrophy either hand.  There is prominence of the soft tissue and bone around the dorsal aspect of the left thumb but this is equal to the opposite right side.  Both hands are well-perfused.  There is a positive grind test of the base of the thumb at the [facility] joint of left side.  Her Lourena Simmonds test is negative.  There is no triggering or pain over the A1 pulley of the thumb. Specialty Comments:  No specialty comments available.  Imaging: XR Hand Complete Left  Result Date: 12/23/2020 3 views of the left hand that include the wrist show significant basilar thumb joint arthritis at the [facility] joint.  There is complete loss of joint space and large osteophytes around the joint.    PMFS History: Patient Active Problem List   Diagnosis  Date Noted   Arthritis of carpometacarpal [facility]) joint of left thumb 12/23/2020   Vitamin D deficiency 03/20/2014   Medication management 03/20/2014   Abnormal glucose 12/21/2013   Hot flashes, menopausal 12/21/2013   Chronic cough 02/01/2013   Hypertension    GERD (gastroesophageal reflux disease)    Hypothyroidism    Hyperlipidemia    Bilateral leg and foot pain 07/23/2011   Morbid obesity (HCC) 07/23/2011   Past Medical History:  Diagnosis Date   Biliary dyskinesia 12/07/2011   GERD (gastroesophageal reflux disease)    High cholesterol    Hypertension    Hypothyroidism    Multinodular goiter     Family History  Problem Relation Age of Onset   Stroke Father    Heart attack Father    AAA (abdominal aortic aneurysm) Father        rupture, smoker   Stroke Mother    Breast cancer Mother 26    Alcoholism Sister    Heart attack Brother 8   CAD Brother    Suicidality Maternal Grandmother    Suicidality Maternal Grandfather    Heart attack Paternal Grandmother        36s    Past Surgical History:  Procedure Laterality Date   ABDOMINAL HYSTERECTOMY  8 yrs ago   1 ovary spared   CARPAL TUNNEL RELEASE  yrs ago   both wrists   CHOLECYSTECTOMY  12/28/2011   Procedure: LAPAROSCOPIC CHOLECYSTECTOMY WITH INTRAOPERATIVE CHOLANGIOGRAM;  Surgeon: Norma Pollack, MD;  Location: [facility];  Service: General;  Laterality: N/A;  Laparoscopic cholecystectomy with attempted cholangiogram   KNEE SURGERY  arthroscopic, 3 yrs ago   right knee twice   radioactive iodine to thyroid'  march 2013   TONSILLECTOMY  age 76   TUBAL LIGATION  yrs ago   Social History   Occupational History   Not on file  Tobacco Use   Smoking status: Never   Smokeless tobacco: Never  Vaping Use   Vaping Use: Never used  Substance and Sexual Activity   Alcohol use: No   Drug use: No   Sexual activity: Yes    Partners: Male    Birth control/protection: Surgical

## 2020-12-30 DIAGNOSIS — Z1231 Encounter for screening mammogram for malignant neoplasm of breast: Secondary | ICD-10-CM | POA: Diagnosis not present

## 2020-12-30 LAB — HM MAMMOGRAPHY

## 2020-12-31 ENCOUNTER — Other Ambulatory Visit (HOSPITAL_COMMUNITY): Payer: Self-pay

## 2020-12-31 MED FILL — Metformin HCl Tab ER 24HR 500 MG: ORAL | 90 days supply | Qty: 360 | Fill #2 | Status: AC

## 2021-01-27 ENCOUNTER — Other Ambulatory Visit (HOSPITAL_COMMUNITY): Payer: Self-pay

## 2021-01-28 ENCOUNTER — Encounter: Payer: Self-pay | Admitting: Internal Medicine

## 2021-01-28 NOTE — Progress Notes (Signed)
6 MONTH FOLLOW UP  Assessment and Plan:  Essential hypertension Continue medication Monitor blood pressure at home; call if consistently over 130/80 Continue DASH diet.   Reminder to go to the ER if any CP, SOB, nausea, dizziness, severe HA, changes vision/speech, left arm numbness and tingling and jaw pain.  Hypothyroidism, unspecified hypothyroidism type check TSH level, continue medications the same, reminded to take on an empty stomach 30-63mins before food.   Morbid obesity, unspecified obesity type ([place]) - BMI 46 Obesity with co morbidities- long discussion about weight loss, diet, and exercise - follow up 6 months for progress monitoring - increase veggies, decrease carbs - long discussion about weight loss, diet, and exercise - discussed working on stress/workload, sleep quality, small changes  - resent phentermine, but also discussed possible benefit with wegovy once available in the new year; information given, questions answered. She will check with insurance on 2023 formulary  High cholesterol -continue medications, check lipids, decrease fatty foods, increase activity.  - Lipid panel   Medication management - Magnesium   Hot flashes, menopausal  Continue lexapro Off of estrogen and doing well  Weight loss encouraged   Vitamin D deficiency Continue supplement   Gastroesophageal reflux disease with esophagitis Continue PPI/H2 blocker, diet discussed   Orders Placed This Encounter  Procedures   CBC with Differential/Platelet   COMPLETE METABOLIC PANEL WITH GFR   Magnesium   Lipid panel   TSH   Hemoglobin A1c      Discussed med's effects and SE's. Labs and tests as requested with regular follow-up as recommended. Future Appointments  Date Time Provider [place]  07/31/2021 10:00 AM Liane Comber, NP [facility] None    HPI  60 y.o. female  presents for a 6 month follow up. She has Bilateral leg and foot pain; Morbid obesity with BMI of  45.0-49.9, adult ([place]); Hypertension; GERD (gastroesophageal reflux disease); Hypothyroidism; Hyperlipidemia; Chronic cough; Abnormal glucose; Hot flashes, menopausal; Vitamin D deficiency; Medication management; and Arthritis of carpometacarpal [facility]) joint of left thumb on their problem list.  She has hot flashes, is on lexapro 20 mg daily and well controlled. Also on wellbutrin for mood. Was doing well until recently but lots of stress in family due to mom with dementia, niece with mental health issues, was taking money from patient's mother, not treating her children well.   She does have bil knee pain, follows with Dr. Ninfa Linden.   BMI is Body mass index is 47.26 kg/m., she has not been working on diet and exercise. She has been working 13-14 hour days, works 3rd shift, she admits to not drinking enough water and eating too much sugar. She is stressed with her mother with possible dementia, no help from her family. Has not been taking the phentermine, out and requesting refill.   Wt Readings from Last 3 Encounters:  01/29/21 244 lb (110.7 kg)  07/25/20 241 lb (109.3 kg)  11/29/19 243 lb (110.2 kg)    Her blood pressure has been controlled at home, today their BP is BP: 138/82 She does not workout due to time restraints.  She denies chest pain, shortness of breath, dizziness.   She is on cholesterol medication, lipitor 10 daily and denies myalgias. Her cholesterol is at goal. The cholesterol last visit was:   Lab Results  Component Value Date   CHOL 164 07/25/2020   HDL 52 07/25/2020   LDLCALC 89 07/25/2020   TRIG 129 07/25/2020   CHOLHDL 3.2 07/25/2020    She has  been working on diet and exercise for prediabetes, she is on metformin 4 pills a day, and denies paresthesia of the feet, polydipsia, polyuria and visual disturbances. Last A1C in the office was:  Lab Results  Component Value Date   HGBA1C 5.7 (H) 07/25/2020   Patient is on Vitamin D supplement, on 1000 IU daily Lab Results   Component Value Date   VD25OH 26 07/25/2020    She is on thyroid medication. Her medication was not changed last visit.  137 mcg daily.  Lab Results  Component Value Date   TSH 0.51 07/25/2020      Current Medications:   Current Outpatient Medications (Endocrine & Metabolic):    metFORMIN (GLUCOPHAGE-XR) 500 MG 24 hr tablet, TAKE 2 TABLETS BY MOUTH TWICE A DAY.   SYNTHROID 137 MCG tablet, TAKE 1 TAB BY MOUTH DAILY ON AN EMPTY STOMACH WITH ONLY WATER FOR 30 MINUTES & NO ANTACID MEDS, CALCIUM, MAGNESIUM FOR 4 HOURS & AVOID BIOTIN  Current Outpatient Medications (Cardiovascular):    aMILoride (MIDAMOR) 5 MG tablet, Take 1 tablet (5 mg total) by mouth daily.   atorvastatin (LIPITOR) 10 MG tablet, Take 1 tablet (10 mg total) by mouth daily for cholesterol   candesartan (ATACAND) 32 MG tablet, Take 1 tablet (32 mg total) by mouth nightly for blood pressure   EPINEPHRINE 0.3 mg/0.3 mL IJ SOAJ injection, USE AS DIRECTED   propranolol (INDERAL) 80 MG tablet, TAKE 1 TABLET BY MOUTH TWICE A DAY IN THE MORNING AND IN THE EVENING FOR BLOOD PRESSURE  Current Outpatient Medications (Respiratory):    cetirizine (ZYRTEC) 10 MG tablet, Take 10 mg by mouth daily.   fluticasone (FLONASE) 50 MCG/ACT nasal spray, Place 1 spray into both nostrils daily.  Current Outpatient Medications (Analgesics):    aspirin EC 81 MG tablet, Take 81 mg by mouth daily. Swallow whole.   traMADol (ULTRAM) 50 MG tablet, Take 1 tablet (50 mg total) by mouth every 12 (twelve) hours as needed (cough).  Current Outpatient Medications (Hematological):    cyanocobalamin 2000 MCG tablet, Take 2,000 mcg by mouth daily.  Current Outpatient Medications (Other):    Ascorbic Acid (VITAMIN C) 1000 MG tablet, Take 1,000 mg by mouth daily.   buPROPion (WELLBUTRIN XL) 150 MG 24 hr tablet, Take 1 tablet (150 mg total) by mouth every morning.   Cholecalciferol (VITAMIN D-3) 5000 UNITS TABS, Take 5,000 Units by mouth daily.    escitalopram (LEXAPRO) 20 MG tablet, Take 1 tablet (20 mg total) by mouth daily for mood.   esomeprazole (NEXIUM) 40 MG capsule, Take 1 capsule (40 mg total) by mouth daily to prevent heartburn & indigestion   Glucosamine-Chondroitin (GLUCOSAMINE CHONDR COMPLEX PO), Take 1,500 mg by mouth. 1500mg /1200mg   Actual dose   ketoconazole (NIZORAL) 2 % cream, Apply 1 application topically 2 (two) times daily.   metroNIDAZOLE (METROCREAM) 0.75 % cream, Apply topically 2 (two) times daily.   nystatin (MYCOSTATIN/NYSTOP) powder, Apply daily after a shower as needed   oxybutynin (DITROPAN) 5 MG tablet, Take 1 tablet (5 mg total) by mouth 2 (two) times daily as needed (sweating).   triamcinolone cream (KENALOG) 0.5 %, Apply 1 application topically 2 (two) times daily.   phentermine (ADIPEX-P) 37.5 MG tablet, Take 1 tablet (37.5 mg total) by mouth daily before breakfast.   Medical History:  Past Medical History:  Diagnosis Date   Biliary dyskinesia 12/07/2011   GERD (gastroesophageal reflux disease)    High cholesterol    Hypertension    Hypothyroidism  Multinodular goiter    Allergies Allergies  Allergen Reactions   Avelox [Moxifloxacin Hcl In Nacl] Hives    Thrush, throat might have closed    Hydrochlorothiazide Itching    On hands and feet   Other Itching    All "Cillins", hives alao   Penicillins     Hives   Prednisone     Chest pains   Zostavax [Zoster Vaccine Live]    Codeine Hives, Itching and Rash   Levothyroxine Rash    Only to generic. Not to brand synthroid.    Sulfa Antibiotics Itching and Rash    Hands and feet itch and turn red    SURGICAL HISTORY She  has a past surgical history that includes Carpal tunnel release (yrs ago); Knee surgery (arthroscopic, 3 yrs ago); Tonsillectomy (age 84); Abdominal hysterectomy (8 yrs ago); Tubal ligation (yrs ago); radioactive iodine to thyroid' (march 2013); and Cholecystectomy (12/28/2011). FAMILY HISTORY Her family history includes  AAA (abdominal aortic aneurysm) in her father; Alcoholism in her sister; Breast cancer (age of onset: 56) in her mother; CAD in her brother; Heart attack in her father and paternal grandmother; Heart attack (age of onset: 61) in her brother; Stroke in her father and mother; Suicidality in her maternal grandfather and maternal grandmother. SOCIAL HISTORY She  reports that she has never smoked. She has never used smokeless tobacco. She reports that she does not drink alcohol and does not use drugs.  Review of Systems  Constitutional: Negative.  Negative for chills, fever, malaise/fatigue and weight loss.  HENT: Negative.  Negative for hearing loss and tinnitus.   Eyes: Negative.  Negative for blurred vision and double vision.  Respiratory: Negative.  Negative for cough, shortness of breath and wheezing.   Cardiovascular: Negative.  Negative for chest pain, palpitations, orthopnea, claudication and leg swelling.  Gastrointestinal: Negative.  Negative for abdominal pain, blood in stool, constipation, diarrhea, heartburn, melena, nausea and vomiting.  Genitourinary: Negative.  Negative for dysuria, flank pain, frequency, hematuria and urgency.  Musculoskeletal:  Positive for joint pain (bil knees). Negative for back pain, falls, myalgias and neck pain.  Skin:  Negative for rash.  Neurological: Negative.  Negative for dizziness, tingling, sensory change, weakness and headaches.  Endo/Heme/Allergies: Negative.  Negative for polydipsia.  Psychiatric/Behavioral: Negative.  Negative for depression. The patient does not have insomnia.   All other systems reviewed and are negative.   Physical Exam: Estimated body mass index is 47.26 kg/m as calculated from the following:   Height as of 07/25/20: 5' 0.25" (1.53 m).   Weight as of this encounter: 244 lb (110.7 kg). BP 138/82   Pulse (!) 58   Temp (!) 97.2 F (36.2 C)   Wt 244 lb (110.7 kg)   SpO2 99%   BMI 47.26 kg/m  General Appearance: Well  nourished, in no apparent distress.  Eyes: PERRLA, EOMs, conjunctiva no swelling or erythema Sinuses: No Frontal/maxillary tenderness  ENT/Mouth: Ext aud canals clear, normal light reflex with TMs without erythema, bulging. Good dentition. No erythema, swelling, or exudate on post pharynx. Tonsils not swollen or erythematous. Hearing normal.  Neck: Supple, thyroid normal. No bruits  Respiratory: Respiratory effort normal, BS equal bilaterally without rales, rhonchi, wheezing or stridor.  Cardio: RRR without murmurs, rubs or gallops. Brisk peripheral pulses without edema.  Abdomen: Soft, nontender, morbidly obese limiting exam, no guarding, rebound, hernias, masses, or organomegaly. .  Lymphatics: Non tender without lymphadenopathy.  Musculoskeletal: Limited exam by body habitus, symmetrical ROM all peripheral  extremities,5/5 strength, and normal gait.  Skin: Warm, dry without rashes, lesions, ecchymosis. Neuro: Cranial nerves intact, reflexes equal bilaterally. Normal muscle tone, no cerebellar symptoms. Sensation intact.  Psych: Awake and oriented X 3, normal affect, Insight and Judgment appropriate.    Gorden Harms Lesleyann Fichter 1:09 PM [facility]

## 2021-01-29 ENCOUNTER — Ambulatory Visit: Payer: 59 | Admitting: Adult Health

## 2021-01-29 ENCOUNTER — Other Ambulatory Visit: Payer: Self-pay

## 2021-01-29 ENCOUNTER — Encounter: Payer: Self-pay | Admitting: Adult Health

## 2021-01-29 ENCOUNTER — Other Ambulatory Visit (HOSPITAL_COMMUNITY): Payer: Self-pay

## 2021-01-29 VITALS — BP 138/82 | HR 58 | Temp 97.2°F | Wt 244.0 lb

## 2021-01-29 DIAGNOSIS — Z79899 Other long term (current) drug therapy: Secondary | ICD-10-CM | POA: Diagnosis not present

## 2021-01-29 DIAGNOSIS — E785 Hyperlipidemia, unspecified: Secondary | ICD-10-CM | POA: Diagnosis not present

## 2021-01-29 DIAGNOSIS — Z6841 Body Mass Index (BMI) 40.0 and over, adult: Secondary | ICD-10-CM | POA: Diagnosis not present

## 2021-01-29 DIAGNOSIS — E039 Hypothyroidism, unspecified: Secondary | ICD-10-CM | POA: Diagnosis not present

## 2021-01-29 DIAGNOSIS — R7309 Other abnormal glucose: Secondary | ICD-10-CM | POA: Diagnosis not present

## 2021-01-29 DIAGNOSIS — E559 Vitamin D deficiency, unspecified: Secondary | ICD-10-CM | POA: Diagnosis not present

## 2021-01-29 DIAGNOSIS — I10 Essential (primary) hypertension: Secondary | ICD-10-CM

## 2021-01-29 MED ORDER — PHENTERMINE HCL 37.5 MG PO TABS
37.5000 mg | ORAL_TABLET | Freq: Every day | ORAL | 2 refills | Status: DC
  Filled 2021-01-29 – 2021-02-13 (×2): qty 30, 30d supply, fill #0
  Filled 2021-04-20: qty 30, 30d supply, fill #1
  Filled 2021-05-05 – 2021-06-10 (×2): qty 30, 30d supply, fill #2

## 2021-01-29 NOTE — Patient Instructions (Signed)
Ask insurance about weight loss meds - Wegovy    Semaglutide Injection (Weight Management) What is this medication? SEMAGLUTIDE (SEM a GLOO tide) promotes weight loss. It may also be used to maintain weight loss. It works by decreasing appetite. Changes to diet and exercise are often combined with this medication. This medicine may be used for other purposes; ask your health care provider or pharmacist if you have questions. COMMON BRAND NAME(S): [id number] What should I tell my care team before I take this medication? They need to know if you have any of these conditions: Endocrine tumors (MEN 2) or if someone in your family had these tumors Eye disease, vision problems Gallbladder disease History of depression or mental health disease History of pancreatitis Kidney disease Stomach or intestine problems Suicidal thoughts, plans, or attempt; a previous suicide attempt by you or a family member Thyroid cancer or if someone in your family had thyroid cancer An unusual or allergic reaction to semaglutide, other medications, foods, dyes, or preservatives Pregnant or trying to get pregnant Breast-feeding How should I use this medication? This medication is injected under the skin. You will be taught how to prepare and give it. Take it as directed on the prescription label. It is given once every week (every 7 days). Keep taking it unless your care team tells you to stop. It is important that you put your used needles and pens in a special sharps container. Do not put them in a trash can. If you do not have a sharps container, call your pharmacist or care team to get one. A special MedGuide will be given to you by the pharmacist with each prescription and refill. Be sure to read this information carefully each time. This medication comes with INSTRUCTIONS FOR USE. Ask your pharmacist for directions on how to use this medication. Read the information carefully. Talk to your pharmacist or care team if  you have questions. Talk to your care team about the use of this medication in children. Special care may be needed. Overdosage: If you think you have taken too much of this medicine contact a poison control center or emergency room at once. NOTE: This medicine is only for you. Do not share this medicine with others. What if I miss a dose? If you miss a dose and the next scheduled dose is more than 2 days away, take the missed dose as soon as possible. If you miss a dose and the next scheduled dose is less than 2 days away, do not take the missed dose. Take the next dose at your regular time. Do not take double or extra doses. If you miss your dose for 2 weeks or more, take the next dose at your regular time or call your care team to talk about how to restart this medication. What may interact with this medication? Insulin and other medications for diabetes This list may not describe all possible interactions. Give your health care provider a list of all the medicines, herbs, non-prescription drugs, or dietary supplements you use. Also tell them if you smoke, drink alcohol, or use illegal drugs. Some items may interact with your medicine. What should I watch for while using this medication? Visit your care team for regular checks on your progress. It may be some time before you see the benefit from this medication. Drink plenty of fluids while taking this medication. Check with your care team if you have severe diarrhea, nausea, and vomiting, or if you sweat a lot. The  loss of too much body fluid may make it dangerous for you to take this medication. This medication may affect blood sugar levels. Ask your care team if changes in diet or medications are needed if you have diabetes. If you or your family notice any changes in your behavior, such as new or worsening depression, thoughts of harming yourself, anxiety, other unusual or disturbing thoughts, or memory loss, call your care team right  away. Women should inform their care team if they wish to become pregnant or think they might be pregnant. Losing weight while pregnant is not advised and may cause harm to the unborn child. Talk to your care team for more information. What side effects may I notice from receiving this medication? Side effects that you should report to your care team as soon as possible: Allergic reactions--skin rash, itching, hives, swelling of the face, lips, tongue, or throat Change in vision Dehydration--increased thirst, dry mouth, feeling faint or lightheaded, headache, dark yellow or brown urine Gallbladder problems--severe stomach pain, nausea, vomiting, fever Heart palpitations--rapid, pounding, or irregular heartbeat Kidney injury--decrease in the amount of urine, swelling of the ankles, hands, or feet Pancreatitis--severe stomach pain that spreads to your back or gets worse after eating or when touched, fever, nausea, vomiting Thoughts of suicide or self-harm, worsening mood, feelings of depression Thyroid cancer--new mass or lump in the neck, pain or trouble swallowing, trouble breathing, hoarseness Side effects that usually do not require medical attention (report to your care team if they continue or are bothersome): Diarrhea Loss of appetite Nausea Stomach pain Vomiting This list may not describe all possible side effects. Call your doctor for medical advice about side effects. You may report side effects to FDA at 1-800-FDA-1088. Where should I keep my medication? Keep out of the reach of children and pets. Refrigeration (preferred): Store in the refrigerator. Do not freeze. Keep this medication in the original container until you are ready to take it. Get rid of any unused medication after the expiration date. Room temperature: If needed, prior to cap removal, the pen can be stored at room temperature for up to 28 days. Protect from light. If it is stored at room temperature, get rid of any  unused medication after 28 days or after it expires, whichever is first. It is important to get rid of the medication as soon as you no longer need it or it is expired. You can do this in two ways: Take the medication to a medication take-back program. Check with your pharmacy or law enforcement to find a location. If you cannot return the medication, follow the directions in the MedGuide. NOTE: This sheet is a summary. It may not cover all possible information. If you have questions about this medicine, talk to your doctor, pharmacist, or health care provider.  2022 Elsevier/Gold Standard (2020-05-16 00:00:00)

## 2021-01-30 ENCOUNTER — Other Ambulatory Visit: Payer: Self-pay | Admitting: Adult Health

## 2021-01-30 ENCOUNTER — Encounter: Payer: Self-pay | Admitting: Adult Health

## 2021-01-30 DIAGNOSIS — E210 Primary hyperparathyroidism: Secondary | ICD-10-CM | POA: Insufficient documentation

## 2021-01-30 LAB — COMPLETE METABOLIC PANEL WITH GFR
AG Ratio: 1.5 (calc) (ref 1.0–2.5)
ALT: 18 U/L (ref 6–29)
AST: 16 U/L (ref 10–35)
Albumin: 4.4 g/dL (ref 3.6–5.1)
Alkaline phosphatase (APISO): 90 U/L (ref 37–153)
BUN: 18 mg/dL (ref 7–25)
CO2: 26 mmol/L (ref 20–32)
Calcium: 11 mg/dL — ABNORMAL HIGH (ref 8.6–10.4)
Chloride: 101 mmol/L (ref 98–110)
Creat: 0.81 mg/dL (ref 0.50–1.05)
Globulin: 2.9 g/dL (calc) (ref 1.9–3.7)
Glucose, Bld: 78 mg/dL (ref 65–99)
Potassium: 4.8 mmol/L (ref 3.5–5.3)
Sodium: 138 mmol/L (ref 135–146)
Total Bilirubin: 0.3 mg/dL (ref 0.2–1.2)
Total Protein: 7.3 g/dL (ref 6.1–8.1)
eGFR: 83 mL/min/{1.73_m2} (ref >=60)

## 2021-01-30 LAB — CBC WITH DIFFERENTIAL/PLATELET
Absolute Monocytes: 696 cells/uL (ref 200–950)
Basophils Absolute: 116 cells/uL (ref 0–200)
Basophils Relative: 1 %
Eosinophils Absolute: 487 cells/uL (ref 15–500)
Eosinophils Relative: 4.2 %
HCT: 43.8 % (ref 35.0–45.0)
Hemoglobin: 14.3 g/dL (ref 11.7–15.5)
Lymphs Abs: 3758 cells/uL (ref 850–3900)
MCH: 28.4 pg (ref 27.0–33.0)
MCHC: 32.6 g/dL (ref 32.0–36.0)
MCV: 87.1 fL (ref 80.0–100.0)
MPV: 11.2 fL (ref 7.5–12.5)
Monocytes Relative: 6 %
Neutro Abs: 6542 cells/uL (ref 1500–7800)
Neutrophils Relative %: 56.4 %
Platelets: 356 10*3/uL (ref 140–400)
RBC: 5.03 10*6/uL (ref 3.80–5.10)
RDW: 12.8 % (ref 11.0–15.0)
Total Lymphocyte: 32.4 %
WBC: 11.6 10*3/uL — ABNORMAL HIGH (ref 3.8–10.8)

## 2021-01-30 LAB — MAGNESIUM: Magnesium: 1.7 mg/dL (ref 1.5–2.5)

## 2021-01-30 LAB — LIPID PANEL
Cholesterol: 150 mg/dL (ref <200)
HDL: 61 mg/dL (ref >=50)
LDL Cholesterol (Calc): 72 mg/dL (calc)
Non-HDL Cholesterol (Calc): 89 mg/dL (calc) (ref <130)
Total CHOL/HDL Ratio: 2.5 (calc) (ref <5.0)
Triglycerides: 85 mg/dL (ref <150)

## 2021-01-30 LAB — HEMOGLOBIN A1C
Hgb A1c MFr Bld: 5.8 % of total Hgb — ABNORMAL HIGH (ref <5.7)
Mean Plasma Glucose: 120 mg/dL
eAG (mmol/L): 6.6 mmol/L

## 2021-01-30 LAB — TSH: TSH: 0.86 mIU/L (ref 0.40–4.50)

## 2021-02-02 NOTE — Progress Notes (Signed)
Lvm for pt to call back and get lab visit scheduled per Morrie Sheldon.

## 2021-02-06 ENCOUNTER — Other Ambulatory Visit (HOSPITAL_COMMUNITY): Payer: Self-pay

## 2021-02-09 ENCOUNTER — Other Ambulatory Visit (HOSPITAL_COMMUNITY): Payer: Self-pay

## 2021-02-09 ENCOUNTER — Other Ambulatory Visit: Payer: Self-pay | Admitting: Adult Health

## 2021-02-09 MED ORDER — ESOMEPRAZOLE MAGNESIUM 40 MG PO CPDR
40.0000 mg | DELAYED_RELEASE_CAPSULE | Freq: Every day | ORAL | 1 refills | Status: DC
  Filled 2021-02-09 – 2021-05-05 (×2): qty 90, 90d supply, fill #0
  Filled 2021-08-05: qty 90, 90d supply, fill #1

## 2021-02-12 ENCOUNTER — Other Ambulatory Visit (HOSPITAL_COMMUNITY): Payer: Self-pay

## 2021-02-13 ENCOUNTER — Other Ambulatory Visit (HOSPITAL_COMMUNITY): Payer: Self-pay

## 2021-03-03 ENCOUNTER — Other Ambulatory Visit: Payer: 59

## 2021-03-03 ENCOUNTER — Other Ambulatory Visit: Payer: Self-pay

## 2021-03-04 LAB — PTH, INTACT AND CALCIUM
Calcium: 10.8 mg/dL — ABNORMAL HIGH (ref 8.6–10.4)
PTH: 42 pg/mL (ref 16–77)

## 2021-03-05 ENCOUNTER — Other Ambulatory Visit: Payer: Self-pay | Admitting: Adult Health

## 2021-03-10 ENCOUNTER — Other Ambulatory Visit: Payer: 59

## 2021-03-10 ENCOUNTER — Other Ambulatory Visit: Payer: Self-pay

## 2021-03-11 ENCOUNTER — Other Ambulatory Visit (HOSPITAL_COMMUNITY): Payer: Self-pay

## 2021-03-11 ENCOUNTER — Other Ambulatory Visit: Payer: Self-pay | Admitting: Nurse Practitioner

## 2021-03-11 DIAGNOSIS — I10 Essential (primary) hypertension: Secondary | ICD-10-CM

## 2021-03-11 MED ORDER — AMILORIDE HCL 5 MG PO TABS
5.0000 mg | ORAL_TABLET | Freq: Every day | ORAL | 1 refills | Status: DC
  Filled 2021-03-11: qty 90, 90d supply, fill #0
  Filled 2021-06-10: qty 90, 90d supply, fill #1

## 2021-03-12 ENCOUNTER — Other Ambulatory Visit (HOSPITAL_COMMUNITY): Payer: Self-pay

## 2021-03-17 ENCOUNTER — Encounter: Payer: Self-pay | Admitting: Adult Health

## 2021-03-17 LAB — CALCIUM, URINE, 24 HOUR: Calcium, 24H Urine: 142 mg/24 h

## 2021-03-24 ENCOUNTER — Other Ambulatory Visit: Payer: Self-pay | Admitting: Nurse Practitioner

## 2021-03-24 MED ORDER — SYNTHROID 137 MCG PO TABS
137.0000 ug | ORAL_TABLET | Freq: Every day | ORAL | 1 refills | Status: DC
Start: 2021-03-24 — End: 2021-11-04
  Filled 2021-03-26: qty 30, 30d supply, fill #0
  Filled 2021-05-05: qty 90, 90d supply, fill #0
  Filled 2021-08-05: qty 90, 90d supply, fill #1

## 2021-03-26 ENCOUNTER — Other Ambulatory Visit (HOSPITAL_COMMUNITY): Payer: Self-pay

## 2021-03-26 MED FILL — Metformin HCl Tab ER 24HR 500 MG: ORAL | 90 days supply | Qty: 360 | Fill #3 | Status: CN

## 2021-04-01 ENCOUNTER — Other Ambulatory Visit (HOSPITAL_COMMUNITY): Payer: Self-pay

## 2021-04-01 ENCOUNTER — Other Ambulatory Visit: Payer: Self-pay | Admitting: Adult Health Nurse Practitioner

## 2021-04-01 MED ORDER — METFORMIN HCL ER 500 MG PO TB24
1000.0000 mg | ORAL_TABLET | Freq: Two times a day (BID) | ORAL | 95 refills | Status: DC
  Filled 2021-04-01: qty 360, 90d supply, fill #0
  Filled 2021-07-01: qty 360, 90d supply, fill #1
  Filled 2021-09-16: qty 360, 90d supply, fill #2
  Filled 2022-01-11: qty 360, 90d supply, fill #3

## 2021-04-02 DIAGNOSIS — H25813 Combined forms of age-related cataract, bilateral: Secondary | ICD-10-CM | POA: Diagnosis not present

## 2021-04-02 DIAGNOSIS — H01002 Unspecified blepharitis right lower eyelid: Secondary | ICD-10-CM | POA: Diagnosis not present

## 2021-04-02 DIAGNOSIS — H01004 Unspecified blepharitis left upper eyelid: Secondary | ICD-10-CM | POA: Diagnosis not present

## 2021-04-02 DIAGNOSIS — H01005 Unspecified blepharitis left lower eyelid: Secondary | ICD-10-CM | POA: Diagnosis not present

## 2021-04-02 DIAGNOSIS — H01001 Unspecified blepharitis right upper eyelid: Secondary | ICD-10-CM | POA: Diagnosis not present

## 2021-04-20 ENCOUNTER — Other Ambulatory Visit (HOSPITAL_COMMUNITY): Payer: Self-pay

## 2021-04-24 ENCOUNTER — Encounter (HOSPITAL_COMMUNITY): Payer: Self-pay

## 2021-04-24 ENCOUNTER — Encounter (HOSPITAL_COMMUNITY)
Admission: RE | Admit: 2021-04-24 | Discharge: 2021-04-24 | Disposition: A | Discharge status: Home / Self Care | Payer: 59 | Source: Ambulatory Visit | Attending: Ophthalmology | Admitting: Ophthalmology

## 2021-04-24 ENCOUNTER — Other Ambulatory Visit: Payer: Self-pay

## 2021-04-29 NOTE — H&P (Signed)
Surgical History & Physical ? ?Patient Name: Calyssa Zobrist DOB: [birth year] ? ?Surgery: Cataract extraction with intraocular lens implant phacoemulsification; Right Eye ? ?Surgeon: [name] MD ?Surgery Date:  05-04-21 ?Pre-Op Date:  04-27-21 ? ?HPI: ?A 89 Yr. old female patient is here for a cataract evaluation of both eyes, referred from Dr. Charise Killian. Since the last visit, the affected area is worsening. The patient's vision is blurry. The complaint is associated with light sensitivity. The patient has difficulty preforming daily tasks such as driving at night due to the glare of headlights, reading small print on medicine bottles/books, and recognize faces at a distance. This is negatively affecting the patient's quality of life and the patient is unable to function adequately in life with the current level of vision. HPI Completed by Dr. [name] ? ?Medical History: ?Macula Degeneration ?Glaucoma ?Diabetes ?High Blood Pressure ?LDL ? ?Review of Systems ?Negative Allergic/Immunologic ?Negative Cardiovascular ?Negative Constitutional ?Negative Ear, Nose, Mouth & Throat ?Negative Endocrine ?Negative Eyes ?Negative Gastrointestinal ?Negative Genitourinary ?Negative Hemotologic/Lymphatic ?Negative Integumentary ?Negative Musculoskeletal ?Negative Neurological ?Negative Psychiatry ?Negative Respiratory ? ?Social ?  Never smoked  ? ?Medication ?Metformin, Synthroid, Lipitor, Atacand, Propranolol, Amiloride-HCTZ,  ? ?Sx/Procedures ?Tonsilectomy, Histarectomy, Knee Surgery x2, Hand Surgery, Thumb procedure,  ? ?Drug Allergies  ?Sulfa, Codeine, Cillin family, Hydrochlorothiazide,  ? ?History & Physical: ?Heent: Cataract, Right Eye ?NECK: supple without bruits ?LUNGS: lungs clear to auscultation ?CV: regular rate and rhythm ?Abdomen: soft and non-tender ? ?Impression & Plan: ?Assessment: ?1.  COMBINED FORMS AGE RELATED CATARACT; Both Eyes (H25.813) ?2.  BLEPHARITIS; Right Upper Lid, Right Lower Lid, Left Upper Lid,  Left Lower Lid (H01.001, H01.002,H01.004,H01.005) ? ?Plan: 1.  Cataract accounts for the patient's decreased vision. This visual impairment is not correctable with a tolerable change in glasses or contact lenses. Cataract surgery with an implantation of a new lens should significantly improve the visual and functional status of the patient. Discussed all risks, benefits, alternatives, and potential complications. Discussed the procedures and recovery. Patient desires to have surgery. A-scan ordered and performed today for intra-ocular lens calculations. The surgery will be performed in order to improve vision for driving, reading, and for eye examinations. Recommend phacoemulsification with intra-ocular lens. Recommend Dextenza for post-operative pain and inflammation. ?Right Eye worse - first. ?Dilates well - shugarcaine by protocol. ? ?2.  recommend regular lid cleaning. ?

## 2021-05-04 ENCOUNTER — Ambulatory Visit (HOSPITAL_COMMUNITY)
Admission: RE | Admit: 2021-05-04 | Discharge: 2021-05-04 | Disposition: A | Discharge status: Home / Self Care | Payer: 59 | Attending: Ophthalmology | Admitting: Ophthalmology

## 2021-05-04 ENCOUNTER — Ambulatory Visit (HOSPITAL_BASED_OUTPATIENT_CLINIC_OR_DEPARTMENT_OTHER): Payer: 59 | Admitting: Certified Registered"

## 2021-05-04 ENCOUNTER — Ambulatory Visit (HOSPITAL_COMMUNITY): Payer: 59 | Admitting: Certified Registered"

## 2021-05-04 ENCOUNTER — Encounter (HOSPITAL_COMMUNITY)
Admission: RE | Disposition: A | Discharge status: Home / Self Care | Payer: Self-pay | Source: Home / Self Care | Attending: Ophthalmology

## 2021-05-04 ENCOUNTER — Encounter (HOSPITAL_COMMUNITY): Payer: Self-pay | Admitting: Ophthalmology

## 2021-05-04 DIAGNOSIS — H01005 Unspecified blepharitis left lower eyelid: Secondary | ICD-10-CM | POA: Diagnosis not present

## 2021-05-04 DIAGNOSIS — K219 Gastro-esophageal reflux disease without esophagitis: Secondary | ICD-10-CM | POA: Insufficient documentation

## 2021-05-04 DIAGNOSIS — H01004 Unspecified blepharitis left upper eyelid: Secondary | ICD-10-CM | POA: Insufficient documentation

## 2021-05-04 DIAGNOSIS — E039 Hypothyroidism, unspecified: Secondary | ICD-10-CM | POA: Diagnosis not present

## 2021-05-04 DIAGNOSIS — I10 Essential (primary) hypertension: Secondary | ICD-10-CM

## 2021-05-04 DIAGNOSIS — H01001 Unspecified blepharitis right upper eyelid: Secondary | ICD-10-CM | POA: Diagnosis not present

## 2021-05-04 DIAGNOSIS — H01002 Unspecified blepharitis right lower eyelid: Secondary | ICD-10-CM | POA: Insufficient documentation

## 2021-05-04 DIAGNOSIS — H25811 Combined forms of age-related cataract, right eye: Secondary | ICD-10-CM

## 2021-05-04 SURGERY — PHACOEMULSIFICATION, CATARACT, WITH IOL INSERTION
Anesthesia: Monitor Anesthesia Care | Site: Eye | Laterality: Right

## 2021-05-04 MED ORDER — SODIUM HYALURONATE 23MG/ML IO SOSY
PREFILLED_SYRINGE | INTRAOCULAR | Status: DC | PRN
  Administered 2021-05-04: 0.6 mL via INTRAOCULAR

## 2021-05-04 MED ORDER — STERILE WATER FOR IRRIGATION IR SOLN
Status: DC | PRN
  Administered 2021-05-04: 250 mL

## 2021-05-04 MED ORDER — TROPICAMIDE 1 % OP SOLN
1.0000 [drp] | OPHTHALMIC | Status: AC | PRN
  Administered 2021-05-04 (×3): 1 [drp] via OPHTHALMIC
  Filled 2021-05-04: qty 3

## 2021-05-04 MED ORDER — BSS IO SOLN
INTRAOCULAR | Status: DC | PRN
  Administered 2021-05-04: 15 mL via INTRAOCULAR

## 2021-05-04 MED ORDER — TETRACAINE HCL 0.5 % OP SOLN
1.0000 [drp] | OPHTHALMIC | Status: AC | PRN
  Administered 2021-05-04 (×3): 1 [drp] via OPHTHALMIC

## 2021-05-04 MED ORDER — LIDOCAINE HCL (PF) 1 % IJ SOLN
INTRAOCULAR | Status: DC | PRN
  Administered 2021-05-04: 1 mL via OPHTHALMIC

## 2021-05-04 MED ORDER — PHENYLEPHRINE HCL 2.5 % OP SOLN
1.0000 [drp] | OPHTHALMIC | Status: AC | PRN
  Administered 2021-05-04 (×3): 1 [drp] via OPHTHALMIC

## 2021-05-04 MED ORDER — LIDOCAINE HCL 3.5 % OP GEL
1.0000 "application " | Freq: Once | OPHTHALMIC | Status: AC
  Administered 2021-05-04: 1 via OPHTHALMIC

## 2021-05-04 MED ORDER — SODIUM HYALURONATE 10 MG/ML IO SOLUTION
PREFILLED_SYRINGE | INTRAOCULAR | Status: DC | PRN
  Administered 2021-05-04: 0.85 mL via INTRAOCULAR

## 2021-05-04 MED ORDER — EPINEPHRINE PF 1 MG/ML IJ SOLN
INTRAMUSCULAR | Status: AC
  Filled 2021-05-04: qty 2

## 2021-05-04 MED ORDER — POVIDONE-IODINE 5 % OP SOLN
OPHTHALMIC | Status: DC | PRN
  Administered 2021-05-04: 1 via OPHTHALMIC

## 2021-05-04 MED ORDER — EPINEPHRINE PF 1 MG/ML IJ SOLN
INTRAOCULAR | Status: DC | PRN
  Administered 2021-05-04: 500 mL

## 2021-05-04 SURGICAL SUPPLY — 14 items
CATARACT SUITE SIGHTPATH (MISCELLANEOUS) ×2 IMPLANT
CLOTH BEACON ORANGE TIMEOUT ST (SAFETY) ×2 IMPLANT
EYE SHIELD UNIVERSAL CLEAR (GAUZE/BANDAGES/DRESSINGS) ×1 IMPLANT
FEE CATARACT SUITE SIGHTPATH (MISCELLANEOUS) ×1 IMPLANT
GLOVE SURG UNDER POLY LF SZ7 (GLOVE) ×2 IMPLANT
LENS IOL RAYNER 23.0 (Intraocular Lens) ×2 IMPLANT
LENS IOL RAYONE EMV 23.0 (Intraocular Lens) IMPLANT
NDL HYPO 18GX1.5 BLUNT FILL (NEEDLE) ×1 IMPLANT
NEEDLE HYPO 18GX1.5 BLUNT FILL (NEEDLE) ×2 IMPLANT
PAD ARMBOARD 7.5X6 YLW CONV (MISCELLANEOUS) ×2 IMPLANT
SYR TB 1ML LL NO SAFETY (SYRINGE) ×2 IMPLANT
TAPE SURG TRANSPORE 1 IN (GAUZE/BANDAGES/DRESSINGS) IMPLANT
TAPE SURGICAL TRANSPORE 1 IN (GAUZE/BANDAGES/DRESSINGS) ×2
WATER STERILE IRR 250ML POUR (IV SOLUTION) ×2 IMPLANT

## 2021-05-04 NOTE — Interval H&P Note (Signed)
History and Physical Interval Note: ? ?05/04/2021 ?9:13 AM ? ?Norma Sanchez  has presented today for surgery, with the diagnosis of combined forms age related cataract; right.  The various methods of treatment have been discussed with the patient and family. After consideration of risks, benefits and other options for treatment, the patient has consented to  Procedure(s) with comments: ?CATARACT EXTRACTION PHACO AND INTRAOCULAR LENS PLACEMENT (IOC) (Right) - CDE as a surgical intervention.  The patient's history has been reviewed, patient examined, no change in status, stable for surgery.  I have reviewed the patient's chart and labs.  Questions were answered to the patient's satisfaction.   ? ? ?[name] ? ? ?

## 2021-05-04 NOTE — Anesthesia Preprocedure Evaluation (Signed)
Anesthesia Evaluation  ?Patient identified by MRN, date of birth, ID band ?Patient awake ? ? ? ?Reviewed: ?Allergy & Precautions, H&P , NPO status , Patient's Chart, lab work & pertinent test results, reviewed documented beta blocker date and time  ? ?Airway ?Mallampati: II ? ?TM Distance: >3 FB ?Neck ROM: full ? ? ? Dental ?no notable dental hx. ? ?  ?Pulmonary ?neg pulmonary ROS,  ?  ?Pulmonary exam normal ?breath sounds clear to auscultation ? ? ? ? ? ? Cardiovascular ?Exercise Tolerance: Good ?hypertension, negative cardio ROS ? ? ?Rhythm:regular Rate:Normal ? ? ?  ?Neuro/Psych ?negative neurological ROS ? negative psych ROS  ? GI/Hepatic ?Neg liver ROS, GERD  Medicated,  ?Endo/Other  ?Hypothyroidism  ? Renal/GU ?negative Renal ROS  ?negative genitourinary ?  ?Musculoskeletal ? ? Abdominal ?  ?Peds ? Hematology ?negative hematology ROS ?(+)   ?Anesthesia Other Findings ? ? Reproductive/Obstetrics ?negative OB ROS ? ?  ? ? ? ? ? ? ? ? ? ? ? ? ? ?  ?  ? ? ? ? ? ? ? ? ?Anesthesia Physical ?Anesthesia Plan ? ?ASA: 3 ? ?Anesthesia Plan: MAC  ? ?Post-op Pain Management:   ? ?Induction:  ? ?PONV Risk Score and Plan:  ? ?Airway Management Planned:  ? ?Additional Equipment:  ? ?Intra-op Plan:  ? ?Post-operative Plan:  ? ?Informed Consent: I have reviewed the patients History and Physical, chart, labs and discussed the procedure including the risks, benefits and alternatives for the proposed anesthesia with the patient or authorized representative who has indicated his/her understanding and acceptance.  ? ? ? ?Dental Advisory Given ? ?Plan Discussed with: CRNA ? ?Anesthesia Plan Comments:   ? ? ? ? ? ? ?Anesthesia Quick Evaluation ? ?

## 2021-05-04 NOTE — Anesthesia Postprocedure Evaluation (Signed)
Anesthesia Post Note ? ?Patient: KYLEI PURINGTON ? ?Procedure(s) Performed: CATARACT EXTRACTION PHACO AND INTRAOCULAR LENS PLACEMENT (IOC) (Right: Eye) ? ?Patient location during evaluation: Phase II ?Anesthesia Type: MAC ?Level of consciousness: awake ?Pain management: pain level controlled ?Vital Signs Assessment: post-procedure vital signs reviewed and stable ?Respiratory status: spontaneous breathing and respiratory function stable ?Cardiovascular status: blood pressure returned to baseline and stable ?Postop Assessment: no headache and no apparent nausea or vomiting ?Anesthetic complications: no ?Comments: Late entry ? ? ?No notable events documented. ? ? ?Last Vitals:  ?Vitals:  ? 05/04/21 0852 05/04/21 0936  ?BP: (!) 144/75 133/80  ?Pulse: (!) 57 60  ?Resp: 17 18  ?Temp: 36.8 ?C 36.7 ?C  ?SpO2: 98% 98%  ?  ?Last Pain:  ?Vitals:  ? 05/04/21 0936  ?TempSrc: Oral  ?PainSc: 0-No pain  ? ? ?  ?  ?  ?  ?  ?  ? ?[name] ? ? ? ? ?

## 2021-05-04 NOTE — Discharge Instructions (Addendum)
Please discharge patient when stable, will follow up today with Dr. Wrzosek at the [facility] [place] office immediately following discharge.  Leave shield in place until visit.  All paperwork with discharge instructions will be given at the office.  [facility] Address:  [address]  [place],  [address]  

## 2021-05-04 NOTE — Op Note (Addendum)
Date of procedure: 05/04/21 ? ?Pre-operative diagnosis:  Visually significant combined form age-related cataract, Right Eye (H25.811) ? ?Post-operative diagnosis:  Visually significant combined form age-related cataract, Right Eye (H25.811) ? ?Procedure: Removal of cataract via phacoemulsification and insertion of intra-ocular lens Rayner RAO200E +23.0D into the capsular bag of the Right Eye ? ?Attending surgeon: Gerda Diss. Marisa Hua, MD, MA ? ?Anesthesia: MAC, Topical Akten ? ?Complications: None ? ?Estimated Blood Loss: <62m (minimal) ? ?Specimens: None ? ?Implants: As above ? ?Indications:  Visually significant age-related cataract, Right Eye ? ?Procedure:  ?The patient was seen and identified in the pre-operative area. The operative eye was identified and dilated.  The operative eye was marked.  Topical anesthesia was administered to the operative eye.    ? ?The patient was then to the operative suite and placed in the supine position.  A timeout was performed confirming the patient, procedure to be performed, and all other relevant information.   The patient's face was prepped and draped in the usual fashion for intra-ocular surgery.  A lid speculum was placed into the operative eye and the surgical microscope moved into place and focused.  A superotemporal paracentesis was created using a 20 gauge paracentesis blade.  Shugarcaine was injected into the anterior chamber.  Viscoelastic was injected into the anterior chamber.  A temporal clear-corneal main wound incision was created using a 2.418mmicrokeratome.  A continuous curvilinear capsulorrhexis was initiated using an irrigating cystitome and completed using capsulorrhexis forceps.  Hydrodissection and hydrodeliniation were performed.  Viscoelastic was injected into the anterior chamber.  A phacoemulsification handpiece and a chopper as a second instrument were used to remove the nucleus and epinucleus. The irrigation/aspiration handpiece was used to remove any  remaining cortical material.  ? ?The capsular bag was reinflated with viscoelastic, checked, and found to be intact.  The intraocular lens was inserted into the capsular bag.  The irrigation/aspiration handpiece was used to remove any remaining viscoelastic.  The clear corneal wound and paracentesis wounds were then hydrated and checked with Weck-Cels to be watertight.  The lid-speculum was removed.  The drape was removed.  The patient's face was cleaned with a wet and dry 4x4.  A clear shield was taped over the eye. The patient was taken to the post-operative care unit in good condition, having tolerated the procedure well. ? ?Post-Op Instructions: The [facility] and will receive all other orders and instructions. ? ?

## 2021-05-04 NOTE — Transfer of Care (Signed)
Immediate Anesthesia Transfer of Care Note ? ?Patient: Norma Sanchez ? ?Procedure(s) Performed: CATARACT EXTRACTION PHACO AND INTRAOCULAR LENS PLACEMENT (IOC) (Right: Eye) ? ?Patient Location: Short Stay ? ?Anesthesia Type: Mac ? ?Level of Consciousness: awake, alert  and oriented ? ?Airway & Oxygen Therapy: Patient Spontanous Breathing ? ?Post-op Assessment: Report given to RN and Post -op Vital signs reviewed and stable ? ?Post vital signs: Reviewed and stable ? ?Last Vitals:  ?Vitals Value Taken Time  ?BP    ?Temp    ?Pulse    ?Resp    ?SpO2    ? ? ?Last Pain:  ?Vitals:  ? 05/04/21 0852  ?TempSrc: Oral  ?PainSc: 0-No pain  ?   ? ?  ? ?Complications: No notable events documented. ?

## 2021-05-05 ENCOUNTER — Other Ambulatory Visit (HOSPITAL_COMMUNITY): Payer: Self-pay

## 2021-05-05 ENCOUNTER — Encounter (HOSPITAL_COMMUNITY): Payer: Self-pay | Admitting: Ophthalmology

## 2021-05-05 ENCOUNTER — Other Ambulatory Visit: Payer: Self-pay | Admitting: Nurse Practitioner

## 2021-05-05 MED ORDER — PROPRANOLOL HCL 80 MG PO TABS
80.0000 mg | ORAL_TABLET | Freq: Two times a day (BID) | ORAL | 1 refills | Status: DC
  Filled 2021-05-05: qty 180, 90d supply, fill #0
  Filled 2021-08-05 (×2): qty 180, 90d supply, fill #1

## 2021-05-11 DIAGNOSIS — H25812 Combined forms of age-related cataract, left eye: Secondary | ICD-10-CM | POA: Diagnosis not present

## 2021-05-13 ENCOUNTER — Encounter (HOSPITAL_COMMUNITY): Payer: Self-pay

## 2021-05-13 ENCOUNTER — Other Ambulatory Visit: Payer: Self-pay

## 2021-05-13 ENCOUNTER — Encounter (HOSPITAL_COMMUNITY)
Admission: RE | Admit: 2021-05-13 | Discharge: 2021-05-13 | Disposition: A | Discharge status: Home / Self Care | Payer: 59 | Source: Ambulatory Visit | Attending: Ophthalmology | Admitting: Ophthalmology

## 2021-05-13 NOTE — H&P (Signed)
Surgical History & Physical ? ?Patient Name: Norma Sanchez DOB: [birth year] ? ?Surgery: Cataract extraction with intraocular lens implant phacoemulsification; Left Eye ? ?Surgeon: [name] MD ?Surgery Date:  05-18-21 ?Pre-Op Date:  05-11-21 ? ?HPI: ?A 67 Yr. old female patient 1. The patient is returning for a 4 days follow-up of the right eye. Since the last visit, the affected area is doing well. The patient's vision is stable. Patient is following medication instructions. The patient's left vision is blurry. The complaint is associated with light sensitivity. The patient has difficulty preforming daily tasks such as driving at night due to the glare of headlights, reading small print on medicine bottles/books, and recognize faces at a distance. This is negatively affecting the patient's quality of life and the patient is unable to function adequately in life with the current level of vision. HPI was performed by [name] . ? ?Medical History: ?Macula Degeneration ?Glaucoma ?Diabetes ?High Blood Pressure ?LDL ? ?Review of Systems ?Negative Allergic/Immunologic ?Negative Cardiovascular ?Negative Constitutional ?Negative Ear, Nose, Mouth & Throat ?Negative Endocrine ?Negative Eyes ?Negative Gastrointestinal ?Negative Genitourinary ?Negative Hemotologic/Lymphatic ?Negative Integumentary ?Negative Musculoskeletal ?Negative Neurological ?Negative Psychiatry ?Negative Respiratory ? ?Social ?  Never smoked  ? ?Medication ?Prednisolone-Moxifloxacin-Bromfenac,  ?Metformin, Synthroid, Lipitor, Atacand, Propranolol, Amiloride-HCTZ,  ? ?Sx/Procedures ?Phaco c IOL OD,  ?Tonsilectomy, Histarectomy, Knee Surgery x2, Hand Surgery, Thumb procedure,  ? ?Drug Allergies  ?Sulfa, Codeine, Cillin family, Hydrochlorothiazide,  ? ?History & Physical: ?Heent: Cataract, Left Eye ?NECK: supple without bruits ?LUNGS: lungs clear to auscultation ?CV: regular rate and rhythm ?Abdomen: soft and non-tender ?Impression &  Plan: ?Assessment: ?1.  CATARACT EXTRACTION STATUS; Right Eye (Z98.41) ?2.  COMBINED FORMS AGE RELATED CATARACT; Left Eye [phone]) ? ?Plan: 1.  1 week after cataract surgery. Doing well with improved vision and normal eye pressure. Call with any problems or concerns. ?Continue Pred-Moxi-Brom 2x/day for 3 more weeks. ? ?2.  Cataract accounts for the patient's decreased vision. This visual impairment is not correctable with a tolerable change in glasses or contact lenses. Cataract surgery with an implantation of a new lens should significantly improve the visual and functional status of the patient. Discussed all risks, benefits, alternatives, and potential complications. Discussed the procedures and recovery. Patient desires to have surgery. A-scan ordered and performed today for intra-ocular lens calculations. The surgery will be performed in order to improve vision for driving, reading, and for eye examinations. Recommend phacoemulsification with intra-ocular lens. Recommend Dextenza for post-operative pain and inflammation. ?Left Eye. ?Surgery required to correct imbalance of vision. ?Dilates well - shugarcaine by protocol. ?

## 2021-05-15 ENCOUNTER — Other Ambulatory Visit: Payer: Self-pay | Admitting: Internal Medicine

## 2021-05-15 MED ORDER — ATORVASTATIN CALCIUM 10 MG PO TABS
10.0000 mg | ORAL_TABLET | Freq: Every day | ORAL | 3 refills | Status: DC
  Filled 2021-05-15 – 2021-08-05 (×2): qty 90, 90d supply, fill #0
  Filled 2021-11-03: qty 90, 90d supply, fill #1
  Filled 2022-02-09: qty 90, 90d supply, fill #2
  Filled 2022-05-10: qty 90, 90d supply, fill #3

## 2021-05-18 ENCOUNTER — Other Ambulatory Visit (HOSPITAL_COMMUNITY): Payer: Self-pay

## 2021-05-18 ENCOUNTER — Encounter (HOSPITAL_COMMUNITY): Payer: Self-pay | Admitting: Ophthalmology

## 2021-05-18 ENCOUNTER — Encounter (HOSPITAL_COMMUNITY)
Admission: RE | Disposition: A | Discharge status: Home / Self Care | Payer: Self-pay | Source: Home / Self Care | Attending: Ophthalmology

## 2021-05-18 ENCOUNTER — Ambulatory Visit (HOSPITAL_COMMUNITY)
Admission: RE | Admit: 2021-05-18 | Discharge: 2021-05-18 | Disposition: A | Discharge status: Home / Self Care | Payer: 59 | Attending: Ophthalmology | Admitting: Ophthalmology

## 2021-05-18 ENCOUNTER — Ambulatory Visit (HOSPITAL_COMMUNITY): Payer: 59 | Admitting: Certified Registered"

## 2021-05-18 ENCOUNTER — Ambulatory Visit (HOSPITAL_BASED_OUTPATIENT_CLINIC_OR_DEPARTMENT_OTHER): Payer: 59 | Admitting: Certified Registered"

## 2021-05-18 DIAGNOSIS — I10 Essential (primary) hypertension: Secondary | ICD-10-CM | POA: Insufficient documentation

## 2021-05-18 DIAGNOSIS — Z6841 Body Mass Index (BMI) 40.0 and over, adult: Secondary | ICD-10-CM | POA: Diagnosis not present

## 2021-05-18 DIAGNOSIS — E039 Hypothyroidism, unspecified: Secondary | ICD-10-CM | POA: Diagnosis not present

## 2021-05-18 DIAGNOSIS — Z9841 Cataract extraction status, right eye: Secondary | ICD-10-CM | POA: Insufficient documentation

## 2021-05-18 DIAGNOSIS — H25812 Combined forms of age-related cataract, left eye: Secondary | ICD-10-CM

## 2021-05-18 HISTORY — PX: CATARACT EXTRACTION W/PHACO: SHX586

## 2021-05-18 LAB — GLUCOSE, CAPILLARY: Glucose-Capillary: 96 mg/dL (ref 70–99)

## 2021-05-18 SURGERY — PHACOEMULSIFICATION, CATARACT, WITH IOL INSERTION
Anesthesia: Monitor Anesthesia Care | Site: Eye | Laterality: Left

## 2021-05-18 MED ORDER — TROPICAMIDE 1 % OP SOLN
1.0000 [drp] | OPHTHALMIC | Status: AC | PRN
  Administered 2021-05-18 (×3): 1 [drp] via OPHTHALMIC

## 2021-05-18 MED ORDER — POVIDONE-IODINE 5 % OP SOLN
OPHTHALMIC | Status: DC | PRN
  Administered 2021-05-18: 1 via OPHTHALMIC

## 2021-05-18 MED ORDER — TETRACAINE HCL 0.5 % OP SOLN
1.0000 [drp] | OPHTHALMIC | Status: AC | PRN
  Administered 2021-05-18 (×3): 1 [drp] via OPHTHALMIC

## 2021-05-18 MED ORDER — LIDOCAINE HCL 3.5 % OP GEL
1.0000 "application " | Freq: Once | OPHTHALMIC | Status: AC
  Administered 2021-05-18: 1 via OPHTHALMIC

## 2021-05-18 MED ORDER — SODIUM HYALURONATE 10 MG/ML IO SOLUTION
PREFILLED_SYRINGE | INTRAOCULAR | Status: DC | PRN
  Administered 2021-05-18: 0.85 mL via INTRAOCULAR

## 2021-05-18 MED ORDER — SODIUM HYALURONATE 23MG/ML IO SOSY
PREFILLED_SYRINGE | INTRAOCULAR | Status: DC | PRN
  Administered 2021-05-18: 0.6 mL via INTRAOCULAR

## 2021-05-18 MED ORDER — EPINEPHRINE PF 1 MG/ML IJ SOLN
INTRAOCULAR | Status: DC | PRN
  Administered 2021-05-18: 500 mL

## 2021-05-18 MED ORDER — STERILE WATER FOR IRRIGATION IR SOLN
Status: DC | PRN
  Administered 2021-05-18: 250 mL

## 2021-05-18 MED ORDER — BSS IO SOLN
INTRAOCULAR | Status: DC | PRN
  Administered 2021-05-18: 15 mL via INTRAOCULAR

## 2021-05-18 MED ORDER — PHENYLEPHRINE HCL 2.5 % OP SOLN
1.0000 [drp] | OPHTHALMIC | Status: AC | PRN
  Administered 2021-05-18 (×3): 1 [drp] via OPHTHALMIC

## 2021-05-18 MED ORDER — LIDOCAINE HCL (PF) 1 % IJ SOLN
INTRAOCULAR | Status: DC | PRN
  Administered 2021-05-18: 1 mL via OPHTHALMIC

## 2021-05-18 MED ORDER — EPINEPHRINE PF 1 MG/ML IJ SOLN
INTRAMUSCULAR | Status: AC
  Filled 2021-05-18: qty 2

## 2021-05-18 SURGICAL SUPPLY — 16 items
CATARACT SUITE SIGHTPATH (MISCELLANEOUS) ×2 IMPLANT
CLOTH BEACON ORANGE TIMEOUT ST (SAFETY) ×2 IMPLANT
EYE SHIELD UNIVERSAL CLEAR (GAUZE/BANDAGES/DRESSINGS) ×1 IMPLANT
FEE CATARACT SUITE SIGHTPATH (MISCELLANEOUS) ×1 IMPLANT
GLOVE SURG UNDER POLY LF SZ6.5 (GLOVE) IMPLANT
GLOVE SURG UNDER POLY LF SZ7 (GLOVE) IMPLANT
GLOVE SURG UNDER POLY LF SZ7.5 (GLOVE) ×1 IMPLANT
LENS IOL RAYNER 22.0 (Intraocular Lens) ×2 IMPLANT
LENS IOL RAYONE EMV 22.0 (Intraocular Lens) IMPLANT
NDL HYPO 18GX1.5 BLUNT FILL (NEEDLE) ×1 IMPLANT
NEEDLE HYPO 18GX1.5 BLUNT FILL (NEEDLE) ×2 IMPLANT
PAD ARMBOARD 7.5X6 YLW CONV (MISCELLANEOUS) ×2 IMPLANT
SYR TB 1ML LL NO SAFETY (SYRINGE) ×2 IMPLANT
TAPE SURG TRANSPORE 1 IN (GAUZE/BANDAGES/DRESSINGS) IMPLANT
TAPE SURGICAL TRANSPORE 1 IN (GAUZE/BANDAGES/DRESSINGS) ×2
WATER STERILE IRR 250ML POUR (IV SOLUTION) ×2 IMPLANT

## 2021-05-18 NOTE — Discharge Instructions (Signed)
Please discharge patient when stable, will follow up today with Dr. Jackey Housey at the [facility] [place] office immediately following discharge.  Leave shield in place until visit.  All paperwork with discharge instructions will be given at the office.  [facility] Address:  [address]  [place], [place] [address]  

## 2021-05-18 NOTE — Anesthesia Procedure Notes (Signed)
Procedure Name: Hodge ?Date/Time: 05/18/2021 9:26 AM ?Performed by: Karna Dupes, CRNA ?Pre-anesthesia Checklist: Patient identified, Emergency Drugs available, Suction available and Patient being monitored ?Oxygen Delivery Method: Nasal cannula ? ? ? ? ?

## 2021-05-18 NOTE — Interval H&P Note (Signed)
History and Physical Interval Note: ? ?05/18/2021 ?9:21 AM ? ?[name]  has presented today for surgery, with the diagnosis of combined forms age related cataract; left.  The various methods of treatment have been discussed with the patient and family. After consideration of risks, benefits and other options for treatment, the patient has consented to  Procedure(s) with comments: ?CATARACT EXTRACTION PHACO AND INTRAOCULAR LENS PLACEMENT (IOC) (Left) - left as a surgical intervention.  The patient's history has been reviewed, patient examined, no change in status, stable for surgery.  I have reviewed the patient's chart and labs.  Questions were answered to the patient's satisfaction.   ? ? ?Norma Sanchez ? ? ?

## 2021-05-18 NOTE — Anesthesia Preprocedure Evaluation (Signed)
Anesthesia Evaluation  ?Patient identified by MRN, date of birth, ID band ?Patient awake ? ? ? ?Reviewed: ?Allergy & Precautions, H&P , NPO status , Patient's Chart, lab work & pertinent test results, reviewed documented beta blocker date and time  ? ?Airway ?Mallampati: II ? ?TM Distance: >3 FB ?Neck ROM: full ? ? ? Dental ?no notable dental hx. ? ?  ?Pulmonary ?neg pulmonary ROS,  ?  ?Pulmonary exam normal ?breath sounds clear to auscultation ? ? ? ? ? ? Cardiovascular ?Exercise Tolerance: Good ?hypertension, negative cardio ROS ? ? ?Rhythm:regular Rate:Normal ? ? ?  ?Neuro/Psych ?negative neurological ROS ? negative psych ROS  ? GI/Hepatic ?Neg liver ROS, GERD  Medicated,  ?Endo/Other  ?Hypothyroidism Morbid obesity ? Renal/GU ?negative Renal ROS  ?negative genitourinary ?  ?Musculoskeletal ? ? Abdominal ?  ?Peds ? Hematology ?negative hematology ROS ?(+)   ?Anesthesia Other Findings ? ? Reproductive/Obstetrics ?negative OB ROS ? ?  ? ? ? ? ? ? ? ? ? ? ? ? ? ?  ?  ? ? ? ? ? ? ? ? ?Anesthesia Physical ? ?Anesthesia Plan ? ?ASA: 3 ? ?Anesthesia Plan: MAC  ? ?Post-op Pain Management:   ? ?Induction:  ? ?PONV Risk Score and Plan:  ? ?Airway Management Planned:  ? ?Additional Equipment:  ? ?Intra-op Plan:  ? ?Post-operative Plan:  ? ?Informed Consent: I have reviewed the patients History and Physical, chart, labs and discussed the procedure including the risks, benefits and alternatives for the proposed anesthesia with the patient or authorized representative who has indicated his/her understanding and acceptance.  ? ? ? ?Dental Advisory Given ? ?Plan Discussed with: CRNA ? ?Anesthesia Plan Comments:   ? ? ? ? ? ? ?Anesthesia Quick Evaluation ? ?

## 2021-05-18 NOTE — Op Note (Signed)
Date of procedure: 05/18/21 ? ?Pre-operative diagnosis: Visually significant age-related combined cataract, Left Eye (H25.812) ? ?Post-operative diagnosis: Visually significant age-related combined cataract, Left Eye (H25.812) ? ?Procedure: Removal of cataract via phacoemulsification and insertion of intra-ocular lens Rayner RAO200E +22.0D into the capsular bag of the Left Eye ? ?Attending surgeon: Gerda Diss. Marisa Hua, MD, MA ? ?Anesthesia: MAC, Topical Akten ? ?Complications: None ? ?Estimated Blood Loss: <53m (minimal) ? ?Specimens: None ? ?Implants: As above ? ?Indications:  Visually significant age-related cataract, Left Eye ? ?Procedure:  ?The patient was seen and identified in the pre-operative area. The operative eye was identified and dilated.  The operative eye was marked.  Topical anesthesia was administered to the operative eye.    ? ?The patient was then to the operative suite and placed in the supine position.  A timeout was performed confirming the patient, procedure to be performed, and all other relevant information.   The patient's face was prepped and draped in the usual fashion for intra-ocular surgery.  A lid speculum was placed into the operative eye and the surgical microscope moved into place and focused.  An inferotemporal paracentesis was created using a 20 gauge paracentesis blade.  Shugarcaine was injected into the anterior chamber.  Viscoelastic was injected into the anterior chamber.  A temporal clear-corneal main wound incision was created using a 2.410mmicrokeratome.  A continuous curvilinear capsulorrhexis was initiated using an irrigating cystitome and completed using capsulorrhexis forceps.  Hydrodissection and hydrodeliniation were performed.  Viscoelastic was injected into the anterior chamber.  A phacoemulsification handpiece and a chopper as a second instrument were used to remove the nucleus and epinucleus. The irrigation/aspiration handpiece was used to remove any remaining  cortical material.  ? ?The capsular bag was reinflated with viscoelastic, checked, and found to be intact.  The intraocular lens was inserted into the capsular bag.  The irrigation/aspiration handpiece was used to remove any remaining viscoelastic.  The clear corneal wound and paracentesis wounds were then hydrated and checked with Weck-Cels to be watertight. The lid-speculum was removed.  The drape was removed.  The patient's face was cleaned with a wet and dry 4x4.    A clear shield was taped over the eye. The patient was taken to the post-operative care unit in good condition, having tolerated the procedure well. ? ?Post-Op Instructions: [facility] and will receive all other orders and instructions.  ?

## 2021-05-18 NOTE — Transfer of Care (Signed)
Immediate Anesthesia Transfer of Care Note ? ?Patient: Norma Sanchez ? ?Procedure(s) Performed: CATARACT EXTRACTION PHACO AND INTRAOCULAR LENS PLACEMENT (IOC) (Left: Eye) ? ?Patient Location: Short Stay ? ?Anesthesia Type:MAC ? ?Level of Consciousness: awake, alert  and oriented ? ?Airway & Oxygen Therapy: Patient Spontanous Breathing ? ?Post-op Assessment: Report given to RN and Post -op Vital signs reviewed and stable ? ?Post vital signs: Reviewed and stable ? ?Last Vitals:  ?Vitals Value Taken Time  ?BP    ?Temp    ?Pulse    ?Resp    ?SpO2    ? ? ?Last Pain:  ?Vitals:  ? 05/18/21 0823  ?TempSrc: Oral  ?PainSc: 0-No pain  ?   ? ?Patients Stated Pain Goal: 8 (05/18/21 2778) ? ?Complications: No notable events documented. ?

## 2021-05-18 NOTE — Progress Notes (Signed)
EKG done in post op per anesthesia. Shown to Dr. Johnnette Litter who spoke with patient. ?

## 2021-05-19 NOTE — Anesthesia Postprocedure Evaluation (Signed)
Anesthesia Post Note ? ?Patient: Norma Sanchez ? ?Procedure(s) Performed: CATARACT EXTRACTION PHACO AND INTRAOCULAR LENS PLACEMENT (IOC) (Left: Eye) ? ?Patient location during evaluation: Phase II ?Anesthesia Type: MAC ?Level of consciousness: awake ?Pain management: pain level controlled ?Vital Signs Assessment: post-procedure vital signs reviewed and stable ?Respiratory status: spontaneous breathing and respiratory function stable ?Cardiovascular status: blood pressure returned to baseline and stable ?Postop Assessment: no headache and no apparent nausea or vomiting ?Anesthetic complications: no ?Comments: Late entry ? ? ?No notable events documented. ? ? ?Last Vitals:  ?Vitals:  ? 05/18/21 0823 05/18/21 0946  ?BP: 125/77 131/75  ?Pulse: 64 66  ?Resp: 20 20  ?Temp: 36.9 ?C 36.9 ?C  ?SpO2: 100% 98%  ?  ?Last Pain:  ?Vitals:  ? 05/18/21 0946  ?TempSrc: Oral  ?PainSc: 0-No pain  ? ? ?  ?  ?  ?  ?  ?  ? ?[name] ? ? ? ? ?

## 2021-05-20 ENCOUNTER — Encounter (HOSPITAL_COMMUNITY): Payer: Self-pay | Admitting: Ophthalmology

## 2021-05-20 ENCOUNTER — Encounter: Payer: 59 | Admitting: Adult Health

## 2021-05-26 ENCOUNTER — Other Ambulatory Visit (HOSPITAL_COMMUNITY): Payer: Self-pay

## 2021-06-10 ENCOUNTER — Other Ambulatory Visit (HOSPITAL_COMMUNITY): Payer: Self-pay

## 2021-07-01 ENCOUNTER — Other Ambulatory Visit (HOSPITAL_COMMUNITY): Payer: Self-pay

## 2021-07-01 ENCOUNTER — Other Ambulatory Visit: Payer: Self-pay | Admitting: Internal Medicine

## 2021-07-01 MED ORDER — ESCITALOPRAM OXALATE 20 MG PO TABS
20.0000 mg | ORAL_TABLET | Freq: Every day | ORAL | 3 refills | Status: DC
  Filled 2021-07-01: qty 90, 90d supply, fill #0
  Filled 2021-11-03: qty 90, 90d supply, fill #1
  Filled 2022-02-09: qty 90, 90d supply, fill #2
  Filled 2022-05-10: qty 90, 90d supply, fill #3

## 2021-07-27 ENCOUNTER — Encounter: Payer: 59 | Admitting: Adult Health

## 2021-07-31 ENCOUNTER — Encounter: Payer: Self-pay | Admitting: Adult Health

## 2021-07-31 ENCOUNTER — Ambulatory Visit (INDEPENDENT_AMBULATORY_CARE_PROVIDER_SITE_OTHER): Payer: 59 | Admitting: Adult Health

## 2021-07-31 ENCOUNTER — Other Ambulatory Visit (HOSPITAL_COMMUNITY): Payer: Self-pay

## 2021-07-31 VITALS — BP 122/74 | HR 62 | Temp 97.2°F | Ht 60.25 in | Wt 227.8 lb

## 2021-07-31 DIAGNOSIS — E210 Primary hyperparathyroidism: Secondary | ICD-10-CM

## 2021-07-31 DIAGNOSIS — I4510 Unspecified right bundle-branch block: Secondary | ICD-10-CM

## 2021-07-31 DIAGNOSIS — R7309 Other abnormal glucose: Secondary | ICD-10-CM

## 2021-07-31 DIAGNOSIS — Z0000 Encounter for general adult medical examination without abnormal findings: Secondary | ICD-10-CM

## 2021-07-31 DIAGNOSIS — E559 Vitamin D deficiency, unspecified: Secondary | ICD-10-CM | POA: Diagnosis not present

## 2021-07-31 DIAGNOSIS — Z0001 Encounter for general adult medical examination with abnormal findings: Secondary | ICD-10-CM

## 2021-07-31 DIAGNOSIS — E785 Hyperlipidemia, unspecified: Secondary | ICD-10-CM

## 2021-07-31 DIAGNOSIS — Z79899 Other long term (current) drug therapy: Secondary | ICD-10-CM | POA: Diagnosis not present

## 2021-07-31 DIAGNOSIS — Z1329 Encounter for screening for other suspected endocrine disorder: Secondary | ICD-10-CM | POA: Diagnosis not present

## 2021-07-31 DIAGNOSIS — E039 Hypothyroidism, unspecified: Secondary | ICD-10-CM

## 2021-07-31 DIAGNOSIS — I10 Essential (primary) hypertension: Secondary | ICD-10-CM | POA: Diagnosis not present

## 2021-07-31 DIAGNOSIS — Z136 Encounter for screening for cardiovascular disorders: Secondary | ICD-10-CM

## 2021-07-31 DIAGNOSIS — Z1389 Encounter for screening for other disorder: Secondary | ICD-10-CM

## 2021-07-31 DIAGNOSIS — E2839 Other primary ovarian failure: Secondary | ICD-10-CM

## 2021-07-31 DIAGNOSIS — Z13820 Encounter for screening for osteoporosis: Secondary | ICD-10-CM

## 2021-07-31 MED ORDER — PHENTERMINE HCL 37.5 MG PO TABS
37.5000 mg | ORAL_TABLET | Freq: Every day | ORAL | 2 refills | Status: DC
  Filled 2021-07-31: qty 30, 30d supply, fill #0
  Filled 2021-11-03: qty 30, 30d supply, fill #1
  Filled 2021-12-01: qty 30, 30d supply, fill #2

## 2021-07-31 MED ORDER — WEGOVY 0.25 MG/0.5ML ~~LOC~~ SOAJ
0.2500 mg | SUBCUTANEOUS | 0 refills | Status: DC
  Filled 2021-07-31 – 2021-08-05 (×2): qty 2, 28d supply, fill #0

## 2021-07-31 NOTE — Progress Notes (Signed)
Complete Physical  Assessment and Plan:  Encounter for Annual Physical Exam with abnormal findings Due annually  Health Maintenance reviewed Healthy lifestyle reviewed and goals set - send covid 19 vaccine dates  Essential hypertension - continue medications, DASH diet, exercise and monitor at home. Call if greater than 130/80.  - CBC with Differential/Platelet - CMP/GFR - Magnesium  - Urinalysis, Routine w reflex microscopic (not at [facility]) - Microalbumin / creatinine urine ratio - EKG 12-Lead  Hypothyroidism, unspecified hypothyroidism type Hypothyroidism-check TSH level, continue medications the same, reminded to take on an empty stomach 30-63mins before food.  - TSH  Morbid obesity, unspecified obesity type (HCC) - BMI 46 Obesity with co morbidities- long discussion about weight loss, diet, and exercise      -  Lack of progress with phentermine though helping to maintain      -  Discussed and will attempt wegovy pending insurance coverage - 0.25 mg weekly x 1 month then taper up if tolerating - follow up 6-8 weeks for progress monitoring - increase veggies, decrease carbs - long discussion about weight loss, diet, and exercise - discussed working on stress/workload, sleep quality, small changes  High cholesterol -continue medications, check lipids, decrease fatty foods, increase activity.  - Lipid panel   Medication management - Magnesium   Hot flashes, menopausal  Continue lexapro, PRN oxybutynin rarely  Off of estrogen and managing Weight loss encouraged   Vitamin D deficiency - VITAMIN D 25 Hydroxy (Vit-D Deficiency, Fractures)  Gastroesophageal reflux disease with esophagitis Continue PPI/H2 blocker, diet discussed  Primary hyperparathyroidism (HCC) Mild, avoiding calcium supplements/tums No hx of renal stones Screen for osteoporosis - DEXA at [facility]  Monitor   CRBBB Denies sx; after discussion will monitor in office, see above plan for reducing CVD  risks, defer ECHO or cardiology for now Follow up if new dyspnea, cough, CP, fatigue, dizziness, etc  Orders Placed This Encounter  Procedures   DG Bone Density   CBC with Differential/Platelet   COMPLETE METABOLIC PANEL WITH GFR   Magnesium   Lipid panel   TSH   Hemoglobin A1c   VITAMIN D 25 Hydroxy (Vit-D Deficiency, Fractures)   Microalbumin / creatinine urine ratio   Urinalysis, Routine w reflex microscopic   EKG 12-Lead     Discussed med's effects and SE's. Screening labs and tests as requested with regular follow-up as recommended. Future Appointments  Date Time Provider Department Center  09/25/2021  9:30 AM Adela Glimpse, NP [facility] None  01/29/2022  9:30 AM Adela Glimpse, NP [facility] None  08/02/2022 10:00 AM Raynelle Dick, NP [facility] None    HPI  61 y.o. female  presents for a complete physical. She has Bilateral leg and foot pain; Morbid obesity with BMI of 45.0-49.9, adult (HCC); Hypertension; GERD (gastroesophageal reflux disease); Hypothyroidism; Hyperlipidemia; Chronic cough; Abnormal glucose; Hot flashes, menopausal; Vitamin D deficiency; Medication management; Arthritis of carpometacarpal (CMC) joint of left thumb; Primary hyperparathyroidism (HCC); and Complete right bundle branch block (RBBB) on their problem list.  She is married, no children, has labs and chickens. She works [employer] at [employer], enjoys her job. Lots of stress due to just transitioning mom with Alzheimer's to assisted living, family is not supportive.   Denies any medical concerns.   She has hot flashes, is on lexapro 20 mg, also rarely takes oxyutynin 5 mg as needed for severe symptoms.   She does have bil knee pain, follows with Dr. Magnus Ivan.   BMI is Body mass index is  44.12 kg/m., she has not been working on diet and exercise. She has been working 13-14 hour days, works 3rd shift, she admits to not drinking enough water and eating too much sugar. She is stressed with her  mother with possible dementia, no help from her family. Has been off of the phentermine 2 weeks, report weight was down but regains quickly off.  Wt Readings from Last 3 Encounters:  07/31/21 227 lb 12.8 oz (103.3 kg)  05/18/21 229 lb 15 oz (104.3 kg)  05/13/21 229 lb 15 oz (104.3 kg)    Her blood pressure has been controlled at home, today their BP is BP: 122/74 She does not workout due to time restraints.  She denies chest pain, shortness of breath, dizziness.   She is on cholesterol medication, lipitor 10 daily and denies myalgias. Her cholesterol is at goal. The cholesterol last visit was:   Lab Results  Component Value Date   CHOL 150 01/29/2021   HDL 61 01/29/2021   LDLCALC 72 01/29/2021   TRIG 85 01/29/2021   CHOLHDL 2.5 01/29/2021    She has been working on diet and exercise for prediabetes, she is on metoformin 4 pill a day, and denies paresthesia of the feet, polydipsia, polyuria and visual disturbances. Last A1C in the office was:  Lab Results  Component Value Date   HGBA1C 5.8 (H) 01/29/2021   Patient has been off of vitamin D supplement due to high calcium, previously on 1000 IU Lab Results  Component Value Date   VD25OH 75 07/25/2020    She is on thyroid medication. Her medication was not changed last visit.  137 mcg daily.  Lab Results  Component Value Date   TSH 0.86 01/29/2021   She has mild hypercalcemia, felt most likely mild hyperparathyroid. Had 24 h urine calcium of 142, no familial calcium. No hx of renal stones, hasn't had DEXA screening.  Lab Results  Component Value Date   PTH 42 03/03/2021   CALCIUM 10.8 (H) 03/03/2021   CAION 1.21 06/15/2007     Current Medications:   Current Outpatient Medications (Endocrine & Metabolic):    metFORMIN (GLUCOPHAGE-XR) 500 MG 24 hr tablet, Take 2 tablets (1,000 mg total) by mouth 2 (two) times daily.   SYNTHROID 137 MCG tablet, TAKE 1 TAB BY MOUTH DAILY ON AN EMPTY STOMACH WITH ONLY WATER FOR 30 MINUTES & NO  ANTACID MEDS, CALCIUM, MAGNESIUM FOR 4 HOURS & AVOID BIOTIN  Current Outpatient Medications (Cardiovascular):    aMILoride (MIDAMOR) 5 MG tablet, Take 1 tablet (5 mg total) by mouth daily.   atorvastatin (LIPITOR) 10 MG tablet, Take  1 tablet by mouth once daily for Cholesterol   candesartan (ATACAND) 32 MG tablet, Take 1 tablet (32 mg total) by mouth nightly for blood pressure   propranolol (INDERAL) 80 MG tablet, Take 1 tablet (80 mg total) by mouth in the morning and in the evening for blood pressure.   EPINEPHRINE 0.3 mg/0.3 mL IJ SOAJ injection, USE AS DIRECTED (Patient not taking: Reported on 07/31/2021)  Current Outpatient Medications (Respiratory):    cetirizine (ZYRTEC) 10 MG tablet, Take 10 mg by mouth daily.   fluticasone (FLONASE) 50 MCG/ACT nasal spray, Place 1 spray into both nostrils daily.  Current Outpatient Medications (Analgesics):    aspirin EC 81 MG tablet, Take 81 mg by mouth daily. Swallow whole.   traMADol (ULTRAM) 50 MG tablet, Take 1 tablet (50 mg total) by mouth every 12 (twelve) hours as needed (cough).  Current  Outpatient Medications (Hematological):    cyanocobalamin 2000 MCG tablet, Take 2,000 mcg by mouth daily.  Current Outpatient Medications (Other):    Ascorbic Acid (VITAMIN C) 1000 MG tablet, Take 1,000 mg by mouth daily.   buPROPion (WELLBUTRIN XL) 150 MG 24 hr tablet, Take 1 tablet (150 mg total) by mouth every morning.   escitalopram (LEXAPRO) 20 MG tablet, Take 1 tablet (20 mg total) by mouth daily for mood.   esomeprazole (NEXIUM) 40 MG capsule, Take 1 capsule (40 mg total) by mouth daily to prevent heartburn & indigestion   Glucosamine-Chondroitin (GLUCOSAMINE CHONDR COMPLEX PO), Take 1,500 mg by mouth. 1500mg /1200mg   Actual dose   ketoconazole (NIZORAL) 2 % cream, Apply 1 application topically 2 (two) times daily.   metroNIDAZOLE (METROCREAM) 0.75 % cream, Apply topically 2 (two) times daily.   nystatin (MYCOSTATIN/NYSTOP) powder, Apply daily after  a shower as needed   oxybutynin (DITROPAN) 5 MG tablet, Take 1 tablet (5 mg total) by mouth 2 (two) times daily as needed (sweating).   Semaglutide-Weight Management (WEGOVY) 0.25 MG/0.5ML SOAJ, Inject 0.25 mg into the skin once a week. Contact office for next dose up if tolerating well.   triamcinolone cream (KENALOG) 0.5 %, Apply 1 application topically 2 (two) times daily.   Cholecalciferol (VITAMIN D-3) 5000 UNITS TABS, Take 5,000 Units by mouth daily. (Patient not taking: Reported on 07/31/2021)   phentermine (ADIPEX-P) 37.5 MG tablet, Take 1 tablet (37.5 mg total) by mouth daily before breakfast.  Health Maintenance:   Immunization History  Administered Date(s) Administered   DTaP 12/07/2012   Influenza-Unspecified 11/22/2012, 11/12/2014, 11/17/2017, 11/21/2020   Pneumococcal Polysaccharide-23 02/22/2009   Td 02/23/2000   Tdap 12/07/2012   Zoster, Live 10/29/2010   Health Maintenance  Topic Date Due   COVID-19 Vaccine (1) 08/16/2021 (Originally 04/24/1961)   INFLUENZA VACCINE  09/22/2021   MAMMOGRAM  12/30/2021   TETANUS/TDAP  12/08/2022   PAP SMEAR-Modifier  02/25/2023   COLONOSCOPY (Pts 45-20yrs Insurance coverage will need to be confirmed)  05/06/2024   Hepatitis C Screening  Completed   HIV Screening  Completed   HPV VACCINES  Aged Out   Zoster Vaccines- Shingrix  Discontinued   Zostavax: 2012, had a reaction, declines shringrix COVID 19: 2/2, will send photo  LMP: 2005 p. Hysterectomy, still has 1 ovary and cervix Pap: 02/2018 due 5 years,  + stress incontinence MGM: 12/30/2020 at [facility] DEXA: N/A due age 45  TB gold- negative Colonoscopy: 04/2014, Dr. 05/2014 due 10 years.  EGD: N/A  Madilyn Fireman thyroid and BX 2013, Dr. 2014.   Last Dental Exam: Dr. Sharl Ma, goes q66m  Last Eye Exam: Dr. 11m, glasses, goes annually, did have cataracts done 2023, doing   Patient Care Team: 2024, MD as PCP - General (Internal Medicine) Lucky Cowboy, MD (Inactive) as Consulting  Physician (Gastroenterology) Dorena Cookey, MD as Consulting Physician (Endocrinology)  Medical History:  Past Medical History:  Diagnosis Date   Biliary dyskinesia 12/07/2011   GERD (gastroesophageal reflux disease)    High cholesterol    Hypertension    Hypothyroidism    Multinodular goiter    Allergies Allergies  Allergen Reactions   Avelox [Moxifloxacin Hcl In Nacl] Hives    Thrush, throat might have closed    Hydrochlorothiazide Itching    On hands and feet   Other Itching    All "Cillins", hives alao   Penicillins     Hives   Prednisone     Chest pains   Zostavax 12/09/2011  Vaccine Live]    Codeine Hives, Itching and Rash   Levothyroxine Rash    Only to generic. Not to brand synthroid.    Sulfa Antibiotics Itching and Rash    Hands and feet itch and turn red    SURGICAL HISTORY She  has a past surgical history that includes Carpal tunnel release (yrs ago); Knee surgery (arthroscopic, 3 yrs ago); Tonsillectomy (age 16); Abdominal hysterectomy (8 yrs ago); Tubal ligation (yrs ago); radioactive iodine to thyroid' (march 2013); Cholecystectomy (12/28/2011); Cataract extraction w/PHACO (Right, 05/04/2021); and Cataract extraction w/PHACO (Left, 05/18/2021). FAMILY HISTORY Her family history includes AAA (abdominal aortic aneurysm) in her father; Alcoholism in her sister; Alzheimer's disease in her mother; Breast cancer (age of onset: 65) in her mother; CAD in her brother; Heart attack in her father and paternal grandmother; Heart attack (age of onset: 63) in her brother; Stroke in her father and mother; Suicidality in her maternal grandfather and maternal grandmother. SOCIAL HISTORY She  reports that she has never smoked. She has never used smokeless tobacco. She reports that she does not drink alcohol and does not use drugs.  Review of Systems  Constitutional: Negative.  Negative for chills, fever, malaise/fatigue and weight loss.  HENT: Negative.  Negative for hearing loss and  tinnitus.   Eyes: Negative.  Negative for blurred vision and double vision.  Respiratory: Negative.  Negative for cough, shortness of breath and wheezing.   Cardiovascular: Negative.  Negative for chest pain, palpitations, orthopnea, claudication and leg swelling.  Gastrointestinal: Negative.  Negative for abdominal pain, blood in stool, constipation, diarrhea, heartburn, melena, nausea and vomiting.  Genitourinary: Negative.  Negative for dysuria, flank pain, frequency, hematuria and urgency.  Musculoskeletal:  Positive for joint pain. Negative for back pain, falls, myalgias and neck pain.  Skin:  Negative for rash.  Neurological: Negative.  Negative for dizziness, tingling, sensory change, weakness and headaches.  Endo/Heme/Allergies: Negative.  Negative for polydipsia.  Psychiatric/Behavioral: Negative.  Negative for depression. The patient does not have insomnia.   All other systems reviewed and are negative.    Physical Exam: Estimated body mass index is 44.12 kg/m as calculated from the following:   Height as of this encounter: 5' 0.25" (1.53 m).   Weight as of this encounter: 227 lb 12.8 oz (103.3 kg). BP 122/74   Pulse 62   Temp (!) 97.2 F (36.2 C)   Ht 5' 0.25" (1.53 m)   Wt 227 lb 12.8 oz (103.3 kg)   SpO2 97%   BMI 44.12 kg/m  General Appearance: Well nourished, in no apparent distress.  Eyes: PERRLA, EOMs, conjunctiva no swelling or erythema Sinuses: No Frontal/maxillary tenderness  ENT/Mouth: Ext aud canals clear, normal light reflex with TMs without erythema, bulging. Good dentition. No erythema, swelling, or exudate on post pharynx. Tonsils not swollen or erythematous. Hearing normal.  Neck: Supple, thyroid normal. No bruits  Respiratory: Respiratory effort normal, BS equal bilaterally without rales, rhonchi, wheezing or stridor.  Cardio: RRR without murmurs, rubs or gallops. Brisk peripheral pulses without edema.  Chest: symmetric, with normal excursions and  percussion.  Breasts: Declined today Abdomen: Soft, nontender, morbidly obese limiting exam, no guarding, rebound, hernias, masses, or organomegaly. .  Lymphatics: Non tender without lymphadenopathy.  Genitourinary: defer Musculoskeletal: Limited exam by body habitus, symmetrical ROM all peripheral extremities,5/5 strength, and normal gait.  Skin: Warm, dry without rashes, lesions, ecchymosis. Neuro: Cranial nerves intact, reflexes equal bilaterally. Normal muscle tone, no cerebellar symptoms. Sensation intact.  Psych: Awake and  oriented X 3, normal affect, Insight and Judgment appropriate.   EKG: Sinus bradycardia, CRBBB, stable from last 6 months ago  AORTA SCAN:  Get at age 61, family hx of AAA in father  Dan Makershley C Reagan Behlke, NP-C 12:23 PM [facility]

## 2021-07-31 NOTE — Patient Instructions (Addendum)
[name] , Thank you for taking time to come for your Annual Wellness Visit. I appreciate your ongoing commitment to your health goals. Please review the following plan we discussed and let me know if I can assist you in the future.   These are the goals we discussed:  Goals      Weight (lb) < 220 lb (99.8 kg)        This is a list of the screening recommended for you and due dates:  Health Maintenance  Topic Date Due   COVID-19 Vaccine (1) 08/16/2021*   Flu Shot  09/22/2021   Mammogram  12/30/2021   Tetanus Vaccine  12/08/2022   Pap Smear  02/25/2023   Colon Cancer Screening  05/06/2024   Hepatitis C Screening: USPSTF Recommendation to screen - Ages 18-79 yo.  Completed   HIV Screening  Completed   HPV Vaccine  Aged Out   Zoster (Shingles) Vaccine  Discontinued  *Topic was postponed. The date shown is not the original due date.       Know what a healthy weight is for you (roughly BMI <25) and aim to maintain this  Aim for 7+ servings of fruits and vegetables daily  65-80+ fluid ounces of water or unsweet tea for healthy kidneys  Limit to max 1 drink of alcohol per day; avoid smoking/tobacco  Limit animal fats in diet for cholesterol and heart health - choose grass fed whenever available  Avoid highly processed foods, and foods high in saturated/trans fats  Aim for low stress - take time to unwind and care for your mental health  Aim for 150 min of moderate intensity exercise weekly for heart health, and weights twice weekly for bone health  Aim for 7-9 hours of sleep daily      Semaglutide Injection (Weight Management) What is this medication? SEMAGLUTIDE (SEM a GLOO tide) promotes weight loss. It may also be used to maintain weight loss. It works by decreasing appetite. Changes to diet and exercise are often combined with this medication. This medicine may be used for other purposes; ask your health care provider or pharmacist if you have questions. COMMON  BRAND NAME(S): AJGOTL What should I tell my care team before I take this medication? They need to know if you have any of these conditions: Endocrine tumors (MEN 2) or if someone in your family had these tumors Eye disease, vision problems Gallbladder disease History of depression or mental health disease History of pancreatitis Kidney disease Stomach or intestine problems Suicidal thoughts, plans, or attempt; a previous suicide attempt by you or a family member Thyroid cancer or if someone in your family had thyroid cancer An unusual or allergic reaction to semaglutide, other medications, foods, dyes, or preservatives Pregnant or trying to get pregnant Breast-feeding How should I use this medication? This medication is injected under the skin. You will be taught how to prepare and give it. Take it as directed on the prescription label. It is given once every week (every 7 days). Keep taking it unless your care team tells you to stop. It is important that you put your used needles and pens in a special sharps container. Do not put them in a trash can. If you do not have a sharps container, call your pharmacist or care team to get one. A special MedGuide will be given to you by the pharmacist with each prescription and refill. Be sure to read this information carefully each time. This medication comes with INSTRUCTIONS  FOR USE. Ask your pharmacist for directions on how to use this medication. Read the information carefully. Talk to your pharmacist or care team if you have questions. Talk to your care team about the use of this medication in children. While it may be prescribed for children as young as 12 years for selected conditions, precautions do apply. Overdosage: If you think you have taken too much of this medicine contact a poison control center or emergency room at once. NOTE: This medicine is only for you. Do not share this medicine with others. What if I miss a dose? If you miss a  dose and the next scheduled dose is more than 2 days away, take the missed dose as soon as possible. If you miss a dose and the next scheduled dose is less than 2 days away, do not take the missed dose. Take the next dose at your regular time. Do not take double or extra doses. If you miss your dose for 2 weeks or more, take the next dose at your regular time or call your care team to talk about how to restart this medication. What may interact with this medication? Insulin and other medications for diabetes This list may not describe all possible interactions. Give your health care provider a list of all the medicines, herbs, non-prescription drugs, or dietary supplements you use. Also tell them if you smoke, drink alcohol, or use illegal drugs. Some items may interact with your medicine. What should I watch for while using this medication? Visit your care team for regular checks on your progress. It may be some time before you see the benefit from this medication. Drink plenty of fluids while taking this medication. Check with your care team if you have severe diarrhea, nausea, and vomiting, or if you sweat a lot. The loss of too much body fluid may make it dangerous for you to take this medication. This medication may affect blood sugar levels. Ask your care team if changes in diet or medications are needed if you have diabetes. If you or your family notice any changes in your behavior, such as new or worsening depression, thoughts of harming yourself, anxiety, other unusual or disturbing thoughts, or memory loss, call your care team right away. Women should inform their care team if they wish to become pregnant or think they might be pregnant. Losing weight while pregnant is not advised and may cause harm to the unborn child. Talk to your care team for more information. What side effects may I notice from receiving this medication? Side effects that you should report to your care team as soon as  possible: Allergic reactions--skin rash, itching, hives, swelling of the face, lips, tongue, or throat Change in vision Dehydration--increased thirst, dry mouth, feeling faint or lightheaded, headache, dark yellow or brown urine Gallbladder problems--severe stomach pain, nausea, vomiting, fever Heart palpitations--rapid, pounding, or irregular heartbeat Kidney injury--decrease in the amount of urine, swelling of the ankles, hands, or feet Pancreatitis--severe stomach pain that spreads to your back or gets worse after eating or when touched, fever, nausea, vomiting Thoughts of suicide or self-harm, worsening mood, feelings of depression Thyroid cancer--new mass or lump in the neck, pain or trouble swallowing, trouble breathing, hoarseness Side effects that usually do not require medical attention (report to your care team if they continue or are bothersome): Diarrhea Loss of appetite Nausea Stomach pain Vomiting This list may not describe all possible side effects. Call your doctor for medical advice about side effects.  You may report side effects to FDA at 1-800-FDA-1088. Where should I keep my medication? Keep out of the reach of children and pets. Refrigeration (preferred): Store in the refrigerator. Do not freeze. Keep this medication in the original container until you are ready to take it. Get rid of any unused medication after the expiration date. Room temperature: If needed, prior to cap removal, the pen can be stored at room temperature for up to 28 days. Protect from light. If it is stored at room temperature, get rid of any unused medication after 28 days or after it expires, whichever is first. It is important to get rid of the medication as soon as you no longer need it or it is expired. You can do this in two ways: Take the medication to a medication take-back program. Check with your pharmacy or law enforcement to find a location. If you cannot return the medication, follow the  directions in the MedGuide. NOTE: This sheet is a summary. It may not cover all possible information. If you have questions about this medicine, talk to your doctor, pharmacist, or health care provider.  2023 Elsevier/Gold Standard (2021-02-25 00:00:00)

## 2021-08-01 LAB — MICROALBUMIN / CREATININE URINE RATIO
Creatinine, Urine: 114 mg/dL (ref 20–275)
Microalb Creat Ratio: 12 mcg/mg creat (ref <30)
Microalb, Ur: 1.4 mg/dL

## 2021-08-01 LAB — COMPLETE METABOLIC PANEL WITH GFR
AG Ratio: 1.6 (calc) (ref 1.0–2.5)
ALT: 16 U/L (ref 6–29)
AST: 13 U/L (ref 10–35)
Albumin: 4.2 g/dL (ref 3.6–5.1)
Alkaline phosphatase (APISO): 99 U/L (ref 37–153)
BUN: 12 mg/dL (ref 7–25)
CO2: 27 mmol/L (ref 20–32)
Calcium: 10.7 mg/dL — ABNORMAL HIGH (ref 8.6–10.4)
Chloride: 104 mmol/L (ref 98–110)
Creat: 0.7 mg/dL (ref 0.50–1.05)
Globulin: 2.7 g/dL (calc) (ref 1.9–3.7)
Glucose, Bld: 85 mg/dL (ref 65–99)
Potassium: 5.4 mmol/L — ABNORMAL HIGH (ref 3.5–5.3)
Sodium: 139 mmol/L (ref 135–146)
Total Bilirubin: 0.2 mg/dL (ref 0.2–1.2)
Total Protein: 6.9 g/dL (ref 6.1–8.1)
eGFR: 98 mL/min/{1.73_m2} (ref >=60)

## 2021-08-01 LAB — CBC WITH DIFFERENTIAL/PLATELET
Absolute Monocytes: 538 cells/uL (ref 200–950)
Basophils Absolute: 115 cells/uL (ref 0–200)
Basophils Relative: 1.2 %
Eosinophils Absolute: 470 cells/uL (ref 15–500)
Eosinophils Relative: 4.9 %
HCT: 42.1 % (ref 35.0–45.0)
Hemoglobin: 14.2 g/dL (ref 11.7–15.5)
Lymphs Abs: 2995 cells/uL (ref 850–3900)
MCH: 28.9 pg (ref 27.0–33.0)
MCHC: 33.7 g/dL (ref 32.0–36.0)
MCV: 85.7 fL (ref 80.0–100.0)
MPV: 11.2 fL (ref 7.5–12.5)
Monocytes Relative: 5.6 %
Neutro Abs: 5482 cells/uL (ref 1500–7800)
Neutrophils Relative %: 57.1 %
Platelets: 342 10*3/uL (ref 140–400)
RBC: 4.91 10*6/uL (ref 3.80–5.10)
RDW: 12.8 % (ref 11.0–15.0)
Total Lymphocyte: 31.2 %
WBC: 9.6 10*3/uL (ref 3.8–10.8)

## 2021-08-01 LAB — MICROSCOPIC MESSAGE

## 2021-08-01 LAB — URINALYSIS, ROUTINE W REFLEX MICROSCOPIC
Bacteria, UA: NONE SEEN /HPF
Bilirubin Urine: NEGATIVE
Glucose, UA: NEGATIVE
Hgb urine dipstick: NEGATIVE
Hyaline Cast: NONE SEEN /LPF
Ketones, ur: NEGATIVE
Nitrite: NEGATIVE
Protein, ur: NEGATIVE
Specific Gravity, Urine: 1.021 (ref 1.001–1.035)
pH: <=5 (ref 5.0–8.0)

## 2021-08-01 LAB — LIPID PANEL
Cholesterol: 155 mg/dL (ref <200)
HDL: 58 mg/dL (ref >=50)
LDL Cholesterol (Calc): 79 mg/dL (calc)
Non-HDL Cholesterol (Calc): 97 mg/dL (calc) (ref <130)
Total CHOL/HDL Ratio: 2.7 (calc) (ref <5.0)
Triglycerides: 95 mg/dL (ref <150)

## 2021-08-01 LAB — TSH: TSH: 0.38 mIU/L — ABNORMAL LOW (ref 0.40–4.50)

## 2021-08-01 LAB — MAGNESIUM: Magnesium: 1.8 mg/dL (ref 1.5–2.5)

## 2021-08-01 LAB — HEMOGLOBIN A1C
Hgb A1c MFr Bld: 5.6 % of total Hgb (ref <5.7)
Mean Plasma Glucose: 114 mg/dL
eAG (mmol/L): 6.3 mmol/L

## 2021-08-01 LAB — VITAMIN D 25 HYDROXY (VIT D DEFICIENCY, FRACTURES): Vit D, 25-Hydroxy: 39 ng/mL (ref 30–100)

## 2021-08-05 ENCOUNTER — Other Ambulatory Visit: Payer: Self-pay | Admitting: Nurse Practitioner

## 2021-08-05 ENCOUNTER — Encounter: Payer: Self-pay | Admitting: Adult Health

## 2021-08-05 ENCOUNTER — Other Ambulatory Visit (HOSPITAL_COMMUNITY): Payer: Self-pay

## 2021-08-05 MED ORDER — CANDESARTAN CILEXETIL 32 MG PO TABS
32.0000 mg | ORAL_TABLET | Freq: Every evening | ORAL | 1 refills | Status: DC
  Filled 2021-08-05: qty 90, 90d supply, fill #0
  Filled 2021-12-01: qty 90, 90d supply, fill #1

## 2021-08-06 ENCOUNTER — Other Ambulatory Visit: Payer: Self-pay | Admitting: Adult Health

## 2021-08-06 ENCOUNTER — Other Ambulatory Visit (HOSPITAL_COMMUNITY): Payer: Self-pay

## 2021-08-06 MED ORDER — WEGOVY 1 MG/0.5ML ~~LOC~~ SOAJ
1.0000 mg | SUBCUTANEOUS | 1 refills | Status: DC
  Filled 2021-08-06: qty 2, 28d supply, fill #0

## 2021-08-14 ENCOUNTER — Other Ambulatory Visit: Payer: Self-pay | Admitting: Adult Health

## 2021-08-14 ENCOUNTER — Telehealth: Payer: Self-pay | Admitting: Adult Health

## 2021-08-14 ENCOUNTER — Encounter: Payer: Self-pay | Admitting: Adult Health

## 2021-08-14 ENCOUNTER — Other Ambulatory Visit (HOSPITAL_COMMUNITY): Payer: Self-pay

## 2021-08-14 DIAGNOSIS — R112 Nausea with vomiting, unspecified: Secondary | ICD-10-CM

## 2021-08-14 MED ORDER — ONDANSETRON HCL 8 MG PO TABS
8.0000 mg | ORAL_TABLET | Freq: Three times a day (TID) | ORAL | 1 refills | Status: DC | PRN
  Filled 2021-08-14: qty 30, 10d supply, fill #0

## 2021-08-21 ENCOUNTER — Other Ambulatory Visit: Payer: Self-pay | Admitting: Adult Health

## 2021-08-21 ENCOUNTER — Other Ambulatory Visit (HOSPITAL_COMMUNITY): Payer: Self-pay

## 2021-08-21 DIAGNOSIS — B372 Candidiasis of skin and nail: Secondary | ICD-10-CM

## 2021-08-21 MED ORDER — TRIAMCINOLONE ACETONIDE 0.5 % EX CREA
1.0000 | TOPICAL_CREAM | Freq: Two times a day (BID) | CUTANEOUS | 2 refills | Status: DC
  Filled 2021-08-21: qty 75, 30d supply, fill #0

## 2021-08-21 MED ORDER — TRIAMCINOLONE ACETONIDE 0.5 % EX CREA: 1.0000 | TOPICAL_CREAM | Freq: Two times a day (BID) | CUTANEOUS | 2 refills | Status: DC

## 2021-08-24 ENCOUNTER — Ambulatory Visit: Payer: 59 | Admitting: Nurse Practitioner

## 2021-08-24 ENCOUNTER — Other Ambulatory Visit (HOSPITAL_COMMUNITY): Payer: Self-pay

## 2021-08-24 VITALS — BP 102/62 | HR 79 | Temp 97.3°F | Wt 221.0 lb

## 2021-08-24 DIAGNOSIS — T50905D Adverse effect of unspecified drugs, medicaments and biological substances, subsequent encounter: Secondary | ICD-10-CM

## 2021-08-24 DIAGNOSIS — L710 Perioral dermatitis: Secondary | ICD-10-CM | POA: Diagnosis not present

## 2021-08-24 DIAGNOSIS — B372 Candidiasis of skin and nail: Secondary | ICD-10-CM

## 2021-08-24 MED ORDER — METRONIDAZOLE 0.75 % EX CREA
TOPICAL_CREAM | Freq: Two times a day (BID) | CUTANEOUS | 1 refills | Status: DC
  Filled 2021-08-24: qty 45, 30d supply, fill #0

## 2021-08-24 MED ORDER — NYSTATIN 100000 UNIT/GM EX POWD
CUTANEOUS | 2 refills | Status: DC
  Filled 2021-08-24: qty 60, 20d supply, fill #0

## 2021-08-24 NOTE — Progress Notes (Signed)
Assessment and Plan:  Norma Sanchez was seen today for an episodic visit.  Diagnoses and all order for this visit:  1. Adverse effect of drug, subsequent encounter Continue to monitor Discussed re-trail of Semaglutide in the future once rash clears.  2. Perioral dermatitis Keep area clean and free of moisture.  - metroNIDAZOLE (METROCREAM) 0.75 % cream; Apply topically 2 (two) times daily.  Dispense: 45 g; Refill: 1  3. Yeast dermatitis Keep area clean and free of moisture.  - nystatin (MYCOSTATIN/NYSTOP) powder; Apply daily after a shower as needed  Dispense: 60 g; Refill: 2  Continue to monitor for worsening symptoms.  Notify office for further evaluation and treatment, questions or concerns if s/s fail to improve. The risks and benefits of my recommendations, as well as other treatment options were discussed with the patient today. Questions were answered.  Further disposition pending results of labs. Discussed med's effects and SE's.    Over 15 minutes of exam, counseling, chart review, and critical decision making was performed.   Future Appointments  Date Time Provider Department Center  09/25/2021  9:30 AM Adela Glimpse, NP [facility] None  01/29/2022  9:30 AM Adela Glimpse, NP [facility] None  08/02/2022 10:00 AM Raynelle Dick, NP [facility] None    ------------------------------------------------------------------------------------------------------------------   HPI BP 102/62   Pulse 79   Temp (!) 97.3 F (36.3 C)   Wt 221 lb (100.2 kg)   SpO2 98%   BMI 42.80 kg/m  60 y.o.female presents for evaluation of generalized rash that has not responded to oral antihistamine or topical steroid cream.  Approximately 3 weeks ago she started [employer]  She injected herself with a high initial dose and became nauseas woith vomiting.  During this time she also noted a rash and assumed she was having an allergic reaction.  She continues to have a warm, red rash that  has spread from the base of her neck to the folds of her arms, under her breast an between her groin area.  She is no longer taking the Semaglutide.    Past Medical History:  Diagnosis Date   Biliary dyskinesia 12/07/2011   GERD (gastroesophageal reflux disease)    High cholesterol    Hypertension    Hypothyroidism    Multinodular goiter      Allergies  Allergen Reactions   Avelox [Moxifloxacin Hcl In Nacl] Hives    Thrush, throat might have closed    Hydrochlorothiazide Itching    On hands and feet   Other Itching    All "Cillins", hives alao   Penicillins     Hives   Prednisone     Chest pains   Zostavax [Zoster Vaccine Live]    Codeine Hives, Itching and Rash   Levothyroxine Rash    Only to generic. Not to brand synthroid.    Sulfa Antibiotics Itching and Rash    Hands and feet itch and turn red    Current Outpatient Medications on File Prior to Visit  Medication Sig   aMILoride (MIDAMOR) 5 MG tablet Take 1 tablet (5 mg total) by mouth daily.   Ascorbic Acid (VITAMIN C) 1000 MG tablet Take 1,000 mg by mouth daily.   aspirin EC 81 MG tablet Take 81 mg by mouth daily. Swallow whole.   atorvastatin (LIPITOR) 10 MG tablet Take 1 tablet (10 mg total) by mouth daily for Cholesterol   buPROPion (WELLBUTRIN XL) 150 MG 24 hr tablet Take 1 tablet (150 mg total) by mouth every morning.  candesartan (ATACAND) 32 MG tablet Take 1 tablet (32 mg total) by mouth nightly for blood pressure   cetirizine (ZYRTEC) 10 MG tablet Take 10 mg by mouth daily.   cyanocobalamin 2000 MCG tablet Take 2,000 mcg by mouth daily.   escitalopram (LEXAPRO) 20 MG tablet Take 1 tablet (20 mg total) by mouth daily for mood.   esomeprazole (NEXIUM) 40 MG capsule Take 1 capsule (40 mg total) by mouth daily to prevent heartburn & indigestion   fluticasone (FLONASE) 50 MCG/ACT nasal spray Place 1 spray into both nostrils daily.   Glucosamine-Chondroitin (GLUCOSAMINE CHONDR COMPLEX PO) Take 1,500 mg by mouth.  1500mg /1200mg   Actual dose   ketoconazole (NIZORAL) 2 % cream Apply 1 application topically 2 (two) times daily.   metFORMIN (GLUCOPHAGE-XR) 500 MG 24 hr tablet Take 2 tablets (1,000 mg total) by mouth 2 (two) times daily.   oxybutynin (DITROPAN) 5 MG tablet Take 1 tablet (5 mg total) by mouth 2 (two) times daily as needed (sweating).   phentermine (ADIPEX-P) 37.5 MG tablet Take 1 tablet (37.5 mg total) by mouth daily before breakfast.   propranolol (INDERAL) 80 MG tablet Take 1 tablet (80 mg total) by mouth in the morning and in the evening for blood pressure.   Semaglutide-Weight Management (WEGOVY) 1 MG/0.5ML SOAJ Inject 1 mg into the skin once a week.   SYNTHROID 137 MCG tablet TAKE 1 TAB BY MOUTH DAILY ON AN EMPTY STOMACH WITH ONLY WATER FOR 30 MINUTES & NO ANTACID MEDS, CALCIUM, MAGNESIUM FOR 4 HOURS & AVOID BIOTIN   traMADol (ULTRAM) 50 MG tablet Take 1 tablet (50 mg total) by mouth every 12 (twelve) hours as needed (cough).   triamcinolone cream (KENALOG) 0.5 % Apply 1 application topically 2 (two) times daily.   Cholecalciferol (VITAMIN D-3) 5000 UNITS TABS Take 5,000 Units by mouth daily. (Patient not taking: Reported on 07/31/2021)   EPINEPHRINE 0.3 mg/0.3 mL IJ SOAJ injection USE AS DIRECTED (Patient not taking: Reported on 07/31/2021)   ondansetron (ZOFRAN) 8 MG tablet Take 1 tablet (8 mg total) by mouth up to 3 (three) times daily as needed for nausea/vomiting. (Patient not taking: Reported on 08/24/2021)   No current facility-administered medications on file prior to visit.    ROS: all negative except what is noted in the HPI.    Physical Exam:  BP 102/62   Pulse 79   Temp (!) 97.3 F (36.3 C)   Wt 221 lb (100.2 kg)   SpO2 98%   BMI 42.80 kg/m   General Appearance: NAD.  Awake, conversant and cooperative. Eyes: PERRLA, EOMs intact.  Sclera white.  Conjunctiva without erythema. Sinuses: No frontal/maxillary tenderness.  No nasal discharge. Nares patent.  ENT/Mouth: Ext aud  canals clear.  Bilateral TMs w/DOL and without erythema or bulging. Hearing intact.  Posterior pharynx without swelling or exudate.  Tonsils without swelling or erythema.  Neck: Supple.  No masses, nodules or thyromegaly. Respiratory: Effort is regular with non-labored breathing. Breath sounds are equal bilaterally without rales, rhonchi, wheezing or stridor.  Cardio: RRR with no MRGs. Brisk peripheral pulses without edema.  Abdomen: Active BS in all four quadrants.  Soft and non-tender without guarding, rebound tenderness, hernias or masses. Lymphatics: Non tender without lymphadenopathy.  Musculoskeletal: Full ROM, 5/5 strength, normal ambulation.  No clubbing or cyanosis. Skin: Base of posterior neck, bilateral folds of arms with irregularly shaped flat areas of erythema.  Warm.  Surround skin WNL. Neuro: CN II-XII grossly normal. Normal muscle tone without cerebellar  symptoms and intact sensation.   Psych: AO X 3,  appropriate mood and affect, insight and judgment.     Adela Glimpse, NP 11:58 AM [facility]

## 2021-08-26 ENCOUNTER — Other Ambulatory Visit (HOSPITAL_COMMUNITY): Payer: Self-pay

## 2021-08-26 MED ORDER — FLUCONAZOLE 150 MG PO TABS
150.0000 mg | ORAL_TABLET | Freq: Once | ORAL | 0 refills | Status: AC
  Filled 2021-08-26: qty 1, 1d supply, fill #0

## 2021-08-28 ENCOUNTER — Other Ambulatory Visit (HOSPITAL_COMMUNITY): Payer: Self-pay

## 2021-08-31 ENCOUNTER — Encounter: Payer: Self-pay | Admitting: Nurse Practitioner

## 2021-08-31 ENCOUNTER — Other Ambulatory Visit: Payer: Self-pay | Admitting: Nurse Practitioner

## 2021-08-31 MED ORDER — FLUCONAZOLE 150 MG PO TABS
150.0000 mg | ORAL_TABLET | Freq: Every day | ORAL | 0 refills | Status: DC
  Filled 2021-08-31: qty 10, 10d supply, fill #0

## 2021-09-01 ENCOUNTER — Other Ambulatory Visit (HOSPITAL_COMMUNITY): Payer: Self-pay

## 2021-09-10 ENCOUNTER — Other Ambulatory Visit (HOSPITAL_COMMUNITY): Payer: Self-pay

## 2021-09-10 DIAGNOSIS — L309 Dermatitis, unspecified: Secondary | ICD-10-CM | POA: Diagnosis not present

## 2021-09-10 MED ORDER — TRIAMCINOLONE ACETONIDE 0.025 % EX CREA
1.0000 | TOPICAL_CREAM | Freq: Two times a day (BID) | CUTANEOUS | 0 refills | Status: AC
  Filled 2021-09-10: qty 454, 30d supply, fill #0

## 2021-09-11 ENCOUNTER — Other Ambulatory Visit (HOSPITAL_COMMUNITY): Payer: Self-pay

## 2021-09-16 ENCOUNTER — Other Ambulatory Visit: Payer: Self-pay

## 2021-09-16 ENCOUNTER — Other Ambulatory Visit (HOSPITAL_COMMUNITY): Payer: Self-pay

## 2021-09-16 ENCOUNTER — Other Ambulatory Visit: Payer: Self-pay | Admitting: Nurse Practitioner

## 2021-09-16 DIAGNOSIS — F3341 Major depressive disorder, recurrent, in partial remission: Secondary | ICD-10-CM

## 2021-09-16 MED ORDER — BUPROPION HCL ER (XL) 150 MG PO TB24
150.0000 mg | ORAL_TABLET | Freq: Every morning | ORAL | 3 refills | Status: DC
  Filled 2021-09-16: qty 67, 67d supply, fill #0
  Filled 2021-09-16: qty 23, 23d supply, fill #0
  Filled 2021-12-16: qty 90, 90d supply, fill #1
  Filled 2022-03-15: qty 90, 90d supply, fill #2
  Filled 2022-06-14: qty 90, 90d supply, fill #3

## 2021-09-17 ENCOUNTER — Other Ambulatory Visit (HOSPITAL_COMMUNITY): Payer: Self-pay

## 2021-09-17 ENCOUNTER — Other Ambulatory Visit: Payer: Self-pay | Admitting: Adult Health

## 2021-09-17 DIAGNOSIS — I10 Essential (primary) hypertension: Secondary | ICD-10-CM

## 2021-09-17 MED ORDER — AMILORIDE HCL 5 MG PO TABS
5.0000 mg | ORAL_TABLET | Freq: Every day | ORAL | 1 refills | Status: DC
  Filled 2021-09-17: qty 90, 90d supply, fill #0
  Filled 2021-12-16: qty 90, 90d supply, fill #1

## 2021-09-25 ENCOUNTER — Ambulatory Visit: Payer: 59 | Admitting: Nurse Practitioner

## 2021-10-16 ENCOUNTER — Other Ambulatory Visit (HOSPITAL_COMMUNITY): Payer: Self-pay

## 2021-11-03 ENCOUNTER — Other Ambulatory Visit (HOSPITAL_COMMUNITY): Payer: Self-pay

## 2021-11-03 ENCOUNTER — Other Ambulatory Visit: Payer: Self-pay

## 2021-11-03 ENCOUNTER — Other Ambulatory Visit: Payer: Self-pay | Admitting: Nurse Practitioner

## 2021-11-04 ENCOUNTER — Telehealth: Payer: Self-pay | Admitting: Nurse Practitioner

## 2021-11-04 ENCOUNTER — Other Ambulatory Visit (HOSPITAL_COMMUNITY): Payer: Self-pay

## 2021-11-04 MED ORDER — PROPRANOLOL HCL 80 MG PO TABS
80.0000 mg | ORAL_TABLET | Freq: Two times a day (BID) | ORAL | 1 refills | Status: DC
  Filled 2021-11-04: qty 180, 90d supply, fill #0
  Filled 2022-02-09: qty 180, 90d supply, fill #1

## 2021-11-04 MED ORDER — ESOMEPRAZOLE MAGNESIUM 40 MG PO CPDR
40.0000 mg | DELAYED_RELEASE_CAPSULE | Freq: Every day | ORAL | 1 refills | Status: DC
  Filled 2021-11-04 (×2): qty 90, 90d supply, fill #0
  Filled 2022-02-09: qty 90, 90d supply, fill #1

## 2021-11-04 MED ORDER — SYNTHROID 137 MCG PO TABS
137.0000 ug | ORAL_TABLET | Freq: Every day | ORAL | 1 refills | Status: DC
  Filled 2021-11-04: qty 90, 90d supply, fill #0
  Filled 2022-02-09: qty 90, 90d supply, fill #1

## 2021-11-04 NOTE — Telephone Encounter (Signed)
Needing a refill on Synthroid to go to [facility]

## 2021-11-04 NOTE — Addendum Note (Signed)
Addended by: Dionicio Stall on: 11/04/2021 04:46 PM   Modules accepted: Orders

## 2021-12-01 ENCOUNTER — Other Ambulatory Visit (HOSPITAL_COMMUNITY): Payer: Self-pay

## 2021-12-16 ENCOUNTER — Other Ambulatory Visit (HOSPITAL_COMMUNITY): Payer: Self-pay

## 2022-01-11 ENCOUNTER — Other Ambulatory Visit (HOSPITAL_COMMUNITY): Payer: Self-pay

## 2022-01-11 ENCOUNTER — Other Ambulatory Visit: Payer: Self-pay

## 2022-01-11 ENCOUNTER — Telehealth: Payer: Self-pay | Admitting: Nurse Practitioner

## 2022-01-11 ENCOUNTER — Other Ambulatory Visit: Payer: Self-pay | Admitting: Nurse Practitioner

## 2022-01-11 MED ORDER — METFORMIN HCL ER 500 MG PO TB24
1000.0000 mg | ORAL_TABLET | Freq: Two times a day (BID) | ORAL | 95 refills | Status: DC
  Filled 2022-01-12: qty 360, 90d supply, fill #0
  Filled 2022-04-05: qty 360, 90d supply, fill #1
  Filled 2022-05-10 – 2022-07-14 (×2): qty 360, 90d supply, fill #2
  Filled 2022-10-16: qty 360, 90d supply, fill #3

## 2022-01-11 MED ORDER — PHENTERMINE HCL 37.5 MG PO TABS
37.5000 mg | ORAL_TABLET | Freq: Every day | ORAL | 2 refills | Status: DC
  Filled 2022-01-11: qty 30, 30d supply, fill #0
  Filled 2022-02-09: qty 30, 30d supply, fill #1
  Filled 2022-03-15: qty 30, 30d supply, fill #2

## 2022-01-11 NOTE — Telephone Encounter (Signed)
Patient is requesting refills on Metformin and Phentermine (90 days if possible) to [facility]

## 2022-01-12 ENCOUNTER — Other Ambulatory Visit (HOSPITAL_COMMUNITY): Payer: Self-pay

## 2022-01-27 DIAGNOSIS — Z1231 Encounter for screening mammogram for malignant neoplasm of breast: Secondary | ICD-10-CM | POA: Diagnosis not present

## 2022-01-27 LAB — HM MAMMOGRAPHY

## 2022-01-29 ENCOUNTER — Ambulatory Visit: Payer: 59 | Admitting: Nurse Practitioner

## 2022-02-04 ENCOUNTER — Ambulatory Visit: Payer: 59 | Admitting: Nurse Practitioner

## 2022-02-09 ENCOUNTER — Other Ambulatory Visit: Payer: Self-pay

## 2022-02-18 LAB — HM DEXA SCAN

## 2022-02-22 ENCOUNTER — Encounter: Payer: Self-pay | Admitting: Nurse Practitioner

## 2022-02-24 ENCOUNTER — Encounter: Payer: Self-pay | Admitting: Internal Medicine

## 2022-03-11 ENCOUNTER — Ambulatory Visit (INDEPENDENT_AMBULATORY_CARE_PROVIDER_SITE_OTHER): Payer: Commercial Managed Care - PPO | Admitting: Nurse Practitioner

## 2022-03-11 VITALS — BP 110/72 | HR 67 | Temp 97.0°F | Ht 60.0 in | Wt 227.6 lb

## 2022-03-11 DIAGNOSIS — K2100 Gastro-esophageal reflux disease with esophagitis, without bleeding: Secondary | ICD-10-CM | POA: Diagnosis not present

## 2022-03-11 DIAGNOSIS — Z79899 Other long term (current) drug therapy: Secondary | ICD-10-CM | POA: Diagnosis not present

## 2022-03-11 DIAGNOSIS — R7309 Other abnormal glucose: Secondary | ICD-10-CM | POA: Diagnosis not present

## 2022-03-11 DIAGNOSIS — E559 Vitamin D deficiency, unspecified: Secondary | ICD-10-CM

## 2022-03-11 DIAGNOSIS — E039 Hypothyroidism, unspecified: Secondary | ICD-10-CM

## 2022-03-11 DIAGNOSIS — I10 Essential (primary) hypertension: Secondary | ICD-10-CM | POA: Diagnosis not present

## 2022-03-11 DIAGNOSIS — E210 Primary hyperparathyroidism: Secondary | ICD-10-CM | POA: Diagnosis not present

## 2022-03-11 DIAGNOSIS — E785 Hyperlipidemia, unspecified: Secondary | ICD-10-CM | POA: Diagnosis not present

## 2022-03-11 NOTE — Patient Instructions (Signed)
Topiramate Extended-Release Capsules What is this medication? TOPIRAMATE (toe PYRE a mate) prevents and controls seizures in people with epilepsy. It may also be used to prevent migraine headaches. It works by calming overactive nerves in your body. This medicine may be used for other purposes; ask your health care provider or pharmacist if you have questions. COMMON BRAND NAME(S): Qudexy XR, Trokendi XR What should I tell my care team before I take this medication? They need to know if you have any of these conditions: Bleeding disorder Frequently drink alcohol Kidney disease Lung disease Suicidal thoughts, plans, or attempt by you or a family member An unusual or allergic reaction to topiramate, other medications, foods, dyes, or preservatives Pregnant or trying to get pregnant Breast-feeding How should I use this medication? Take this medication by mouth with water. Take it as directed on the prescription label at the same time every day. Do not cut, crush or chew this medication. Swallow the capsules whole. You can take it with or without food. If it upsets your stomach, take it with food. Keep taking it unless your care team tells you to stop. A special MedGuide will be given to you by the pharmacist with each prescription and refill. Be sure to read this information carefully each time. Talk to your care team about the use of this medication in children. While it may be prescribed for children as young as 6 years for selected conditions, precautions do apply. Overdosage: If you think you have taken too much of this medicine contact a poison control center or emergency room at once. NOTE: This medicine is only for you. Do not share this medicine with others. What if I miss a dose? If you miss a dose, take it as soon as you can. If it is almost time for your next dose, take only that dose. Do not take double or extra doses. What may interact with this  medication? Acetazolamide Alcohol Antihistamines for allergy, cough, and cold Aspirin and aspirin-like medications Atropine Certain medications for anxiety or sleep Certain medications for bladder problems, such as oxybutynin, tolterodine Certain medications for depression, such as amitriptyline, fluoxetine, sertraline Certain medications for Parkinson disease, such as benztropine, trihexyphenidyl Certain medications for seizures, such as carbamazepine, lamotrigine, phenobarbital, phenytoin, primidone, valproic acid, zonisamide Certain medications for stomach problems, such as dicyclomine, hyoscyamine Certain medications for travel sickness, such as scopolamine Certain medications that treat or prevent blood clots, such as warfarin, enoxaparin, dalteparin, apixaban, dabigatran, rivaroxaban Digoxin Diltiazem Estrogen and progestin hormones General anesthetics, such as halothane, isoflurane, methoxyflurane, propofol Glyburide Hydrochlorothiazide Ipratropium Lithium Medications that relax muscles Metformin NSAIDs, medications for pain and inflammation, such as ibuprofen or naproxen Opioid medications for pain Phenothiazines, such as chlorpromazine, mesoridazine, prochlorperazine, thioridazine Pioglitazone This list may not describe all possible interactions. Give your health care provider a list of all the medicines, herbs, non-prescription drugs, or dietary supplements you use. Also tell them if you smoke, drink alcohol, or use illegal drugs. Some items may interact with your medicine. What should I watch for while using this medication? Visit your care team for regular checks on your progress. Tell your care team if your symptoms do not start to get better or if they get worse. Do not suddenly stop taking this medication. You may develop a severe reaction. Your care team will tell you how much medication to take. If your care team wants you to stop the medication, the dose may be slowly  lowered over time to avoid any side  effects. Wear a medical ID bracelet or chain. Carry a card that describes your condition. List the medications and doses you take on the card. This medication may affect your coordination, reaction time, or judgment. Do not drive or operate machinery until you know how this medication affects you. Sit up or stand slowly to reduce the risk of dizzy or fainting spells. Drinking alcohol with this medication can increase the risk of these side effects. This medication may cause serious skin reactions. They can happen weeks to months after starting the medication. Contact your care team right away if you notice fevers or flu-like symptoms with a rash. The rash may be red or purple and then turn into blisters or peeling of the skin. You may also notice a red rash with swelling of the face, lips, or lymph nodes in your neck or under your arms. This medication may cause thoughts of suicide or depression. This includes sudden changes in mood, behaviors, or thoughts. These changes can happen at any time but are more common in the beginning of treatment or after a change in dose. Call your care team right away if you experience these thoughts or worsening depression. This medication may slow your child's growth if it is taken for a long time at high doses. Your child's care team will monitor your child's growth. Using this medication for a long time may weaken your bones. The risk of bone fractures may be increased. Talk to your care team about your bone health. Discuss this medication with your care team if you may be pregnant. Serious birth defects can occur if you take this medication during pregnancy. There are benefits and risks to taking medications during pregnancy. Your care team can help you find the option that works for you. Contraception is recommended while taking this medication. Estrogen and progestin hormones may not work as well while you are taking this medication.  Your care team can help you find the option that works for you. Talk to your care team before breastfeeding. Changes to your treatment plan may be needed. What side effects may I notice from receiving this medication? Side effects that you should report to your care team as soon as possible: Allergic reactions--skin rash, itching, hives, swelling of the face, lips, tongue, or throat High acid level--trouble breathing, unusual weakness or fatigue, confusion, headache, fast or irregular heartbeat, nausea, vomiting High ammonia level--unusual weakness or fatigue, confusion, loss of appetite, nausea, vomiting, seizures Fever that does not go away, decrease in sweat Kidney stones--blood in the urine, pain or trouble passing urine, pain in the lower back or sides Redness, blistering, peeling or loosening of the skin, including inside the mouth Sudden eye pain or change in vision, such as blurry vision, seeing halos around lights, vision loss Thoughts of suicide or self-harm, worsening mood, feelings of depression Side effects that usually do not require medical attention (report to your care team if they continue or are bothersome): Burning or tingling sensation in hands or feet Difficulty with paying attention, memory, or speech Dizziness Drowsiness Fatigue Loss of appetite with weight loss Slow or sluggish movements of the body This list may not describe all possible side effects. Call your doctor for medical advice about side effects. You may report side effects to FDA at 1-800-FDA-1088. Where should I keep my medication? Keep out of the reach of children and pets. Store at room temperature between 20 and 25 degrees C (68 and 77 degrees F). Protect from light and moisture.  Keep the container tightly closed. Get rid of any unused medication after the expiration date. To get rid of medications that are no longer needed or have expired: Take the medication to a medication take-back program. Check  with your pharmacy or law enforcement to find a location. If you cannot return the medication, check the label or package insert to see if the medication should be thrown out in the garbage or flushed down the toilet. If you are not sure, ask your care team. If it is safe to put it in the trash, empty the medication out of the container. Mix the medication with cat litter, dirt, coffee grounds, or other unwanted substance. Seal the mixture in a bag or container. Put it in the trash. NOTE: This sheet is a summary. It may not cover all possible information. If you have questions about this medicine, talk to your doctor, pharmacist, or health care provider.  2023 [employer]/Gold Standard (2020-05-07 00:00:00)

## 2022-03-11 NOTE — Progress Notes (Signed)
Follow Up  Assessment and Plan:  Essential hypertension Discussed DASH (Dietary Approaches to Stop Hypertension) DASH diet is lower in sodium than a typical American diet. Cut back on foods that are high in saturated fat, cholesterol, and trans fats. Eat more whole-grain foods, fish, poultry, and nuts Remain active and exercise as tolerated daily.  Monitor BP at home-Call if greater than 130/80.  Check CMP/CBC   Hypothyroidism, unspecified hypothyroidism type TSH slightly below level last check 08/2021 (0.38) Asymptomatic. Continue Levothyroxine. Reminded to take on an empty stomach 30-62mins before food.  Stop any Biotin Supplement 48-72 hours before next TSH level to reduce the risk of falsely low TSH levels. Continue to monitor.     Morbid obesity, unspecified obesity type ([place]) - BMI 46 Unable to tolerate injectable GLP-1 Discussed starting Topamax, currently on Phentermine - she will review SE. Discussed appropriate BMI Goal of losing 1 lb per month. Diet modification. Physical activity. Encouraged/praised to build confidence.   High cholesterol Continue Atorvastatin Discussed lifestyle modifications. Recommended diet heavy in fruits and veggies, omega 3's. Decrease consumption of animal meats, cheeses, and dairy products. Remain active and exercise as tolerated. Continue to monitor. Check lipids/TSH    Medication management All medications discussed and reviewed in full. All questions and concerns regarding medications addressed.    Vitamin D deficiency Continue supplement with goal between 60-100. Discussed benefits of taking vitamin d supplement including inc are in immune support, bone health, decrease in breast cancer.  Monitor levels   Gastroesophageal reflux disease with esophagitis Continue Nexium No suspected reflux complications ([place]/stricture). Lifestyle modification:  wt loss, avoid meals 2-3h before bedtime. Consider eliminating food  triggers:  chocolate, caffeine, EtOH, acid/spicy food.   Primary hyperparathyroidism (HCC) Mild, avoiding calcium supplements/tums No hx of renal stones Screen for osteoporosis - DEXA at [facility]  Monitor   Abnormal glucose SE to Semaglutide Continue Metformin Education: Reviewed 'ABCs' of diabetes management  Discussed goals to be met and/or maintained include A1C (<7) Blood pressure (<130/80) Cholesterol (LDL <70) Continue Eye Exam yearly  Continue Dental Exam Q6 mo Discussed dietary recommendations Discussed Physical Activity recommendations Foot exam UTD Check A1C  Orders Placed This Encounter  Procedures   CBC with Differential/Platelet   COMPLETE METABOLIC PANEL WITH GFR   Lipid panel   TSH   Hemoglobin A1c   VITAMIN D 25 Hydroxy (Vit-D Deficiency, Fractures)    Notify office for further evaluation and treatment, questions or concerns if any reported s/s fail to improve.   The patient was advised to call back or seek an in-person evaluation if any symptoms worsen or if the condition fails to improve as anticipated.   Further disposition pending results of labs. Discussed med's effects and SE's.    I discussed the assessment and treatment plan with the patient. The patient was provided an opportunity to ask questions and all were answered. The patient agreed with the plan and demonstrated an understanding of the instructions.  Discussed med's effects and SE's. Screening labs and tests as requested with regular follow-up as recommended.  I provided 25 minutes of face-to-face time during this encounter including counseling, chart review, and critical decision making was preformed.  Future Appointments  Date Time Provider [place]  08/02/2022 10:00 AM Alycia Rossetti, NP [facility] None    HPI  62 y.o. female  presents for a general follow up. She has Bilateral leg and foot pain; Morbid obesity with BMI of 40.0-44.9, adult ([place]); Hypertension; GERD  (gastroesophageal reflux disease); Hypothyroidism;  Hyperlipidemia; Chronic cough; Abnormal glucose; Hot flashes, menopausal; Vitamin D deficiency; Medication management; Arthritis of carpometacarpal (CMC) joint of left thumb; Primary hyperparathyroidism ([place]); and Complete right bundle branch block (RBBB) on their problem list.  Overall she reports doing well today.  She has no new concerns.    BMI is Body mass index is 44.45 kg/m., she has not been working on diet and exercise due to holidays and winter weather.  She also works long shifts, 3rd shift, 10 hours. Grabs whatever she can to eat.  She is continuing Phentermine.  Interested in starting Topamax as her husband is taking and losing weight.  Wt Readings from Last 3 Encounters:  03/11/22 227 lb 9.6 oz (103.2 kg)  08/24/21 221 lb (100.2 kg)  07/31/21 227 lb 12.8 oz (103.3 kg)    Her blood pressure has been controlled at home, today their BP is BP: 110/72 She does not workout due to time restraints.  She denies chest pain, shortness of breath, dizziness.   She is on cholesterol medication, lipitor 10 daily and denies myalgias. Her cholesterol is at goal. The cholesterol last visit was:   Lab Results  Component Value Date   CHOL 155 07/31/2021   HDL 58 07/31/2021   LDLCALC 79 07/31/2021   TRIG 95 07/31/2021   CHOLHDL 2.7 07/31/2021    She has been working on diet and exercise for prediabetes, she is on metoformin 4 pill a day, and denies paresthesia of the feet, polydipsia, polyuria and visual disturbances. Last A1C in the office was:  Lab Results  Component Value Date   HGBA1C 5.6 07/31/2021   Patient has been off of vitamin D supplement due to high calcium, previously on 1000 IU Lab Results  Component Value Date   VD25OH 39 07/31/2021    She is on thyroid medication. Her medication was not changed last visit.  137 mcg daily.  Lab Results  Component Value Date   TSH 0.38 (L) 07/31/2021   She has mild hypercalcemia, felt  most likely mild hyperparathyroid. Had 24 h urine calcium of 142, no familial calcium. No hx of renal stones, hasn't had DEXA screening.  Lab Results  Component Value Date   PTH 42 03/03/2021   CALCIUM 10.7 (H) 07/31/2021   CAION 1.21 06/15/2007     Current Medications:   Current Outpatient Medications (Endocrine & Metabolic):    metFORMIN (GLUCOPHAGE-XR) 500 MG 24 hr tablet, Take 2 tablets (1,000 mg total) by mouth 2 (two) times daily.   SYNTHROID 137 MCG tablet, TAKE 1 TAB BY MOUTH DAILY ON AN EMPTY STOMACH WITH ONLY WATER FOR 30 MINUTES & NO ANTACID MEDS, CALCIUM, MAGNESIUM FOR 4 HOURS & AVOID BIOTIN  Current Outpatient Medications (Cardiovascular):    aMILoride (MIDAMOR) 5 MG tablet, Take 1 tablet (5 mg total) by mouth daily.   atorvastatin (LIPITOR) 10 MG tablet, Take 1 tablet (10 mg total) by mouth daily for Cholesterol   candesartan (ATACAND) 32 MG tablet, Take 1 tablet (32 mg total) by mouth nightly for blood pressure   EPINEPHRINE 0.3 mg/0.3 mL IJ SOAJ injection, USE AS DIRECTED   propranolol (INDERAL) 80 MG tablet, Take 1 tablet (80 mg total) by mouth in the morning and in the evening for blood pressure.  Current Outpatient Medications (Respiratory):    cetirizine (ZYRTEC) 10 MG tablet, Take 10 mg by mouth daily.   fluticasone (FLONASE) 50 MCG/ACT nasal spray, Place 1 spray into both nostrils daily.  Current Outpatient Medications (Analgesics):  aspirin EC 81 MG tablet, Take 81 mg by mouth daily. Swallow whole.   traMADol (ULTRAM) 50 MG tablet, Take 1 tablet (50 mg total) by mouth every 12 (twelve) hours as needed (cough).  Current Outpatient Medications (Hematological):    cyanocobalamin 2000 MCG tablet, Take 2,000 mcg by mouth daily.  Current Outpatient Medications (Other):    Ascorbic Acid (VITAMIN C) 1000 MG tablet, Take 1,000 mg by mouth daily.   buPROPion (WELLBUTRIN XL) 150 MG 24 hr tablet, Take 1 tablet (150 mg total) by mouth every morning.   Cholecalciferol  (VITAMIN D-3) 5000 UNITS TABS, Take 5,000 Units by mouth daily.   escitalopram (LEXAPRO) 20 MG tablet, Take 1 tablet (20 mg total) by mouth daily for mood.   esomeprazole (NEXIUM) 40 MG capsule, Take 1 capsule (40 mg total) by mouth daily to prevent heartburn & indigestion   fluconazole (DIFLUCAN) 150 MG tablet, Take 1 tablet (150 mg total) by mouth daily.   Glucosamine-Chondroitin (GLUCOSAMINE CHONDR COMPLEX PO), Take 1,500 mg by mouth. 1500mg /1200mg   Actual dose   ketoconazole (NIZORAL) 2 % cream, Apply 1 application topically 2 (two) times daily.   metroNIDAZOLE (METROCREAM) 0.75 % cream, Apply topically 2 (two) times daily.   nystatin (MYCOSTATIN/NYSTOP) powder, Apply daily after a shower as needed   ondansetron (ZOFRAN) 8 MG tablet, Take 1 tablet (8 mg total) by mouth up to 3 (three) times daily as needed for nausea/vomiting.   oxybutynin (DITROPAN) 5 MG tablet, Take 1 tablet (5 mg total) by mouth 2 (two) times daily as needed (sweating).   phentermine (ADIPEX-P) 37.5 MG tablet, Take 1 tablet (37.5 mg total) by mouth daily before breakfast.   triamcinolone cream (KENALOG) 0.5 %, Apply 1 application topically 2 (two) times daily.  Health Maintenance:   Immunization History  Administered Date(s) Administered   DTaP 12/07/2012   Influenza-Unspecified 11/22/2012, 11/12/2014, 11/17/2017, 11/21/2020   Pneumococcal Polysaccharide-23 02/22/2009   Td 02/23/2000   Tdap 12/07/2012   Zoster, Live 10/29/2010   Health Maintenance  Topic Date Due   COVID-19 Vaccine (1) Never done   INFLUENZA VACCINE  09/22/2021   MAMMOGRAM  12/30/2021   DTaP/Tdap/Td (4 - Td or Tdap) 12/08/2022   PAP SMEAR-Modifier  02/25/2023   COLONOSCOPY (Pts 45-86yrs Insurance coverage will need to be confirmed)  05/06/2024   Hepatitis C Screening  Completed   HIV Screening  Completed   HPV VACCINES  Aged Out   Zoster Vaccines- Shingrix  Discontinued    Patient Care Team: 05/08/2024, MD as PCP - General (Internal  Medicine) Lucky Cowboy, MD (Inactive) as Consulting Physician (Gastroenterology) Dorena Cookey, MD as Consulting Physician (Endocrinology)  Medical History:  Past Medical History:  Diagnosis Date   Biliary dyskinesia 12/07/2011   GERD (gastroesophageal reflux disease)    High cholesterol    Hypertension    Hypothyroidism    Multinodular goiter    Allergies Allergies  Allergen Reactions   Avelox [Moxifloxacin Hcl In Nacl] Hives    Thrush, throat might have closed    Hydrochlorothiazide Itching    On hands and feet   Other Itching    All "Cillins", hives alao   Penicillins     Hives   Prednisone     Chest pains   Zostavax [Zoster Vaccine Live]    Codeine Hives, Itching and Rash   Levothyroxine Rash    Only to generic. Not to brand synthroid.    Semaglutide Rash   Sulfa Antibiotics Itching and Rash    Hands  and feet itch and turn red    SURGICAL HISTORY She  has a past surgical history that includes Carpal tunnel release (yrs ago); Knee surgery (arthroscopic, 3 yrs ago); Tonsillectomy (age 12); Abdominal hysterectomy (8 yrs ago); Tubal ligation (yrs ago); radioactive iodine to thyroid' (march 2013); Cholecystectomy (12/28/2011); Cataract extraction w/PHACO (Right, 05/04/2021); and Cataract extraction w/PHACO (Left, 05/18/2021). FAMILY HISTORY Her family history includes AAA (abdominal aortic aneurysm) in her father; Alcoholism in her sister; Alzheimer's disease in her mother; Breast cancer (age of onset: 79) in her mother; CAD in her brother; Heart attack in her father and paternal grandmother; Heart attack (age of onset: 58) in her brother; Stroke in her father and mother; Suicidality in her maternal grandfather and maternal grandmother. SOCIAL HISTORY She  reports that she has never smoked. She has never used smokeless tobacco. She reports that she does not drink alcohol and does not use drugs.  Review of Systems  Constitutional: Negative.  Negative for chills, fever,  malaise/fatigue and weight loss.  HENT: Negative.  Negative for hearing loss and tinnitus.   Eyes: Negative.  Negative for blurred vision and double vision.  Respiratory: Negative.  Negative for cough, shortness of breath and wheezing.   Cardiovascular: Negative.  Negative for chest pain, palpitations, orthopnea, claudication and leg swelling.  Gastrointestinal: Negative.  Negative for abdominal pain, blood in stool, constipation, diarrhea, heartburn, melena, nausea and vomiting.  Genitourinary: Negative.  Negative for dysuria, flank pain, frequency, hematuria and urgency.  Musculoskeletal:  Positive for joint pain. Negative for back pain, falls, myalgias and neck pain.  Skin:  Negative for rash.  Neurological: Negative.  Negative for dizziness, tingling, sensory change, weakness and headaches.  Endo/Heme/Allergies: Negative.  Negative for polydipsia.  Psychiatric/Behavioral: Negative.  Negative for depression. The patient does not have insomnia.   All other systems reviewed and are negative.    Physical Exam: Estimated body mass index is 44.45 kg/m as calculated from the following:   Height as of this encounter: 5' (1.524 m).   Weight as of this encounter: 227 lb 9.6 oz (103.2 kg). BP 110/72   Pulse 67   Temp (!) 97 F (36.1 C)   Ht 5' (1.524 m)   Wt 227 lb 9.6 oz (103.2 kg)   SpO2 99%   BMI 44.45 kg/m  General Appearance: Well nourished, in no apparent distress.  Eyes: PERRLA, EOMs, conjunctiva no swelling or erythema Sinuses: No Frontal/maxillary tenderness  ENT/Mouth: Ext aud canals clear, normal light reflex with TMs without erythema, bulging. Good dentition. No erythema, swelling, or exudate on post pharynx. Tonsils not swollen or erythematous. Hearing normal.  Neck: Supple, thyroid normal. No bruits  Respiratory: Respiratory effort normal, BS equal bilaterally without rales, rhonchi, wheezing or stridor.  Cardio: RRR without murmurs, rubs or gallops. Brisk peripheral pulses  without edema.  Chest: symmetric, with normal excursions and percussion.  Breasts: Declined today Abdomen: Soft, nontender, morbidly obese limiting exam, no guarding, rebound, hernias, masses, or organomegaly. .  Lymphatics: Non tender without lymphadenopathy.  Genitourinary: defer Musculoskeletal: Limited exam by body habitus, symmetrical ROM all peripheral extremities,5/5 strength, and normal gait.  Skin: Warm, dry without rashes, lesions, ecchymosis. Neuro: Cranial nerves intact, reflexes equal bilaterally. Normal muscle tone, no cerebellar symptoms. Sensation intact.  Psych: Awake and oriented X 3, normal affect, Insight and Judgment appropriate.     Darrol Jump, NP-C 9:19 AM [facility]

## 2022-03-12 LAB — COMPLETE METABOLIC PANEL WITH GFR
AG Ratio: 1.9 (calc) (ref 1.0–2.5)
ALT: 13 U/L (ref 6–29)
AST: 12 U/L (ref 10–35)
Albumin: 4.4 g/dL (ref 3.6–5.1)
Alkaline phosphatase (APISO): 84 U/L (ref 37–153)
BUN: 24 mg/dL (ref 7–25)
CO2: 23 mmol/L (ref 20–32)
Calcium: 11 mg/dL — ABNORMAL HIGH (ref 8.6–10.4)
Chloride: 103 mmol/L (ref 98–110)
Creat: 1.02 mg/dL (ref 0.50–1.05)
Globulin: 2.3 g/dL (calc) (ref 1.9–3.7)
Glucose, Bld: 86 mg/dL (ref 65–99)
Potassium: 5.2 mmol/L (ref 3.5–5.3)
Sodium: 137 mmol/L (ref 135–146)
Total Bilirubin: 0.4 mg/dL (ref 0.2–1.2)
Total Protein: 6.7 g/dL (ref 6.1–8.1)
eGFR: 62 mL/min/{1.73_m2} (ref >=60)

## 2022-03-12 LAB — LIPID PANEL
Cholesterol: 128 mg/dL (ref <200)
HDL: 61 mg/dL (ref >=50)
LDL Cholesterol (Calc): 50 mg/dL (calc)
Non-HDL Cholesterol (Calc): 67 mg/dL (calc) (ref <130)
Total CHOL/HDL Ratio: 2.1 (calc) (ref <5.0)
Triglycerides: 83 mg/dL (ref <150)

## 2022-03-12 LAB — CBC WITH DIFFERENTIAL/PLATELET
Absolute Monocytes: 631 cells/uL (ref 200–950)
Basophils Absolute: 118 cells/uL (ref 0–200)
Basophils Relative: 1.1 %
Eosinophils Absolute: 428 cells/uL (ref 15–500)
Eosinophils Relative: 4 %
HCT: 37.9 % (ref 35.0–45.0)
Hemoglobin: 12.5 g/dL (ref 11.7–15.5)
Lymphs Abs: 3991 cells/uL — ABNORMAL HIGH (ref 850–3900)
MCH: 28.7 pg (ref 27.0–33.0)
MCHC: 33 g/dL (ref 32.0–36.0)
MCV: 87.1 fL (ref 80.0–100.0)
MPV: 10.8 fL (ref 7.5–12.5)
Monocytes Relative: 5.9 %
Neutro Abs: 5532 cells/uL (ref 1500–7800)
Neutrophils Relative %: 51.7 %
Platelets: 358 10*3/uL (ref 140–400)
RBC: 4.35 10*6/uL (ref 3.80–5.10)
RDW: 12.2 % (ref 11.0–15.0)
Total Lymphocyte: 37.3 %
WBC: 10.7 10*3/uL (ref 3.8–10.8)

## 2022-03-12 LAB — TSH: TSH: 0.27 mIU/L — ABNORMAL LOW (ref 0.40–4.50)

## 2022-03-12 LAB — VITAMIN D 25 HYDROXY (VIT D DEFICIENCY, FRACTURES): Vit D, 25-Hydroxy: 33 ng/mL (ref 30–100)

## 2022-03-12 LAB — HEMOGLOBIN A1C
Hgb A1c MFr Bld: 5.8 % of total Hgb — ABNORMAL HIGH (ref <5.7)
Mean Plasma Glucose: 120 mg/dL
eAG (mmol/L): 6.6 mmol/L

## 2022-03-15 ENCOUNTER — Other Ambulatory Visit: Payer: Self-pay | Admitting: Nurse Practitioner

## 2022-03-15 DIAGNOSIS — I10 Essential (primary) hypertension: Secondary | ICD-10-CM

## 2022-03-16 ENCOUNTER — Encounter: Payer: Self-pay | Admitting: Internal Medicine

## 2022-03-16 ENCOUNTER — Other Ambulatory Visit: Payer: Self-pay

## 2022-03-16 ENCOUNTER — Other Ambulatory Visit (HOSPITAL_COMMUNITY): Payer: Self-pay

## 2022-03-16 MED ORDER — AMILORIDE HCL 5 MG PO TABS
5.0000 mg | ORAL_TABLET | Freq: Every day | ORAL | 1 refills | Status: DC
  Filled 2022-03-16: qty 90, 90d supply, fill #0
  Filled 2022-06-14: qty 90, 90d supply, fill #1

## 2022-04-05 ENCOUNTER — Other Ambulatory Visit: Payer: Self-pay

## 2022-04-05 ENCOUNTER — Other Ambulatory Visit: Payer: Self-pay | Admitting: Nurse Practitioner

## 2022-04-05 ENCOUNTER — Other Ambulatory Visit (HOSPITAL_COMMUNITY): Payer: Self-pay

## 2022-04-05 MED ORDER — PHENTERMINE HCL 37.5 MG PO TABS
37.5000 mg | ORAL_TABLET | Freq: Every day | ORAL | 2 refills | Status: DC
  Filled 2022-04-05 – 2022-04-14 (×3): qty 30, 30d supply, fill #0
  Filled 2022-05-10 – 2022-05-12 (×2): qty 30, 30d supply, fill #1
  Filled 2022-06-14: qty 30, 30d supply, fill #2

## 2022-04-05 MED ORDER — CANDESARTAN CILEXETIL 32 MG PO TABS
32.0000 mg | ORAL_TABLET | Freq: Every evening | ORAL | 1 refills | Status: DC
  Filled 2022-04-05: qty 90, 90d supply, fill #0
  Filled 2022-11-09: qty 90, 90d supply, fill #1

## 2022-04-08 ENCOUNTER — Other Ambulatory Visit (HOSPITAL_COMMUNITY): Payer: Self-pay

## 2022-04-14 ENCOUNTER — Other Ambulatory Visit (HOSPITAL_COMMUNITY): Payer: Self-pay

## 2022-04-15 DIAGNOSIS — H52223 Regular astigmatism, bilateral: Secondary | ICD-10-CM | POA: Diagnosis not present

## 2022-05-10 ENCOUNTER — Other Ambulatory Visit (HOSPITAL_COMMUNITY): Payer: Self-pay

## 2022-05-10 ENCOUNTER — Other Ambulatory Visit: Payer: Self-pay | Admitting: Nurse Practitioner

## 2022-05-10 MED ORDER — PROPRANOLOL HCL 80 MG PO TABS
80.0000 mg | ORAL_TABLET | Freq: Two times a day (BID) | ORAL | 1 refills | Status: DC
  Filled 2022-05-10 (×2): qty 180, 90d supply, fill #0
  Filled 2022-08-14: qty 180, 90d supply, fill #1

## 2022-05-10 MED ORDER — ESOMEPRAZOLE MAGNESIUM 40 MG PO CPDR
40.0000 mg | DELAYED_RELEASE_CAPSULE | Freq: Every day | ORAL | 1 refills | Status: DC
  Filled 2022-05-10 (×2): qty 90, 90d supply, fill #0
  Filled 2022-08-14: qty 90, 90d supply, fill #1

## 2022-05-11 ENCOUNTER — Other Ambulatory Visit: Payer: Self-pay

## 2022-05-11 ENCOUNTER — Other Ambulatory Visit (HOSPITAL_COMMUNITY): Payer: Self-pay

## 2022-05-11 ENCOUNTER — Other Ambulatory Visit: Payer: Self-pay | Admitting: Nurse Practitioner

## 2022-05-11 MED ORDER — SYNTHROID 137 MCG PO TABS
137.0000 ug | ORAL_TABLET | Freq: Every day | ORAL | 1 refills | Status: DC
  Filled 2022-05-11: qty 90, 90d supply, fill #0
  Filled 2022-05-13: qty 90, 90d supply, fill #1

## 2022-05-12 ENCOUNTER — Other Ambulatory Visit: Payer: Self-pay

## 2022-05-13 ENCOUNTER — Other Ambulatory Visit (HOSPITAL_COMMUNITY): Payer: Self-pay

## 2022-05-14 ENCOUNTER — Other Ambulatory Visit: Payer: Self-pay

## 2022-05-14 ENCOUNTER — Other Ambulatory Visit (HOSPITAL_COMMUNITY): Payer: Self-pay

## 2022-05-19 ENCOUNTER — Other Ambulatory Visit (HOSPITAL_COMMUNITY): Payer: Self-pay

## 2022-05-26 ENCOUNTER — Other Ambulatory Visit (HOSPITAL_COMMUNITY): Payer: Self-pay

## 2022-06-14 ENCOUNTER — Other Ambulatory Visit (HOSPITAL_COMMUNITY): Payer: Self-pay

## 2022-06-14 ENCOUNTER — Other Ambulatory Visit: Payer: Self-pay

## 2022-06-17 ENCOUNTER — Other Ambulatory Visit (HOSPITAL_COMMUNITY): Payer: Self-pay

## 2022-06-17 ENCOUNTER — Ambulatory Visit: Payer: Commercial Managed Care - PPO | Admitting: Nurse Practitioner

## 2022-07-14 ENCOUNTER — Other Ambulatory Visit: Payer: Self-pay | Admitting: Nurse Practitioner

## 2022-07-14 ENCOUNTER — Other Ambulatory Visit: Payer: Self-pay

## 2022-07-14 ENCOUNTER — Other Ambulatory Visit (HOSPITAL_COMMUNITY): Payer: Self-pay

## 2022-07-14 MED ORDER — PHENTERMINE HCL 37.5 MG PO TABS
37.5000 mg | ORAL_TABLET | Freq: Every day | ORAL | 2 refills | Status: DC
  Filled 2022-07-14: qty 30, 30d supply, fill #0
  Filled 2022-08-14: qty 30, 30d supply, fill #1
  Filled 2022-09-22: qty 30, 30d supply, fill #2

## 2022-07-16 ENCOUNTER — Other Ambulatory Visit: Payer: Self-pay

## 2022-07-16 MED ORDER — SYNTHROID 137 MCG PO TABS: 137.0000 ug | ORAL_TABLET | Freq: Every day | ORAL | 1 refills | Status: DC

## 2022-07-27 ENCOUNTER — Other Ambulatory Visit (HOSPITAL_COMMUNITY): Payer: Self-pay

## 2022-07-27 ENCOUNTER — Other Ambulatory Visit: Payer: Self-pay | Admitting: Nurse Practitioner

## 2022-07-27 DIAGNOSIS — R61 Generalized hyperhidrosis: Secondary | ICD-10-CM

## 2022-07-27 MED ORDER — OXYBUTYNIN CHLORIDE 5 MG PO TABS
5.0000 mg | ORAL_TABLET | Freq: Two times a day (BID) | ORAL | 0 refills | Status: DC | PRN
Start: 2022-07-27 — End: 2023-06-07
  Filled 2022-07-27: qty 60, 30d supply, fill #0

## 2022-08-02 ENCOUNTER — Encounter: Payer: Commercial Managed Care - PPO | Admitting: Nurse Practitioner

## 2022-08-03 ENCOUNTER — Other Ambulatory Visit: Payer: Self-pay | Admitting: Nurse Practitioner

## 2022-08-03 ENCOUNTER — Other Ambulatory Visit: Payer: Self-pay | Admitting: Internal Medicine

## 2022-08-04 ENCOUNTER — Other Ambulatory Visit (HOSPITAL_COMMUNITY): Payer: Self-pay

## 2022-08-04 MED ORDER — ESCITALOPRAM OXALATE 20 MG PO TABS
20.0000 mg | ORAL_TABLET | Freq: Every day | ORAL | 3 refills | Status: DC
  Filled 2022-08-04: qty 90, 90d supply, fill #0
  Filled 2022-11-09: qty 90, 90d supply, fill #1
  Filled 2023-02-07: qty 90, 90d supply, fill #2
  Filled 2023-05-27: qty 90, 90d supply, fill #3

## 2022-08-04 MED ORDER — ATORVASTATIN CALCIUM 10 MG PO TABS
10.0000 mg | ORAL_TABLET | Freq: Every day | ORAL | 3 refills | Status: DC
  Filled 2022-08-04: qty 90, 90d supply, fill #0
  Filled 2022-11-09: qty 90, 90d supply, fill #1
  Filled 2023-02-07: qty 90, 90d supply, fill #2
  Filled 2023-04-30: qty 90, 90d supply, fill #3

## 2022-08-13 ENCOUNTER — Ambulatory Visit (INDEPENDENT_AMBULATORY_CARE_PROVIDER_SITE_OTHER): Payer: Commercial Managed Care - PPO | Admitting: Nurse Practitioner

## 2022-08-13 ENCOUNTER — Encounter: Payer: Self-pay | Admitting: Nurse Practitioner

## 2022-08-13 VITALS — BP 124/78 | HR 85 | Temp 97.9°F | Resp 16 | Ht 60.0 in | Wt 232.0 lb

## 2022-08-13 DIAGNOSIS — Z79899 Other long term (current) drug therapy: Secondary | ICD-10-CM

## 2022-08-13 DIAGNOSIS — I4510 Unspecified right bundle-branch block: Secondary | ICD-10-CM

## 2022-08-13 DIAGNOSIS — E785 Hyperlipidemia, unspecified: Secondary | ICD-10-CM

## 2022-08-13 DIAGNOSIS — I10 Essential (primary) hypertension: Secondary | ICD-10-CM

## 2022-08-13 DIAGNOSIS — N951 Menopausal and female climacteric states: Secondary | ICD-10-CM

## 2022-08-13 DIAGNOSIS — Z0001 Encounter for general adult medical examination with abnormal findings: Secondary | ICD-10-CM | POA: Diagnosis not present

## 2022-08-13 DIAGNOSIS — Z1389 Encounter for screening for other disorder: Secondary | ICD-10-CM | POA: Diagnosis not present

## 2022-08-13 DIAGNOSIS — E559 Vitamin D deficiency, unspecified: Secondary | ICD-10-CM | POA: Diagnosis not present

## 2022-08-13 DIAGNOSIS — K2100 Gastro-esophageal reflux disease with esophagitis, without bleeding: Secondary | ICD-10-CM

## 2022-08-13 DIAGNOSIS — Z131 Encounter for screening for diabetes mellitus: Secondary | ICD-10-CM | POA: Diagnosis not present

## 2022-08-13 DIAGNOSIS — Z136 Encounter for screening for cardiovascular disorders: Secondary | ICD-10-CM

## 2022-08-13 DIAGNOSIS — Z0000 Encounter for general adult medical examination without abnormal findings: Secondary | ICD-10-CM

## 2022-08-13 DIAGNOSIS — E210 Primary hyperparathyroidism: Secondary | ICD-10-CM

## 2022-08-13 DIAGNOSIS — E039 Hypothyroidism, unspecified: Secondary | ICD-10-CM | POA: Diagnosis not present

## 2022-08-13 LAB — CBC WITH DIFFERENTIAL/PLATELET
Absolute Monocytes: 577 cells/uL (ref 200–950)
HCT: 36.9 % (ref 35.0–45.0)

## 2022-08-13 LAB — COMPLETE METABOLIC PANEL WITH GFR
Glucose, Bld: 84 mg/dL (ref 65–99)
Sodium: 138 mmol/L (ref 135–146)

## 2022-08-13 LAB — LIPID PANEL
Cholesterol: 160 mg/dL (ref <200)
Total CHOL/HDL Ratio: 2.5 (calc) (ref <5.0)

## 2022-08-13 NOTE — Progress Notes (Signed)
Complete Physical  Assessment and Plan:  Encounter for Annual Physical Exam with abnormal findings Due annually  Health Maintenance reviewed Healthy lifestyle reviewed and goals set  Primary hypertension Discussed DASH (Dietary Approaches to Stop Hypertension) DASH diet is lower in sodium than a typical American diet. Cut back on foods that are high in saturated fat, cholesterol, and trans fats. Eat more whole-grain foods, fish, poultry, and nuts Remain active and exercise as tolerated daily.  Monitor BP at home-Call if greater than 130/80.  Check CMP/CBC  Hypothyroidism Below normal range.  Adjust medications based on TSH level - pending. Reminded to take on an empty stomach 30-21mins before food.  Stop any Biotin Supplement 48-72 hours before next TSH level to reduce the risk of falsely low TSH levels. Continue to monitor.     Morbid obesity, unspecified obesity type (HCC) - BMI 46 Continue Phentermine Discussed adding Topamax Adjust TSH levels to WNL. Discussed appropriate BMI Diet modification. Physical activity. Encouraged/praised to build confidence.   High cholesterol Discussed lifestyle modifications. Recommended diet heavy in fruits and veggies, omega 3's. Decrease consumption of animal meats, cheeses, and dairy products. Remain active and exercise as tolerated. Continue to monitor. Check lipids/TSH    Medication management All medications discussed and reviewed in full. All questions and concerns regarding medications addressed.     Hot flashes, menopausal  Continue lexapro, PRN oxybutynin rarely  Off of estrogen and managing Weight loss encouraged  Vitamin D deficiency Continue supplement for goal of 60-100 Monitor Vitamin D levels  Gastroesophageal reflux disease with esophagitis No suspected reflux complications (Barret/stricture). Lifestyle modification:  wt loss, avoid meals 2-3h before bedtime. Consider eliminating food triggers:  chocolate,  caffeine, EtOH, acid/spicy food.  Primary hyperparathyroidism (HCC) Mild, avoiding calcium supplements/tums No hx of renal stones Screen for osteoporosis - DEXA at [facility]  Monitor   CRBBB Denies sx; after discussion will monitor in office, see above plan for reducing CVD risks, defer ECHO or cardiology for now Follow up if new dyspnea, cough, CP, fatigue, dizziness, etc  Screening for DM Education: Reviewed 'ABCs' of diabetes management  Discussed goals to be met and/or maintained include A1C (<7) Blood pressure (<130/80) Cholesterol (LDL <70) Continue Eye Exam yearly  Continue Dental Exam Q6 mo Discussed dietary recommendations Discussed Physical Activity recommendations Check A1C  Screening for blood or protein in urine Check and monitor Urinalysis/Microalbumin Routine w reflex microscopic  Orders Placed This Encounter  Procedures   CBC with Differential/Platelet   COMPLETE METABOLIC PANEL WITH GFR   Magnesium   Lipid panel   TSH   Hemoglobin A1c   Insulin, random   VITAMIN D 25 Hydroxy (Vit-D Deficiency, Fractures)   Urinalysis, Routine w reflex microscopic   Microalbumin / creatinine urine ratio   Vitamin B12   EKG 12-Lead    Notify office for further evaluation and treatment, questions or concerns if any reported s/s fail to improve.   The patient was advised to call back or seek an in-person evaluation if any symptoms worsen or if the condition fails to improve as anticipated.   Further disposition pending results of labs. Discussed med's effects and SE's.    I discussed the assessment and treatment plan with the patient. The patient was provided an opportunity to ask questions and all were answered. The patient agreed with the plan and demonstrated an understanding of the instructions.  Discussed med's effects and SE's. Screening labs and tests as requested with regular follow-up as recommended.  I provided 40 minutes  of face-to-face time during this  encounter including counseling, chart review, and critical decision making was preformed.  Today's Plan of Care is based on a patient-centered health care approach known as shared decision making - the decisions, tests and treatments allow for patient preferences and values to be balanced with clinical evidence.    Future Appointments  Date Time Provider Department Center  12/03/2022  9:30 AM Adela Glimpse, NP [facility] None  08/19/2023  9:00 AM Adela Glimpse, NP [facility] None    HPI  62 y.o. female  presents for a complete physical. She has Bilateral leg and foot pain; Morbid obesity with BMI of 40.0-44.9, adult (HCC); Hypertension; GERD (gastroesophageal reflux disease); Hypothyroidism; Hyperlipidemia; Chronic cough; Abnormal glucose; Hot flashes, menopausal; Vitamin D deficiency; Medication management; Arthritis of carpometacarpal (CMC) joint of left thumb; Primary hyperparathyroidism (HCC); and Complete right bundle branch block (RBBB) on their problem list.  She is married, no children, has labs and chickens. She works [employer] at [employer], enjoys her job. Lots of stress due to just mom with Alzheimer's.  Family continues not to be not supportive.   She has hot flashes, is on lexapro 20 mg, also rarely takes oxyutynin 5 mg as needed for severe symptoms.   She does have bil knee pain, follows with Dr. Magnus Ivan.   BMI is Body mass index is 45.31 kg/m., she has been working on diet and exercise. Currently on Phentermine daily.   Wt Readings from Last 3 Encounters:  08/13/22 232 lb (105.2 kg)  03/11/22 227 lb 9.6 oz (103.2 kg)  08/24/21 221 lb (100.2 kg)    Her blood pressure has been controlled at home, today their BP is BP: 124/78 She does not workout due to time restraints.  She denies chest pain, shortness of breath, dizziness.   She is on cholesterol medication, lipitor 10 daily and denies myalgias. Her cholesterol is at goal. The cholesterol last visit was:   Lab Results   Component Value Date   CHOL 128 03/11/2022   HDL 61 03/11/2022   LDLCALC 50 03/11/2022   TRIG 83 03/11/2022   CHOLHDL 2.1 03/11/2022    She has been working on diet and exercise for prediabetes, she is on metoformin 4 pill a day, and denies paresthesia of the feet, polydipsia, polyuria and visual disturbances. Last A1C in the office was:  Lab Results  Component Value Date   HGBA1C 5.8 (H) 03/11/2022   Patient has been off of vitamin D supplement due to high calcium, previously on 1000 IU Lab Results  Component Value Date   VD25OH 33 03/11/2022    Has some difficulty sleeping.  Of note, last TSH 02/2022 at 0.2 which could contribute.  She takes brand Synthroid daily 137 mcg.  Unable to tolerate Levothyroxine.  Denies palpitations, fever, CP.     Lab Results  Component Value Date   TSH 0.27 (L) 03/11/2022   She has mild hypercalcemia, felt most likely mild hyperparathyroid. Had 24 h urine calcium of 142, no familial calcium. No hx of renal stones, hasn't had DEXA screening.  Lab Results  Component Value Date   PTH 42 03/03/2021   CALCIUM 11.0 (H) 03/11/2022   CAION 1.21 06/15/2007     Current Medications:   Current Outpatient Medications (Endocrine & Metabolic):    metFORMIN (GLUCOPHAGE-XR) 500 MG 24 hr tablet, Take 2 tablets (1,000 mg total) by mouth 2 (two) times daily.   SYNTHROID 137 MCG tablet, TAKE 1 TAB BY MOUTH DAILY ON AN  EMPTY STOMACH WITH ONLY WATER FOR 30 MINUTES & NO ANTACID MEDS, CALCIUM, MAGNESIUM FOR 4 HOURS & AVOID BIOTIN  Current Outpatient Medications (Cardiovascular):    aMILoride (MIDAMOR) 5 MG tablet, Take 1 tablet (5 mg total) by mouth daily.   atorvastatin (LIPITOR) 10 MG tablet, Take 1 tablet (10 mg total) by mouth daily for Cholesterol   candesartan (ATACAND) 32 MG tablet, Take 1 tablet (32 mg total) by mouth nightly for blood pressure   EPINEPHRINE 0.3 mg/0.3 mL IJ SOAJ injection, USE AS DIRECTED   propranolol (INDERAL) 80 MG tablet, Take 1 tablet  (80 mg total) by mouth in the morning and in the evening for blood pressure.  Current Outpatient Medications (Respiratory):    cetirizine (ZYRTEC) 10 MG tablet, Take 10 mg by mouth daily.   fluticasone (FLONASE) 50 MCG/ACT nasal spray, Place 1 spray into both nostrils daily.  Current Outpatient Medications (Analgesics):    aspirin EC 81 MG tablet, Take 81 mg by mouth daily. Swallow whole.   traMADol (ULTRAM) 50 MG tablet, Take 1 tablet (50 mg total) by mouth every 12 (twelve) hours as needed (cough).  Current Outpatient Medications (Hematological):    cyanocobalamin 2000 MCG tablet, Take 2,000 mcg by mouth daily.  Current Outpatient Medications (Other):    Ascorbic Acid (VITAMIN C) 1000 MG tablet, Take 1,000 mg by mouth daily.   buPROPion (WELLBUTRIN XL) 150 MG 24 hr tablet, Take 1 tablet (150 mg total) by mouth every morning.   Cholecalciferol (VITAMIN D-3) 5000 UNITS TABS, Take 5,000 Units by mouth daily.   escitalopram (LEXAPRO) 20 MG tablet, Take 1 tablet (20 mg total) by mouth daily for mood.   esomeprazole (NEXIUM) 40 MG capsule, Take 1 capsule (40 mg total) by mouth daily to prevent heartburn & indigestion   fluconazole (DIFLUCAN) 150 MG tablet, Take 1 tablet (150 mg total) by mouth daily.   Glucosamine-Chondroitin (GLUCOSAMINE CHONDR COMPLEX PO), Take 1,500 mg by mouth. 1500mg /1200mg   Actual dose   ketoconazole (NIZORAL) 2 % cream, Apply 1 application topically 2 (two) times daily.   metroNIDAZOLE (METROCREAM) 0.75 % cream, Apply topically 2 (two) times daily.   nystatin (MYCOSTATIN/NYSTOP) powder, Apply daily after a shower as needed   ondansetron (ZOFRAN) 8 MG tablet, Take 1 tablet (8 mg total) by mouth up to 3 (three) times daily as needed for nausea/vomiting.   oxybutynin (DITROPAN) 5 MG tablet, Take 1 tablet (5 mg total) by mouth 2 (two) times daily as needed (sweating).   phentermine (ADIPEX-P) 37.5 MG tablet, Take 1 tablet (37.5 mg total) by mouth daily before breakfast.    triamcinolone cream (KENALOG) 0.5 %, Apply 1 application topically 2 (two) times daily.  Health Maintenance:   Immunization History  Administered Date(s) Administered   DTaP 12/07/2012   Influenza-Unspecified 11/22/2012, 11/12/2014, 11/17/2017, 11/21/2020   Pneumococcal Polysaccharide-23 02/22/2009   Td 02/23/2000   Tdap 12/07/2012   Zoster, Live 10/29/2010   Health Maintenance  Topic Date Due   COVID-19 Vaccine (1) Never done   INFLUENZA VACCINE  09/23/2022   DTaP/Tdap/Td (4 - Td or Tdap) 12/08/2022   MAMMOGRAM  01/28/2023   PAP SMEAR-Modifier  02/25/2023   Colonoscopy  05/06/2024   Hepatitis C Screening  Completed   HIV Screening  Completed   HPV VACCINES  Aged Out   Zoster Vaccines- Shingrix  Discontinued   Zostavax: 2012, had a reaction, declines shringrix COVID 19: 2/2, will send photo  LMP: 2005 p. Hysterectomy, still has 1 ovary and cervix Pap: 02/2018  due 5 years,  + stress incontinence MGM: 01/2022 DEXA: N/A due age 52  TB gold- negative Colonoscopy: 04/2014, Dr. Madilyn Fireman due 10 years.  EGD: N/A  US thyroid and BX 2013, Dr. Sharl Ma.   Last Dental Exam: Dr. Raford Pitcher, goes q34m  Last Eye Exam: Dr. Charise Killian, glasses, goes annually, did have cataracts done 2023   Patient Care Team: Lucky Cowboy, MD as PCP - General (Internal Medicine) Dorena Cookey, MD (Inactive) as Consulting Physician (Gastroenterology) Talmage Coin, MD as Consulting Physician (Endocrinology)  Medical History:  Past Medical History:  Diagnosis Date   Biliary dyskinesia 12/07/2011   GERD (gastroesophageal reflux disease)    High cholesterol    Hypertension    Hypothyroidism    Multinodular goiter    Allergies Allergies  Allergen Reactions   Avelox [Moxifloxacin Hcl In Nacl] Hives    Thrush, throat might have closed    Hydrochlorothiazide Itching    On hands and feet   Other Itching    All "Cillins", hives alao   Penicillins     Hives   Prednisone     Chest pains   Zostavax [Zoster  Vaccine Live]    Codeine Hives, Itching and Rash   Levothyroxine Rash    Only to generic. Not to brand synthroid.    Semaglutide Rash   Sulfa Antibiotics Itching and Rash    Hands and feet itch and turn red    SURGICAL HISTORY She  has a past surgical history that includes Carpal tunnel release (yrs ago); Knee surgery (arthroscopic, 3 yrs ago); Tonsillectomy (age 31); Abdominal hysterectomy (8 yrs ago); Tubal ligation (yrs ago); radioactive iodine to thyroid' (march 2013); Cholecystectomy (12/28/2011); Cataract extraction w/PHACO (Right, 05/04/2021); and Cataract extraction w/PHACO (Left, 05/18/2021). FAMILY HISTORY Her family history includes AAA (abdominal aortic aneurysm) in her father; Alcoholism in her sister; Alzheimer's disease in her mother; Breast cancer (age of onset: 43) in her mother; CAD in her brother; Heart attack in her father and paternal grandmother; Heart attack (age of onset: 77) in her brother; Stroke in her father and mother; Suicidality in her maternal grandfather and maternal grandmother. SOCIAL HISTORY She  reports that she has never smoked. She has never used smokeless tobacco. She reports that she does not drink alcohol and does not use drugs.  Review of Systems  Constitutional: Negative.  Negative for chills, fever, malaise/fatigue and weight loss.  HENT: Negative.  Negative for hearing loss and tinnitus.   Eyes: Negative.  Negative for blurred vision and double vision.  Respiratory: Negative.  Negative for cough, shortness of breath and wheezing.   Cardiovascular: Negative.  Negative for chest pain, palpitations, orthopnea, claudication and leg swelling.  Gastrointestinal: Negative.  Negative for abdominal pain, blood in stool, constipation, diarrhea, heartburn, melena, nausea and vomiting.  Genitourinary: Negative.  Negative for dysuria, flank pain, frequency, hematuria and urgency.  Musculoskeletal:  Positive for joint pain. Negative for back pain, falls, myalgias  and neck pain.  Skin:  Negative for rash.  Neurological: Negative.  Negative for dizziness, tingling, sensory change, weakness and headaches.  Endo/Heme/Allergies: Negative.  Negative for polydipsia.  Psychiatric/Behavioral: Negative.  Negative for depression. The patient does not have insomnia.   All other systems reviewed and are negative.    Physical Exam: Estimated body mass index is 45.31 kg/m as calculated from the following:   Height as of this encounter: 5' (1.524 m).   Weight as of this encounter: 232 lb (105.2 kg). BP 124/78   Pulse 85  Temp 97.9 F (36.6 C)   Resp 16   Ht 5' (1.524 m)   Wt 232 lb (105.2 kg)   SpO2 96%   BMI 45.31 kg/m  General Appearance: Well nourished, in no apparent distress.  Eyes: PERRLA, EOMs, conjunctiva no swelling or erythema Sinuses: No Frontal/maxillary tenderness  ENT/Mouth: Ext aud canals clear, normal light reflex with TMs without erythema, bulging. Good dentition. No erythema, swelling, or exudate on post pharynx. Tonsils not swollen or erythematous. Hearing normal.  Neck: Supple, thyroid normal. No bruits  Respiratory: Respiratory effort normal, BS equal bilaterally without rales, rhonchi, wheezing or stridor.  Cardio: RRR without murmurs, rubs or gallops. Brisk peripheral pulses without edema.  Chest: symmetric, with normal excursions and percussion.  Breasts: Declined today Abdomen: Soft, nontender, morbidly obese limiting exam, no guarding, rebound, hernias, masses, or organomegaly. .  Lymphatics: Non tender without lymphadenopathy.  Genitourinary: defer Musculoskeletal: Limited exam by body habitus, symmetrical ROM all peripheral extremities,5/5 strength, and normal gait.  Skin: Warm, dry without rashes, lesions, ecchymosis. Neuro: Cranial nerves intact, reflexes equal bilaterally. Normal muscle tone, no cerebellar symptoms. Sensation intact.  Psych: Awake and oriented X 3, normal affect, Insight and Judgment appropriate.    EKG: CRBBB, stable from last 6 months ago  AORTA SCAN:  Get at age 59, family hx of AAA in father  Adela Glimpse, NP-C 11:27 AM [facility]

## 2022-08-13 NOTE — Patient Instructions (Signed)

## 2022-08-14 LAB — HEMOGLOBIN A1C: Hgb A1c MFr Bld: 6 % of total Hgb — ABNORMAL HIGH (ref <5.7)

## 2022-08-14 LAB — COMPLETE METABOLIC PANEL WITH GFR
AST: 15 U/L (ref 10–35)
Albumin: 4.2 g/dL (ref 3.6–5.1)
Alkaline phosphatase (APISO): 91 U/L (ref 37–153)
BUN: 20 mg/dL (ref 7–25)
CO2: 23 mmol/L (ref 20–32)
Chloride: 105 mmol/L (ref 98–110)
Potassium: 5.5 mmol/L — ABNORMAL HIGH (ref 3.5–5.3)
Total Bilirubin: 0.2 mg/dL (ref 0.2–1.2)
eGFR: 56 mL/min/{1.73_m2} — ABNORMAL LOW (ref >=60)

## 2022-08-14 LAB — CBC WITH DIFFERENTIAL/PLATELET
Basophils Absolute: 113 cells/uL (ref 0–200)
Eosinophils Absolute: 433 cells/uL (ref 15–500)
Hemoglobin: 12.1 g/dL (ref 11.7–15.5)
Lymphs Abs: 2833 cells/uL (ref 850–3900)
MCH: 28.8 pg (ref 27.0–33.0)
MPV: 10.9 fL (ref 7.5–12.5)
Platelets: 329 10*3/uL (ref 140–400)
RDW: 12.5 % (ref 11.0–15.0)
Total Lymphocyte: 27.5 %

## 2022-08-14 LAB — URINALYSIS, ROUTINE W REFLEX MICROSCOPIC
Ketones, ur: NEGATIVE
Leukocytes,Ua: NEGATIVE
pH: 5.5 (ref 5.0–8.0)

## 2022-08-14 LAB — MAGNESIUM: Magnesium: 1.7 mg/dL (ref 1.5–2.5)

## 2022-08-14 LAB — LIPID PANEL: Non-HDL Cholesterol (Calc): 96 mg/dL (calc) (ref <130)

## 2022-08-14 LAB — VITAMIN D 25 HYDROXY (VIT D DEFICIENCY, FRACTURES): Vit D, 25-Hydroxy: 53 ng/mL (ref 30–100)

## 2022-08-16 ENCOUNTER — Other Ambulatory Visit: Payer: Self-pay

## 2022-08-16 ENCOUNTER — Other Ambulatory Visit (HOSPITAL_COMMUNITY): Payer: Self-pay

## 2022-08-16 LAB — CBC WITH DIFFERENTIAL/PLATELET
Basophils Relative: 1.1 %
Eosinophils Relative: 4.2 %
MCHC: 32.8 g/dL (ref 32.0–36.0)
MCV: 87.9 fL (ref 80.0–100.0)
Monocytes Relative: 5.6 %
Neutro Abs: 6345 cells/uL (ref 1500–7800)
Neutrophils Relative %: 61.6 %
RBC: 4.2 10*6/uL (ref 3.80–5.10)
WBC: 10.3 10*3/uL (ref 3.8–10.8)

## 2022-08-16 LAB — URINALYSIS, ROUTINE W REFLEX MICROSCOPIC
Bilirubin Urine: NEGATIVE
Glucose, UA: NEGATIVE
Hgb urine dipstick: NEGATIVE
Nitrite: NEGATIVE
Protein, ur: NEGATIVE
Specific Gravity, Urine: 1.019 (ref 1.001–1.035)

## 2022-08-16 LAB — LIPID PANEL
HDL: 64 mg/dL (ref >=50)
LDL Cholesterol (Calc): 74 mg/dL (calc)
Triglycerides: 136 mg/dL (ref <150)

## 2022-08-16 LAB — MICROALBUMIN / CREATININE URINE RATIO
Creatinine, Urine: 98 mg/dL (ref 20–275)
Microalb Creat Ratio: 9 mg/g creat (ref <30)
Microalb, Ur: 0.9 mg/dL

## 2022-08-16 LAB — COMPLETE METABOLIC PANEL WITH GFR
AG Ratio: 1.8 (calc) (ref 1.0–2.5)
ALT: 16 U/L (ref 6–29)
BUN/Creatinine Ratio: 18 (calc) (ref 6–22)
Calcium: 10.5 mg/dL — ABNORMAL HIGH (ref 8.6–10.4)
Creat: 1.11 mg/dL — ABNORMAL HIGH (ref 0.50–1.05)
Globulin: 2.4 g/dL (calc) (ref 1.9–3.7)
Total Protein: 6.6 g/dL (ref 6.1–8.1)

## 2022-08-16 LAB — VITAMIN B12: Vitamin B-12: 334 pg/mL (ref 200–1100)

## 2022-08-16 LAB — INSULIN, RANDOM: Insulin: 22.5 u[IU]/mL — ABNORMAL HIGH

## 2022-08-16 LAB — HEMOGLOBIN A1C
Mean Plasma Glucose: 126 mg/dL
eAG (mmol/L): 7 mmol/L

## 2022-08-16 LAB — TSH: TSH: 0.87 mIU/L (ref 0.40–4.50)

## 2022-08-27 ENCOUNTER — Encounter: Payer: Self-pay | Admitting: Nurse Practitioner

## 2022-08-28 ENCOUNTER — Other Ambulatory Visit: Payer: Self-pay | Admitting: Internal Medicine

## 2022-08-28 ENCOUNTER — Other Ambulatory Visit (HOSPITAL_COMMUNITY): Payer: Self-pay

## 2022-08-28 DIAGNOSIS — E039 Hypothyroidism, unspecified: Secondary | ICD-10-CM

## 2022-08-28 MED ORDER — SYNTHROID 137 MCG PO TABS
137.0000 ug | ORAL_TABLET | Freq: Every day | ORAL | 1 refills | Status: DC
Start: 2022-08-28 — End: 2022-11-09
  Filled 2022-08-28: qty 90, 90d supply, fill #0

## 2022-08-28 MED ORDER — SYNTHROID 137 MCG PO TABS
137.0000 ug | ORAL_TABLET | Freq: Every day | ORAL | 1 refills | Status: DC
  Filled 2022-08-28: qty 90, 90d supply, fill #0

## 2022-08-30 ENCOUNTER — Other Ambulatory Visit (HOSPITAL_COMMUNITY): Payer: Self-pay

## 2022-09-07 NOTE — Progress Notes (Deleted)
Assessment and Plan:  There are no diagnoses linked to this encounter.    Further disposition pending results of labs. Discussed med's effects and SE's.   Over 30 minutes of exam, counseling, chart review, and critical decision making was performed.   Future Appointments  Date Time Provider Department Center  09/08/2022  9:45 AM Raynelle Dick, NP [facility] None  12/03/2022  9:30 AM Adela Glimpse, NP [facility] None  08/19/2023  9:00 AM Adela Glimpse, NP [facility] None    ------------------------------------------------------------------------------------------------------------------   HPI There were no vitals taken for this visit. 62 y.o.female presents for  Past Medical History:  Diagnosis Date   Biliary dyskinesia 12/07/2011   GERD (gastroesophageal reflux disease)    High cholesterol    Hypertension    Hypothyroidism    Multinodular goiter      Allergies  Allergen Reactions   Avelox [Moxifloxacin Hcl In Nacl] Hives    Thrush, throat might have closed    Hydrochlorothiazide Itching    On hands and feet   Other Itching    All "Cillins", hives alao   Penicillins     Hives   Prednisone     Chest pains   Zostavax [Zoster Vaccine Live]    Codeine Hives, Itching and Rash   Levothyroxine Rash    Only to generic. Not to brand synthroid.    Semaglutide Rash   Sulfa Antibiotics Itching and Rash    Hands and feet itch and turn red    Current Outpatient Medications on File Prior to Visit  Medication Sig   aMILoride (MIDAMOR) 5 MG tablet Take 1 tablet (5 mg total) by mouth daily.   Ascorbic Acid (VITAMIN C) 1000 MG tablet Take 1,000 mg by mouth daily.   aspirin EC 81 MG tablet Take 81 mg by mouth daily. Swallow whole.   atorvastatin (LIPITOR) 10 MG tablet Take 1 tablet (10 mg total) by mouth daily for Cholesterol   buPROPion (WELLBUTRIN XL) 150 MG 24 hr tablet Take 1 tablet (150 mg total) by mouth every morning.   candesartan (ATACAND) 32 MG tablet Take 1  tablet (32 mg total) by mouth nightly for blood pressure   cetirizine (ZYRTEC) 10 MG tablet Take 10 mg by mouth daily.   Cholecalciferol (VITAMIN D-3) 5000 UNITS TABS Take 5,000 Units by mouth daily.   cyanocobalamin 2000 MCG tablet Take 2,000 mcg by mouth daily.   EPINEPHRINE 0.3 mg/0.3 mL IJ SOAJ injection USE AS DIRECTED   escitalopram (LEXAPRO) 20 MG tablet Take 1 tablet (20 mg total) by mouth daily for mood.   esomeprazole (NEXIUM) 40 MG capsule Take 1 capsule (40 mg total) by mouth daily to prevent heartburn & indigestion   fluconazole (DIFLUCAN) 150 MG tablet Take 1 tablet (150 mg total) by mouth daily.   fluticasone (FLONASE) 50 MCG/ACT nasal spray Place 1 spray into both nostrils daily.   Glucosamine-Chondroitin (GLUCOSAMINE CHONDR COMPLEX PO) Take 1,500 mg by mouth. 1500mg /1200mg   Actual dose   ketoconazole (NIZORAL) 2 % cream Apply 1 application topically 2 (two) times daily.   metFORMIN (GLUCOPHAGE-XR) 500 MG 24 hr tablet Take 2 tablets (1,000 mg total) by mouth 2 (two) times daily.   metroNIDAZOLE (METROCREAM) 0.75 % cream Apply topically 2 (two) times daily.   nystatin (MYCOSTATIN/NYSTOP) powder Apply daily after a shower as needed   ondansetron (ZOFRAN) 8 MG tablet Take 1 tablet (8 mg total) by mouth up to 3 (three) times daily as needed for nausea/vomiting.   oxybutynin (DITROPAN) 5 MG  tablet Take 1 tablet (5 mg total) by mouth 2 (two) times daily as needed (sweating).   phentermine (ADIPEX-P) 37.5 MG tablet Take 1 tablet (37.5 mg total) by mouth daily before breakfast.   propranolol (INDERAL) 80 MG tablet Take 1 tablet (80 mg total) by mouth in the morning and in the evening for blood pressure.   SYNTHROID 137 MCG tablet TAKE 1 TAB BY MOUTH DAILY ON AN EMPTY STOMACH WITH ONLY WATER FOR 30 MINUTES & NO ANTACID MEDS, CALCIUM, MAGNESIUM FOR 4 HOURS & AVOID BIOTIN   traMADol (ULTRAM) 50 MG tablet Take 1 tablet (50 mg total) by mouth every 12 (twelve) hours as needed (cough).    triamcinolone cream (KENALOG) 0.5 % Apply 1 application topically 2 (two) times daily.   No current facility-administered medications on file prior to visit.    ROS: all negative except above.   Physical Exam:  There were no vitals taken for this visit.  General Appearance: Well nourished, in no apparent distress. Eyes: PERRLA, EOMs, conjunctiva no swelling or erythema Sinuses: No Frontal/maxillary tenderness ENT/Mouth: Ext aud canals clear, TMs without erythema, bulging. No erythema, swelling, or exudate on post pharynx.  Tonsils not swollen or erythematous. Hearing normal.  Neck: Supple, thyroid normal.  Respiratory: Respiratory effort normal, BS equal bilaterally without rales, rhonchi, wheezing or stridor.  Cardio: RRR with no MRGs. Brisk peripheral pulses without edema.  Abdomen: Soft, + BS.  Non tender, no guarding, rebound, hernias, masses. Lymphatics: Non tender without lymphadenopathy.  Musculoskeletal: Full ROM, 5/5 strength, normal gait.  Skin: Warm, dry without rashes, lesions, ecchymosis.  Neuro: Cranial nerves intact. Normal muscle tone, no cerebellar symptoms. Sensation intact.  Psych: Awake and oriented X 3, normal affect, Insight and Judgment appropriate.     Raynelle Dick, NP 12:15 PM [facility]

## 2022-09-08 ENCOUNTER — Ambulatory Visit: Payer: Commercial Managed Care - PPO | Admitting: Nurse Practitioner

## 2022-09-08 ENCOUNTER — Other Ambulatory Visit (HOSPITAL_COMMUNITY): Payer: Self-pay

## 2022-09-08 ENCOUNTER — Telehealth: Payer: Commercial Managed Care - PPO | Admitting: Physician Assistant

## 2022-09-08 DIAGNOSIS — J340 Abscess, furuncle and carbuncle of nose: Secondary | ICD-10-CM

## 2022-09-08 DIAGNOSIS — L03211 Cellulitis of face: Secondary | ICD-10-CM | POA: Diagnosis not present

## 2022-09-08 MED ORDER — MUPIROCIN 2 % EX OINT
1.0000 | TOPICAL_OINTMENT | Freq: Two times a day (BID) | CUTANEOUS | 0 refills | Status: DC
Start: 2022-09-08 — End: 2023-06-07
  Filled 2022-09-08: qty 22, 11d supply, fill #0

## 2022-09-08 MED ORDER — DOXYCYCLINE HYCLATE 100 MG PO TABS
100.0000 mg | ORAL_TABLET | Freq: Two times a day (BID) | ORAL | 0 refills | Status: DC
Start: 2022-09-08 — End: 2022-12-13
  Filled 2022-09-08: qty 14, 7d supply, fill #0

## 2022-09-08 NOTE — Patient Instructions (Signed)
Joetta Manners, thank you for joining Margaretann Loveless, PA-C for today's virtual visit.  While this provider is not your primary care provider (PCP), if your PCP is located in our provider database this encounter information will be shared with them immediately following your visit.   A [facility] MyChart account gives you access to today's visit and all your visits, tests, and labs performed at [facility] " click here if you don't have a [facility] MyChart account or go to [email or web address]  Consent: (Patient) Norma Sanchez provided verbal consent for this virtual visit at the beginning of the encounter.  Current Medications:  Current Outpatient Medications:    doxycycline (VIBRA-TABS) 100 MG tablet, Take 1 tablet (100 mg total) by mouth 2 (two) times daily., Disp: 14 tablet, Rfl: 0   mupirocin ointment (BACTROBAN) 2 %, Apply 1 Application topically 2 (two) times daily., Disp: 22 g, Rfl: 0   aMILoride (MIDAMOR) 5 MG tablet, Take 1 tablet (5 mg total) by mouth daily., Disp: 90 tablet, Rfl: 1   Ascorbic Acid (VITAMIN C) 1000 MG tablet, Take 1,000 mg by mouth daily., Disp: , Rfl:    aspirin EC 81 MG tablet, Take 81 mg by mouth daily. Swallow whole., Disp: , Rfl:    atorvastatin (LIPITOR) 10 MG tablet, Take 1 tablet (10 mg total) by mouth daily for Cholesterol, Disp: 90 tablet, Rfl: 3   buPROPion (WELLBUTRIN XL) 150 MG 24 hr tablet, Take 1 tablet (150 mg total) by mouth every morning., Disp: 90 tablet, Rfl: 3   candesartan (ATACAND) 32 MG tablet, Take 1 tablet (32 mg total) by mouth nightly for blood pressure, Disp: 90 tablet, Rfl: 1   cetirizine (ZYRTEC) 10 MG tablet, Take 10 mg by mouth daily., Disp: , Rfl:    Cholecalciferol (VITAMIN D-3) 5000 UNITS TABS, Take 5,000 Units by mouth daily., Disp: , Rfl:    cyanocobalamin 2000 MCG tablet, Take 2,000 mcg by mouth daily., Disp: , Rfl:    EPINEPHRINE 0.3 mg/0.3 mL IJ SOAJ injection, USE AS DIRECTED, Disp: 2 Device, Rfl:  PRN   escitalopram (LEXAPRO) 20 MG tablet, Take 1 tablet (20 mg total) by mouth daily for mood., Disp: 90 tablet, Rfl: 3   esomeprazole (NEXIUM) 40 MG capsule, Take 1 capsule (40 mg total) by mouth daily to prevent heartburn & indigestion, Disp: 90 capsule, Rfl: 1   fluconazole (DIFLUCAN) 150 MG tablet, Take 1 tablet (150 mg total) by mouth daily., Disp: 10 tablet, Rfl: 0   fluticasone (FLONASE) 50 MCG/ACT nasal spray, Place 1 spray into both nostrils daily., Disp: 48 g, Rfl: 2   Glucosamine-Chondroitin (GLUCOSAMINE CHONDR COMPLEX PO), Take 1,500 mg by mouth. 1500mg /1200mg   Actual dose, Disp: , Rfl:    ketoconazole (NIZORAL) 2 % cream, Apply 1 application topically 2 (two) times daily., Disp: 30 g, Rfl: 0   metFORMIN (GLUCOPHAGE-XR) 500 MG 24 hr tablet, Take 2 tablets (1,000 mg total) by mouth 2 (two) times daily., Disp: 360 tablet, Rfl: 95   metroNIDAZOLE (METROCREAM) 0.75 % cream, Apply topically 2 (two) times daily., Disp: 45 g, Rfl: 1   nystatin (MYCOSTATIN/NYSTOP) powder, Apply daily after a shower as needed, Disp: 60 g, Rfl: 2   ondansetron (ZOFRAN) 8 MG tablet, Take 1 tablet (8 mg total) by mouth up to 3 (three) times daily as needed for nausea/vomiting., Disp: 30 tablet, Rfl: 1   oxybutynin (DITROPAN) 5 MG tablet, Take 1 tablet (5 mg total) by mouth 2 (two) times daily as needed (  sweating)., Disp: 60 tablet, Rfl: 0   phentermine (ADIPEX-P) 37.5 MG tablet, Take 1 tablet (37.5 mg total) by mouth daily before breakfast., Disp: 30 tablet, Rfl: 2   propranolol (INDERAL) 80 MG tablet, Take 1 tablet (80 mg total) by mouth in the morning and in the evening for blood pressure., Disp: 180 tablet, Rfl: 1   SYNTHROID 137 MCG tablet, TAKE 1 TAB BY MOUTH DAILY ON AN EMPTY STOMACH WITH ONLY WATER FOR 30 MINUTES & NO ANTACID MEDS, CALCIUM, MAGNESIUM FOR 4 HOURS & AVOID BIOTIN, Disp: 90 tablet, Rfl: 1   traMADol (ULTRAM) 50 MG tablet, Take 1 tablet (50 mg total) by mouth every 12 (twelve) hours as needed  (cough)., Disp: 30 tablet, Rfl: 0   triamcinolone cream (KENALOG) 0.5 %, Apply 1 application topically 2 (two) times daily., Disp: 80 g, Rfl: 2   Medications ordered in this encounter:  Meds ordered this encounter  Medications   doxycycline (VIBRA-TABS) 100 MG tablet    Sig: Take 1 tablet (100 mg total) by mouth 2 (two) times daily.    Dispense:  14 tablet    Refill:  0    Order Specific Question:   Supervising Provider    Answer:   Merrilee Jansky [[id number]]   mupirocin ointment (BACTROBAN) 2 %    Sig: Apply 1 Application topically 2 (two) times daily.    Dispense:  22 g    Refill:  0    Order Specific Question:   Supervising Provider    Answer:   Merrilee Jansky [id number]     *If you need refills on other medications prior to your next appointment, please contact your pharmacy*  Follow-Up: Call back or seek an in-person evaluation if the symptoms worsen or if the condition fails to improve as anticipated.  [facility] [phone]  Other Instructions Nasal Abscess  A nasal abscess is an infection that causes pus to build up between the bone or cartilage that separates one side of the nose from the other (nasal septum). The front part of the nasal septum is cartilage, and the back part is bone. A nasal abscess causes pain and stuffiness (nasal congestion). It can affect one or both sides of the nose. This condition is dangerous because the infection can spread to the brain or the eye. It can also cause the septum to collapse and result in a deformed nose (saddle nose). Treatment for a nasal abscess needs to start as soon as the diagnosis is made. What are the causes? This condition is caused by an infection from bacteria. A common type of skin bacteria (staphylococcus) is the most common cause, but other bacteria may also cause the infection. A nasal abscess may result from: An injury to the nasal septum that causes bleeding between the covering layer of the  septal cartilage or the bony septum (septal hematoma). Bacteria from inside the nose can enter the hematoma and cause the infection that leads to an abscess. An object inserted into the nose (foreign body). An infection that spreads to the septum from another area of the body, such as a sinus infection, dental infection, or an infected hair follicle in the nose (furuncle). What increases the risk? You are more likely to develop this condition if: You have diabetes. Your body's defense system (immune system) is weak. You have nasal surgery. What are the signs or symptoms? Symptoms of this condition include: Not being able to breathe through your nose (nasal obstruction). This  is the main symptom. Nose pain. Swelling and redness of the outer nose. Pain when touching the nose (tenderness). Fever. Headache. How is this diagnosed? This condition may be diagnosed based on: Your symptoms and medical history. A physical exam. During the exam, your health care provider will use an instrument (nasal speculum) to look inside your nose and check for a soft red or blue swelling that is blocking one or both sides of your nose. Tests, such as: CT scans of the nose and sinuses. Blood tests to check for signs of infection. A lab test on a sample of pus to check what kind of bacteria is causing the infection and determine what antibiotic medicine will be most effective (culture and sensitivity test). How is this treated? Treatment needs to start right away to prevent complications. Treatment may include: Antibiotic medicine given through an IV inserted into one of your veins. You may be given antibiotics that are effective against staphylococcus and other common bacteria (broad spectrum antibiotics). You may also get a combination of antibiotics. Antibiotics to be taken by mouth (orally). You may be sent home with this medicine after the abscess is drained and the infection is controlled with IV  antibiotics. Your antibiotic may be switched if the results of your culture and sensitivity test indicate another antibiotic would be more effective. A procedure to drain the abscess surgically (incision and drainage) and test the pus for bacteria. The pus may be sent for a culture and sensitivity test. You may have a type of bandage (packing) placed in your nose after the abscess is drained. Follow these instructions at home:  Take your antibiotic medicine as told by your health care provider. Do not stop taking the antibiotic even if you start to feel better. Take over-the-counter and prescription medicines only as told by your health care provider. Ask your health care provider if the medicine prescribed to you requires you to avoid driving or using machinery. Return to your normal activities as told by your health care provider. Ask your health care provider what activities are safe for you. If you have a nasal packing, ask your health care provider when you should come back to have the packing removed. Keep all follow-up visits. This is important. Contact a health care provider if: You have chills or a fever. You have any signs or symptoms of the nasal abscess coming back. You have any blood or pus draining from your nose. Your nose becomes deformed. Get help right away if: You have a severe headache, stiff neck, or eye pain. You have a sudden change in vision. Summary A nasal abscess is an infection that causes a collection of pus near the bone or cartilage that separates one side of the nose from the other (nasal septum). A nasal abscess may result from an injury to the septum that caused bleeding that collected (hematoma) and became infected. Common symptoms of this condition include nasal pain and not being able to breathe through your nose. Treatment needs to be started right away to prevent complications. Treatment may include IV antibiotics as well as incision and drainage. Get  help right away if you have a severe headache, stiff neck, eye pain, or a sudden change in vision. This information is not intended to replace advice given to you by your health care provider. Make sure you discuss any questions you have with your health care provider. Document Revised: 01/28/2021 Document Reviewed: 01/28/2021 Elsevier Patient Education  2024 [employer].  If you have been instructed to have an in-person evaluation today at a local Urgent Care facility, please use the link below. It will take you to a list of all of our available [facility], including address, phone number and hours of operation. Please do not delay care.  [facility]  If you or a family member do not have a primary care provider, use the link below to schedule a visit and establish care. When you choose a [facility] primary care physician or advanced practice provider, you gain a long-term partner in health. Find a Primary Care Provider  Learn more about [facility]'s in-office and virtual care options: [facility] Get Care Now

## 2022-09-08 NOTE — Progress Notes (Signed)
Virtual Visit Consent   Norma Sanchez, you are scheduled for a virtual visit with a  provider today. Just as with appointments in the office, your consent must be obtained to participate. Your consent will be active for this visit and any virtual visit you may have with one of our providers in the next 365 days. If you have a MyChart account, a copy of this consent can be sent to you electronically.  As this is a virtual visit, video technology does not allow for your provider to perform a traditional examination. This may limit your provider's ability to fully assess your condition. If your provider identifies any concerns that need to be evaluated in person or the need to arrange testing (such as labs, EKG, etc.), we will make arrangements to do so. Although advances in technology are sophisticated, we cannot ensure that it will always work on either your end or our end. If the connection with a video visit is poor, the visit may have to be switched to a telephone visit. With either a video or telephone visit, we are not always able to ensure that we have a secure connection.  By engaging in this virtual visit, you consent to the provision of healthcare and authorize for your insurance to be billed (if applicable) for the services provided during this visit. Depending on your insurance coverage, you may receive a charge related to this service.  I need to obtain your verbal consent now. Are you willing to proceed with your visit today? Norma Sanchez has provided verbal consent on 09/08/2022 for a virtual visit (video or telephone). Norma Loveless, PA-C  Date: 09/08/2022 6:38 PM  Virtual Visit via Video Note   I, Norma Sanchez, connected with  Norma Sanchez  ([id number][id number]) on 09/08/22 at  6:30 PM EDT by a video-enabled telemedicine application and verified that I am speaking with the correct person using two identifiers.  Location: Patient: Virtual Visit Location  Patient: Home Provider: Virtual Visit Location Provider: Home Office   I discussed the limitations of evaluation and management by telemedicine and the availability of in person appointments. The patient expressed understanding and agreed to proceed.    History of Present Illness: Norma Sanchez is a 62 y.o. who identifies as a female who was assigned female at birth, and is being seen today for a sore inside her nose and swelling on the outside of the nose. Started Monday, 09/06/22. Awoke with a headache and some tenderness over the right side of the nose and the right maxillary sinus area. Symptoms progressed to develop a hard and very tender to touch swollen area over the right nasal bridge. Swelling progressed into the right lower orbital area. There is also a tenderness and raw area on the inside of the right nare. Denies fevers, chills, nausea, and vomiting.    Problems:  Patient Active Problem List   Diagnosis Date Noted   Complete right bundle branch block (RBBB) 07/31/2021   Primary hyperparathyroidism (HCC) 01/30/2021   Arthritis of carpometacarpal (CMC) joint of left thumb 12/23/2020   Vitamin D deficiency 03/20/2014   Medication management 03/20/2014   Abnormal glucose 12/21/2013   Hot flashes, menopausal 12/21/2013   Chronic cough 02/01/2013   Hypertension    GERD (gastroesophageal reflux disease)    Hypothyroidism    Hyperlipidemia    Bilateral leg and foot pain 07/23/2011   Morbid obesity with BMI of 40.0-44.9, adult (HCC) 07/23/2011    Allergies:  Allergies  Allergen Reactions   Avelox [Moxifloxacin Hcl In Nacl] Hives    Thrush, throat might have closed    Hydrochlorothiazide Itching    On hands and feet   Other Itching    All "Cillins", hives alao   Penicillins     Hives   Prednisone     Chest pains   Zostavax [Zoster Vaccine Live]    Codeine Hives, Itching and Rash   Levothyroxine Rash    Only to generic. Not to brand synthroid.    Semaglutide Rash    Sulfa Antibiotics Itching and Rash    Hands and feet itch and turn red   Medications:  Current Outpatient Medications:    doxycycline (VIBRA-TABS) 100 MG tablet, Take 1 tablet (100 mg total) by mouth 2 (two) times daily., Disp: 14 tablet, Rfl: 0   mupirocin ointment (BACTROBAN) 2 %, Apply 1 Application topically 2 (two) times daily., Disp: 22 g, Rfl: 0   aMILoride (MIDAMOR) 5 MG tablet, Take 1 tablet (5 mg total) by mouth daily., Disp: 90 tablet, Rfl: 1   Ascorbic Acid (VITAMIN C) 1000 MG tablet, Take 1,000 mg by mouth daily., Disp: , Rfl:    aspirin EC 81 MG tablet, Take 81 mg by mouth daily. Swallow whole., Disp: , Rfl:    atorvastatin (LIPITOR) 10 MG tablet, Take 1 tablet (10 mg total) by mouth daily for Cholesterol, Disp: 90 tablet, Rfl: 3   buPROPion (WELLBUTRIN XL) 150 MG 24 hr tablet, Take 1 tablet (150 mg total) by mouth every morning., Disp: 90 tablet, Rfl: 3   candesartan (ATACAND) 32 MG tablet, Take 1 tablet (32 mg total) by mouth nightly for blood pressure, Disp: 90 tablet, Rfl: 1   cetirizine (ZYRTEC) 10 MG tablet, Take 10 mg by mouth daily., Disp: , Rfl:    Cholecalciferol (VITAMIN D-3) 5000 UNITS TABS, Take 5,000 Units by mouth daily., Disp: , Rfl:    cyanocobalamin 2000 MCG tablet, Take 2,000 mcg by mouth daily., Disp: , Rfl:    EPINEPHRINE 0.3 mg/0.3 mL IJ SOAJ injection, USE AS DIRECTED, Disp: 2 Device, Rfl: PRN   escitalopram (LEXAPRO) 20 MG tablet, Take 1 tablet (20 mg total) by mouth daily for mood., Disp: 90 tablet, Rfl: 3   esomeprazole (NEXIUM) 40 MG capsule, Take 1 capsule (40 mg total) by mouth daily to prevent heartburn & indigestion, Disp: 90 capsule, Rfl: 1   fluconazole (DIFLUCAN) 150 MG tablet, Take 1 tablet (150 mg total) by mouth daily., Disp: 10 tablet, Rfl: 0   fluticasone (FLONASE) 50 MCG/ACT nasal spray, Place 1 spray into both nostrils daily., Disp: 48 g, Rfl: 2   Glucosamine-Chondroitin (GLUCOSAMINE CHONDR COMPLEX PO), Take 1,500 mg by mouth. 1500mg /1200mg    Actual dose, Disp: , Rfl:    ketoconazole (NIZORAL) 2 % cream, Apply 1 application topically 2 (two) times daily., Disp: 30 g, Rfl: 0   metFORMIN (GLUCOPHAGE-XR) 500 MG 24 hr tablet, Take 2 tablets (1,000 mg total) by mouth 2 (two) times daily., Disp: 360 tablet, Rfl: 95   metroNIDAZOLE (METROCREAM) 0.75 % cream, Apply topically 2 (two) times daily., Disp: 45 g, Rfl: 1   nystatin (MYCOSTATIN/NYSTOP) powder, Apply daily after a shower as needed, Disp: 60 g, Rfl: 2   ondansetron (ZOFRAN) 8 MG tablet, Take 1 tablet (8 mg total) by mouth up to 3 (three) times daily as needed for nausea/vomiting., Disp: 30 tablet, Rfl: 1   oxybutynin (DITROPAN) 5 MG tablet, Take 1 tablet (5 mg total) by mouth 2 (  two) times daily as needed (sweating)., Disp: 60 tablet, Rfl: 0   phentermine (ADIPEX-P) 37.5 MG tablet, Take 1 tablet (37.5 mg total) by mouth daily before breakfast., Disp: 30 tablet, Rfl: 2   propranolol (INDERAL) 80 MG tablet, Take 1 tablet (80 mg total) by mouth in the morning and in the evening for blood pressure., Disp: 180 tablet, Rfl: 1   SYNTHROID 137 MCG tablet, TAKE 1 TAB BY MOUTH DAILY ON AN EMPTY STOMACH WITH ONLY WATER FOR 30 MINUTES & NO ANTACID MEDS, CALCIUM, MAGNESIUM FOR 4 HOURS & AVOID BIOTIN, Disp: 90 tablet, Rfl: 1   traMADol (ULTRAM) 50 MG tablet, Take 1 tablet (50 mg total) by mouth every 12 (twelve) hours as needed (cough)., Disp: 30 tablet, Rfl: 0   triamcinolone cream (KENALOG) 0.5 %, Apply 1 application topically 2 (two) times daily., Disp: 80 g, Rfl: 2  Observations/Objective: Patient is well-developed, well-nourished in no acute distress.  Resting comfortably at home.  Head is normocephalic, atraumatic.  No labored breathing.  Speech is clear and coherent with logical content.  Patient is alert and oriented at baseline.  Right side of nose at bridge is swollen and mildly erythematous. Swelling is mild, but obvious under the right eye with even more faint redness than at the bridge  of the nose  Assessment and Plan: 1. Nasal abscess - doxycycline (VIBRA-TABS) 100 MG tablet; Take 1 tablet (100 mg total) by mouth 2 (two) times daily.  Dispense: 14 tablet; Refill: 0 - mupirocin ointment (BACTROBAN) 2 %; Apply 1 Application topically 2 (two) times daily.  Dispense: 22 g; Refill: 0  2. Cellulitis of face  - Doxycycline prescribed - Mupirocin for intranasal application - Tylenol and Ibuprofen alternating as needed for pain, swelling, or fevers - Cold compresses - Seek in person evaluation if worsening  Follow Up Instructions: I discussed the assessment and treatment plan with the patient. The patient was provided an opportunity to ask questions and all were answered. The patient agreed with the plan and demonstrated an understanding of the instructions.  A copy of instructions were sent to the patient via MyChart unless otherwise noted below.     The patient was advised to call back or seek an in-person evaluation if the symptoms worsen or if the condition fails to improve as anticipated.  Time:  I spent 10 minutes with the patient via telehealth technology discussing the above problems/concerns.    Norma Loveless, PA-C

## 2022-09-09 ENCOUNTER — Other Ambulatory Visit (HOSPITAL_COMMUNITY): Payer: Self-pay

## 2022-09-09 ENCOUNTER — Ambulatory Visit: Payer: Commercial Managed Care - PPO | Admitting: Nurse Practitioner

## 2022-09-09 NOTE — Progress Notes (Deleted)
Assessment and Plan:  There are no diagnoses linked to this encounter.    Further disposition pending results of labs. Discussed med's effects and SE's.   Over 30 minutes of exam, counseling, chart review, and critical decision making was performed.   Future Appointments  Date Time Provider Department Center  09/09/2022  9:45 AM Raynelle Dick, NP [facility] None  12/03/2022  9:30 AM Adela Glimpse, NP [facility] None  08/19/2023  9:00 AM Adela Glimpse, NP [facility] None    ------------------------------------------------------------------------------------------------------------------   HPI There were no vitals taken for this visit. 62 y.o.female presents for  Past Medical History:  Diagnosis Date   Biliary dyskinesia 12/07/2011   GERD (gastroesophageal reflux disease)    High cholesterol    Hypertension    Hypothyroidism    Multinodular goiter      Allergies  Allergen Reactions   Avelox [Moxifloxacin Hcl In Nacl] Hives    Thrush, throat might have closed    Hydrochlorothiazide Itching    On hands and feet   Other Itching    All "Cillins", hives alao   Penicillins     Hives   Prednisone     Chest pains   Zostavax [Zoster Vaccine Live]    Codeine Hives, Itching and Rash   Levothyroxine Rash    Only to generic. Not to brand synthroid.    Semaglutide Rash   Sulfa Antibiotics Itching and Rash    Hands and feet itch and turn red    Current Outpatient Medications on File Prior to Visit  Medication Sig   aMILoride (MIDAMOR) 5 MG tablet Take 1 tablet (5 mg total) by mouth daily.   Ascorbic Acid (VITAMIN C) 1000 MG tablet Take 1,000 mg by mouth daily.   aspirin EC 81 MG tablet Take 81 mg by mouth daily. Swallow whole.   atorvastatin (LIPITOR) 10 MG tablet Take 1 tablet (10 mg total) by mouth daily for Cholesterol   buPROPion (WELLBUTRIN XL) 150 MG 24 hr tablet Take 1 tablet (150 mg total) by mouth every morning.   candesartan (ATACAND) 32 MG tablet Take 1  tablet (32 mg total) by mouth nightly for blood pressure   cetirizine (ZYRTEC) 10 MG tablet Take 10 mg by mouth daily.   Cholecalciferol (VITAMIN D-3) 5000 UNITS TABS Take 5,000 Units by mouth daily.   cyanocobalamin 2000 MCG tablet Take 2,000 mcg by mouth daily.   doxycycline (VIBRA-TABS) 100 MG tablet Take 1 tablet (100 mg total) by mouth 2 (two) times daily.   EPINEPHRINE 0.3 mg/0.3 mL IJ SOAJ injection USE AS DIRECTED   escitalopram (LEXAPRO) 20 MG tablet Take 1 tablet (20 mg total) by mouth daily for mood.   esomeprazole (NEXIUM) 40 MG capsule Take 1 capsule (40 mg total) by mouth daily to prevent heartburn & indigestion   fluconazole (DIFLUCAN) 150 MG tablet Take 1 tablet (150 mg total) by mouth daily.   fluticasone (FLONASE) 50 MCG/ACT nasal spray Place 1 spray into both nostrils daily.   Glucosamine-Chondroitin (GLUCOSAMINE CHONDR COMPLEX PO) Take 1,500 mg by mouth. 1500mg /1200mg   Actual dose   ketoconazole (NIZORAL) 2 % cream Apply 1 application topically 2 (two) times daily.   metFORMIN (GLUCOPHAGE-XR) 500 MG 24 hr tablet Take 2 tablets (1,000 mg total) by mouth 2 (two) times daily.   metroNIDAZOLE (METROCREAM) 0.75 % cream Apply topically 2 (two) times daily.   mupirocin ointment (BACTROBAN) 2 % Apply 1 Application topically 2 (two) times daily.   nystatin (MYCOSTATIN/NYSTOP) powder Apply daily after a  shower as needed   ondansetron (ZOFRAN) 8 MG tablet Take 1 tablet (8 mg total) by mouth up to 3 (three) times daily as needed for nausea/vomiting.   oxybutynin (DITROPAN) 5 MG tablet Take 1 tablet (5 mg total) by mouth 2 (two) times daily as needed (sweating).   phentermine (ADIPEX-P) 37.5 MG tablet Take 1 tablet (37.5 mg total) by mouth daily before breakfast.   propranolol (INDERAL) 80 MG tablet Take 1 tablet (80 mg total) by mouth in the morning and in the evening for blood pressure.   SYNTHROID 137 MCG tablet TAKE 1 TAB BY MOUTH DAILY ON AN EMPTY STOMACH WITH ONLY WATER FOR 30  MINUTES & NO ANTACID MEDS, CALCIUM, MAGNESIUM FOR 4 HOURS & AVOID BIOTIN   traMADol (ULTRAM) 50 MG tablet Take 1 tablet (50 mg total) by mouth every 12 (twelve) hours as needed (cough).   triamcinolone cream (KENALOG) 0.5 % Apply 1 application topically 2 (two) times daily.   No current facility-administered medications on file prior to visit.    ROS: all negative except above.   Physical Exam:  There were no vitals taken for this visit.  General Appearance: Well nourished, in no apparent distress. Eyes: PERRLA, EOMs, conjunctiva no swelling or erythema Sinuses: No Frontal/maxillary tenderness ENT/Mouth: Ext aud canals clear, TMs without erythema, bulging. No erythema, swelling, or exudate on post pharynx.  Tonsils not swollen or erythematous. Hearing normal.  Neck: Supple, thyroid normal.  Respiratory: Respiratory effort normal, BS equal bilaterally without rales, rhonchi, wheezing or stridor.  Cardio: RRR with no MRGs. Brisk peripheral pulses without edema.  Abdomen: Soft, + BS.  Non tender, no guarding, rebound, hernias, masses. Lymphatics: Non tender without lymphadenopathy.  Musculoskeletal: Full ROM, 5/5 strength, normal gait.  Skin: Warm, dry without rashes, lesions, ecchymosis.  Neuro: Cranial nerves intact. Normal muscle tone, no cerebellar symptoms. Sensation intact.  Psych: Awake and oriented X 3, normal affect, Insight and Judgment appropriate.     Raynelle Dick, NP 8:20 AM [facility]

## 2022-09-22 ENCOUNTER — Other Ambulatory Visit (HOSPITAL_COMMUNITY): Payer: Self-pay

## 2022-09-22 ENCOUNTER — Other Ambulatory Visit: Payer: Self-pay | Admitting: Nurse Practitioner

## 2022-09-22 ENCOUNTER — Other Ambulatory Visit: Payer: Self-pay

## 2022-09-22 DIAGNOSIS — I10 Essential (primary) hypertension: Secondary | ICD-10-CM

## 2022-09-22 MED ORDER — AMILORIDE HCL 5 MG PO TABS
5.0000 mg | ORAL_TABLET | Freq: Every day | ORAL | 1 refills | Status: DC
  Filled 2022-09-22: qty 90, 90d supply, fill #0
  Filled 2022-12-29: qty 90, 90d supply, fill #1

## 2022-10-15 ENCOUNTER — Other Ambulatory Visit: Payer: Self-pay

## 2022-10-15 ENCOUNTER — Other Ambulatory Visit (HOSPITAL_COMMUNITY): Payer: Self-pay

## 2022-10-15 MED ORDER — TRIAMCINOLONE ACETONIDE 0.025 % EX CREA
TOPICAL_CREAM | Freq: Two times a day (BID) | CUTANEOUS | 0 refills | Status: AC
  Filled 2022-10-15: qty 454, 10d supply, fill #0

## 2022-11-09 ENCOUNTER — Other Ambulatory Visit (HOSPITAL_COMMUNITY): Payer: Self-pay

## 2022-11-09 ENCOUNTER — Other Ambulatory Visit: Payer: Self-pay | Admitting: Internal Medicine

## 2022-11-09 ENCOUNTER — Other Ambulatory Visit: Payer: Self-pay | Admitting: Nurse Practitioner

## 2022-11-09 ENCOUNTER — Other Ambulatory Visit: Payer: Self-pay

## 2022-11-09 DIAGNOSIS — F3341 Major depressive disorder, recurrent, in partial remission: Secondary | ICD-10-CM

## 2022-11-09 DIAGNOSIS — E039 Hypothyroidism, unspecified: Secondary | ICD-10-CM

## 2022-11-09 MED ORDER — PHENTERMINE HCL 37.5 MG PO TABS
37.5000 mg | ORAL_TABLET | Freq: Every day | ORAL | 2 refills | Status: DC
Start: 2022-11-09 — End: 2023-02-15
  Filled 2022-11-09: qty 30, 30d supply, fill #0
  Filled 2022-12-08: qty 30, 30d supply, fill #1
  Filled 2023-01-15: qty 30, 30d supply, fill #2

## 2022-11-09 MED ORDER — BUPROPION HCL ER (XL) 150 MG PO TB24
150.0000 mg | ORAL_TABLET | Freq: Every morning | ORAL | 3 refills | Status: DC
Start: 2022-11-09 — End: 2023-09-05
  Filled 2022-11-09: qty 63, 63d supply, fill #0
  Filled 2022-11-09: qty 27, 27d supply, fill #0
  Filled 2023-02-07: qty 90, 90d supply, fill #1
  Filled 2023-05-27: qty 90, 90d supply, fill #2

## 2022-11-22 ENCOUNTER — Other Ambulatory Visit: Payer: Self-pay | Admitting: Nurse Practitioner

## 2022-11-22 ENCOUNTER — Other Ambulatory Visit (HOSPITAL_COMMUNITY): Payer: Self-pay

## 2022-11-22 MED ORDER — ESOMEPRAZOLE MAGNESIUM 40 MG PO CPDR
40.0000 mg | DELAYED_RELEASE_CAPSULE | Freq: Every day | ORAL | 1 refills | Status: DC
  Filled 2022-11-22: qty 90, 90d supply, fill #0
  Filled 2023-02-15: qty 90, 90d supply, fill #1

## 2022-11-22 MED ORDER — PROPRANOLOL HCL 80 MG PO TABS
80.0000 mg | ORAL_TABLET | Freq: Two times a day (BID) | ORAL | 1 refills | Status: DC
  Filled 2022-11-22: qty 180, 90d supply, fill #0
  Filled 2023-02-15: qty 180, 90d supply, fill #1

## 2022-12-03 ENCOUNTER — Ambulatory Visit: Payer: Commercial Managed Care - PPO | Admitting: Nurse Practitioner

## 2022-12-08 ENCOUNTER — Other Ambulatory Visit (HOSPITAL_COMMUNITY): Payer: Self-pay

## 2022-12-13 ENCOUNTER — Ambulatory Visit: Payer: Commercial Managed Care - PPO | Admitting: Physician Assistant

## 2022-12-13 ENCOUNTER — Encounter: Payer: Self-pay | Admitting: Physician Assistant

## 2022-12-13 DIAGNOSIS — M65331 Trigger finger, right middle finger: Secondary | ICD-10-CM

## 2022-12-13 MED ORDER — METHYLPREDNISOLONE ACETATE 40 MG/ML IJ SUSP
20.0000 mg | INTRAMUSCULAR | Status: AC | PRN
Start: 2022-12-13 — End: 2022-12-13
  Administered 2022-12-13: 20 mg

## 2022-12-13 MED ORDER — LIDOCAINE HCL 1 % IJ SOLN
0.5000 mL | INTRAMUSCULAR | Status: AC | PRN
Start: 2022-12-13 — End: 2022-12-13
  Administered 2022-12-13: .5 mL

## 2022-12-13 NOTE — Progress Notes (Signed)
   Procedure Note  Patient: Norma Sanchez             Date of Birth: 1960/04/22           MRN: [id number]             Visit Date: 12/13/2022  HPI: Ms. Garron is well-known to Dr. Eliberto Ivory service comes into day with new complaint.  Her right long finger is getting stuck.  It pops at times.  She has had no known injury.  She has tried ibuprofen and blue emu.  She is asking for injection.  Sent prior trigger fingers on left hand which she underwent surgery .   Review systems: Denies any fevers chills.  Right hand: No abnormal warmth erythema or effusion.  Triggering right long finger.  Tenderness where there is nodularity of the right long finger A1 pulley.  Procedures: Visit Diagnoses:  1. Trigger middle finger of right hand     Hand/UE Inj: R long A1 for trigger finger on 12/13/2022 3:07 PM Indications: pain, tendon swelling and therapeutic Details: volar approach Medications: 0.5 mL lidocaine 1 %; 20 mg methylPREDNISolone acetate 40 MG/ML   Plan: She will follow-up as needed.  Questions encouraged and answered at length.

## 2022-12-29 ENCOUNTER — Other Ambulatory Visit (HOSPITAL_COMMUNITY): Payer: Self-pay

## 2023-01-15 ENCOUNTER — Other Ambulatory Visit: Payer: Self-pay | Admitting: Nurse Practitioner

## 2023-01-17 ENCOUNTER — Other Ambulatory Visit (HOSPITAL_COMMUNITY): Payer: Self-pay

## 2023-01-17 ENCOUNTER — Other Ambulatory Visit: Payer: Self-pay

## 2023-01-17 MED ORDER — METFORMIN HCL ER 500 MG PO TB24
1000.0000 mg | ORAL_TABLET | Freq: Two times a day (BID) | ORAL | 95 refills | Status: DC
  Filled 2023-01-17: qty 360, 90d supply, fill #0
  Filled 2023-04-16: qty 360, 90d supply, fill #1

## 2023-02-07 ENCOUNTER — Other Ambulatory Visit: Payer: Self-pay | Admitting: Nurse Practitioner

## 2023-02-07 MED ORDER — CANDESARTAN CILEXETIL 32 MG PO TABS
32.0000 mg | ORAL_TABLET | Freq: Every evening | ORAL | 1 refills | Status: DC
  Filled 2023-02-07: qty 90, 90d supply, fill #0

## 2023-02-08 ENCOUNTER — Other Ambulatory Visit: Payer: Self-pay

## 2023-02-08 ENCOUNTER — Other Ambulatory Visit (HOSPITAL_COMMUNITY): Payer: Self-pay

## 2023-02-15 ENCOUNTER — Other Ambulatory Visit: Payer: Self-pay | Admitting: Nurse Practitioner

## 2023-02-15 ENCOUNTER — Other Ambulatory Visit: Payer: Self-pay

## 2023-02-17 ENCOUNTER — Other Ambulatory Visit (HOSPITAL_COMMUNITY): Payer: Self-pay

## 2023-02-17 MED ORDER — PHENTERMINE HCL 37.5 MG PO TABS
37.5000 mg | ORAL_TABLET | Freq: Every day | ORAL | 0 refills | Status: DC
  Filled 2023-02-17 (×2): qty 30, 30d supply, fill #0

## 2023-03-19 ENCOUNTER — Other Ambulatory Visit: Payer: Self-pay | Admitting: Nurse Practitioner

## 2023-03-19 DIAGNOSIS — Z6841 Body Mass Index (BMI) 40.0 and over, adult: Secondary | ICD-10-CM

## 2023-03-21 ENCOUNTER — Other Ambulatory Visit (HOSPITAL_COMMUNITY): Payer: Self-pay

## 2023-03-21 MED ORDER — PHENTERMINE HCL 37.5 MG PO TABS
37.5000 mg | ORAL_TABLET | Freq: Every day | ORAL | 0 refills | Status: DC
  Filled 2023-03-21 – 2023-04-16 (×2): qty 30, 30d supply, fill #0

## 2023-03-30 ENCOUNTER — Other Ambulatory Visit: Payer: Self-pay | Admitting: Nurse Practitioner

## 2023-03-30 ENCOUNTER — Other Ambulatory Visit (HOSPITAL_COMMUNITY): Payer: Self-pay

## 2023-03-30 ENCOUNTER — Other Ambulatory Visit: Payer: Self-pay

## 2023-03-30 DIAGNOSIS — I10 Essential (primary) hypertension: Secondary | ICD-10-CM

## 2023-03-30 MED ORDER — AMILORIDE HCL 5 MG PO TABS
5.0000 mg | ORAL_TABLET | Freq: Every day | ORAL | 1 refills | Status: DC
  Filled 2023-03-30: qty 90, 90d supply, fill #0
  Filled 2023-06-27: qty 90, 90d supply, fill #1

## 2023-03-31 ENCOUNTER — Other Ambulatory Visit (HOSPITAL_COMMUNITY): Payer: Self-pay

## 2023-04-03 ENCOUNTER — Emergency Department (HOSPITAL_COMMUNITY): Payer: Commercial Managed Care - PPO

## 2023-04-03 ENCOUNTER — Other Ambulatory Visit: Payer: Self-pay

## 2023-04-03 ENCOUNTER — Emergency Department (HOSPITAL_COMMUNITY)
Admission: EM | Admit: 2023-04-03 | Discharge: 2023-04-04 | Disposition: A | Discharge status: Home / Self Care | Payer: Commercial Managed Care - PPO | Attending: Emergency Medicine | Admitting: Emergency Medicine

## 2023-04-03 DIAGNOSIS — I10 Essential (primary) hypertension: Secondary | ICD-10-CM | POA: Diagnosis not present

## 2023-04-03 DIAGNOSIS — Z20822 Contact with and (suspected) exposure to covid-19: Secondary | ICD-10-CM | POA: Insufficient documentation

## 2023-04-03 DIAGNOSIS — R112 Nausea with vomiting, unspecified: Secondary | ICD-10-CM | POA: Diagnosis not present

## 2023-04-03 DIAGNOSIS — E669 Obesity, unspecified: Secondary | ICD-10-CM | POA: Diagnosis not present

## 2023-04-03 DIAGNOSIS — K5730 Diverticulosis of large intestine without perforation or abscess without bleeding: Secondary | ICD-10-CM | POA: Diagnosis not present

## 2023-04-03 DIAGNOSIS — D72829 Elevated white blood cell count, unspecified: Secondary | ICD-10-CM | POA: Diagnosis not present

## 2023-04-03 DIAGNOSIS — R109 Unspecified abdominal pain: Secondary | ICD-10-CM

## 2023-04-03 DIAGNOSIS — Z7982 Long term (current) use of aspirin: Secondary | ICD-10-CM | POA: Insufficient documentation

## 2023-04-03 DIAGNOSIS — N200 Calculus of kidney: Secondary | ICD-10-CM

## 2023-04-03 DIAGNOSIS — E039 Hypothyroidism, unspecified: Secondary | ICD-10-CM | POA: Insufficient documentation

## 2023-04-03 DIAGNOSIS — Z6841 Body Mass Index (BMI) 40.0 and over, adult: Secondary | ICD-10-CM | POA: Diagnosis not present

## 2023-04-03 DIAGNOSIS — N3000 Acute cystitis without hematuria: Secondary | ICD-10-CM | POA: Diagnosis not present

## 2023-04-03 DIAGNOSIS — R1032 Left lower quadrant pain: Secondary | ICD-10-CM | POA: Diagnosis not present

## 2023-04-03 DIAGNOSIS — N132 Hydronephrosis with renal and ureteral calculous obstruction: Secondary | ICD-10-CM | POA: Diagnosis not present

## 2023-04-03 LAB — LIPASE, BLOOD: Lipase: 26 U/L (ref 11–51)

## 2023-04-03 LAB — RESP PANEL BY RT-PCR (RSV, FLU A&B, COVID)  RVPGX2
Influenza A by PCR: NEGATIVE
Influenza B by PCR: NEGATIVE
Resp Syncytial Virus by PCR: NEGATIVE
SARS Coronavirus 2 by RT PCR: NEGATIVE

## 2023-04-03 LAB — CBC WITH DIFFERENTIAL/PLATELET
Abs Immature Granulocytes: 0.07 10*3/uL (ref 0.00–0.07)
Basophils Absolute: 0.1 10*3/uL (ref 0.0–0.1)
Basophils Relative: 0 %
Eosinophils Absolute: 0 10*3/uL (ref 0.0–0.5)
Eosinophils Relative: 0 %
HCT: 39.7 % (ref 36.0–46.0)
Hemoglobin: 13.1 g/dL (ref 12.0–15.0)
Immature Granulocytes: 0 %
Lymphocytes Relative: 6 %
Lymphs Abs: 1 10*3/uL (ref 0.7–4.0)
MCH: 29 pg (ref 26.0–34.0)
MCHC: 33 g/dL (ref 30.0–36.0)
MCV: 88 fL (ref 80.0–100.0)
Monocytes Absolute: 0.4 10*3/uL (ref 0.1–1.0)
Monocytes Relative: 2 %
Neutro Abs: 15.2 10*3/uL — ABNORMAL HIGH (ref 1.7–7.7)
Neutrophils Relative %: 92 %
Platelets: 316 10*3/uL (ref 150–400)
RBC: 4.51 MIL/uL (ref 3.87–5.11)
RDW: 12.9 % (ref 11.5–15.5)
WBC: 16.8 10*3/uL — ABNORMAL HIGH (ref 4.0–10.5)
nRBC: 0 % (ref 0.0–0.2)

## 2023-04-03 LAB — COMPREHENSIVE METABOLIC PANEL
ALT: 22 U/L (ref 0–44)
AST: 23 U/L (ref 15–41)
Albumin: 3.8 g/dL (ref 3.5–5.0)
Alkaline Phosphatase: 76 U/L (ref 38–126)
Anion gap: 12 (ref 5–15)
BUN: 25 mg/dL — ABNORMAL HIGH (ref 8–23)
CO2: 20 mmol/L — ABNORMAL LOW (ref 22–32)
Calcium: 10.4 mg/dL — ABNORMAL HIGH (ref 8.9–10.3)
Chloride: 103 mmol/L (ref 98–111)
Creatinine, Ser: 1.3 mg/dL — ABNORMAL HIGH (ref 0.44–1.00)
GFR, Estimated: 46 mL/min — ABNORMAL LOW (ref >60)
Glucose, Bld: 130 mg/dL — ABNORMAL HIGH (ref 70–99)
Potassium: 5 mmol/L (ref 3.5–5.1)
Sodium: 135 mmol/L (ref 135–145)
Total Bilirubin: 0.8 mg/dL (ref 0.0–1.2)
Total Protein: 7 g/dL (ref 6.5–8.1)

## 2023-04-03 MED ORDER — SODIUM CHLORIDE 0.9 % IV BOLUS
1000.0000 mL | Freq: Once | INTRAVENOUS | Status: AC
  Administered 2023-04-03: 1000 mL via INTRAVENOUS

## 2023-04-03 MED ORDER — OXYCODONE HCL 5 MG PO TABS
5.0000 mg | ORAL_TABLET | ORAL | 0 refills | Status: DC | PRN
  Filled 2023-04-03: qty 15, 3d supply, fill #0

## 2023-04-03 MED ORDER — TAMSULOSIN HCL 0.4 MG PO CAPS
0.4000 mg | ORAL_CAPSULE | Freq: Every day | ORAL | 0 refills | Status: AC
  Filled 2023-04-03: qty 14, 14d supply, fill #0

## 2023-04-03 MED ORDER — IOHEXOL 300 MG/ML  SOLN
100.0000 mL | Freq: Once | INTRAMUSCULAR | Status: AC | PRN
  Administered 2023-04-03: 80 mL via INTRAVENOUS

## 2023-04-03 MED ORDER — ONDANSETRON HCL 4 MG/2ML IJ SOLN
4.0000 mg | Freq: Once | INTRAMUSCULAR | Status: AC
Start: 2023-04-03 — End: 2023-04-03
  Administered 2023-04-03: 4 mg via INTRAVENOUS
  Filled 2023-04-03: qty 2

## 2023-04-03 MED ORDER — ACETAMINOPHEN 325 MG PO TABS
650.0000 mg | ORAL_TABLET | Freq: Four times a day (QID) | ORAL | 0 refills | Status: DC | PRN
  Filled 2023-04-03: qty 36, 5d supply, fill #0

## 2023-04-03 MED ORDER — HYDROMORPHONE HCL 1 MG/ML IJ SOLN
1.0000 mg | Freq: Once | INTRAMUSCULAR | Status: AC
  Administered 2023-04-03: 1 mg via INTRAVENOUS
  Filled 2023-04-03: qty 1

## 2023-04-03 MED ORDER — ONDANSETRON HCL 4 MG PO TABS
4.0000 mg | ORAL_TABLET | Freq: Three times a day (TID) | ORAL | 0 refills | Status: DC | PRN
  Filled 2023-04-03: qty 6, 2d supply, fill #0

## 2023-04-03 NOTE — ED Triage Notes (Signed)
 Pt c/o LLQ pain that began this evening, endorses N/V.

## 2023-04-03 NOTE — ED Provider Notes (Signed)
 [place] EMERGENCY DEPARTMENT AT [facility] Provider Note  CSN: [id number] Arrival date & time: 04/03/23 2025  Chief Complaint(s) Abdominal Pain  HPI Norma Sanchez is a 63 y.o. female with past medical history as below, significant for biliary dyskinesia, GERD, HLD, HTN, obesity who presents to the ED with complaint of LEFT sided  abdominal pain  Began around 5:00 this morning, sharp and stabbing pain, constant.  She had some nausea and did vomit.  Denies significant constipation.  No BRBPR or melena.  Emesis nonbloody nonbilious.  No fevers or chills.  No chest pain or dyspnea.  No rashes.  No similar symptoms in past, no trauma.  No urine changes.  No history of prior abdominal surgery  Past Medical History Past Medical History:  Diagnosis Date   Biliary dyskinesia 12/07/2011   GERD (gastroesophageal reflux disease)    High cholesterol    Hypertension    Hypothyroidism    Multinodular goiter    Patient Active Problem List   Diagnosis Date Noted   Complete right bundle branch block (RBBB) 07/31/2021   Primary hyperparathyroidism (HCC) 01/30/2021   Arthritis of carpometacarpal (CMC) joint of left thumb 12/23/2020   Vitamin D  deficiency 03/20/2014   Medication management 03/20/2014   Abnormal glucose 12/21/2013   Hot flashes, menopausal 12/21/2013   Chronic cough 02/01/2013   Hypertension    GERD (gastroesophageal reflux disease)    Hypothyroidism    Hyperlipidemia    Bilateral leg and foot pain 07/23/2011   Morbid obesity with BMI of 40.0-44.9, adult (HCC) 07/23/2011   Home Medication(s) Prior to Admission medications   Medication Sig Start Date End Date Taking? Authorizing Provider  acetaminophen  (TYLENOL ) 325 MG tablet Take 2 tablets (650 mg total) by mouth every 6 (six) hours as needed. 04/03/23  Yes Elnor Savant A, DO  ondansetron  (ZOFRAN ) 4 MG tablet Take 1 tablet (4 mg total) by mouth every 8 (eight) hours as needed for nausea or vomiting. 04/03/23  Yes Elnor Savant LABOR, DO  oxyCODONE  (ROXICODONE ) 5 MG immediate release tablet Take 1 tablet (5 mg total) by mouth every 4 (four) hours as needed for severe pain (pain score 7-10). 04/03/23  Yes Elnor Savant A, DO  tamsulosin  (FLOMAX ) 0.4 MG CAPS capsule Take 1 capsule (0.4 mg total) by mouth daily for 14 days. 04/03/23 04/17/23 Yes Elnor Savant A, DO  aMILoride  (MIDAMOR ) 5 MG tablet Take 1 tablet (5 mg total) by mouth daily. 03/30/23   Laurice President, NP  Ascorbic Acid (VITAMIN C) 1000 MG tablet Take 1,000 mg by mouth daily.    [provider]  aspirin  EC 81 MG tablet Take 81 mg by mouth daily. Swallow whole.    [provider]  atorvastatin  (LIPITOR) 10 MG tablet Take 1 tablet (10 mg total) by mouth daily for Cholesterol 08/04/22   Wilkinson, Dana E, NP  buPROPion  (WELLBUTRIN  XL) 150 MG 24 hr tablet Take 1 tablet (150 mg total) by mouth every morning. 11/09/22   Wilkinson, Dana E, NP  candesartan  (ATACAND ) 32 MG tablet Take 1 tablet (32 mg total) by mouth nightly for blood pressure 02/07/23   Cranford, Tonya, NP  cetirizine (ZYRTEC) 10 MG tablet Take 10 mg by mouth daily.    [provider]  Cholecalciferol (VITAMIN D -3) 5000 UNITS TABS Take 5,000 Units by mouth daily.    [provider]  cyanocobalamin  2000 MCG tablet Take 2,000 mcg by mouth daily.    [provider]  EPINEPHRINE  0.3 mg/0.3  mL IJ SOAJ injection USE AS DIRECTED 11/19/14   Tonita Fallow, MD  escitalopram  (LEXAPRO ) 20 MG tablet Take 1 tablet (20 mg total) by mouth daily for mood. 08/04/22   Wilkinson, Dana E, NP  esomeprazole  (NEXIUM ) 40 MG capsule Take 1 capsule (40 mg total) by mouth daily to prevent heartburn & indigestion 11/22/22   Wilkinson, Dana E, NP  fluconazole  (DIFLUCAN ) 150 MG tablet Take 1 tablet (150 mg total) by mouth daily. 08/31/21   Cranford, Tonya, NP  fluticasone  (FLONASE ) 50 MCG/ACT nasal spray Place 1 spray into both nostrils daily. 11/02/18   Craig Alan SAUNDERS, PA-C   Glucosamine-Chondroitin (GLUCOSAMINE CHONDR COMPLEX PO) Take 1,500 mg by mouth. 1500mg /1200mg   Actual dose    [provider]  ketoconazole  (NIZORAL ) 2 % cream Apply 1 application topically 2 (two) times daily. 05/18/19   Craig Alan SAUNDERS, PA-C  metFORMIN  (GLUCOPHAGE -XR) 500 MG 24 hr tablet Take 2 tablets (1,000 mg total) by mouth 2 (two) times daily. 01/17/23   Cranford, Tonya, NP  metroNIDAZOLE  (METROCREAM ) 0.75 % cream Apply topically 2 (two) times daily. 08/24/21   Cranford, Tonya, NP  mupirocin  ointment (BACTROBAN ) 2 % Apply 1 Application topically 2 (two) times daily. 09/08/22   Vivienne Delon HERO, PA-C  nystatin  (MYCOSTATIN /NYSTOP ) powder Apply daily after a shower as needed 08/24/21   Cranford, Tonya, NP  oxybutynin  (DITROPAN ) 5 MG tablet Take 1 tablet (5 mg total) by mouth 2 (two) times daily as needed (sweating). 07/27/22   Wilkinson, Dana E, NP  phentermine  (ADIPEX-P ) 37.5 MG tablet Take 1 tablet (37.5 mg total) by mouth daily before breakfast. 03/21/23   Cranford, Tonya, NP  propranolol  (INDERAL ) 80 MG tablet Take 1 tablet (80 mg total) by mouth in the morning and in the evening for blood pressure. 11/22/22   Wilkinson, Dana E, NP  SYNTHROID  137 MCG tablet TAKE 1 TABLET DAILY ON AN EMPTY STOMACH WITH ONLY WATER  FOR 30 MINUTES & NO ANTACID MEDS, CALCIUM , MAGNESIUM  FOR 4 HOURS & AVOID BIOTIN 11/09/22   Wilkinson, Dana E, NP  traMADol  (ULTRAM ) 50 MG tablet Take 1 tablet (50 mg total) by mouth every 12 (twelve) hours as needed (cough). 04/11/17   Collier, Amanda R, PA-C  triamcinolone  cream (KENALOG ) 0.5 % Apply 1 application topically 2 (two) times daily. 08/21/21   Jeanine Knee, NP                                                                                                                                    Past Surgical History Past Surgical History:  Procedure Laterality Date   ABDOMINAL HYSTERECTOMY  8 yrs ago   1 ovary spared   CARPAL TUNNEL RELEASE  yrs ago   both wrists    CATARACT EXTRACTION W/PHACO Right 05/04/2021   Procedure: CATARACT EXTRACTION PHACO AND INTRAOCULAR LENS PLACEMENT (IOC);  Surgeon: Harrie Agent, MD;  Location: [facility];  Service: Ophthalmology;  Laterality: Right;  CDE: 1.08   CATARACT EXTRACTION W/PHACO Left 05/18/2021   Procedure: CATARACT EXTRACTION PHACO AND INTRAOCULAR LENS PLACEMENT (IOC);  Surgeon: Harrie Agent, MD;  Location: [facility];  Service: Ophthalmology;  Laterality: Left;  CDE: 2.63   CHOLECYSTECTOMY  12/28/2011   Procedure: LAPAROSCOPIC CHOLECYSTECTOMY WITH INTRAOPERATIVE CHOLANGIOGRAM;  Surgeon: Krystal JINNY Russell, MD;  Location: [facility];  Service: General;  Laterality: N/A;  Laparoscopic cholecystectomy with attempted cholangiogram   KNEE SURGERY  arthroscopic, 3 yrs ago   right knee twice   radioactive iodine  to thyroid '  march 2013   TONSILLECTOMY  age 30   TUBAL LIGATION  yrs ago   Family History Family History  Problem Relation Age of Onset   Stroke Mother    Breast cancer Mother 22   Alzheimer's disease Mother    Stroke Father    Heart attack Father    AAA (abdominal aortic aneurysm) Father        rupture, smoker   Alcoholism Sister    Heart attack Brother 76   CAD Brother    Suicidality Maternal Grandmother    Suicidality Maternal Grandfather    Heart attack Paternal Grandmother        83s    Social History Social History   Tobacco Use   Smoking status: Never   Smokeless tobacco: Never  Vaping Use   Vaping status: Never Used  Substance Use Topics   Alcohol use: No   Drug use: No   Allergies Avelox [moxifloxacin hcl in nacl], Hydrochlorothiazide, Other, Penicillins, Prednisone, Zostavax [zoster vaccine live], Codeine, Levothyroxine , Semaglutide , and Sulfa antibiotics  Review of Systems Review of Systems  Constitutional:  Negative for chills and fever.  Respiratory:  Negative for chest tightness and shortness of breath.   Cardiovascular:  Negative for chest pain and palpitations.   Gastrointestinal:  Positive for abdominal pain, nausea and vomiting.  Genitourinary:  Negative for dysuria.  Musculoskeletal:  Negative for arthralgias.  Neurological:  Negative for headaches.  All other systems reviewed and are negative.   Physical Exam Vital Signs  I have reviewed the triage vital signs BP 132/75   Pulse 72   Temp 98.4 F (36.9 C) (Oral)   Resp 18   Ht 5' (1.524 m)   Wt 106.6 kg   SpO2 96%   BMI 45.90 kg/m  Physical Exam Vitals and nursing note reviewed.  Constitutional:      General: She is not in acute distress.    Appearance: Normal appearance. She is obese.  HENT:     Head: Normocephalic and atraumatic.     Right Ear: External ear normal.     Left Ear: External ear normal.     Nose: Nose normal.     Mouth/Throat:     Mouth: Mucous membranes are moist.  Eyes:     General: No scleral icterus.       Right eye: No discharge.        Left eye: No discharge.  Cardiovascular:     Rate and Rhythm: Normal rate and regular rhythm.     Pulses: Normal pulses.     Heart sounds: Normal heart sounds.  Pulmonary:     Effort: Pulmonary effort is normal. No respiratory distress.     Breath sounds: Normal breath sounds. No stridor.  Abdominal:     General: Abdomen is flat. There is no distension.     Palpations: Abdomen is soft.     Tenderness: There is abdominal tenderness in the left lower quadrant.  There is no guarding or rebound.  Musculoskeletal:     Cervical back: No rigidity.     Right lower leg: No edema.     Left lower leg: No edema.  Skin:    General: Skin is warm and dry.     Capillary Refill: Capillary refill takes less than 2 seconds.  Neurological:     Mental Status: She is alert.  Psychiatric:        Mood and Affect: Mood normal.        Behavior: Behavior normal. Behavior is cooperative.     ED Results and Treatments Labs (all labs ordered are listed, but only abnormal results are displayed) Labs Reviewed  COMPREHENSIVE METABOLIC  PANEL - Abnormal; Notable for the following components:      Result Value   CO2 20 (*)    Glucose, Bld 130 (*)    BUN 25 (*)    Creatinine, Ser 1.30 (*)    Calcium  10.4 (*)    GFR, Estimated 46 (*)    All other components within normal limits  CBC WITH DIFFERENTIAL/PLATELET - Abnormal; Notable for the following components:   WBC 16.8 (*)    Neutro Abs 15.2 (*)    All other components within normal limits  RESP PANEL BY RT-PCR (RSV, FLU A&B, COVID)  RVPGX2  LIPASE, BLOOD  URINALYSIS, ROUTINE W REFLEX MICROSCOPIC                                                                                                                          Radiology CT ABDOMEN PELVIS W CONTRAST Result Date: 04/03/2023 CLINICAL DATA:  Left lower quadrant pain, nausea and vomiting EXAM: CT ABDOMEN AND PELVIS WITH CONTRAST TECHNIQUE: Multidetector CT imaging of the abdomen and pelvis was performed using the standard protocol following bolus administration of intravenous contrast. RADIATION DOSE REDUCTION: This exam was performed according to the departmental dose-optimization program which includes automated exposure control, adjustment of the mA and/or kV according to patient size and/or use of iterative reconstruction technique. CONTRAST:  80mL OMNIPAQUE  IOHEXOL  300 MG/ML  SOLN COMPARISON:  None Available. FINDINGS: Lower chest: No acute pleural or parenchymal lung disease. Hepatobiliary: No focal liver abnormality is seen. Status post cholecystectomy. No biliary dilatation. Pancreas: Unremarkable. No pancreatic ductal dilatation or surrounding inflammatory changes. Spleen: Normal in size without focal abnormality. Adrenals/Urinary Tract: 12 mm obstructing left UPJ calculus, with mild left-sided hydronephrosis. Prominent left perinephric fat stranding may reflect superimposed infection. Nonobstructing 9 mm right renal calculus. No right-sided obstruction. The adrenals and bladder are unremarkable. Stomach/Bowel: No bowel  obstruction or ileus. Normal appendix right lower quadrant. Scattered diverticulosis of the distal colon without diverticulitis. No bowel wall thickening or inflammatory change. Vascular/Lymphatic: Aortic atherosclerosis. No enlarged abdominal or pelvic lymph nodes. Reproductive: Status post hysterectomy. No adnexal masses. Other: No free fluid or free intraperitoneal gas. No abdominal wall hernia. Musculoskeletal: No acute or destructive bony abnormalities. Reconstructed images demonstrate no additional findings. IMPRESSION: 1. Obstructing 12  mm left UPJ calculus, with mild left-sided hydronephrosis. Left perinephric fat stranding could reflect superimposed infection. 2. Nonobstructing 9 mm right renal calculus. 3. Distal colonic diverticulosis without diverticulitis. 4.  Aortic Atherosclerosis (ICD10-I70.0). Electronically Signed   By: Ozell Daring M.D.   On: 04/03/2023 23:04    Pertinent labs & imaging results that were available during my care of the patient were reviewed by me and considered in my medical decision making (see MDM for details).  Medications Ordered in ED Medications  ondansetron  (ZOFRAN ) injection 4 mg (4 mg Intravenous Given 04/03/23 2123)  HYDROmorphone  (DILAUDID ) injection 1 mg (1 mg Intravenous Given 04/03/23 2123)  sodium chloride  0.9 % bolus 1,000 mL (0 mLs Intravenous Stopped 04/03/23 2345)  iohexol  (OMNIPAQUE ) 300 MG/ML solution 100 mL (80 mLs Intravenous Contrast Given 04/03/23 2235)                                                                                                                                     Procedures Procedures  (including critical care time)  Medical Decision Making / ED Course    Medical Decision Making:    KHARTER SESTAK is a 63 y.o. female  with past medical history as below, significant for biliary dyskinesia, GERD, HLD, HTN, obesity who presents to the ED with complaint of LEFT sided  abdominal pain. The complaint involves an extensive  differential diagnosis and also carries with it a high risk of complications and morbidity.  Serious etiology was considered. Ddx includes but is not limited to: Differential diagnosis includes but is not exclusive to ectopic pregnancy, ovarian cyst, ovarian torsion, acute appendicitis, urinary tract infection, endometriosis, bowel obstruction, hernia, colitis, renal colic, gastroenteritis, volvulus etc.   Complete initial physical exam performed, notably the patient was in no significant distress, abdomen is not peritoneal.  No fever..    Reviewed and confirmed nursing documentation for past medical history, family history, social history.  Vital signs reviewed.   Clinical Course as of 04/03/23 2353  [place] Apr 03, 2023  2252 Creatinine(!): 1.30 Cr elev from prior, give ivf [SG]  2325 CTAP IMPRESSION: 1. Obstructing 12 mm left UPJ calculus, with mild left-sided hydronephrosis. Left perinephric fat stranding could reflect superimposed infection. [SG]    Clinical Course User Index [SG] Elnor Jayson LABOR, DO    Brief summary: 63 year old female history as above here with left abdominal pain since earlier this morning around 5 AM.  Vomit x 1, having some nausea intermittently.  No prior abdominal surgeries.  Abdomen is mildly tender palpation left lower quadrant  She has large UPJ stone, unlikely to pass spontaneously, likely require urology intervention.  Will check urine to ensure no concurrent infection.  She does have elevated creatinine.  Symptoms mildly improved w/  analgesic and antiemetic. Handoff to incoming EDP pending urinalysis and p.o. challenge.  If her pain is tolerable and she is able tolerate p.o. can likely follow-up with  urology in the office for outpatient lithotripsy. O/w would need to come in.               Additional history obtained: -Additional history obtained from family -External records from outside source obtained and reviewed including: Chart review  including previous notes, labs, imaging, consultation notes including  PDMP, primary care recommendation, home medications   Lab Tests: -I ordered, reviewed, and interpreted labs.   The pertinent results include:   Labs Reviewed  COMPREHENSIVE METABOLIC PANEL - Abnormal; Notable for the following components:      Result Value   CO2 20 (*)    Glucose, Bld 130 (*)    BUN 25 (*)    Creatinine, Ser 1.30 (*)    Calcium  10.4 (*)    GFR, Estimated 46 (*)    All other components within normal limits  CBC WITH DIFFERENTIAL/PLATELET - Abnormal; Notable for the following components:   WBC 16.8 (*)    Neutro Abs 15.2 (*)    All other components within normal limits  RESP PANEL BY RT-PCR (RSV, FLU A&B, COVID)  RVPGX2  LIPASE, BLOOD  URINALYSIS, ROUTINE W REFLEX MICROSCOPIC    Notable for elevated creatinine  EKG   EKG Interpretation Date/Time:    Ventricular Rate:    PR Interval:    QRS Duration:    QT Interval:    QTC Calculation:   R Axis:      Text Interpretation:           Imaging Studies ordered: I ordered imaging studies including CTAP I independently visualized the following imaging with scope of interpretation limited to determining acute life threatening conditions related to emergency care; findings noted above I independently visualized and interpreted imaging. I agree with the radiologist interpretation   Medicines ordered and prescription drug management: Meds ordered this encounter  Medications   ondansetron  (ZOFRAN ) injection 4 mg   HYDROmorphone  (DILAUDID ) injection 1 mg   sodium chloride  0.9 % bolus 1,000 mL   iohexol  (OMNIPAQUE ) 300 MG/ML solution 100 mL   tamsulosin  (FLOMAX ) 0.4 MG CAPS capsule    Sig: Take 1 capsule (0.4 mg total) by mouth daily for 14 days.    Dispense:  14 capsule    Refill:  0   oxyCODONE  (ROXICODONE ) 5 MG immediate release tablet    Sig: Take 1 tablet (5 mg total) by mouth every 4 (four) hours as needed for severe pain (pain  score 7-10).    Dispense:  15 tablet    Refill:  0   acetaminophen  (TYLENOL ) 325 MG tablet    Sig: Take 2 tablets (650 mg total) by mouth every 6 (six) hours as needed.    Dispense:  36 tablet    Refill:  0   ondansetron  (ZOFRAN ) 4 MG tablet    Sig: Take 1 tablet (4 mg total) by mouth every 8 (eight) hours as needed for nausea or vomiting.    Dispense:  6 tablet    Refill:  0    -I have reviewed the patients home medicines and have made adjustments as needed   Consultations Obtained: na   Cardiac Monitoring: Continuous pulse oximetry interpreted by myself, 100% on RA.    Social Determinants of Health:  Diagnosis or treatment significantly limited by social determinants of health: obesity    Reevaluation: After the interventions noted above, I reevaluated the patient and found that they have improved  Co morbidities that complicate the patient evaluation  Past Medical History:  Diagnosis  Date   Biliary dyskinesia 12/07/2011   GERD (gastroesophageal reflux disease)    High cholesterol    Hypertension    Hypothyroidism    Multinodular goiter       Dispostion: Disposition decision including need for hospitalization was considered, and patient disposition pending at time of sign out.    Final Clinical Impression(s) / ED Diagnoses Final diagnoses:  Nephrolithiasis  Abdominal pain, unspecified abdominal location        Elnor Jayson LABOR, DO 04/03/23 2353

## 2023-04-03 NOTE — ED Provider Notes (Signed)
  Provider Note MRN:  [id number]  Arrival date & time: 04/04/23    ED Course and Medical Decision Making  Assumed care of patient at sign-out or upon transfer.  Large kidney stone awaiting urinalysis.  1:30 AM update: Urinalysis showing some evidence to suggest infection.  Nitrite positive, large white blood cells.  Patient is well-appearing in no acute distress with normal vital signs, no fever.  Case discussed with Dr. Carolee of urology, given lack of systemic symptoms patient is appropriate for discharge with very close follow-up, can likely be seen in the office later today.  Providing dose of IV ceftriaxone  prior to departure.  We discussed the potential seriousness of this condition and patient expresses understanding that she will return to the emergency department with any fever or worsening of condition.  Procedures  Final Clinical Impressions(s) / ED Diagnoses     ICD-10-CM   1. Nephrolithiasis  N20.0     2. Abdominal pain, unspecified abdominal location  R10.9     3. Acute cystitis without hematuria  N30.00       ED Discharge Orders          Ordered    cefdinir  (OMNICEF ) 300 MG capsule  2 times daily        04/04/23 0134    oxyCODONE  (ROXICODONE ) 5 MG immediate release tablet  Every 4 hours PRN        04/04/23 0135    tamsulosin  (FLOMAX ) 0.4 MG CAPS capsule  Daily        04/03/23 2327    oxyCODONE  (ROXICODONE ) 5 MG immediate release tablet  Every 4 hours PRN        04/03/23 2327    acetaminophen  (TYLENOL ) 325 MG tablet  Every 6 hours PRN        04/03/23 2327    ondansetron  (ZOFRAN ) 4 MG tablet  Every 8 hours PRN        04/03/23 2327              Discharge Instructions      You were evaluated in the Emergency Department and after careful evaluation, we did not find any emergent condition requiring admission or further testing in the hospital.  Your exam/testing today is overall reassuring.  Symptoms seem to be due to a large kidney stone.  You also have  evidence of a urinary tract infection.  Very important that you follow-up closely with the urologist.  Call the office this morning to set up an appointment.  Take the cefdinir  antibiotic as directed.  Recommend Tylenol  1000 mg every 4-6 hours and/or Motrin 600 mg every 4-6 hours for pain.  You can use the oxycodone  medication for more significant pain.  Please return to the Emergency Department if you experience any worsening of your condition as we discussed.   Thank you for allowing us  to be a part of your care.      Ozell HERO. Theadore, MD [facility] Emergency Medicine [facility] [email or web address]    Theadore Ozell HERO, MD 04/04/23 [phone]

## 2023-04-04 ENCOUNTER — Other Ambulatory Visit: Payer: Self-pay

## 2023-04-04 ENCOUNTER — Other Ambulatory Visit (HOSPITAL_COMMUNITY): Payer: Self-pay

## 2023-04-04 LAB — URINALYSIS, ROUTINE W REFLEX MICROSCOPIC
Bilirubin Urine: NEGATIVE
Glucose, UA: NEGATIVE mg/dL
Ketones, ur: 5 mg/dL — AB
Nitrite: POSITIVE — AB
Protein, ur: NEGATIVE mg/dL
Specific Gravity, Urine: 1.036 — ABNORMAL HIGH (ref 1.005–1.030)
WBC, UA: >50 WBC/hpf (ref 0–5)
pH: 5 (ref 5.0–8.0)

## 2023-04-04 MED ORDER — CEFDINIR 300 MG PO CAPS
300.0000 mg | ORAL_CAPSULE | Freq: Two times a day (BID) | ORAL | 0 refills | Status: AC
  Filled 2023-04-04: qty 14, 7d supply, fill #0

## 2023-04-04 MED ORDER — OXYCODONE HCL 5 MG PO TABS
5.0000 mg | ORAL_TABLET | ORAL | 0 refills | Status: DC | PRN
  Filled 2023-04-04: qty 8, 2d supply, fill #0

## 2023-04-04 MED ORDER — HYDROMORPHONE HCL 1 MG/ML IJ SOLN
1.0000 mg | Freq: Once | INTRAMUSCULAR | Status: AC
  Administered 2023-04-04: 1 mg via INTRAVENOUS
  Filled 2023-04-04: qty 1

## 2023-04-04 MED ORDER — ONDANSETRON HCL 4 MG/2ML IJ SOLN
4.0000 mg | Freq: Once | INTRAMUSCULAR | Status: AC
  Administered 2023-04-04: 4 mg via INTRAVENOUS
  Filled 2023-04-04: qty 2

## 2023-04-04 MED ORDER — SODIUM CHLORIDE 0.9 % IV SOLN
2.0000 g | Freq: Once | INTRAVENOUS | Status: AC
  Administered 2023-04-04: 2 g via INTRAVENOUS
  Filled 2023-04-04: qty 20

## 2023-04-04 NOTE — Discharge Instructions (Addendum)
 You were evaluated in the Emergency Department and after careful evaluation, we did not find any emergent condition requiring admission or further testing in the hospital.  Your exam/testing today is overall reassuring.  Symptoms seem to be due to a large kidney stone.  You also have evidence of a urinary tract infection.  Very important that you follow-up closely with the urologist.  Call the office this morning to set up an appointment.  Take the cefdinir  antibiotic as directed.  Recommend Tylenol  1000 mg every 4-6 hours and/or Motrin 600 mg every 4-6 hours for pain.  You can use the oxycodone  medication for more significant pain.  Please return to the Emergency Department if you experience any worsening of your condition as we discussed.   Thank you for allowing us  to be a part of your care.

## 2023-04-05 ENCOUNTER — Other Ambulatory Visit: Payer: Self-pay | Admitting: Urology

## 2023-04-05 DIAGNOSIS — N201 Calculus of ureter: Secondary | ICD-10-CM | POA: Diagnosis not present

## 2023-04-06 ENCOUNTER — Other Ambulatory Visit: Payer: Self-pay

## 2023-04-06 ENCOUNTER — Encounter (HOSPITAL_COMMUNITY): Payer: Self-pay | Admitting: Urology

## 2023-04-06 NOTE — H&P (Signed)
63 year old female presented the ER on 04/03/2023 with left-sided abdominal pain as well as nausea and vomiting CT demonstrated a left 12 mm UPJ calculus and a nonobstructing 9 mm right renal pelvis calculus.   PMH: GERD, HLD, HTN, obesity, HTN, hypothyroidism, no Cars or pulm hx.  PSH: Abdominal hysterectomy cataracts, cholecystectomy tonsillectomy, tubal ligation   04/05/23:  Nephrolithiasis: seen on ER on 2/9 UA nitrite positive, L UPJ stone, R non obstructing stone, able to see on scrout film, stone to skin distance 14cm. Patient still having pain, only taking hydrocodone, minimal PO intake, no N/V. No Fevers. Patient currently on cefdinir.  04/07/23: here today for L URS w/ LL      ALLERGIES:  Avelox Cillins Codeine Hydrochlorothiazide Levothyroxine Prednisone Semaglutide Sulfa Drugs    MEDICATIONS: Metformin Hcl 500 mg tablet  Tamsulosin Hcl 0.4 mg capsule  Candesartan  Cefdinir 300 mg capsule  Escitalopram Oxalate 20 mg tablet  Esomeprazole Magnesium 20 mg capsule,delayed release  Ondansetron Hcl 4 mg tablet  Oxycodone Hcl 5 mg tablet  Phentermine Hcl 37.5 mg capsule  Synthroid 137 mcg tablet     GU PSH: Hysterectomy      NON-GU PSH: Carpal tunnel surgery Cataract surgery Remove Gallbladder Tonsillectomy            GU PMH: None     NON-GU PMH: Hypercholesterolemia Hypertension Hypothyroidism      FAMILY HISTORY: Breast Cancer - Runs in Family heart - Runs in Family     SOCIAL HISTORY: Marital Status: Married Preferred Language: English; Ethnicity: Not Hispanic Or Latino; Race: White Current Smoking Status: Patient has never smoked.  <DIV'  Tobacco Use Assessment Completed:  Used Tobacco in last 30 days?   Does not use smokeless tobacco. Has never drank.  Does not use drugs. Drinks 2 caffeinated drinks per day. Has not had a blood transfusion.      REVIEW OF SYSTEMS:      GU Review Female:  Patient reports get up at night to urinate and leakage of  urine. Patient denies frequent urination, hard to postpone urination, burning /pain with urination, stream starts and stops, trouble starting your stream, have to strain to urinate, and being pregnant.     Gastrointestinal (Upper):  Patient reports nausea. Patient denies vomiting and indigestion/ heartburn.     Gastrointestinal (Lower):  Patient denies diarrhea and constipation.     Constitutional:  Patient denies fever, night sweats, weight loss, and fatigue.     Skin:  Patient denies skin rash/ lesion and itching.     Eyes:  Patient denies blurred vision and double vision.     Ears/ Nose/ Throat:  Patient denies sore throat and sinus problems.     Hematologic/Lymphatic:  Patient denies swollen glands and easy bruising.     Cardiovascular:  Patient denies leg swelling and chest pains.     Respiratory:  Patient denies cough and shortness of breath.     Endocrine:  Patient denies excessive thirst.     Musculoskeletal:  Patient reports back pain. Patient denies joint pain.     Neurological:  Patient denies headaches and dizziness.     Psychologic:  Patient denies depression and anxiety.     Notes: uti       VITAL SIGNS:        04/05/2023 08:05 AM      BP 100/63 mmHg      Pulse 76 /min      Temperature 96.9 F / 36.0 C  MULTI-SYSTEM PHYSICAL EXAMINATION:       Constitutional: Well-nourished. No physical deformities. Normally developed. Good grooming.      Respiratory: No labored breathing, no use of accessory muscles.       Cardiovascular: Normal temperature, normal extremity pulses, no swelling, no varicosities.      Skin: No paleness, no jaundice, no cyanosis. No lesion, no ulcer, no rash.      Musculoskeletal: Normal gait and station of head and neck.              Complexity of Data:   Records Review:  Previous Patient Records  Urine Test Review:  Urinalysis  X-Ray Review: C.T. Abdomen/Pelvis: Reviewed Films. Reviewed Report. CT shows mild left hydronephrosis 1.2 cm UPJ stone  right sided nonobstructing stone left-sided stranding surrounding the kidney.    PROCEDURES:    Visit Complexity - G2211      Urinalysis w/Scope  Dipstick Dipstick Cont'd Micro  Color: Yellow Bilirubin: Neg mg/dL WBC/hpf: 6 - 16/XWR  Appearance: Clear Ketones: Trace mg/dL RBC/hpf: 0 - 2/hpf  Specific Gravity: 1.025 Blood: Trace ery/uL Bacteria: NS (Not Seen)  pH: 5.5 Protein: 1+ mg/dL Cystals: NS (Not Seen)  Glucose: Neg mg/dL Urobilinogen: 0.2 mg/dL Casts: NS (Not Seen)   Nitrites: Neg Trichomonas: Not Present   Leukocyte Esterase: 1+ leu/uL Mucous: Not Present    Epithelial Cells: 0 - 5/hpf    Yeast: NS (Not Seen)    Sperm: Not Present   Notes: Micro performed on unspun urine due to QNS     ASSESSMENT:     ICD-10 Details  1 GU:  Ureteral calculus - N20.1 Undiagnosed New Problem   PLAN:   Orders  Labs Urine Culture  Document  Letter(s):  Created for Patient: Clinical Summary   Notes:  Left-sided UPJ stone: Discussed options of management including trial of passage, ESWL, and ureteroscopy. Explained to the patient a trial of passage is not a great option due to stone size of 1.2 cm unlikely to pass. Discussed ESWL explained the patient that due to her stone stone to skin distance of greater than 12 cm there is a slightly higher rate of persistent stone. We also discussed you are ureteroscopy said this was the risk yes but it was the highest likelihood of stone free rate patient voiced her understanding would like to proceed with URS with laser lithotripsy.  Plan proceed today with L URS w/ LL.    We discussed the risk benefits and alternatives to ureteroscopy. This includes bleeding, infection, damage to surrounding structures including the urethra, bladder, ureter, and kidney. With these possible injuries resulting in need for intervention in the future. We discussed inability to remove all the stone and requiring follow-up ureteroscopy. We also discussed the possibility of  not being able to gain access to the kidney and the need for nephrostomy tube. Possibility of long-term stent was also discussed. The patient voiced their understanding and would like to proceed.

## 2023-04-06 NOTE — Progress Notes (Signed)
For Anesthesia: PCP - Dr. Oneta Rack but recently passed  Cardiologist - not currently  Chest x-ray - N/A EKG - 08/13/22 in [facility] Stress Test - N/A ECHO - N/A Cardiac Cath - N/A Pacemaker/ICD device last checked: N/A Pacemaker orders received: N/A Device Rep notified: N/A  Spinal Cord Stimulator: N/A  Sleep Study - N/A CPAP - N/A  Fasting Blood Sugar - N/A Checks Blood Sugar __N/A___ times a day Date and result of last Hgb A1c-  Last dose of GLP1 agonist- N/A GLP1 instructions: N/A  Last dose of SGLT-2 inhibitors- N/A SGLT-2 instructions:N/A  Blood Thinner Instructions: N/A Aspirin Instructions: 81 mg Last Dose: 04/03/23  Activity level: Can go up a flight of stairs and activities of daily living without stopping and without chest pain and/or shortness of breath    Anesthesia review: N/A  Patient denies shortness of breath, fever, cough and chest pain during pre op phone call.   Patient verbalized understanding of instructions reviewed via telephone.

## 2023-04-07 ENCOUNTER — Ambulatory Visit (HOSPITAL_COMMUNITY)
Admission: RE | Admit: 2023-04-07 | Discharge: 2023-04-07 | Disposition: A | Discharge status: Home / Self Care | Payer: Commercial Managed Care - PPO | Source: Ambulatory Visit | Attending: Urology | Admitting: Urology

## 2023-04-07 ENCOUNTER — Encounter (HOSPITAL_COMMUNITY): Payer: Self-pay | Admitting: Urology

## 2023-04-07 ENCOUNTER — Other Ambulatory Visit (HOSPITAL_COMMUNITY): Payer: Self-pay

## 2023-04-07 ENCOUNTER — Ambulatory Visit: Payer: Self-pay

## 2023-04-07 ENCOUNTER — Encounter (HOSPITAL_COMMUNITY)
Admission: RE | Disposition: A | Discharge status: Home / Self Care | Payer: Self-pay | Source: Ambulatory Visit | Attending: Urology

## 2023-04-07 ENCOUNTER — Ambulatory Visit (HOSPITAL_COMMUNITY): Payer: Commercial Managed Care - PPO | Admitting: Anesthesiology

## 2023-04-07 ENCOUNTER — Ambulatory Visit (HOSPITAL_COMMUNITY): Payer: Commercial Managed Care - PPO

## 2023-04-07 ENCOUNTER — Ambulatory Visit (HOSPITAL_BASED_OUTPATIENT_CLINIC_OR_DEPARTMENT_OTHER): Payer: Commercial Managed Care - PPO | Admitting: Anesthesiology

## 2023-04-07 ENCOUNTER — Other Ambulatory Visit: Payer: Self-pay

## 2023-04-07 DIAGNOSIS — E039 Hypothyroidism, unspecified: Secondary | ICD-10-CM | POA: Insufficient documentation

## 2023-04-07 DIAGNOSIS — Z6841 Body Mass Index (BMI) 40.0 and over, adult: Secondary | ICD-10-CM | POA: Diagnosis not present

## 2023-04-07 DIAGNOSIS — N132 Hydronephrosis with renal and ureteral calculous obstruction: Secondary | ICD-10-CM | POA: Insufficient documentation

## 2023-04-07 DIAGNOSIS — Z79899 Other long term (current) drug therapy: Secondary | ICD-10-CM | POA: Diagnosis not present

## 2023-04-07 DIAGNOSIS — E66813 Obesity, class 3: Secondary | ICD-10-CM | POA: Insufficient documentation

## 2023-04-07 DIAGNOSIS — K219 Gastro-esophageal reflux disease without esophagitis: Secondary | ICD-10-CM | POA: Diagnosis not present

## 2023-04-07 DIAGNOSIS — N201 Calculus of ureter: Secondary | ICD-10-CM

## 2023-04-07 DIAGNOSIS — I10 Essential (primary) hypertension: Secondary | ICD-10-CM | POA: Diagnosis not present

## 2023-04-07 HISTORY — DX: Prediabetes: R73.03

## 2023-04-07 HISTORY — DX: Fatty (change of) liver, not elsewhere classified: K76.0

## 2023-04-07 HISTORY — DX: Other specified postprocedural states: R11.2

## 2023-04-07 HISTORY — DX: Personal history of urinary calculi: Z87.442

## 2023-04-07 HISTORY — DX: Nausea with vomiting, unspecified: Z98.890

## 2023-04-07 HISTORY — DX: Unspecified viral hepatitis B without hepatic coma: B19.10

## 2023-04-07 HISTORY — DX: Unspecified malignant neoplasm of skin, unspecified: C44.90

## 2023-04-07 HISTORY — PX: CYSTOSCOPY/URETEROSCOPY/HOLMIUM LASER/STENT PLACEMENT: SHX6546

## 2023-04-07 HISTORY — DX: Carpal tunnel syndrome, bilateral upper limbs: G56.03

## 2023-04-07 SURGERY — CYSTOSCOPY/URETEROSCOPY/HOLMIUM LASER/STENT PLACEMENT
Anesthesia: General | Laterality: Left

## 2023-04-07 MED ORDER — SODIUM CHLORIDE 0.9 % IR SOLN
Status: DC | PRN
  Administered 2023-04-07: 3000 mL

## 2023-04-07 MED ORDER — EPHEDRINE SULFATE (PRESSORS) 50 MG/ML IJ SOLN
INTRAMUSCULAR | Status: DC | PRN
  Administered 2023-04-07: 5 mg via INTRAVENOUS
  Administered 2023-04-07: 20 mg via INTRAVENOUS

## 2023-04-07 MED ORDER — MIDAZOLAM HCL 2 MG/2ML IJ SOLN
INTRAMUSCULAR | Status: AC
  Filled 2023-04-07: qty 2

## 2023-04-07 MED ORDER — METHOCARBAMOL 750 MG PO TABS
750.0000 mg | ORAL_TABLET | Freq: Four times a day (QID) | ORAL | 0 refills | Status: AC
  Filled 2023-04-07: qty 20, 5d supply, fill #0

## 2023-04-07 MED ORDER — GLYCOPYRROLATE 0.2 MG/ML IJ SOLN
INTRAMUSCULAR | Status: DC | PRN
  Administered 2023-04-07: .4 mg via INTRAVENOUS

## 2023-04-07 MED ORDER — LIDOCAINE HCL (PF) 2 % IJ SOLN
INTRAMUSCULAR | Status: AC
  Filled 2023-04-07: qty 5

## 2023-04-07 MED ORDER — DROPERIDOL 2.5 MG/ML IJ SOLN: 0.6250 mg | Freq: Once | INTRAMUSCULAR | Status: AC | PRN

## 2023-04-07 MED ORDER — VANCOMYCIN HCL 1000 MG IV SOLR
INTRAVENOUS | Status: DC | PRN
  Administered 2023-04-07: 1000 mg via INTRAVENOUS

## 2023-04-07 MED ORDER — LACTATED RINGERS IV SOLN: INTRAVENOUS | Status: DC

## 2023-04-07 MED ORDER — ACETAMINOPHEN 500 MG PO TABS
1000.0000 mg | ORAL_TABLET | Freq: Once | ORAL | Status: AC
Start: 2023-04-07 — End: 2023-04-07
  Administered 2023-04-07: 1000 mg via ORAL

## 2023-04-07 MED ORDER — DROPERIDOL 2.5 MG/ML IJ SOLN
INTRAMUSCULAR | Status: AC
Start: 2023-04-07 — End: 2023-04-07
  Administered 2023-04-07: 0.625 mg via INTRAVENOUS
  Filled 2023-04-07: qty 2

## 2023-04-07 MED ORDER — DEXAMETHASONE SODIUM PHOSPHATE 10 MG/ML IJ SOLN
INTRAMUSCULAR | Status: DC | PRN
  Administered 2023-04-07: 8 mg via INTRAVENOUS

## 2023-04-07 MED ORDER — CHLORHEXIDINE GLUCONATE 0.12 % MT SOLN
15.0000 mL | Freq: Once | OROMUCOSAL | Status: AC
  Administered 2023-04-07: 15 mL via OROMUCOSAL

## 2023-04-07 MED ORDER — PROPOFOL 10 MG/ML IV BOLUS
INTRAVENOUS | Status: DC | PRN
  Administered 2023-04-07: 180 mg via INTRAVENOUS

## 2023-04-07 MED ORDER — PROPOFOL 10 MG/ML IV BOLUS
INTRAVENOUS | Status: AC
  Filled 2023-04-07: qty 20

## 2023-04-07 MED ORDER — HYOSCYAMINE SULFATE 0.125 MG PO TBDP
0.1250 mg | ORAL_TABLET | Freq: Four times a day (QID) | ORAL | 0 refills | Status: DC | PRN
  Filled 2023-04-07: qty 20, 5d supply, fill #0

## 2023-04-07 MED ORDER — PHENYLEPHRINE 80 MCG/ML (10ML) SYRINGE FOR IV PUSH (FOR BLOOD PRESSURE SUPPORT)
PREFILLED_SYRINGE | INTRAVENOUS | Status: AC
  Filled 2023-04-07: qty 10

## 2023-04-07 MED ORDER — PHENAZOPYRIDINE HCL 200 MG PO TABS
200.0000 mg | ORAL_TABLET | Freq: Three times a day (TID) | ORAL | 0 refills | Status: DC | PRN
  Filled 2023-04-07: qty 6, 2d supply, fill #0

## 2023-04-07 MED ORDER — GENTAMICIN SULFATE 40 MG/ML IJ SOLN
5.0000 mg/kg | INTRAVENOUS | Status: AC
  Administered 2023-04-07: 345.2 mg via INTRAVENOUS
  Filled 2023-04-07: qty 8.75

## 2023-04-07 MED ORDER — EPHEDRINE 5 MG/ML INJ
INTRAVENOUS | Status: AC
  Filled 2023-04-07: qty 5

## 2023-04-07 MED ORDER — VANCOMYCIN HCL IN DEXTROSE 1-5 GM/200ML-% IV SOLN
1000.0000 mg | INTRAVENOUS | Status: AC
  Administered 2023-04-07: 1000 mg via INTRAVENOUS
  Filled 2023-04-07: qty 200

## 2023-04-07 MED ORDER — LIDOCAINE HCL (CARDIAC) PF 100 MG/5ML IV SOSY
PREFILLED_SYRINGE | INTRAVENOUS | Status: DC | PRN
Start: 2023-04-07 — End: 2023-04-07
  Administered 2023-04-07: 100 mg via INTRAVENOUS

## 2023-04-07 MED ORDER — VASOPRESSIN 20 UNIT/ML IV SOLN
INTRAVENOUS | Status: AC
  Filled 2023-04-07: qty 1

## 2023-04-07 MED ORDER — FENTANYL CITRATE (PF) 100 MCG/2ML IJ SOLN
INTRAMUSCULAR | Status: DC | PRN
  Administered 2023-04-07 (×3): 50 ug via INTRAVENOUS

## 2023-04-07 MED ORDER — FENTANYL CITRATE PF 50 MCG/ML IJ SOSY: 25.0000 ug | PREFILLED_SYRINGE | INTRAMUSCULAR | Status: DC | PRN

## 2023-04-07 MED ORDER — FENTANYL CITRATE (PF) 100 MCG/2ML IJ SOLN
INTRAMUSCULAR | Status: AC
  Filled 2023-04-07: qty 2

## 2023-04-07 MED ORDER — OXYCODONE HCL 5 MG PO TABS: 5.0000 mg | ORAL_TABLET | Freq: Once | ORAL | Status: DC | PRN

## 2023-04-07 MED ORDER — ONDANSETRON HCL 4 MG/2ML IJ SOLN
INTRAMUSCULAR | Status: AC
  Filled 2023-04-07: qty 2

## 2023-04-07 MED ORDER — ORAL CARE MOUTH RINSE: 15.0000 mL | Freq: Once | OROMUCOSAL | Status: AC

## 2023-04-07 MED ORDER — DROPERIDOL 2.5 MG/ML IJ SOLN
INTRAMUSCULAR | Status: AC
  Filled 2023-04-07: qty 2

## 2023-04-07 MED ORDER — 0.9 % SODIUM CHLORIDE (POUR BTL) OPTIME
TOPICAL | Status: DC | PRN
  Administered 2023-04-07: 1000 mL

## 2023-04-07 MED ORDER — OXYCODONE HCL 5 MG/5ML PO SOLN: 5.0000 mg | Freq: Once | ORAL | Status: DC | PRN

## 2023-04-07 MED ORDER — DEXAMETHASONE SODIUM PHOSPHATE 10 MG/ML IJ SOLN
INTRAMUSCULAR | Status: AC
  Filled 2023-04-07: qty 1

## 2023-04-07 MED ORDER — PHENYLEPHRINE HCL (PRESSORS) 10 MG/ML IV SOLN
INTRAVENOUS | Status: DC | PRN
Start: 2023-04-07 — End: 2023-04-07
  Administered 2023-04-07: 80 ug via INTRAVENOUS

## 2023-04-07 MED ORDER — VASOPRESSIN 20 UNIT/ML IV SOLN
INTRAVENOUS | Status: DC | PRN
  Administered 2023-04-07: 2 [IU] via INTRAVENOUS

## 2023-04-07 MED ORDER — ONDANSETRON HCL 4 MG/2ML IJ SOLN
INTRAMUSCULAR | Status: DC | PRN
  Administered 2023-04-07: 4 mg via INTRAVENOUS

## 2023-04-07 SURGICAL SUPPLY — 19 items
BAG URO CATCHER STRL LF (MISCELLANEOUS) ×1 IMPLANT
BASKET ZERO TIP NITINOL 2.4FR (BASKET) IMPLANT
CATH URETL OPEN 5X70 (CATHETERS) ×1 IMPLANT
CLOTH BEACON ORANGE TIMEOUT ST (SAFETY) ×1 IMPLANT
EXTRACTOR STONE 1.7FRX115CM (UROLOGICAL SUPPLIES) IMPLANT
FIBER LASER MOSES 200 DFL (Laser) IMPLANT
FIBER LASER MOSES 365 DFL (Laser) IMPLANT
GLOVE SURG LX STRL 8.0 MICRO (GLOVE) ×1 IMPLANT
GOWN STRL REUS W/ TWL XL LVL3 (GOWN DISPOSABLE) ×1 IMPLANT
GUIDEWIRE ANG ZIPWIRE 038X150 (WIRE) IMPLANT
GUIDEWIRE STR DUAL SENSOR (WIRE) ×1 IMPLANT
KIT TURNOVER KIT A (KITS) IMPLANT
MANIFOLD NEPTUNE II (INSTRUMENTS) ×1 IMPLANT
PACK CYSTO (CUSTOM PROCEDURE TRAY) ×1 IMPLANT
SHEATH NAVIGATOR HD 12/14X28 (SHEATH) IMPLANT
SHEATH NAVIGATOR HD 12/14X36 (SHEATH) IMPLANT
STENT URET 6FRX24 CONTOUR (STENTS) IMPLANT
TUBING CONNECTING 10 (TUBING) ×1 IMPLANT
TUBING UROLOGY SET (TUBING) ×1 IMPLANT

## 2023-04-07 NOTE — Op Note (Signed)
Preoperative diagnosis: left ureteral calculus  Postoperative diagnosis: left ureteral calculus  Procedure:  Cystoscopy left ureteroscopy and stone removal Ureteroscopic laser lithotripsy left 40F x 24 ureteral stent placement   Surgeon: Dr. Vilma Prader  Anesthesia: General  Complications: None  Operative findings: Stone noted at the left UPJ fragmented until all fragments were than 2 mm removed.  No significant ureteral impaction.  EBL: Minimal  Specimens: left ureteral calculus  Disposition of specimens: [facility] for stone analysis  Indication: Norma Sanchez is a 63 y.o.   patient with a  left ureteral stone and associated left symptoms. After reviewing the management options for treatment, the patient elected to proceed with the above surgical procedure(s). We have discussed the potential benefits and risks of the procedure, side effects of the proposed treatment, the likelihood of the patient achieving the goals of the procedure, and any potential problems that might occur during the procedure or recuperation. Informed consent has been obtained.   Description of procedure:  The patient was taken to the operating room and general anesthesia was induced.  The patient was placed in the dorsal lithotomy position, prepped and draped in the usual sterile fashion, and preoperative antibiotics were administered. A preoperative time-out was performed.   Cystourethroscopy was performed.  The patient's urethra was examined and was normal.  Pan cystoscopy was performed and the bladder systematically examined in its entirety. There was no evidence for any bladder tumors, stones, or other mucosal pathology.    Attention then turned to the left ureteral orifice.   A 0.38 sensor guidewire was then advanced up the left ureter into the renal pelvis under fluoroscopic guidance. The 4.5 Fr semirigid ureteroscope was then advanced into the ureter next to the guidewire and  the calculus was identified.   The stone was then fragmented with the 200 micron holmium laser fiber on a setting of 0.6 and frequency of 6 Hz.   All stones were then removed from the ureter with an N-gage nitinol basket.  Reinspection of the ureter revealed no remaining visible stones or fragments.   With sensor wire stone placed a 6 x 24 double-J stent with strings was placed under fluoroscopic guidance proper placement within the kidney was confirmed with fluoroscopy proper placement by was confirmed with fluoroscopy.  The strings were left attached.  The bladder was then emptied and the procedure ended.  The patient appeared to tolerate the procedure well and without complications.  The patient was able to be awakened and transferred to the recovery unit in satisfactory condition.   Disposition: The tether of the stent was left on and taped to the mons pubis of the vagina.  Instructions for removing the stent have been provided to the patient. The patient has been scheduled for followup in 6 weeks with a renal ultrasound.

## 2023-04-07 NOTE — Anesthesia Postprocedure Evaluation (Signed)
Anesthesia Post Note  Patient: Norma Sanchez  Procedure(s) Performed: CYSTOSCOPY LEFT /URETEROSCOPY/HOLMIUM LASER/STENT PLACEMENT AND RETROGRADE PYELOGRAM (Left)     Patient location during evaluation: PACU Anesthesia Type: General Level of consciousness: awake and alert Pain management: pain level controlled Vital Signs Assessment: post-procedure vital signs reviewed and stable Respiratory status: spontaneous breathing, nonlabored ventilation and respiratory function stable Cardiovascular status: blood pressure returned to baseline Postop Assessment: no apparent nausea or vomiting Anesthetic complications: no   No notable events documented.  Last Vitals:  Vitals:   04/07/23 1414 04/07/23 1416  BP: 125/76 130/78  Pulse: 61 60  Resp:  19  Temp:  36.6 C  SpO2: 96% 93%    Last Pain:  Vitals:   04/07/23 1416  TempSrc: Oral  PainSc: 0-No pain                 Shanda Howells

## 2023-04-07 NOTE — Discharge Instructions (Signed)
DISCHARGE INSTRUCTIONS FOR Ureteroscopy and/or Ureteral Stent Placement  MEDICATIONS:  1.  Robaxin 2. Tamsulosin  3. Hyoscyamine  4. Pyridium   ACTIVITY:  1. No strenuous activity x 1week  2. No driving while on narcotic pain medications  3. Drink plenty of water  4. Continue to walk at home - it is normal to see blood in the urine while the stent is in place, so keep active, but don't over do it.  5. May return to work/school tomorrow or when you feel ready  6. You may experience some pain when urinating in the kidney on the side that was operated on while the stent is in place this is normal  WHAT IS NORMAL TO EXPERIENCE: It is normal to feel the urge to urinate while the stent is in place It is normal to have blood in your urine while the stent is in place  It sometimes can be normal to have pain in your kidney when you urinate   BATHING:  1. You can shower and we recommend daily showers  2. You have a string coming from your urethra: The stent string is attached to your ureteral stent. Do not pull on this until instructed   DIET: You may return to your normal diet immediately. Because of the raw surface of your bladder, alcohol, spicy foods, acid type foods and drinks with caffeine may cause irritation or frequency and should be used in moderation. To keep your urine flowing freely and to avoid constipation, drink plenty of fluids during the day ( 8-10 glasses ). Tip: Avoid cranberry juice because it is very acidic.  SIGNS/SYMPTOMS TO CALL:  Please call us if you have a fever greater than 101.5, uncontrolled nausea/vomiting, uncontrolled pain, dizziness, unable to urinate, bloody urine with clots greater than the size of a quarter, chest pain, shortness of breath, leg swelling, leg pain, redness around wound, drainage from wound, or any other concerns or questions.   You can reach [place] at [phone].   FOLLOW-UP:  1. You may remove your stent in on Monday 04/11/23. To do this go  into the shower, grab hold of the tether coming from your urethra. Pull the tether consistent motion until the stent is removed from your body. The stent will be around 10 inches long with a curl on either end.  2. You you have been set up for f/u in 6-8 weeks

## 2023-04-07 NOTE — Transfer of Care (Signed)
Immediate Anesthesia Transfer of Care Note  Patient: Norma Sanchez  Procedure(s) Performed: CYSTOSCOPY LEFT /URETEROSCOPY/HOLMIUM LASER/STENT PLACEMENT AND RETROGRADE PYELOGRAM (Left)  Patient Location: PACU  Anesthesia Type:General  Level of Consciousness: awake, alert , and oriented  Airway & Oxygen Therapy: Patient connected to nasal cannula oxygen and Patient connected to face mask  Post-op Assessment: Report given to RN  Post vital signs: stable  Last Vitals:  Vitals Value Taken Time  BP 137/84 04/07/23 1304  Temp 35.9 C 04/07/23 1304  Pulse 71 04/07/23 1309  Resp 20 04/07/23 1309  SpO2 100 % 04/07/23 1309  Vitals shown include unfiled device data.  Last Pain:  Vitals:   04/07/23 1304  TempSrc:   PainSc: 0-No pain      Patients Stated Pain Goal: 3 (04/06/23 1349)  Complications: No notable events documented.

## 2023-04-07 NOTE — Anesthesia Procedure Notes (Signed)
Procedure Name: LMA Insertion Date/Time: 04/07/2023 11:20 AM  Performed by: Micki Riley, CRNAPre-anesthesia Checklist: Patient identified, Emergency Drugs available, Suction available, Patient being monitored and Timeout performed Patient Re-evaluated:Patient Re-evaluated prior to induction Oxygen Delivery Method: Circle system utilized Preoxygenation: Pre-oxygenation with 100% oxygen Induction Type: IV induction LMA: LMA inserted Number of attempts: 1 Tube secured with: Tape Dental Injury: Teeth and Oropharynx as per pre-operative assessment

## 2023-04-07 NOTE — Anesthesia Preprocedure Evaluation (Addendum)
Anesthesia Evaluation  Patient identified by MRN, date of birth, ID band Patient awake    Reviewed: Allergy & Precautions, NPO status , Patient's Chart, lab work & pertinent test results  History of Anesthesia Complications (+) PONV and history of anesthetic complications  Airway Mallampati: II  TM Distance: >3 FB Neck ROM: Full    Dental no notable dental hx.    Pulmonary neg pulmonary ROS   Pulmonary exam normal        Cardiovascular hypertension, Pt. on medications Normal cardiovascular exam     Neuro/Psych negative neurological ROS     GI/Hepatic Neg liver ROS,GERD  Controlled,,  Endo/Other  Hypothyroidism  Class 3 obesity  Renal/GU negative Renal ROS     Musculoskeletal  (+) Arthritis ,    Abdominal   Peds  Hematology negative hematology ROS (+)   Anesthesia Other Findings Day of surgery medications reviewed with patient.  Reproductive/Obstetrics                              Anesthesia Physical Anesthesia Plan  ASA: 3  Anesthesia Plan: General   Post-op Pain Management: Tylenol PO (pre-op)*   Induction: Intravenous  PONV Risk Score and Plan: 4 or greater and Midazolam, Treatment may vary due to age or medical condition, Dexamethasone and Ondansetron  Airway Management Planned: LMA  Additional Equipment: None  Intra-op Plan:   Post-operative Plan: Extubation in OR  Informed Consent: I have reviewed the patients History and Physical, chart, labs and discussed the procedure including the risks, benefits and alternatives for the proposed anesthesia with the patient or authorized representative who has indicated his/her understanding and acceptance.     Dental advisory given  Plan Discussed with: CRNA  Anesthesia Plan Comments:          Anesthesia Quick Evaluation

## 2023-04-08 ENCOUNTER — Encounter (HOSPITAL_COMMUNITY): Payer: Self-pay | Admitting: Urology

## 2023-04-11 ENCOUNTER — Other Ambulatory Visit (HOSPITAL_COMMUNITY): Payer: Self-pay

## 2023-04-16 ENCOUNTER — Other Ambulatory Visit (HOSPITAL_COMMUNITY): Payer: Self-pay

## 2023-04-17 ENCOUNTER — Other Ambulatory Visit: Payer: Self-pay

## 2023-04-20 ENCOUNTER — Other Ambulatory Visit (HOSPITAL_COMMUNITY): Payer: Self-pay

## 2023-04-21 DIAGNOSIS — H52223 Regular astigmatism, bilateral: Secondary | ICD-10-CM | POA: Diagnosis not present

## 2023-04-30 ENCOUNTER — Other Ambulatory Visit: Payer: Self-pay

## 2023-05-01 ENCOUNTER — Other Ambulatory Visit: Payer: Self-pay

## 2023-05-02 ENCOUNTER — Encounter (HOSPITAL_COMMUNITY): Payer: Self-pay

## 2023-05-02 ENCOUNTER — Other Ambulatory Visit (HOSPITAL_COMMUNITY): Payer: Self-pay

## 2023-05-02 ENCOUNTER — Other Ambulatory Visit: Payer: Self-pay

## 2023-05-02 MED ORDER — PHENTERMINE HCL 37.5 MG PO TABS
37.5000 mg | ORAL_TABLET | Freq: Every day | ORAL | 0 refills | Status: DC
  Filled 2023-05-02 – 2023-05-27 (×2): qty 30, 30d supply, fill #0

## 2023-05-03 ENCOUNTER — Other Ambulatory Visit (HOSPITAL_COMMUNITY): Payer: Self-pay

## 2023-05-27 ENCOUNTER — Other Ambulatory Visit: Payer: Self-pay

## 2023-05-27 ENCOUNTER — Other Ambulatory Visit (HOSPITAL_COMMUNITY): Payer: Self-pay

## 2023-05-27 ENCOUNTER — Other Ambulatory Visit: Payer: Self-pay | Admitting: Internal Medicine

## 2023-05-27 ENCOUNTER — Other Ambulatory Visit: Payer: Self-pay | Admitting: Family

## 2023-05-27 MED ORDER — PROPRANOLOL HCL 80 MG PO TABS
80.0000 mg | ORAL_TABLET | Freq: Two times a day (BID) | ORAL | 1 refills | Status: DC
  Filled 2023-05-27: qty 180, 90d supply, fill #0

## 2023-05-27 MED ORDER — ESOMEPRAZOLE MAGNESIUM 40 MG PO CPDR
40.0000 mg | DELAYED_RELEASE_CAPSULE | Freq: Every day | ORAL | 1 refills | Status: DC
  Filled 2023-05-27 (×2): qty 90, 90d supply, fill #0

## 2023-05-28 ENCOUNTER — Other Ambulatory Visit (HOSPITAL_BASED_OUTPATIENT_CLINIC_OR_DEPARTMENT_OTHER): Payer: Self-pay

## 2023-06-07 ENCOUNTER — Other Ambulatory Visit (HOSPITAL_COMMUNITY): Payer: Self-pay

## 2023-06-07 ENCOUNTER — Ambulatory Visit: Payer: Self-pay | Admitting: Internal Medicine

## 2023-06-07 VITALS — BP 126/82 | HR 69 | Ht 60.0 in | Wt 229.4 lb

## 2023-06-07 DIAGNOSIS — R7303 Prediabetes: Secondary | ICD-10-CM | POA: Diagnosis not present

## 2023-06-07 DIAGNOSIS — E559 Vitamin D deficiency, unspecified: Secondary | ICD-10-CM

## 2023-06-07 DIAGNOSIS — Z6841 Body Mass Index (BMI) 40.0 and over, adult: Secondary | ICD-10-CM

## 2023-06-07 DIAGNOSIS — E039 Hypothyroidism, unspecified: Secondary | ICD-10-CM

## 2023-06-07 DIAGNOSIS — I10 Essential (primary) hypertension: Secondary | ICD-10-CM

## 2023-06-07 DIAGNOSIS — N951 Menopausal and female climacteric states: Secondary | ICD-10-CM

## 2023-06-07 DIAGNOSIS — E785 Hyperlipidemia, unspecified: Secondary | ICD-10-CM | POA: Diagnosis not present

## 2023-06-07 DIAGNOSIS — B3731 Acute candidiasis of vulva and vagina: Secondary | ICD-10-CM | POA: Diagnosis not present

## 2023-06-07 DIAGNOSIS — K2100 Gastro-esophageal reflux disease with esophagitis, without bleeding: Secondary | ICD-10-CM | POA: Diagnosis not present

## 2023-06-07 MED ORDER — FLUCONAZOLE 150 MG PO TABS
150.0000 mg | ORAL_TABLET | Freq: Once | ORAL | 0 refills | Status: AC
Start: 2023-06-07 — End: 2023-06-17
  Filled 2023-06-07: qty 2, 3d supply, fill #0

## 2023-06-07 NOTE — Assessment & Plan Note (Signed)
 Currently prescribed esomeprazole 40 mg daily.

## 2023-06-07 NOTE — Assessment & Plan Note (Signed)
 History of hypothyroidism secondary to multinodular goiter s/p RAI.  She is currently prescribed Synthroid 137 mcg daily.  Repeat thyroid studies ordered today.

## 2023-06-07 NOTE — Assessment & Plan Note (Signed)
 Symptoms are adequately controlled with Lexapro.

## 2023-06-07 NOTE — Assessment & Plan Note (Signed)
 Her current antihypertensive regimen consists of propranolol 80 mg twice daily, amiloride 5 mg daily, and candesartan 32 mg nightly.  BP is adequately controlled on this regimen.  No medication changes are indicated today.

## 2023-06-07 NOTE — Assessment & Plan Note (Signed)
 She endorses vaginal irritation and a history of yeast infections.  Diflucan 150 mg once prescribed today for treatment of yeast infection.  Can repeat treatment after 72 hours if no resolution of symptoms.

## 2023-06-07 NOTE — Assessment & Plan Note (Signed)
She is currently prescribed atorvastatin 10 mg daily. -Repeat lipid panel ordered today 

## 2023-06-07 NOTE — Assessment & Plan Note (Signed)
 Current weight 229 pounds.  BMI 44.8.  She is currently prescribed phentermine 37.5 mg daily.  PDMP reviewed and is appropriate.  Lifestyle modifications aimed at weight loss reinforced today.  Basic labs pending.

## 2023-06-07 NOTE — Assessment & Plan Note (Signed)
 A1c 6.0 on labs from June 2024.  Repeat A1c ordered today.

## 2023-06-07 NOTE — Progress Notes (Signed)
 New Patient Office Visit  Subjective    Patient ID: Norma Sanchez, female    DOB: [birth year]  Age: 63 y.o. MRN: [id number]  CC:  Chief Complaint  Patient presents with   Establish Care   Vaginitis    Yeast infection from antibiotics    HPI Norma Sanchez presents to establish care.  She is a 63 year old woman with a previously documented past medical history significant for HTN, hypothyroidism, hyperlipidemia, hot flashes, anxiety and depression, prediabetes, morbid obesity, and GERD.  She was previously followed by Dr. Oneta Sanchez in [place].  [name] reports feeling well today.  She endorses vaginal irritation after recently completing antibiotics and is concerned that she has a yeast infection.  She endorses a history of frequent yeast infections that are successfully treated with Diflucan.  She does not have any acute concerns to discuss aside from desiring to establish care.  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  She denies tobacco, alcohol, illicit drug use.  Family medical history is significant for dementia and CAD.  Chronic medical conditions and outstanding preventative care items discussed today are individually addressed in A/P below.   Outpatient Encounter Medications as of 06/07/2023  Medication Sig   acetaminophen (TYLENOL) 325 MG tablet Take 2 tablets (650 mg total) by mouth every 6 (six) hours as needed.   aMILoride (MIDAMOR) 5 MG tablet Take 1 tablet (5 mg total) by mouth daily.   Ascorbic Acid (VITAMIN C PO) Take 1 tablet by mouth in the morning.   aspirin EC 81 MG tablet Take 81 mg by mouth in the morning. Swallow whole.   atorvastatin (LIPITOR) 10 MG tablet Take 1 tablet (10 mg total) by mouth daily for Cholesterol (Patient taking differently: Take 10 mg by mouth at bedtime.)   buPROPion (WELLBUTRIN XL) 150 MG 24 hr tablet Take 1 tablet (150 mg total) by mouth every morning.   candesartan (ATACAND) 32 MG tablet Take 1 tablet (32 mg total) by  mouth nightly for blood pressure   cetirizine (ZYRTEC) 10 MG tablet Take 10 mg by mouth in the morning.   Cholecalciferol (VITAMIN D-3 PO) Take 1 tablet by mouth at bedtime.   escitalopram (LEXAPRO) 20 MG tablet Take 1 tablet (20 mg total) by mouth daily for mood. (Patient taking differently: Take 20 mg by mouth every evening.)   esomeprazole (NEXIUM) 40 MG capsule Take 1 capsule (40 mg total) by mouth daily to prevent heartburn & indigestion   fluconazole (DIFLUCAN) 150 MG tablet Take 1 tablet (150 mg total) by mouth once for 1 dose. repeat dose in 72 hours if symptoms persists   fluticasone (FLONASE) 50 MCG/ACT nasal spray Place 1 spray into both nostrils daily. (Patient taking differently: Place 1 spray into both nostrils at bedtime.)   metFORMIN (GLUCOPHAGE-XR) 500 MG 24 hr tablet Take 2 tablets (1,000 mg total) by mouth 2 (two) times daily.   metroNIDAZOLE (METROCREAM) 0.75 % cream Apply topically 2 (two) times daily. (Patient taking differently: Apply 1 Application topically 2 (two) times daily as needed (skin blemishes.).)   phentermine (ADIPEX-P) 37.5 MG tablet Take 1 tablet (37.5 mg total) by mouth daily before breakfast.   propranolol (INDERAL) 80 MG tablet Take 1 tablet (80 mg total) by mouth in the morning and in the evening for blood pressure.   SYNTHROID 137 MCG tablet TAKE 1 TABLET DAILY ON AN EMPTY STOMACH WITH ONLY WATER FOR 30 MINUTES & NO ANTACID MEDS, CALCIUM, MAGNESIUM FOR 4 HOURS &  AVOID BIOTIN   [DISCONTINUED] phenazopyridine (PYRIDIUM) 200 MG tablet Take 1 tablet (200 mg total) by mouth 3 (three) times daily as needed for up to 6 doses for pain.   [DISCONTINUED] Glucosamine-Chondroitin (GLUCOSAMINE CHONDR COMPLEX PO) Take 1 tablet by mouth in the morning and at bedtime. (Patient not taking: Reported on 06/07/2023)   [DISCONTINUED] hyoscyamine (ANASPAZ) 0.125 MG TBDP disintergrating tablet Place 1 tablet (0.125 mg total) under the tongue every 6 (six) hours as needed for up to 20  doses. (Patient not taking: Reported on 06/07/2023)   [DISCONTINUED] mupirocin ointment (BACTROBAN) 2 % Apply 1 Application topically 2 (two) times daily. (Patient not taking: Reported on 06/07/2023)   [DISCONTINUED] ondansetron (ZOFRAN) 4 MG tablet Take 1 tablet (4 mg total) by mouth every 8 (eight) hours as needed for nausea or vomiting. (Patient not taking: Reported on 06/07/2023)   [DISCONTINUED] oxybutynin (DITROPAN) 5 MG tablet Take 1 tablet (5 mg total) by mouth 2 (two) times daily as needed (sweating). (Patient not taking: Reported on 04/06/2023)   [DISCONTINUED] oxyCODONE (ROXICODONE) 5 MG immediate release tablet Take 1 tablet (5 mg total) by mouth every 4 (four) hours as needed for severe pain (pain score 7-10). (Patient not taking: Reported on 04/06/2023)   [DISCONTINUED] oxyCODONE (ROXICODONE) 5 MG immediate release tablet Take 1 tablet (5 mg total) by mouth every 4 (four) hours as needed for severe pain (pain score 7-10). (Patient not taking: Reported on 06/07/2023)   [DISCONTINUED] traMADol (ULTRAM) 50 MG tablet Take 1 tablet (50 mg total) by mouth every 12 (twelve) hours as needed (cough). (Patient not taking: Reported on 06/07/2023)   No facility-administered encounter medications on file as of 06/07/2023.   Past Medical History:  Diagnosis Date   Bilateral carpal tunnel syndrome    Biliary dyskinesia 12/07/2011   Fatty liver    GERD (gastroesophageal reflux disease)    Hepatitis B    High cholesterol    History of kidney stones    Hypertension    Hypothyroidism    Multinodular goiter    PONV (postoperative nausea and vomiting)    Pre-diabetes    Skin cancer    wrist and eyebrow   Past Surgical History:  Procedure Laterality Date   ABDOMINAL HYSTERECTOMY  8 yrs ago   1 ovary spared   CARPAL TUNNEL RELEASE  yrs ago   both wrists   CATARACT EXTRACTION W/PHACO Right 05/04/2021   Procedure: CATARACT EXTRACTION PHACO AND INTRAOCULAR LENS PLACEMENT (IOC);  Surgeon: Tarri Farm, MD;  Location: [facility];  Service: Ophthalmology;  Laterality: Right;  CDE: 1.08   CATARACT EXTRACTION W/PHACO Left 05/18/2021   Procedure: CATARACT EXTRACTION PHACO AND INTRAOCULAR LENS PLACEMENT (IOC);  Surgeon: Tarri Farm, MD;  Location: [facility];  Service: Ophthalmology;  Laterality: Left;  CDE: 2.63   CHOLECYSTECTOMY  12/28/2011   Procedure: LAPAROSCOPIC CHOLECYSTECTOMY WITH INTRAOPERATIVE CHOLANGIOGRAM;  Surgeon: Harlee Lichtenstein, MD;  Location: [facility];  Service: General;  Laterality: N/A;  Laparoscopic cholecystectomy with attempted cholangiogram   CYSTOSCOPY/URETEROSCOPY/HOLMIUM LASER/STENT PLACEMENT Left 04/07/2023   Procedure: CYSTOSCOPY LEFT /URETEROSCOPY/HOLMIUM LASER/STENT PLACEMENT AND RETROGRADE PYELOGRAM;  Surgeon: Thelbert Finner, MD;  Location: [facility];  Service: Urology;  Laterality: Left;  60 MINUTE CASE   KNEE SURGERY  arthroscopic, 3 yrs ago   right knee twice   radioactive iodine to thyroid'  04/2011   TONSILLECTOMY  age 49   TUBAL LIGATION  yrs ago   Family History  Problem Relation Age of Onset   Stroke  Mother    Breast cancer Mother 53   Alzheimer's disease Mother    Stroke Father    Heart attack Father    AAA (abdominal aortic aneurysm) Father        rupture, smoker   Alcoholism Sister    Heart attack Brother 8   CAD Brother    Suicidality Maternal Grandmother    Suicidality Maternal Grandfather    Heart attack Paternal Grandmother        45s   Social History   Socioeconomic History   Marital status: Married    Spouse name: Not on file   Number of children: Not on file   Years of education: Not on file   Highest education level: 12th grade  Occupational History   Not on file  Tobacco Use   Smoking status: Never   Smokeless tobacco: Never  Vaping Use   Vaping status: Never Used  Substance and Sexual Activity   Alcohol use: No   Drug use: No   Sexual activity: Yes    Partners: Male    Birth control/protection: Surgical  Other  Topics Concern   Not on file  Social History Narrative   Not on file   Social Drivers of Health   Financial Resource Strain: Low Risk  (06/07/2023)   Overall Financial Resource Strain (CARDIA)    Difficulty of Paying Living Expenses: Not hard at all  Food Insecurity: No Food Insecurity (06/07/2023)   Hunger Vital Sign    Worried About Running Out of Food in the Last Year: Never true    Ran Out of Food in the Last Year: Never true  Transportation Needs: No Transportation Needs (06/07/2023)   PRAPARE - Administrator, Civil Service (Medical): No    Lack of Transportation (Non-Medical): No  Physical Activity: Unknown (06/07/2023)   Exercise Vital Sign    Days of Exercise per Week: 0 days    Minutes of Exercise per Session: Not on file  Stress: No Stress Concern Present (06/07/2023)   [employer] of Occupational Health - Occupational Stress Questionnaire    Feeling of Stress : Not at all  Social Connections: Moderately Integrated (06/07/2023)   Social Connection and Isolation Panel [NHANES]    Frequency of Communication with Friends and Family: More than three times a week    Frequency of Social Gatherings with Friends and Family: Once a week    Attends Religious Services: More than 4 times per year    Active Member of [employer] or Organizations: No    Attends Engineer, structural: Not on file    Marital Status: Married  Catering manager Violence: Not on file   Review of Systems  Constitutional:  Negative for chills and fever.  HENT:  Negative for sore throat.   Respiratory:  Negative for cough and shortness of breath.   Cardiovascular:  Negative for chest pain, palpitations and leg swelling.  Gastrointestinal:  Negative for abdominal pain, blood in stool, constipation, diarrhea, nausea and vomiting.  Genitourinary:  Negative for dysuria and hematuria.       Vaginal irritation  Musculoskeletal:  Negative for myalgias.  Skin:  Negative for itching and rash.   Neurological:  Negative for dizziness and headaches.  Psychiatric/Behavioral:  Negative for depression and suicidal ideas.     Objective    BP 126/82   Pulse 69   Ht 5' (1.524 m)   Wt 229 lb 6.4 oz (104.1 kg)   SpO2 95%   BMI  44.80 kg/m   Physical Exam Vitals reviewed.  Constitutional:      General: She is not in acute distress.    Appearance: Normal appearance. She is obese. She is not toxic-appearing.  HENT:     Head: Normocephalic and atraumatic.     Right Ear: External ear normal.     Left Ear: External ear normal.     Nose: Nose normal. No congestion or rhinorrhea.     Mouth/Throat:     Mouth: Mucous membranes are moist.     Pharynx: Oropharynx is clear. No oropharyngeal exudate or posterior oropharyngeal erythema.  Eyes:     General: No scleral icterus.    Extraocular Movements: Extraocular movements intact.     Conjunctiva/sclera: Conjunctivae normal.     Pupils: Pupils are equal, round, and reactive to light.  Cardiovascular:     Rate and Rhythm: Normal rate and regular rhythm.     Pulses: Normal pulses.     Heart sounds: Normal heart sounds. No murmur heard.    No friction rub. No gallop.  Pulmonary:     Effort: Pulmonary effort is normal.     Breath sounds: Normal breath sounds. No wheezing, rhonchi or rales.  Abdominal:     General: Abdomen is flat. Bowel sounds are normal. There is no distension.     Palpations: Abdomen is soft.     Tenderness: There is no abdominal tenderness.  Musculoskeletal:        General: No swelling. Normal range of motion.     Cervical back: Normal range of motion.     Right lower leg: No edema.     Left lower leg: No edema.  Lymphadenopathy:     Cervical: No cervical adenopathy.  Skin:    General: Skin is warm and dry.     Capillary Refill: Capillary refill takes less than 2 seconds.     Coloration: Skin is not jaundiced.  Neurological:     General: No focal deficit present.     Mental Status: She is alert and oriented  to person, place, and time.  Psychiatric:        Mood and Affect: Mood normal.        Behavior: Behavior normal.   Last CBC Lab Results  Component Value Date   WBC 16.8 (H) 04/03/2023   HGB 13.1 04/03/2023   HCT 39.7 04/03/2023   MCV 88.0 04/03/2023   MCH 29.0 04/03/2023   RDW 12.9 04/03/2023   PLT 316 04/03/2023   Last metabolic panel Lab Results  Component Value Date   GLUCOSE 130 (H) 04/03/2023   NA 135 04/03/2023   K 5.0 04/03/2023   CL 103 04/03/2023   CO2 20 (L) 04/03/2023   BUN 25 (H) 04/03/2023   CREATININE 1.30 (H) 04/03/2023   GFRNONAA 46 (L) 04/03/2023   CALCIUM 10.4 (H) 04/03/2023   PROT 7.0 04/03/2023   ALBUMIN 3.8 04/03/2023   BILITOT 0.8 04/03/2023   ALKPHOS 76 04/03/2023   AST 23 04/03/2023   ALT 22 04/03/2023   ANIONGAP 12 04/03/2023   Last lipids Lab Results  Component Value Date   CHOL 160 08/13/2022   HDL 64 08/13/2022   LDLCALC 74 08/13/2022   TRIG 136 08/13/2022   CHOLHDL 2.5 08/13/2022   Last hemoglobin A1c Lab Results  Component Value Date   HGBA1C 6.0 (H) 08/13/2022   Last thyroid functions Lab Results  Component Value Date   TSH 0.87 08/13/2022   Last vitamin D Lab Results  Component Value Date   VD25OH 53 08/13/2022   Last vitamin B12 and Folate Lab Results  Component Value Date   VITAMINB12 334 08/13/2022   Assessment & Plan:   Problem List Items Addressed This Visit       Hypertension   Her current antihypertensive regimen consists of propranolol 80 mg twice daily, amiloride 5 mg daily, and candesartan 32 mg nightly.  BP is adequately controlled on this regimen.  No medication changes are indicated today.      Hot flashes, menopausal   Symptoms are adequately controlled with Lexapro.      GERD (gastroesophageal reflux disease)   Currently prescribed esomeprazole 40 mg daily.      Hypothyroidism   History of hypothyroidism secondary to multinodular goiter s/p RAI.  She is currently prescribed Synthroid 137  mcg daily.  Repeat thyroid studies ordered today.      Candida vaginitis - Primary   She endorses vaginal irritation and a history of yeast infections.  Diflucan 150 mg once prescribed today for treatment of yeast infection.  Can repeat treatment after 72 hours if no resolution of symptoms.      Morbid obesity with BMI of 40.0-44.9, adult (HCC)   Current weight 229 pounds.  BMI 44.8.  She is currently prescribed phentermine 37.5 mg daily.  PDMP reviewed and is appropriate.  Lifestyle modifications aimed at weight loss reinforced today.  Basic labs pending.      Hyperlipidemia   She is currently prescribed atorvastatin 10 mg daily.  Repeat lipid panel ordered today.      Prediabetes   A1c 6.0 on labs from June 2024.  Repeat A1c ordered today.      Return in about 6 months (around 12/07/2023).   Tobi Fortes, MD

## 2023-06-07 NOTE — Patient Instructions (Signed)
 It was a pleasure to see you today.  Thank you for giving us  the opportunity to be involved in your care.  Below is a brief recap of your visit and next steps.  We will plan to see you again in 6 months.  Summary You have established care today No medication changes were made Diflucan added for yeast infection Repeat labs ordered Follow up in 6 months

## 2023-06-08 ENCOUNTER — Encounter: Payer: Self-pay | Admitting: Internal Medicine

## 2023-06-08 ENCOUNTER — Other Ambulatory Visit: Payer: Self-pay | Admitting: Internal Medicine

## 2023-06-09 LAB — CBC WITH DIFFERENTIAL/PLATELET
Basophils Absolute: 0.2 10*3/uL (ref 0.0–0.2)
Basos: 1 %
EOS (ABSOLUTE): 0.7 10*3/uL — ABNORMAL HIGH (ref 0.0–0.4)
Eos: 5 %
Hematocrit: 40.3 % (ref 34.0–46.6)
Hemoglobin: 13.2 g/dL (ref 11.1–15.9)
Immature Grans (Abs): 0.1 10*3/uL (ref 0.0–0.1)
Immature Granulocytes: 1 %
Lymphocytes Absolute: 5.1 10*3/uL — ABNORMAL HIGH (ref 0.7–3.1)
Lymphs: 36 %
MCH: 28.3 pg (ref 26.6–33.0)
MCHC: 32.8 g/dL (ref 31.5–35.7)
MCV: 86 fL (ref 79–97)
Monocytes Absolute: 0.9 10*3/uL (ref 0.1–0.9)
Monocytes: 6 %
Neutrophils Absolute: 7.4 10*3/uL — ABNORMAL HIGH (ref 1.4–7.0)
Neutrophils: 51 %
Platelets: 395 10*3/uL (ref 150–450)
RBC: 4.67 x10E6/uL (ref 3.77–5.28)
RDW: 13.1 % (ref 11.7–15.4)
WBC: 14.4 10*3/uL — ABNORMAL HIGH (ref 3.4–10.8)

## 2023-06-09 LAB — TSH+FREE T4
Free T4: 1.89 ng/dL — ABNORMAL HIGH (ref 0.82–1.77)
TSH: 1.74 u[IU]/mL (ref 0.450–4.500)

## 2023-06-09 LAB — B12 AND FOLATE PANEL
Folate: 7.4 ng/mL (ref >3.0)
Vitamin B-12: 404 pg/mL (ref 232–1245)

## 2023-06-09 LAB — CMP14+EGFR
ALT: 21 IU/L (ref 0–32)
AST: 20 IU/L (ref 0–40)
Albumin: 4.7 g/dL (ref 3.9–4.9)
Alkaline Phosphatase: 119 IU/L (ref 44–121)
BUN/Creatinine Ratio: 27 (ref 12–28)
BUN: 31 mg/dL — ABNORMAL HIGH (ref 8–27)
Bilirubin Total: <0.2 mg/dL (ref 0.0–1.2)
CO2: 20 mmol/L (ref 20–29)
Calcium: 11.3 mg/dL — ABNORMAL HIGH (ref 8.7–10.3)
Chloride: 101 mmol/L (ref 96–106)
Creatinine, Ser: 1.14 mg/dL — ABNORMAL HIGH (ref 0.57–1.00)
Globulin, Total: 2.5 g/dL (ref 1.5–4.5)
Glucose: 73 mg/dL (ref 70–99)
Potassium: 5.9 mmol/L — ABNORMAL HIGH (ref 3.5–5.2)
Sodium: 137 mmol/L (ref 134–144)
Total Protein: 7.2 g/dL (ref 6.0–8.5)
eGFR: 54 mL/min/{1.73_m2} — ABNORMAL LOW (ref >59)

## 2023-06-09 LAB — VITAMIN D 25 HYDROXY (VIT D DEFICIENCY, FRACTURES): Vit D, 25-Hydroxy: 61.3 ng/mL (ref 30.0–100.0)

## 2023-06-09 LAB — HEMOGLOBIN A1C
Est. average glucose Bld gHb Est-mCnc: 120 mg/dL
Hgb A1c MFr Bld: 5.8 % — ABNORMAL HIGH (ref 4.8–5.6)

## 2023-06-09 LAB — LIPID PANEL
Chol/HDL Ratio: 2.8 ratio (ref 0.0–4.4)
Cholesterol, Total: 158 mg/dL (ref 100–199)
HDL: 56 mg/dL (ref >39)
LDL Chol Calc (NIH): 80 mg/dL (ref 0–99)
Triglycerides: 123 mg/dL (ref 0–149)
VLDL Cholesterol Cal: 22 mg/dL (ref 5–40)

## 2023-06-13 ENCOUNTER — Telehealth: Payer: Self-pay | Admitting: Internal Medicine

## 2023-06-13 NOTE — Telephone Encounter (Signed)
 Copied from CRM [id number]. Topic: Clinical - Medication Question >> Jun 13, 2023  4:52 PM Leory Rands wrote: Reason for CRM: Patient is calling to report that Dr. Kermit Ped had not sent a script in for Diflucan  for her. Please advise   [place] [facility] [address] [place] [address] Phone: [phone] Fax: [phone] Hours: M-F 7:30am-6pm

## 2023-06-14 ENCOUNTER — Other Ambulatory Visit (HOSPITAL_COMMUNITY): Payer: Self-pay

## 2023-06-14 ENCOUNTER — Other Ambulatory Visit: Payer: Self-pay | Admitting: Internal Medicine

## 2023-06-14 DIAGNOSIS — E039 Hypothyroidism, unspecified: Secondary | ICD-10-CM

## 2023-06-14 MED ORDER — SYNTHROID 137 MCG PO TABS
137.0000 ug | ORAL_TABLET | Freq: Every day | ORAL | 1 refills | Status: DC
  Filled 2023-06-14: qty 90, 90d supply, fill #0

## 2023-06-14 NOTE — Telephone Encounter (Signed)
 Copied from CRM [id number]. Topic: Clinical - Medication Refill >> Jun 14, 2023 10:10 AM Marissa P wrote: Most Recent Primary Care Visit:  Provider: DIXON, PHILLIP E  Department: [facility]  Visit Type: NEW PT - OFFICE VISIT  Date: 06/07/2023  Medication: SYNTHROID  137 MCG tablet  Has the patient contacted their pharmacy? Yes (Agent: If no, request that the patient contact the pharmacy for the refill. If patient does not wish to contact the pharmacy document the reason why and proceed with request.) (Agent: If yes, when and what did the pharmacy advise?)  Is this the correct pharmacy for this prescription? Yes If no, delete pharmacy and type the correct one.  This is the patient's preferred pharmacy:   [facility] [place], [place] - [address] [place] [address] Phone: [phone] Fax: [phone] [facility] FAX # [phone])   Has the prescription been filled recently? No  Is the patient out of the medication? No  Has the patient been seen for an appointment in the last year OR does the patient have an upcoming appointment? Yes  Can we respond through MyChart? Yes  Agent: Please be advised that Rx refills may take up to 3 business days. We ask that you follow-up with your pharmacy.

## 2023-06-14 NOTE — Telephone Encounter (Signed)
 Patient advised.

## 2023-06-16 ENCOUNTER — Other Ambulatory Visit (HOSPITAL_COMMUNITY): Payer: Self-pay

## 2023-06-17 ENCOUNTER — Other Ambulatory Visit: Payer: Self-pay | Admitting: Internal Medicine

## 2023-06-17 DIAGNOSIS — E039 Hypothyroidism, unspecified: Secondary | ICD-10-CM

## 2023-06-17 NOTE — Telephone Encounter (Signed)
 Copied from CRM [id number]. Topic: Clinical - Prescription Issue >> Jun 17, 2023  9:10 AM Rennis Case wrote: Reason for CRM: Pt received call from [facility] for SYNTHROID  137 MCG tablet. Patient called last week 06/14/23 and requested it be sent to pharmacy below:   [facility] [place], [place] - [address] [place] [address] Phone: [phone] Fax: [phone] [facility] FAX # [phone])   Patient requesting rx be sent to [facility] pharmacy as she can get it at a better price.

## 2023-06-27 ENCOUNTER — Other Ambulatory Visit: Payer: Self-pay

## 2023-06-27 ENCOUNTER — Other Ambulatory Visit: Payer: Self-pay | Admitting: Family

## 2023-06-27 DIAGNOSIS — Z6841 Body Mass Index (BMI) 40.0 and over, adult: Secondary | ICD-10-CM

## 2023-06-28 ENCOUNTER — Other Ambulatory Visit (HOSPITAL_COMMUNITY): Payer: Self-pay

## 2023-06-28 ENCOUNTER — Other Ambulatory Visit: Payer: Self-pay | Admitting: Internal Medicine

## 2023-06-28 MED ORDER — PHENTERMINE HCL 37.5 MG PO TABS
37.5000 mg | ORAL_TABLET | Freq: Every day | ORAL | 0 refills | Status: DC
  Filled 2023-06-28: qty 30, 30d supply, fill #0

## 2023-07-07 ENCOUNTER — Other Ambulatory Visit: Payer: Self-pay

## 2023-07-07 DIAGNOSIS — E039 Hypothyroidism, unspecified: Secondary | ICD-10-CM

## 2023-07-07 MED ORDER — SYNTHROID 137 MCG PO TABS: 137.0000 ug | ORAL_TABLET | Freq: Every day | ORAL | 1 refills | Status: DC

## 2023-08-01 ENCOUNTER — Other Ambulatory Visit (HOSPITAL_COMMUNITY): Payer: Self-pay

## 2023-08-01 ENCOUNTER — Other Ambulatory Visit: Payer: Self-pay

## 2023-08-01 ENCOUNTER — Other Ambulatory Visit: Payer: Self-pay | Admitting: Internal Medicine

## 2023-08-01 ENCOUNTER — Encounter: Payer: Self-pay | Admitting: Internal Medicine

## 2023-08-01 MED ORDER — METFORMIN HCL ER 500 MG PO TB24
1000.0000 mg | ORAL_TABLET | Freq: Two times a day (BID) | ORAL | 95 refills | Status: AC
  Filled 2023-08-01: qty 360, 90d supply, fill #0
  Filled 2023-11-08: qty 360, 90d supply, fill #1
  Filled 2024-01-31: qty 360, 90d supply, fill #2

## 2023-08-01 MED ORDER — ATORVASTATIN CALCIUM 10 MG PO TABS
10.0000 mg | ORAL_TABLET | Freq: Every day | ORAL | 3 refills | Status: AC
  Filled 2023-08-01: qty 90, 90d supply, fill #0
  Filled 2023-11-08: qty 90, 90d supply, fill #1
  Filled 2024-01-31: qty 90, 90d supply, fill #2

## 2023-08-01 MED ORDER — PHENTERMINE HCL 37.5 MG PO TABS
37.5000 mg | ORAL_TABLET | Freq: Every day | ORAL | 0 refills | Status: DC
Start: 2023-08-01 — End: 2023-09-01
  Filled 2023-08-01: qty 30, 30d supply, fill #0

## 2023-08-01 MED ORDER — CANDESARTAN CILEXETIL 32 MG PO TABS
32.0000 mg | ORAL_TABLET | Freq: Every evening | ORAL | 1 refills | Status: AC
  Filled 2023-08-01: qty 90, 90d supply, fill #0
  Filled 2023-12-06: qty 90, 90d supply, fill #1

## 2023-08-01 NOTE — Telephone Encounter (Signed)
 Refills sent to pharmacy.

## 2023-08-19 ENCOUNTER — Encounter: Payer: Commercial Managed Care - PPO | Admitting: Nurse Practitioner

## 2023-09-01 ENCOUNTER — Other Ambulatory Visit: Payer: Self-pay | Admitting: Internal Medicine

## 2023-09-01 ENCOUNTER — Encounter: Payer: Self-pay | Admitting: Physician Assistant

## 2023-09-02 ENCOUNTER — Other Ambulatory Visit (HOSPITAL_COMMUNITY): Payer: Self-pay

## 2023-09-02 MED ORDER — PHENTERMINE HCL 37.5 MG PO TABS
37.5000 mg | ORAL_TABLET | Freq: Every day | ORAL | 0 refills | Status: AC
  Filled 2023-09-02: qty 30, 30d supply, fill #0

## 2023-09-05 ENCOUNTER — Other Ambulatory Visit (HOSPITAL_COMMUNITY): Payer: Self-pay

## 2023-09-05 ENCOUNTER — Other Ambulatory Visit: Payer: Self-pay

## 2023-09-05 DIAGNOSIS — F3341 Major depressive disorder, recurrent, in partial remission: Secondary | ICD-10-CM

## 2023-09-05 DIAGNOSIS — K2100 Gastro-esophageal reflux disease with esophagitis, without bleeding: Secondary | ICD-10-CM

## 2023-09-05 DIAGNOSIS — I4510 Unspecified right bundle-branch block: Secondary | ICD-10-CM

## 2023-09-05 MED ORDER — ESCITALOPRAM OXALATE 20 MG PO TABS
20.0000 mg | ORAL_TABLET | Freq: Every evening | ORAL | 1 refills | Status: AC
  Filled 2023-09-05: qty 90, 90d supply, fill #0
  Filled 2023-12-06: qty 90, 90d supply, fill #1

## 2023-09-05 MED ORDER — PROPRANOLOL HCL 80 MG PO TABS
80.0000 mg | ORAL_TABLET | Freq: Two times a day (BID) | ORAL | 1 refills | Status: DC
  Filled 2023-09-05: qty 180, 90d supply, fill #0
  Filled 2023-12-06: qty 180, 90d supply, fill #1

## 2023-09-05 MED ORDER — ESOMEPRAZOLE MAGNESIUM 40 MG PO CPDR
40.0000 mg | DELAYED_RELEASE_CAPSULE | Freq: Every day | ORAL | 1 refills | Status: DC
  Filled 2023-09-05: qty 90, 90d supply, fill #0
  Filled 2023-12-06: qty 90, 90d supply, fill #1

## 2023-09-05 MED ORDER — BUPROPION HCL ER (XL) 150 MG PO TB24
150.0000 mg | ORAL_TABLET | Freq: Every morning | ORAL | 3 refills | Status: AC
  Filled 2023-09-05: qty 90, 90d supply, fill #0
  Filled 2023-12-06: qty 90, 90d supply, fill #1

## 2023-09-13 ENCOUNTER — Ambulatory Visit: Admitting: Orthopedic Surgery

## 2023-09-13 DIAGNOSIS — M65331 Trigger finger, right middle finger: Secondary | ICD-10-CM

## 2023-09-13 NOTE — Progress Notes (Signed)
 Norma Sanchez - 63 y.o. female MRN [id number]  Date of birth: 01-04-1961  Office Visit Note: Visit Date: 09/13/2023 PCP: Pcp, No Referred by: No ref. provider found  Subjective: No chief complaint on file.  HPI: Norma Sanchez is a pleasant 63 y.o. female who presents today for evaluation of right long finger trigger digit that is been present now for approximately 9 months.  She underwent injection in October of last year with temporary relief with recurrence of symptoms a few months later.  She does have a history notable for bilateral carpal tunnel release as well as left trigger thumb release 10 years prior.  She is having notable clicking locking of the right long finger on a regular basis, often require manual correction.  She is overall healthy and active at baseline.  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  Pertinent ROS were reviewed with the patient and found to be negative unless otherwise specified above in HPI.   Visit Reason:right middle trigger finger Duration of symptoms:8-9 months Hand dominance: right Occupation:works in hystology Diabetic: No Smoking: No Heart/Lung History:none Blood Thinners: none  Prior Testing/EMG:none Injections (Date):inj by gil on 12/13/22 that helped a couple months Treatments:none Prior Surgery:Had right CTR and left thumb trigger release  Assessment & Plan: Visit Diagnoses:  1. Trigger middle finger of right hand     Plan: Extensive discussion was had with the patient today regarding her right long finger trigger digit.  We discussed the etiology and pathophysiology of stenosing tenosynovitis.  We discussed conservative versus surgical treatment modalities.  From a conservative standpoint, we discussed activity modification, splinting, therapy and injections.  From a surgical standpoint, we discussed the possibility for trigger digit release as well as all risk and benefits  associated.  Given that she has undergone appropriate conservative treatments, patient is appropriate candidate for right long finger trigger digit release under local anesthesia.  She would prefer this to be done in the office setting which is appropriate.  Risks and benefits of the procedure were discussed, risks including but not limited to infection, bleeding, scarring, stiffness, nerve injury, tendon injury, vascular injury, recurrence of symptoms and need for subsequent operation.  We also discussed the appropriate postoperative protocol and timeframe for return to activities and function.  Patient expressed understanding.  Surgery will be performed next week.    Follow-up: No follow-ups on file.   Meds & Orders: No orders of the defined types were placed in this encounter.  No orders of the defined types were placed in this encounter.    Procedures: No procedures performed      Clinical History: No specialty comments available.  She reports that she has never smoked. She has never used smokeless tobacco.  Recent Labs    06/07/23 1133  HGBA1C 5.8*    Objective:   Vital Signs: There were no vitals taken for this visit.  Physical Exam  Gen: Well-appearing, in no acute distress; non-toxic CV: Regular Rate. Well-perfused. Warm.  Resp: Breathing unlabored on room air; no wheezing. Psych: Fluid speech in conversation; appropriate affect; normal thought process  Ortho Exam Right hand: - Palpable nodule at the A1 pulley of the long finger, associated tenderness - Notable clicking with deep flexion of the long finger, there is evidence of significant locking with deep flexion - Sensation intact distally, hand remains warm well-perfused   Imaging: No results found.  Past Medical/Family/Surgical/Social History: Medications &  Allergies reviewed per EMR, new medications updated. 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   Primary hyperparathyroidism (HCC) 01/30/2021   Arthritis of carpometacarpal (CMC) joint of left thumb 12/23/2020   Vitamin D  deficiency 03/20/2014   Medication management 03/20/2014   Abnormal glucose 12/21/2013   Hot flashes, menopausal 12/21/2013   Chronic cough 02/01/2013   Hypertension    GERD (gastroesophageal reflux disease)    Hypothyroidism    Hyperlipidemia    Bilateral leg and foot pain 07/23/2011   Morbid obesity with BMI of 40.0-44.9, adult (HCC) 07/23/2011   Past Medical History:  Diagnosis Date   Bilateral carpal tunnel syndrome    Biliary dyskinesia 12/07/2011   Fatty liver    GERD (gastroesophageal reflux disease)    Hepatitis B    High cholesterol    History of kidney stones    Hypertension    Hypothyroidism    Multinodular goiter    PONV (postoperative nausea and vomiting)    Pre-diabetes    Skin cancer    wrist and eyebrow   Family History  Problem Relation Age of Onset   Stroke Mother    Breast cancer Mother 23   Alzheimer's disease Mother    Stroke Father    Heart attack Father    AAA (abdominal aortic aneurysm) Father        rupture, smoker   Alcoholism Sister    Heart attack Brother 56   CAD Brother    Suicidality Maternal Grandmother    Suicidality Maternal Grandfather    Heart attack Paternal Grandmother        36s   Past Surgical History:  Procedure Laterality Date   ABDOMINAL HYSTERECTOMY  8 yrs ago   1 ovary spared   CARPAL TUNNEL RELEASE  yrs ago   both wrists   CATARACT EXTRACTION W/PHACO Right 05/04/2021   Procedure: CATARACT EXTRACTION PHACO AND INTRAOCULAR LENS PLACEMENT (IOC);  Surgeon: Harrie Agent, MD;  Location: [facility];  Service: Ophthalmology;  Laterality: Right;  CDE: 1.08   CATARACT EXTRACTION W/PHACO Left 05/18/2021   Procedure: CATARACT EXTRACTION PHACO AND INTRAOCULAR LENS PLACEMENT (IOC);  Surgeon: Harrie Agent, MD;  Location: [facility];  Service:  Ophthalmology;  Laterality: Left;  CDE: 2.63   CHOLECYSTECTOMY  12/28/2011   Procedure: LAPAROSCOPIC CHOLECYSTECTOMY WITH INTRAOPERATIVE CHOLANGIOGRAM;  Surgeon: Krystal JINNY Russell, MD;  Location: [facility];  Service: General;  Laterality: N/A;  Laparoscopic cholecystectomy with attempted cholangiogram   CYSTOSCOPY/URETEROSCOPY/HOLMIUM LASER/STENT PLACEMENT Left 04/07/2023   Procedure: CYSTOSCOPY LEFT /URETEROSCOPY/HOLMIUM LASER/STENT PLACEMENT AND RETROGRADE PYELOGRAM;  Surgeon: Shane Steffan BROCKS, MD;  Location: [facility];  Service: Urology;  Laterality: Left;  60 MINUTE CASE   KNEE SURGERY  arthroscopic, 3 yrs ago   right knee twice   radioactive iodine  to thyroid '  04/2011   TONSILLECTOMY  age 67   TUBAL LIGATION  yrs ago   Social History   Occupational History   Not on file  Tobacco Use   Smoking status: Never   Smokeless tobacco: Never  Vaping Use   Vaping status: Never Used  Substance and Sexual Activity   Alcohol use: No   Drug use: No   Sexual activity: Yes    Partners: Male    Birth control/protection: Surgical    Kameah Rawl Estela) Dala Breault, M.D. [facility], Hand Surgery

## 2023-09-15 ENCOUNTER — Other Ambulatory Visit: Payer: Self-pay

## 2023-09-15 DIAGNOSIS — M65331 Trigger finger, right middle finger: Secondary | ICD-10-CM

## 2023-09-20 ENCOUNTER — Telehealth: Payer: Self-pay | Admitting: Orthopedic Surgery

## 2023-09-20 ENCOUNTER — Ambulatory Visit: Payer: Self-pay

## 2023-09-20 ENCOUNTER — Ambulatory Visit
Admission: EM | Admit: 2023-09-20 | Discharge: 2023-09-20 | Disposition: A | Discharge status: Home / Self Care | Attending: Nurse Practitioner | Admitting: Nurse Practitioner

## 2023-09-20 ENCOUNTER — Ambulatory Visit: Admitting: Orthopedic Surgery

## 2023-09-20 ENCOUNTER — Encounter: Payer: Self-pay | Admitting: Emergency Medicine

## 2023-09-20 DIAGNOSIS — R42 Dizziness and giddiness: Secondary | ICD-10-CM

## 2023-09-20 MED ORDER — ONDANSETRON 4 MG PO TBDP: 4.0000 mg | ORAL_TABLET | Freq: Three times a day (TID) | ORAL | 0 refills | Status: DC | PRN

## 2023-09-20 MED ORDER — MECLIZINE HCL 12.5 MG PO TABS: 12.5000 mg | ORAL_TABLET | Freq: Three times a day (TID) | ORAL | 0 refills | Status: AC | PRN

## 2023-09-20 NOTE — Telephone Encounter (Signed)
 Tried calling pt to check on her. No answer and no voice mail

## 2023-09-20 NOTE — Telephone Encounter (Signed)
 FYI Only or Action Required?: FYI only for provider.  Patient was last seen in primary care on 06/07/2023 by Melvenia Manus BRAVO, MD.  Called Nurse Triage reporting Headache.  Symptoms began several days ago.  Interventions attempted: OTC medications: tylenol .  Symptoms are: stable. Pt has HA that comes and goes, Ringing in ears that precedes HA, and feels swimmy headed.  Triage Disposition: See Physician Within 24 Hours - PT will go to UC. Pt need a transfer of care appt.  Patient/caregiver understands and will follow disposition?: Yes                  Copied from CRM [id number]. Topic: Clinical - Red Word Triage >> Sep 20, 2023  9:39 AM Leonette P wrote: Red Word that prompted transfer to Nurse Triage: extreme headache, nausea vertigo Reason for Disposition  [1] MODERATE headache (e.g., interferes with normal activities) AND [2] present > 24 hours AND [3] unexplained  (Exceptions: Pain medicines not tried, typical migraine, or headache part of viral illness.)  Answer Assessment - Initial Assessment Questions 1. LOCATION: Where does it hurt?      Above her eyes and top of head - and chills on top of head 2. ONSET: When did the headache start? (e.g., minutes, hours, days)      Saturday had kidney stone pain, Then Sunday HA and swimmy headed 3. PATTERN: Does the pain come and go, or has it been constant since it started?     Comes and goes 4. SEVERITY: How bad is the pain? and What does it keep you from doing?  (e.g., Scale 1-10; mild, moderate, or severe)     Last night it was an 8/10 - now pain is 3-4/10 5. RECURRENT SYMPTOM: Have you ever had headaches before? If Yes, ask: When was the last time? and What happened that time?      no 6. CAUSE: What do you think is causing the headache?     no 7. MIGRAINE: Have you been diagnosed with migraine headaches? If Yes, ask: Is this headache similar?      no 8. HEAD INJURY: Has there been any recent injury  to your head?      no 9. OTHER SYMPTOMS: Do you have any other symptoms? (e.g., fever, stiff neck, eye pain, sore throat, cold symptoms)     Nausea, swimmy headed, Loud noise in ears prior to HA.  Protocols used: [facility]

## 2023-09-20 NOTE — Telephone Encounter (Signed)
 noted

## 2023-09-20 NOTE — ED Triage Notes (Signed)
 Nausea and when moving head feels dizzy since Sunday.  States she hears a loud roar when these spells happen.    States has had a constant headache since yesterday.

## 2023-09-20 NOTE — ED Provider Notes (Signed)
 [facility]    CSN: [id number] Arrival date & time: 09/20/23  1052      History   Chief Complaint No chief complaint on file.   HPI Norma Sanchez is a 63 y.o. female.   The history is provided by the patient.   Patient presents with a 2-day history of dizziness, nausea, and headache.  Patient states that her symptoms have improved somewhat today.  She states that she feels as if the room is spinning.  States symptoms worsened this morning when she was trying to lean forward.  Patient denies blurred vision, ear pain, vomiting, chest pain, abdominal pain, or rash.  Patient states that she hears a loud roaring nor is when the dizziness begins to occur.  States initially she thought the nausea was caused by her kidney stones.  Patient reports that she took Tylenol  for her headache last evening.  Past Medical History:  Diagnosis Date   Bilateral carpal tunnel syndrome    Biliary dyskinesia 12/07/2011   Fatty liver    GERD (gastroesophageal reflux disease)    Hepatitis B    High cholesterol    History of kidney stones    Hypertension    Hypothyroidism    Multinodular goiter    PONV (postoperative nausea and vomiting)    Pre-diabetes    Skin cancer    wrist and eyebrow    Patient Active Problem List   Diagnosis Date Noted   Prediabetes 06/07/2023   Candida vaginitis 06/07/2023   Complete right bundle branch block (RBBB) 07/31/2021   Primary hyperparathyroidism (HCC) 01/30/2021   Arthritis of carpometacarpal (CMC) joint of left thumb 12/23/2020   Vitamin D  deficiency 03/20/2014   Medication management 03/20/2014   Abnormal glucose 12/21/2013   Hot flashes, menopausal 12/21/2013   Chronic cough 02/01/2013   Hypertension    GERD (gastroesophageal reflux disease)    Hypothyroidism    Hyperlipidemia    Bilateral leg and foot pain 07/23/2011   Morbid obesity with BMI of 40.0-44.9, adult (HCC) 07/23/2011    Past Surgical History:  Procedure Laterality Date    ABDOMINAL HYSTERECTOMY  8 yrs ago   1 ovary spared   CARPAL TUNNEL RELEASE  yrs ago   both wrists   CATARACT EXTRACTION W/PHACO Right 05/04/2021   Procedure: CATARACT EXTRACTION PHACO AND INTRAOCULAR LENS PLACEMENT (IOC);  Surgeon: Harrie Agent, MD;  Location: [facility];  Service: Ophthalmology;  Laterality: Right;  CDE: 1.08   CATARACT EXTRACTION W/PHACO Left 05/18/2021   Procedure: CATARACT EXTRACTION PHACO AND INTRAOCULAR LENS PLACEMENT (IOC);  Surgeon: Harrie Agent, MD;  Location: [facility];  Service: Ophthalmology;  Laterality: Left;  CDE: 2.63   CHOLECYSTECTOMY  12/28/2011   Procedure: LAPAROSCOPIC CHOLECYSTECTOMY WITH INTRAOPERATIVE CHOLANGIOGRAM;  Surgeon: Krystal JINNY Russell, MD;  Location: [facility];  Service: General;  Laterality: N/A;  Laparoscopic cholecystectomy with attempted cholangiogram   CYSTOSCOPY/URETEROSCOPY/HOLMIUM LASER/STENT PLACEMENT Left 04/07/2023   Procedure: CYSTOSCOPY LEFT /URETEROSCOPY/HOLMIUM LASER/STENT PLACEMENT AND RETROGRADE PYELOGRAM;  Surgeon: Shane Steffan BROCKS, MD;  Location: [facility];  Service: Urology;  Laterality: Left;  60 MINUTE CASE   KNEE SURGERY  arthroscopic, 3 yrs ago   right knee twice   radioactive iodine  to thyroid '  04/2011   TONSILLECTOMY  age 12   TUBAL LIGATION  yrs ago    OB History   No obstetric history on file.      Home Medications    Prior to Admission medications   Medication Sig Start Date End Date Taking?  Authorizing Provider  acetaminophen  (TYLENOL ) 325 MG tablet Take 2 tablets (650 mg total) by mouth every 6 (six) hours as needed. 04/03/23   Elnor Jayson LABOR, DO  aMILoride  (MIDAMOR ) 5 MG tablet Take 1 tablet (5 mg total) by mouth daily. 03/30/23   Cranford, Tonya, NP  Ascorbic Acid (VITAMIN C PO) Take 1 tablet by mouth in the morning.    [provider]  aspirin EC 81 MG tablet Take 81 mg by mouth in the morning. Swallow whole.    [provider]  atorvastatin  (LIPITOR) 10 MG tablet Take 1 tablet (10 mg total)  by mouth daily for cholesterol 08/01/23   Melvenia Manus BRAVO, MD  buPROPion  (WELLBUTRIN  XL) 150 MG 24 hr tablet Take 1 tablet (150 mg total) by mouth every morning. 09/05/23   Bevely Doffing, FNP  candesartan  (ATACAND ) 32 MG tablet Take 1 tablet (32 mg total) by mouth nightly for blood pressure 08/01/23   Melvenia Manus BRAVO, MD  cetirizine (ZYRTEC) 10 MG tablet Take 10 mg by mouth in the morning.    [provider]  escitalopram  (LEXAPRO ) 20 MG tablet Take 1 tablet (20 mg total) by mouth every evening. 09/05/23   Bevely Doffing, FNP  esomeprazole  (NEXIUM ) 40 MG capsule Take 1 capsule (40 mg total) by mouth daily to prevent heartburn & indigestion 09/05/23   Bevely Doffing, FNP  fluticasone  (FLONASE ) 50 MCG/ACT nasal spray Place 1 spray into both nostrils daily. Patient taking differently: Place 1 spray into both nostrils at bedtime. 11/02/18   Craig Alan SAUNDERS, PA-C  metFORMIN  (GLUCOPHAGE -XR) 500 MG 24 hr tablet Take 2 tablets (1,000 mg total) by mouth 2 (two) times daily. 08/01/23   Melvenia Manus BRAVO, MD  phentermine  (ADIPEX-P ) 37.5 MG tablet Take 1 tablet (37.5 mg total) by mouth daily before breakfast. 09/02/23   Bevely Doffing, FNP  propranolol  (INDERAL ) 80 MG tablet Take 1 tablet (80 mg total) by mouth in the morning and in the evening for blood pressure. 09/05/23   Bevely Doffing, FNP  SYNTHROID  137 MCG tablet Take 1 tablet (137 mcg total) by mouth daily before breakfast. 07/07/23   Tobie Suzzane POUR, MD    Family History Family History  Problem Relation Age of Onset   Stroke Mother    Breast cancer Mother 44   Alzheimer's disease Mother    Stroke Father    Heart attack Father    AAA (abdominal aortic aneurysm) Father        rupture, smoker   Alcoholism Sister    Heart attack Brother 106   CAD Brother    Suicidality Maternal Grandmother    Suicidality Maternal Grandfather    Heart attack Paternal Grandmother        80s    Social History Social History   Tobacco Use   Smoking status:  Never   Smokeless tobacco: Never  Vaping Use   Vaping status: Never Used  Substance Use Topics   Alcohol use: No   Drug use: No     Allergies   Semaglutide , Avelox [moxifloxacin hcl in nacl], Hydrochlorothiazide, Other, Penicillins, Prednisone, Zostavax [zoster vaccine live], Codeine, Levothyroxine , and Sulfa antibiotics   Review of Systems Review of Systems Per HPI  Physical Exam Triage Vital Signs ED Triage Vitals  Encounter Vitals Group     BP 09/20/23 1116 113/78     Girls Systolic BP Percentile --      Girls Diastolic BP Percentile --      Boys Systolic BP Percentile --  Boys Diastolic BP Percentile --      Pulse Rate 09/20/23 1116 86     Resp 09/20/23 1116 18     Temp 09/20/23 1116 98.3 F (36.8 C)     Temp Source 09/20/23 1116 Oral     SpO2 09/20/23 1116 96 %     Weight --      Height --      Head Circumference --      Peak Flow --      Pain Score 09/20/23 1119 2     Pain Loc --      Pain Education --      Exclude from Growth Chart --    Orthostatic VS for the past 24 hrs:  BP- Lying Pulse- Lying BP- Sitting Pulse- Sitting BP- Standing at 0 minutes Pulse- Standing at 0 minutes  09/20/23 1122 113/81 79 108/76 82 95/69 85    Updated Vital Signs BP 113/78 (BP Location: Right Arm)   Pulse 86   Temp 98.3 F (36.8 C) (Oral)   Resp 18   SpO2 96%   Visual Acuity Right Eye Distance:   Left Eye Distance:   Bilateral Distance:    Right Eye Near:   Left Eye Near:    Bilateral Near:     Physical Exam Vitals and nursing note reviewed.  Constitutional:      General: She is not in acute distress.    Appearance: Normal appearance.  HENT:     Head: Normocephalic.     Right Ear: Tympanic membrane, ear canal and external ear normal.     Left Ear: Tympanic membrane, ear canal and external ear normal.     Nose: Nose normal.     Mouth/Throat:     Mouth: Mucous membranes are moist.  Eyes:     Extraocular Movements: Extraocular movements intact.      Conjunctiva/sclera: Conjunctivae normal.     Pupils: Pupils are equal, round, and reactive to light.  Cardiovascular:     Rate and Rhythm: Normal rate and regular rhythm.     Pulses: Normal pulses.     Heart sounds: Normal heart sounds.  Pulmonary:     Effort: Pulmonary effort is normal. No respiratory distress.     Breath sounds: Normal breath sounds. No stridor. No wheezing, rhonchi or rales.  Abdominal:     General: Bowel sounds are normal.     Palpations: Abdomen is soft.     Tenderness: There is no abdominal tenderness.  Musculoskeletal:     Cervical back: Normal range of motion.  Skin:    General: Skin is warm and dry.  Neurological:     General: No focal deficit present.     Mental Status: She is alert and oriented to person, place, and time.     GCS: GCS eye subscore is 4. GCS verbal subscore is 5. GCS motor subscore is 6.     Cranial Nerves: Cranial nerves 2-12 are intact.     Sensory: Sensation is intact.     Coordination: Coordination is intact.     Gait: Gait is intact.  Psychiatric:        Mood and Affect: Mood normal.        Behavior: Behavior normal.      UC Treatments / Results  Labs (all labs ordered are listed, but only abnormal results are displayed) Labs Reviewed - No data to display  EKG   Radiology No results found.  Procedures Procedures (including critical care time)  Medications Ordered in UC Medications - No data to display  Initial Impression / Assessment and Plan / UC Course  I have reviewed the triage vital signs and the nursing notes.  Pertinent labs & imaging results that were available during my care of the patient were reviewed by me and considered in my medical decision making (see chart for details).  Patient's neurological exam is intact, she is well-appearing, and is in no acute distress.  Patient reports improvement of dizziness.  States that she feels as if the room is spinning.  Symptoms consistent with vertigo.  Will  treat with meclizine  12.5 mg and ondansetron  4 mg ODT for nausea.  Supportive care recommendations were provided and discussed with the patient to include fluids, rest, continuing over-the-counter analgesics, and avoiding sudden movement.  Patient was given strict ER follow-up precautions.  Patient was advised if symptoms fail to improve with this treatment, recommend follow-up with her PCP.  Patient was in agreement with this plan of care and verbalizes understanding.  All questions were answered.  Patient stable for discharge.   Final Clinical Impressions(s) / UC Diagnoses   Final diagnoses:  None   Discharge Instructions   None    ED Prescriptions   None    PDMP not reviewed this encounter.   Gilmer Etta PARAS, NP 09/20/23 1139

## 2023-09-20 NOTE — Telephone Encounter (Signed)
Patient advised to go to urgent care

## 2023-09-20 NOTE — Telephone Encounter (Signed)
 Patient left a message to cancel in office procedure for today. Patient's PCP is sending her to Urgent Care because they think she may have Vertigo.

## 2023-09-20 NOTE — Discharge Instructions (Signed)
 Take medication as prescribed.  As discussed, the medication will cause drowsiness.  No driving, operate heavy equipment, or drinking alcohol when taking the medication. Increase fluids and allow for plenty of rest. You may continue over-the-counter Tylenol  as needed for headache pain or discomfort. Avoid sudden movement.  When you get up in the morning, sit on side of the bed before beginning to ambulate. Avoid certain positions that may worsen your dizziness. Go to the emergency department immediately if you experience worsening headaches, dizziness, become unable to ambulate, or other concerns. If symptoms fail to improve with this treatment, recommend follow-up with your primary care physician for further evaluation. Follow-up as needed.

## 2023-09-21 NOTE — Telephone Encounter (Signed)
 I called and talked to the pt. She is having a horrible headache and is on her way to the hospital. She will cb to r/s once better

## 2023-09-22 ENCOUNTER — Ambulatory Visit: Payer: Self-pay

## 2023-09-22 NOTE — Telephone Encounter (Signed)
 FYI Only or Action Required?: FYI only for provider.  Patient was last seen in primary care on 06/07/2023 by Melvenia Manus BRAVO, MD.  Called Nurse Triage reporting No chief complaint on file..  Symptoms began several days ago.  Interventions attempted: Prescription medications: Meclizine , Ondansetron .  Symptoms are: unchanged.  Triage Disposition: No disposition on file.  Patient/caregiver understands and will follow disposition?:  Reason for Disposition  [1] MODERATE dizziness (e.g., interferes with normal activities) AND [2] has NOT been evaluated by doctor (or NP/PA) for this  (Exception: Dizziness caused by heat exposure, sudden standing, or poor fluid intake.)  Answer Assessment - Initial Assessment Questions 1. DESCRIPTION: Describe your dizziness.     Loud Roar, Spinning sensation of the room  2. LIGHTHEADED: Do you feel lightheaded? (e.g., somewhat faint, woozy, weak upon standing)     Denies  3. VERTIGO: Do you feel like either you or the room is spinning or tilting? (i.e., vertigo)     Spinning sensation  4. SEVERITY: How bad is it?  Do you feel like you are going to faint? Can you stand and walk?       Moderate  5. ONSET:  When did the dizziness begin?     Since Saturday  6. AGGRAVATING FACTORS: Does anything make it worse? (e.g., standing, change in head position)     None noted  7. HEART RATE: Can you tell me your heart rate? How many beats in 15 seconds?  (Note: Not all patients can do this.)       Unsure  8. CAUSE: What do you think is causing the dizziness? (e.g., decreased fluids or food, diarrhea, emotional distress, heat exposure, new medicine, sudden standing, vomiting; unknown)     Unsure, Recent UC Visit  9. RECURRENT SYMPTOM: Have you had dizziness before? If Yes, ask: When was the last time? What happened that time?     No  10. OTHER SYMPTOMS: Do you have any other symptoms? (e.g., fever, chest pain, vomiting, diarrhea,  bleeding)       Headaches, Earaches  11. PREGNANCY: Is there any chance you are pregnant? When was your last menstrual period?       No and No  Protocols used: Dizziness - Lightheadedness-A-AH

## 2023-09-22 NOTE — Telephone Encounter (Signed)
 Patient scheduled.

## 2023-09-22 NOTE — Telephone Encounter (Signed)
 This RN attempted to contact patient for triage. No answer, Mailbox is full.   2nd Attempt.

## 2023-09-23 ENCOUNTER — Ambulatory Visit: Payer: Self-pay | Admitting: Family Medicine

## 2023-09-23 ENCOUNTER — Encounter: Payer: Self-pay | Admitting: Family Medicine

## 2023-09-23 VITALS — BP 96/68 | HR 72 | Ht 60.0 in | Wt 221.0 lb

## 2023-09-23 DIAGNOSIS — R42 Dizziness and giddiness: Secondary | ICD-10-CM

## 2023-09-23 NOTE — Assessment & Plan Note (Addendum)
 Unknown etiology Blood pressure reading on the lower side, advise patient to take blood pressure daily to monitor trends. Current Regimen:Amiloride5 mg Candesartan  32 mg, Propranolol  80 BID  Continue meclizine  12.5 PRN Labs ordered to rule out deficiency, BMP, CBC, Iron panel, Referral placed to Audiology Testing to evaluate inner ear function, as many vertigo cases are related to vestibular issues.

## 2023-09-23 NOTE — Progress Notes (Signed)
 Established Patient Office Visit   Subjective  Patient ID: Norma Sanchez, female    DOB: March 31, 1960  Age: 63 y.o. MRN: [id number]  Chief Complaint  Patient presents with   Dizziness   Headache    She  has a past medical history of Bilateral carpal tunnel syndrome, Biliary dyskinesia (12/07/2011), Fatty liver, GERD (gastroesophageal reflux disease), Hepatitis B, High cholesterol, History of kidney stones, Hypertension, Hypothyroidism, Multinodular goiter, PONV (postoperative nausea and vomiting), Pre-diabetes, and Skin cancer.  The patient has been experiencing constant headaches and intermittent dizziness that started about one week ago. The dizziness comes and goes and the symptoms have been waxing and waning. Associated symptoms include fatigue, nausea, and vertigo. There is no neck pain, visual changes, or vomiting reported. The symptoms worsen with walking. The patient has tried Antivert  (meclizine ) 12.5 mg, which has provided moderate relief.    Review of Systems  Constitutional:  Negative for chills and fever.  Respiratory:  Negative for shortness of breath.   Cardiovascular:  Negative for chest pain.  Gastrointestinal:  Positive for nausea. Negative for vomiting.  Musculoskeletal:  Negative for neck pain.  Neurological:  Positive for dizziness and headaches.      Objective:     BP 96/68   Pulse 72   Ht 5' (1.524 m)   Wt 221 lb (100.2 kg)   SpO2 95%   BMI 43.16 kg/m  BP Readings from Last 3 Encounters:  09/23/23 96/68  09/20/23 113/78  06/07/23 126/82      Physical Exam Vitals reviewed.  Constitutional:      General: She is not in acute distress.    Appearance: Normal appearance. She is not ill-appearing, toxic-appearing or diaphoretic.  HENT:     Head: Normocephalic.  Eyes:     General:        Right eye: No discharge.        Left eye: No discharge.     Conjunctiva/sclera: Conjunctivae normal.     Pupils: Pupils are equal, round, and reactive to light.   Cardiovascular:     Rate and Rhythm: Normal rate.     Pulses: Normal pulses.     Heart sounds: Normal heart sounds.  Pulmonary:     Effort: Pulmonary effort is normal. No respiratory distress.     Breath sounds: Normal breath sounds.  Musculoskeletal:     Cervical back: Normal range of motion.  Skin:    General: Skin is warm and dry.     Capillary Refill: Capillary refill takes less than 2 seconds.  Neurological:     Mental Status: She is alert.  Psychiatric:        Mood and Affect: Mood normal.      No results found for any visits on 09/23/23.  The 10-year ASCVD risk score (Arnett DK, et al., 2019) is: 2.9%    Assessment & Plan:  Dizziness Assessment & Plan: Unknown etiology Blood pressure reading on the lower side, advise patient to take blood pressure daily to monitor trends. Current Regimen:Amiloride5 mg Candesartan  32 mg, Propranolol  80 BID  Continue meclizine  12.5 PRN Labs ordered to rule out deficiency, BMP, CBC, Iron panel, Referral placed to Audiology Testing to evaluate inner ear function, as many vertigo cases are related to vestibular issues.    Orders: -     Ambulatory referral to Audiology -     BMP8+eGFR -     CBC with Differential/Platelet -     Iron, TIBC and Ferritin Panel  Return in 2 weeks (on 10/07/2023), or if symptoms worsen or fail to improve, for Blood pressure follow up with PCP Leita.   Hilario Kidd Wilhelmena Falter, FNP

## 2023-09-23 NOTE — Patient Instructions (Signed)

## 2023-09-24 LAB — BMP8+EGFR
BUN/Creatinine Ratio: 17 (ref 12–28)
BUN: 28 mg/dL — ABNORMAL HIGH (ref 8–27)
CO2: 18 mmol/L — ABNORMAL LOW (ref 20–29)
Calcium: 11.3 mg/dL — ABNORMAL HIGH (ref 8.7–10.3)
Chloride: 97 mmol/L (ref 96–106)
Creatinine, Ser: 1.66 mg/dL — ABNORMAL HIGH (ref 0.57–1.00)
Glucose: 96 mg/dL (ref 70–99)
Potassium: 5.4 mmol/L — ABNORMAL HIGH (ref 3.5–5.2)
Sodium: 133 mmol/L — ABNORMAL LOW (ref 134–144)
eGFR: 34 mL/min/1.73 — ABNORMAL LOW (ref >59)

## 2023-09-24 LAB — CBC WITH DIFFERENTIAL/PLATELET
Basophils Absolute: 0.1 x10E3/uL (ref 0.0–0.2)
Basos: 1 %
EOS (ABSOLUTE): 0.3 x10E3/uL (ref 0.0–0.4)
Eos: 2 %
Hematocrit: 41.7 % (ref 34.0–46.6)
Hemoglobin: 13.6 g/dL (ref 11.1–15.9)
Immature Grans (Abs): 0.2 x10E3/uL — ABNORMAL HIGH (ref 0.0–0.1)
Immature Granulocytes: 1 %
Lymphocytes Absolute: 3.1 x10E3/uL (ref 0.7–3.1)
Lymphs: 22 %
MCH: 28.3 pg (ref 26.6–33.0)
MCHC: 32.6 g/dL (ref 31.5–35.7)
MCV: 87 fL (ref 79–97)
Monocytes Absolute: 1.7 x10E3/uL — ABNORMAL HIGH (ref 0.1–0.9)
Monocytes: 12 %
Neutrophils Absolute: 8.7 x10E3/uL — ABNORMAL HIGH (ref 1.4–7.0)
Neutrophils: 62 %
Platelets: 440 x10E3/uL (ref 150–450)
RBC: 4.8 x10E6/uL (ref 3.77–5.28)
RDW: 12.7 % (ref 11.7–15.4)
WBC: 14.1 x10E3/uL — ABNORMAL HIGH (ref 3.4–10.8)

## 2023-09-24 LAB — IRON,TIBC AND FERRITIN PANEL
Ferritin: 436 ng/mL — ABNORMAL HIGH (ref 15–150)
Iron Saturation: 11 % — ABNORMAL LOW (ref 15–55)
Iron: 30 ug/dL (ref 27–139)
Total Iron Binding Capacity: 265 ug/dL (ref 250–450)
UIBC: 235 ug/dL (ref 118–369)

## 2023-09-28 ENCOUNTER — Ambulatory Visit: Payer: Self-pay | Admitting: Family Medicine

## 2023-09-28 ENCOUNTER — Other Ambulatory Visit: Payer: Self-pay

## 2023-09-28 ENCOUNTER — Emergency Department (HOSPITAL_COMMUNITY)
Admission: EM | Admit: 2023-09-28 | Discharge: 2023-09-29 | Disposition: A | Discharge status: Home / Self Care | Source: Home / Self Care | Attending: Emergency Medicine | Admitting: Emergency Medicine

## 2023-09-28 ENCOUNTER — Encounter (HOSPITAL_COMMUNITY): Payer: Self-pay | Admitting: *Deleted

## 2023-09-28 ENCOUNTER — Emergency Department (HOSPITAL_COMMUNITY)

## 2023-09-28 ENCOUNTER — Other Ambulatory Visit: Payer: Self-pay | Admitting: Family Medicine

## 2023-09-28 DIAGNOSIS — D75839 Thrombocytosis, unspecified: Secondary | ICD-10-CM | POA: Insufficient documentation

## 2023-09-28 DIAGNOSIS — E875 Hyperkalemia: Secondary | ICD-10-CM | POA: Insufficient documentation

## 2023-09-28 DIAGNOSIS — N200 Calculus of kidney: Secondary | ICD-10-CM

## 2023-09-28 DIAGNOSIS — D72829 Elevated white blood cell count, unspecified: Secondary | ICD-10-CM | POA: Insufficient documentation

## 2023-09-28 DIAGNOSIS — I10 Essential (primary) hypertension: Secondary | ICD-10-CM | POA: Insufficient documentation

## 2023-09-28 DIAGNOSIS — R109 Unspecified abdominal pain: Secondary | ICD-10-CM | POA: Diagnosis not present

## 2023-09-28 DIAGNOSIS — N132 Hydronephrosis with renal and ureteral calculous obstruction: Secondary | ICD-10-CM | POA: Diagnosis not present

## 2023-09-28 DIAGNOSIS — K5730 Diverticulosis of large intestine without perforation or abscess without bleeding: Secondary | ICD-10-CM | POA: Diagnosis not present

## 2023-09-28 DIAGNOSIS — Z7982 Long term (current) use of aspirin: Secondary | ICD-10-CM | POA: Insufficient documentation

## 2023-09-28 DIAGNOSIS — N136 Pyonephrosis: Secondary | ICD-10-CM | POA: Diagnosis not present

## 2023-09-28 DIAGNOSIS — Z79899 Other long term (current) drug therapy: Secondary | ICD-10-CM | POA: Insufficient documentation

## 2023-09-28 DIAGNOSIS — N1832 Chronic kidney disease, stage 3b: Secondary | ICD-10-CM

## 2023-09-28 LAB — URINALYSIS, ROUTINE W REFLEX MICROSCOPIC
Bilirubin Urine: NEGATIVE
Glucose, UA: NEGATIVE mg/dL
Ketones, ur: NEGATIVE mg/dL
Nitrite: NEGATIVE
Protein, ur: 30 mg/dL — AB
RBC / HPF: >50 RBC/hpf (ref 0–5)
Specific Gravity, Urine: 1.021 (ref 1.005–1.030)
WBC, UA: >50 WBC/hpf (ref 0–5)
pH: 5 (ref 5.0–8.0)

## 2023-09-28 LAB — CBC WITH DIFFERENTIAL/PLATELET
Abs Immature Granulocytes: 0.54 K/uL — ABNORMAL HIGH (ref 0.00–0.07)
Basophils Absolute: 0.1 K/uL (ref 0.0–0.1)
Basophils Relative: 1 %
Eosinophils Absolute: 0.1 K/uL (ref 0.0–0.5)
Eosinophils Relative: 0 %
HCT: 41.4 % (ref 36.0–46.0)
Hemoglobin: 13.4 g/dL (ref 12.0–15.0)
Immature Granulocytes: 3 %
Lymphocytes Relative: 7 %
Lymphs Abs: 1.4 K/uL (ref 0.7–4.0)
MCH: 28.3 pg (ref 26.0–34.0)
MCHC: 32.4 g/dL (ref 30.0–36.0)
MCV: 87.5 fL (ref 80.0–100.0)
Monocytes Absolute: 1 K/uL (ref 0.1–1.0)
Monocytes Relative: 5 %
Neutro Abs: 16.9 K/uL — ABNORMAL HIGH (ref 1.7–7.7)
Neutrophils Relative %: 84 %
Platelets: 483 K/uL — ABNORMAL HIGH (ref 150–400)
RBC: 4.73 MIL/uL (ref 3.87–5.11)
RDW: 13.3 % (ref 11.5–15.5)
WBC: 20 K/uL — ABNORMAL HIGH (ref 4.0–10.5)
nRBC: 0 % (ref 0.0–0.2)

## 2023-09-28 LAB — MAGNESIUM: Magnesium: 1.6 mg/dL — ABNORMAL LOW (ref 1.7–2.4)

## 2023-09-28 LAB — COMPREHENSIVE METABOLIC PANEL WITH GFR
ALT: 26 U/L (ref 0–44)
AST: 17 U/L (ref 15–41)
Albumin: 3.4 g/dL — ABNORMAL LOW (ref 3.5–5.0)
Alkaline Phosphatase: 119 U/L (ref 38–126)
Anion gap: 10 (ref 5–15)
BUN: 25 mg/dL — ABNORMAL HIGH (ref 8–23)
CO2: 20 mmol/L — ABNORMAL LOW (ref 22–32)
Calcium: 10.7 mg/dL — ABNORMAL HIGH (ref 8.9–10.3)
Chloride: 104 mmol/L (ref 98–111)
Creatinine, Ser: 1.31 mg/dL — ABNORMAL HIGH (ref 0.44–1.00)
GFR, Estimated: 46 mL/min — ABNORMAL LOW
Glucose, Bld: 147 mg/dL — ABNORMAL HIGH (ref 70–99)
Potassium: 5.4 mmol/L — ABNORMAL HIGH (ref 3.5–5.1)
Sodium: 134 mmol/L — ABNORMAL LOW (ref 135–145)
Total Bilirubin: 0.7 mg/dL (ref 0.0–1.2)
Total Protein: 7.6 g/dL (ref 6.5–8.1)

## 2023-09-28 LAB — LIPASE, BLOOD: Lipase: 30 U/L (ref 11–51)

## 2023-09-28 MED ORDER — SODIUM ZIRCONIUM CYCLOSILICATE 5 G PO PACK
10.0000 g | PACK | Freq: Once | ORAL | Status: AC
  Administered 2023-09-29: 10 g via ORAL
  Filled 2023-09-28: qty 2

## 2023-09-28 MED ORDER — ONDANSETRON HCL 4 MG/2ML IJ SOLN
4.0000 mg | Freq: Once | INTRAMUSCULAR | Status: AC
  Administered 2023-09-28: 4 mg via INTRAVENOUS
  Filled 2023-09-28: qty 2

## 2023-09-28 MED ORDER — HYDROMORPHONE HCL 1 MG/ML IJ SOLN
0.5000 mg | Freq: Once | INTRAMUSCULAR | Status: AC
  Administered 2023-09-28: 0.5 mg via INTRAVENOUS
  Filled 2023-09-28: qty 0.5

## 2023-09-28 MED ORDER — METOCLOPRAMIDE HCL 5 MG/ML IJ SOLN
10.0000 mg | Freq: Once | INTRAMUSCULAR | Status: AC
  Administered 2023-09-29: 10 mg via INTRAVENOUS
  Filled 2023-09-28: qty 2

## 2023-09-28 MED ORDER — KETOROLAC TROMETHAMINE 15 MG/ML IJ SOLN
15.0000 mg | Freq: Once | INTRAMUSCULAR | Status: AC
  Administered 2023-09-28: 15 mg via INTRAVENOUS
  Filled 2023-09-28: qty 1

## 2023-09-28 MED ORDER — OXYCODONE-ACETAMINOPHEN 5-325 MG PO TABS: 1.0000 | ORAL_TABLET | Freq: Four times a day (QID) | ORAL | 0 refills | Status: DC | PRN

## 2023-09-28 MED ORDER — SODIUM CHLORIDE 0.9 % IV SOLN
1.0000 g | Freq: Once | INTRAVENOUS | Status: AC
  Administered 2023-09-28: 1 g via INTRAVENOUS
  Filled 2023-09-28: qty 10

## 2023-09-28 MED ORDER — MAGNESIUM SULFATE 2 GM/50ML IV SOLN
2.0000 g | Freq: Once | INTRAVENOUS | Status: AC
  Administered 2023-09-28: 2 g via INTRAVENOUS
  Filled 2023-09-28: qty 50

## 2023-09-28 MED ORDER — LACTATED RINGERS IV BOLUS
1000.0000 mL | Freq: Once | INTRAVENOUS | Status: AC
  Administered 2023-09-28: 1000 mL via INTRAVENOUS

## 2023-09-28 NOTE — ED Provider Notes (Signed)
 [place] EMERGENCY DEPARTMENT AT [facility] Provider Note   CSN: [id number] Arrival date & time: 09/28/23  1537     Patient presents with: Flank Pain   Norma Sanchez is a 63 y.o. female.    Flank Pain Associated symptoms include abdominal pain.  Patient presents for flank pain and nausea.  Medical history includes HTN, HLD, GERD, prediabetes, nephrolithiasis.  A week ago, she had some right sided flank pain.  This resolved but returned today.  She describes pain throughout right flank as well as right lower abdomen.  She has had nausea without vomiting.  Current pain severity is 10/10.     Prior to Admission medications   Medication Sig Start Date End Date Taking? Authorizing Provider  oxyCODONE -acetaminophen  (PERCOCET/ROXICET) 5-325 MG tablet Take 1 tablet by mouth every 6 (six) hours as needed for severe pain (pain score 7-10). 09/28/23  Yes Melvenia Motto, MD  acetaminophen  (TYLENOL ) 325 MG tablet Take 2 tablets (650 mg total) by mouth every 6 (six) hours as needed. 04/03/23   Elnor Jayson LABOR, DO  aMILoride  (MIDAMOR ) 5 MG tablet Take 1 tablet (5 mg total) by mouth daily. 03/30/23   Cranford, Tonya, NP  Ascorbic Acid (VITAMIN C PO) Take 1 tablet by mouth in the morning.    [provider]  aspirin  EC 81 MG tablet Take 81 mg by mouth in the morning. Swallow whole.    [provider]  atorvastatin  (LIPITOR) 10 MG tablet Take 1 tablet (10 mg total) by mouth daily for cholesterol 08/01/23   Melvenia Manus BRAVO, MD  buPROPion  (WELLBUTRIN  XL) 150 MG 24 hr tablet Take 1 tablet (150 mg total) by mouth every morning. 09/05/23   Bevely Doffing, FNP  candesartan  (ATACAND ) 32 MG tablet Take 1 tablet (32 mg total) by mouth nightly for blood pressure 08/01/23   Melvenia Manus BRAVO, MD  cetirizine (ZYRTEC) 10 MG tablet Take 10 mg by mouth in the morning.    [provider]  escitalopram  (LEXAPRO ) 20 MG tablet Take 1 tablet (20 mg total) by mouth every evening. 09/05/23   Bevely Doffing, FNP  esomeprazole  (NEXIUM ) 40 MG capsule Take 1 capsule (40 mg total) by mouth daily to prevent heartburn & indigestion 09/05/23   Bevely Doffing, FNP  fluticasone  (FLONASE ) 50 MCG/ACT nasal spray Place 1 spray into both nostrils daily. Patient taking differently: Place 1 spray into both nostrils at bedtime. 11/02/18   Craig Alan SAUNDERS, PA-C  meclizine  (ANTIVERT ) 12.5 MG tablet Take 1 tablet (12.5 mg total) by mouth 3 (three) times daily as needed for dizziness. 09/20/23   Leath-Warren, Etta PARAS, NP  metFORMIN  (GLUCOPHAGE -XR) 500 MG 24 hr tablet Take 2 tablets (1,000 mg total) by mouth 2 (two) times daily. 08/01/23   Melvenia Manus BRAVO, MD  ondansetron  (ZOFRAN -ODT) 4 MG disintegrating tablet Take 1 tablet (4 mg total) by mouth every 8 (eight) hours as needed. 09/20/23   Leath-Warren, Etta PARAS, NP  phentermine  (ADIPEX-P ) 37.5 MG tablet Take 1 tablet (37.5 mg total) by mouth daily before breakfast. 09/02/23   Bevely Doffing, FNP  propranolol  (INDERAL ) 80 MG tablet Take 1 tablet (80 mg total) by mouth in the morning and in the evening for blood pressure. 09/05/23   Bevely Doffing, FNP  SYNTHROID  137 MCG tablet Take 1 tablet (137 mcg total) by mouth daily before breakfast. 07/07/23   Tobie Suzzane POUR, MD    Allergies: Semaglutide , Avelox [moxifloxacin hcl in nacl], Hydrochlorothiazide, Other, Penicillins, Prednisone, Zostavax [zoster vaccine  live], Codeine, Levothyroxine , and Sulfa antibiotics    Review of Systems  Gastrointestinal:  Positive for abdominal pain and nausea.  Genitourinary:  Positive for flank pain.  All other systems reviewed and are negative.   Updated Vital Signs BP 125/72   Pulse 65   Temp 98.1 F (36.7 C) (Oral)   Resp 18   Ht 5' (1.524 m)   Wt 98.9 kg   SpO2 98%   BMI 42.58 kg/m   Physical Exam Vitals and nursing note reviewed.  Constitutional:      General: She is not in acute distress.    Appearance: Normal appearance. She is well-developed. She is not  ill-appearing, toxic-appearing or diaphoretic.  HENT:     Head: Normocephalic and atraumatic.     Right Ear: External ear normal.     Left Ear: External ear normal.     Nose: Nose normal.     Mouth/Throat:     Mouth: Mucous membranes are moist.  Eyes:     Extraocular Movements: Extraocular movements intact.     Conjunctiva/sclera: Conjunctivae normal.  Cardiovascular:     Rate and Rhythm: Normal rate and regular rhythm.  Pulmonary:     Effort: Pulmonary effort is normal. No respiratory distress.  Abdominal:     General: There is no distension.     Palpations: Abdomen is soft.     Tenderness: There is no abdominal tenderness.  Musculoskeletal:        General: No swelling. Normal range of motion.     Cervical back: Normal range of motion and neck supple.  Skin:    General: Skin is warm and dry.  Neurological:     General: No focal deficit present.     Mental Status: She is alert and oriented to person, place, and time.  Psychiatric:        Mood and Affect: Mood normal.        Behavior: Behavior normal.     (all labs ordered are listed, but only abnormal results are displayed) Labs Reviewed  URINALYSIS, ROUTINE W REFLEX MICROSCOPIC - Abnormal; Notable for the following components:      Result Value   APPearance CLOUDY (*)    Hgb urine dipstick MODERATE (*)    Protein, ur 30 (*)    Leukocytes,Ua LARGE (*)    Bacteria, UA RARE (*)    Non Squamous Epithelial 6-10 (*)    All other components within normal limits  COMPREHENSIVE METABOLIC PANEL WITH GFR - Abnormal; Notable for the following components:   Sodium 134 (*)    Potassium 5.4 (*)    CO2 20 (*)    Glucose, Bld 147 (*)    BUN 25 (*)    Creatinine, Ser 1.31 (*)    Calcium  10.7 (*)    Albumin 3.4 (*)    GFR, Estimated 46 (*)    All other components within normal limits  CBC WITH DIFFERENTIAL/PLATELET - Abnormal; Notable for the following components:   WBC 20.0 (*)    Platelets 483 (*)    Neutro Abs 16.9 (*)     Abs Immature Granulocytes 0.54 (*)    All other components within normal limits  MAGNESIUM  - Abnormal; Notable for the following components:   Magnesium  1.6 (*)    All other components within normal limits  LIPASE, BLOOD    EKG: None  Radiology: CT RENAL STONE STUDY Result Date: 09/28/2023 CLINICAL DATA:  Nausea, known right-sided kidney stone, abdominal pain EXAM: CT ABDOMEN AND  PELVIS WITHOUT CONTRAST TECHNIQUE: Multidetector CT imaging of the abdomen and pelvis was performed following the standard protocol without IV contrast. RADIATION DOSE REDUCTION: This exam was performed according to the departmental dose-optimization program which includes automated exposure control, adjustment of the mA and/or kV according to patient size and/or use of iterative reconstruction technique. COMPARISON:  04/03/2023 FINDINGS: Lower chest: No acute pleural or parenchymal lung disease. Hepatobiliary: Cholecystectomy. Unremarkable unenhanced appearance of the liver. No biliary duct dilation. Pancreas: Unremarkable unenhanced appearance. Spleen: Unremarkable unenhanced appearance. Adrenals/Urinary Tract: There is an obstructing 17 mm right UPJ calculus, reference image 42/2. Moderate right-sided hydronephrosis, with significant right renal edema and perinephric fat stranding also noted. The left kidney is unremarkable. The adrenals and bladder are normal. Stomach/Bowel: No bowel obstruction or ileus. Normal appendix right mid abdomen. Scattered distal colonic diverticulosis without diverticulitis. No bowel wall thickening or inflammatory change. Vascular/Lymphatic: Aortic atherosclerosis. No enlarged abdominal or pelvic lymph nodes. Reproductive: Status post hysterectomy. No adnexal masses. Other: No free fluid or free intraperitoneal gas. No abdominal wall hernia. Musculoskeletal: No acute or destructive bony abnormalities. Reconstructed images demonstrate no additional findings. IMPRESSION: 1. Obstructing 17 mm right  UPJ calculus, with moderate right hydronephrosis and significant right perinephric fat stranding. 2.  Aortic Atherosclerosis (ICD10-I70.0). Electronically Signed   By: Ozell Daring M.D.   On: 09/28/2023 21:11     Procedures   Medications Ordered in the ED  magnesium  sulfate IVPB 2 g 50 mL (2 g Intravenous New Bag/Given 09/28/23 2248)  metoCLOPramide  (REGLAN ) injection 10 mg (has no administration in time range)  sodium zirconium cyclosilicate  (LOKELMA ) packet 10 g (has no administration in time range)  HYDROmorphone  (DILAUDID ) injection 0.5 mg (0.5 mg Intravenous Given 09/28/23 2021)  ondansetron  (ZOFRAN ) injection 4 mg (4 mg Intravenous Given 09/28/23 2017)  lactated ringers  bolus 1,000 mL (0 mLs Intravenous Paused 09/28/23 2221)  cefTRIAXone  (ROCEPHIN ) 1 g in sodium chloride  0.9 % 100 mL IVPB (1 g Intravenous New Bag/Given 09/28/23 2200)  ketorolac  (TORADOL ) 15 MG/ML injection 15 mg (15 mg Intravenous Given 09/28/23 2223)  HYDROmorphone  (DILAUDID ) injection 0.5 mg (0.5 mg Intravenous Given 09/28/23 2226)                                    Medical Decision Making Amount and/or Complexity of Data Reviewed Labs: ordered. Radiology: ordered.  Risk Prescription drug management.   This patient presents to the ED for concern of right flank pain and nausea, this involves an extensive number of treatment options, and is a complaint that carries with it a high risk of complications and morbidity.  The differential diagnosis includes nephrolithiasis, pyelonephritis, colitis, constipation, musculoskeletal etiology   Co morbidities / Chronic conditions that complicate the patient evaluation  HTN, HLD, GERD, prediabetes, nephrolithiasis   Additional history obtained:  Additional history obtained from EMR External records from outside source obtained and reviewed including N/A   Lab Tests:  I Ordered, and personally interpreted labs.  The pertinent results include: Baseline creatinine, mild  hyperkalemia and hypercalcemia.  Hypomagnesemia also present.  A leukocytosis and thrombocytosis are present.  Urinalysis shows hematuria but is equivocal for UTI given contamination with squamous epithelial cells.   Imaging Studies ordered:  I ordered imaging studies including CT stone study I independently visualized and interpreted imaging which showed 17 mm right UPJ obstructing calculus with right perinephric fat stranding and hydronephrosis. I agree with the radiologist interpretation  Cardiac Monitoring: / EKG:  The patient was maintained on a cardiac monitor.  I personally viewed and interpreted the cardiac monitored which showed an underlying rhythm of: Sinus rhythm   Problem List / ED Course / Critical interventions / Medication management  Patient is in for right flank pain and nausea starting today.  On arrival in the ED, vital signs are normal.  She is well-appearing on exam but endorses 10/10 pain.  No significant tenderness is present.  She does have history of nephrolithiasis.  IV fluids and Dilaudid  were ordered.  Zofran  was ordered for nausea.  Workup was initiated.  Patient's lab work notable for leukocytosis with some mild electrolyte abnormalities.  Patient was given magnesium  sulfate for hypomagnesemia.  Lokelma  given for hyperkalemia.  Her urinalysis did show hematuria but was equivocal for UTI given presence of a squamous epithelial cells.  She was given a dose of ceftriaxone .  She denies any recent urinary discomfort or frequency.  She underwent CT stone study which did show a large right-sided UPJ obstructing stone with associated hydronephrosis and perinephric stranding.  I spoke with urologist on-call, Dr. Gaston, who advises treating her pain and discharging if possible.  The patient symptoms are able to be controlled, patient to call reasonable urology office in the morning.  If symptoms are unable to be relieved, patient to be admitted to [facility].  I  discussed these options with the patient.  Patient is very much in favor of going home tonight.  She was given additional Dilaudid  and Toradol  for analgesia.  On further reassessment, patient's pain currently 2/10 in severity.  Nausea has resolved.  She requests discharge home at this time.  Patient was given Percocet pill pack to go.  She states that she does have Zofran  at home.  She was encouraged to return to the ED for any worsening of symptoms.  Patient was discharged in stable condition. I ordered medication including IV fluids for hydration, Zofran  and Reglan  for nausea, Dilaudid  and Toradol  for analgesia, ceftriaxone  for possible UTI, Lokelma  for hyperkalemia, magnesium  sulfate for hypomagnesemia Reevaluation of the patient after these medicines showed that the patient improved I have reviewed the patients home medicines and have made adjustments as needed   Consultations Obtained:  I requested consultation with the neurologist, Dr. Gaston,  and discussed lab and imaging findings as well as pertinent plan - they recommend: Discharge and outpatient follow-up tomorrow if possible.   Social Determinants of Health:  Lives independently     Final diagnoses:  Kidney stone    ED Discharge Orders          Ordered    oxyCODONE -acetaminophen  (PERCOCET/ROXICET) 5-325 MG tablet  Every 6 hours PRN        09/28/23 2311               Melvenia Motto, MD 09/28/23 2323

## 2023-09-28 NOTE — Discharge Instructions (Addendum)
 You have a large right sided kidney stone.  You will need to call the telephone number below first thing in the morning to get seen in the urology office.  Return to the emergency department if you have any worsening of symptoms.

## 2023-09-28 NOTE — ED Notes (Signed)
 Patient transported to CT

## 2023-09-28 NOTE — ED Notes (Signed)
 Pt not located in [place]

## 2023-09-28 NOTE — ED Triage Notes (Addendum)
 Pt with known kidney stone on right, pain started last Sunday. Lot of nausea per pt

## 2023-09-29 ENCOUNTER — Other Ambulatory Visit: Payer: Self-pay

## 2023-09-29 ENCOUNTER — Ambulatory Visit: Admitting: Urology

## 2023-09-29 ENCOUNTER — Inpatient Hospital Stay (HOSPITAL_COMMUNITY)
Admission: EM | Admit: 2023-09-29 | Discharge: 2023-10-02 | DRG: 660 | Disposition: A | Discharge status: Home / Self Care | Attending: Internal Medicine | Admitting: Internal Medicine

## 2023-09-29 ENCOUNTER — Encounter: Payer: Self-pay | Admitting: Urology

## 2023-09-29 ENCOUNTER — Encounter (HOSPITAL_COMMUNITY): Payer: Self-pay

## 2023-09-29 ENCOUNTER — Other Ambulatory Visit (HOSPITAL_COMMUNITY): Payer: Self-pay

## 2023-09-29 VITALS — BP 78/49 | HR 93

## 2023-09-29 DIAGNOSIS — Z887 Allergy status to serum and vaccine status: Secondary | ICD-10-CM | POA: Diagnosis not present

## 2023-09-29 DIAGNOSIS — Z79890 Hormone replacement therapy (postmenopausal): Secondary | ICD-10-CM

## 2023-09-29 DIAGNOSIS — Z8249 Family history of ischemic heart disease and other diseases of the circulatory system: Secondary | ICD-10-CM | POA: Diagnosis not present

## 2023-09-29 DIAGNOSIS — N136 Pyonephrosis: Secondary | ICD-10-CM | POA: Diagnosis not present

## 2023-09-29 DIAGNOSIS — N202 Calculus of kidney with calculus of ureter: Secondary | ICD-10-CM | POA: Diagnosis not present

## 2023-09-29 DIAGNOSIS — Z85828 Personal history of other malignant neoplasm of skin: Secondary | ICD-10-CM

## 2023-09-29 DIAGNOSIS — B379 Candidiasis, unspecified: Secondary | ICD-10-CM

## 2023-09-29 DIAGNOSIS — Z880 Allergy status to penicillin: Secondary | ICD-10-CM | POA: Diagnosis not present

## 2023-09-29 DIAGNOSIS — Z79899 Other long term (current) drug therapy: Secondary | ICD-10-CM

## 2023-09-29 DIAGNOSIS — N201 Calculus of ureter: Secondary | ICD-10-CM

## 2023-09-29 DIAGNOSIS — N1330 Unspecified hydronephrosis: Secondary | ICD-10-CM

## 2023-09-29 DIAGNOSIS — F32A Depression, unspecified: Secondary | ICD-10-CM | POA: Diagnosis not present

## 2023-09-29 DIAGNOSIS — T3695XA Adverse effect of unspecified systemic antibiotic, initial encounter: Secondary | ICD-10-CM | POA: Diagnosis not present

## 2023-09-29 DIAGNOSIS — R42 Dizziness and giddiness: Secondary | ICD-10-CM | POA: Diagnosis not present

## 2023-09-29 DIAGNOSIS — R109 Unspecified abdominal pain: Secondary | ICD-10-CM

## 2023-09-29 DIAGNOSIS — I10 Essential (primary) hypertension: Secondary | ICD-10-CM | POA: Diagnosis present

## 2023-09-29 DIAGNOSIS — E119 Type 2 diabetes mellitus without complications: Secondary | ICD-10-CM | POA: Diagnosis not present

## 2023-09-29 DIAGNOSIS — E872 Acidosis, unspecified: Secondary | ICD-10-CM | POA: Diagnosis present

## 2023-09-29 DIAGNOSIS — Z820 Family history of epilepsy and other diseases of the nervous system: Secondary | ICD-10-CM

## 2023-09-29 DIAGNOSIS — K760 Fatty (change of) liver, not elsewhere classified: Secondary | ICD-10-CM | POA: Diagnosis not present

## 2023-09-29 DIAGNOSIS — Z90710 Acquired absence of both cervix and uterus: Secondary | ICD-10-CM

## 2023-09-29 DIAGNOSIS — R609 Edema, unspecified: Secondary | ICD-10-CM | POA: Diagnosis not present

## 2023-09-29 DIAGNOSIS — A419 Sepsis, unspecified organism: Secondary | ICD-10-CM | POA: Diagnosis not present

## 2023-09-29 DIAGNOSIS — Z6841 Body Mass Index (BMI) 40.0 and over, adult: Secondary | ICD-10-CM | POA: Diagnosis not present

## 2023-09-29 DIAGNOSIS — Z882 Allergy status to sulfonamides status: Secondary | ICD-10-CM | POA: Diagnosis not present

## 2023-09-29 DIAGNOSIS — B964 Proteus (mirabilis) (morganii) as the cause of diseases classified elsewhere: Secondary | ICD-10-CM | POA: Diagnosis not present

## 2023-09-29 DIAGNOSIS — Z7982 Long term (current) use of aspirin: Secondary | ICD-10-CM

## 2023-09-29 DIAGNOSIS — E7800 Pure hypercholesterolemia, unspecified: Secondary | ICD-10-CM | POA: Diagnosis present

## 2023-09-29 DIAGNOSIS — N390 Urinary tract infection, site not specified: Principal | ICD-10-CM | POA: Diagnosis present

## 2023-09-29 DIAGNOSIS — E785 Hyperlipidemia, unspecified: Secondary | ICD-10-CM | POA: Diagnosis present

## 2023-09-29 DIAGNOSIS — Z881 Allergy status to other antibiotic agents status: Secondary | ICD-10-CM

## 2023-09-29 DIAGNOSIS — Z823 Family history of stroke: Secondary | ICD-10-CM

## 2023-09-29 DIAGNOSIS — N179 Acute kidney failure, unspecified: Secondary | ICD-10-CM | POA: Diagnosis not present

## 2023-09-29 DIAGNOSIS — N135 Crossing vessel and stricture of ureter without hydronephrosis: Secondary | ICD-10-CM | POA: Diagnosis not present

## 2023-09-29 DIAGNOSIS — Z7984 Long term (current) use of oral hypoglycemic drugs: Secondary | ICD-10-CM

## 2023-09-29 DIAGNOSIS — N12 Tubulo-interstitial nephritis, not specified as acute or chronic: Secondary | ICD-10-CM

## 2023-09-29 DIAGNOSIS — K219 Gastro-esophageal reflux disease without esophagitis: Secondary | ICD-10-CM | POA: Diagnosis present

## 2023-09-29 DIAGNOSIS — Z885 Allergy status to narcotic agent status: Secondary | ICD-10-CM

## 2023-09-29 DIAGNOSIS — E039 Hypothyroidism, unspecified: Secondary | ICD-10-CM | POA: Diagnosis not present

## 2023-09-29 DIAGNOSIS — E875 Hyperkalemia: Secondary | ICD-10-CM | POA: Diagnosis present

## 2023-09-29 DIAGNOSIS — N200 Calculus of kidney: Principal | ICD-10-CM

## 2023-09-29 DIAGNOSIS — D72829 Elevated white blood cell count, unspecified: Secondary | ICD-10-CM

## 2023-09-29 DIAGNOSIS — I4510 Unspecified right bundle-branch block: Secondary | ICD-10-CM | POA: Diagnosis not present

## 2023-09-29 DIAGNOSIS — R8290 Unspecified abnormal findings in urine: Secondary | ICD-10-CM | POA: Diagnosis not present

## 2023-09-29 DIAGNOSIS — R220 Localized swelling, mass and lump, head: Secondary | ICD-10-CM | POA: Diagnosis not present

## 2023-09-29 DIAGNOSIS — Z888 Allergy status to other drugs, medicaments and biological substances status: Secondary | ICD-10-CM

## 2023-09-29 DIAGNOSIS — Z811 Family history of alcohol abuse and dependence: Secondary | ICD-10-CM

## 2023-09-29 DIAGNOSIS — R110 Nausea: Secondary | ICD-10-CM

## 2023-09-29 DIAGNOSIS — Z803 Family history of malignant neoplasm of breast: Secondary | ICD-10-CM

## 2023-09-29 LAB — BASIC METABOLIC PANEL WITH GFR
Anion gap: 11 (ref 5–15)
BUN: 27 mg/dL — ABNORMAL HIGH (ref 8–23)
CO2: 20 mmol/L — ABNORMAL LOW (ref 22–32)
Calcium: 10 mg/dL (ref 8.9–10.3)
Chloride: 102 mmol/L (ref 98–111)
Creatinine, Ser: 2.04 mg/dL — ABNORMAL HIGH (ref 0.44–1.00)
GFR, Estimated: 27 mL/min — ABNORMAL LOW (ref >60)
Glucose, Bld: 92 mg/dL (ref 70–99)
Potassium: 5.3 mmol/L — ABNORMAL HIGH (ref 3.5–5.1)
Sodium: 133 mmol/L — ABNORMAL LOW (ref 135–145)

## 2023-09-29 LAB — CBC WITH DIFFERENTIAL/PLATELET
Abs Immature Granulocytes: 0.18 K/uL — ABNORMAL HIGH (ref 0.00–0.07)
Basophils Absolute: 0.1 K/uL (ref 0.0–0.1)
Basophils Relative: 1 %
Eosinophils Absolute: 0.1 K/uL (ref 0.0–0.5)
Eosinophils Relative: 1 %
HCT: 38.2 % (ref 36.0–46.0)
Hemoglobin: 12 g/dL (ref 12.0–15.0)
Immature Granulocytes: 1 %
Lymphocytes Relative: 6 %
Lymphs Abs: 1 K/uL (ref 0.7–4.0)
MCH: 28.2 pg (ref 26.0–34.0)
MCHC: 31.4 g/dL (ref 30.0–36.0)
MCV: 89.9 fL (ref 80.0–100.0)
Monocytes Absolute: 0.3 K/uL (ref 0.1–1.0)
Monocytes Relative: 2 %
Neutro Abs: 15.3 K/uL — ABNORMAL HIGH (ref 1.7–7.7)
Neutrophils Relative %: 89 %
Platelets: 395 K/uL (ref 150–400)
RBC: 4.25 MIL/uL (ref 3.87–5.11)
RDW: 13.7 % (ref 11.5–15.5)
WBC: 16.9 K/uL — ABNORMAL HIGH (ref 4.0–10.5)
nRBC: 0 % (ref 0.0–0.2)

## 2023-09-29 LAB — MICROSCOPIC EXAMINATION: WBC, UA: >30 /HPF — AB (ref 0–5)

## 2023-09-29 LAB — URINALYSIS, ROUTINE W REFLEX MICROSCOPIC
Bilirubin, UA: NEGATIVE
Glucose, UA: NEGATIVE
Nitrite, UA: NEGATIVE
Specific Gravity, UA: 1.03 (ref 1.005–1.030)
Urobilinogen, Ur: 1 mg/dL (ref 0.2–1.0)
pH, UA: 6 (ref 5.0–7.5)

## 2023-09-29 LAB — GLUCOSE, CAPILLARY: Glucose-Capillary: 82 mg/dL (ref 70–99)

## 2023-09-29 LAB — LACTIC ACID, PLASMA: Lactic Acid, Venous: 1.5 mmol/L (ref 0.5–1.9)

## 2023-09-29 MED ORDER — SODIUM CHLORIDE 0.9 % IV SOLN
2.0000 g | Freq: Once | INTRAVENOUS | Status: AC
  Administered 2023-09-29: 2 g via INTRAVENOUS
  Filled 2023-09-29: qty 20

## 2023-09-29 MED ORDER — ONDANSETRON 8 MG PO TBDP
8.0000 mg | ORAL_TABLET | Freq: Three times a day (TID) | ORAL | 1 refills | Status: AC | PRN
Start: 2023-09-29 — End: ?

## 2023-09-29 MED ORDER — SODIUM CHLORIDE 0.9 % IV SOLN: INTRAVENOUS | Status: AC

## 2023-09-29 MED ORDER — SODIUM ZIRCONIUM CYCLOSILICATE 10 G PO PACK
10.0000 g | PACK | Freq: Once | ORAL | Status: AC
  Administered 2023-09-29: 10 g via ORAL
  Filled 2023-09-29: qty 1

## 2023-09-29 MED ORDER — ONDANSETRON 4 MG PO TBDP
4.0000 mg | ORAL_TABLET | Freq: Three times a day (TID) | ORAL | 0 refills | Status: DC | PRN
  Filled 2023-09-29: qty 20, 7d supply, fill #0

## 2023-09-29 MED ORDER — SODIUM CHLORIDE 0.9 % IV SOLN
2.0000 g | INTRAVENOUS | Status: DC
  Administered 2023-09-30 – 2023-10-01 (×2): 2 g via INTRAVENOUS
  Filled 2023-09-29 (×2): qty 20

## 2023-09-29 MED ORDER — INSULIN ASPART 100 UNIT/ML IJ SOLN
0.0000 [IU] | INTRAMUSCULAR | Status: DC
  Administered 2023-09-30: 5 [IU] via SUBCUTANEOUS
  Administered 2023-09-30: 1 [IU] via SUBCUTANEOUS
  Administered 2023-09-30: 3 [IU] via SUBCUTANEOUS
  Administered 2023-09-30 – 2023-10-01 (×2): 1 [IU] via SUBCUTANEOUS

## 2023-09-29 MED ORDER — ESCITALOPRAM OXALATE 20 MG PO TABS
20.0000 mg | ORAL_TABLET | Freq: Every evening | ORAL | Status: DC
  Administered 2023-09-29 – 2023-10-01 (×3): 20 mg via ORAL
  Filled 2023-09-29 (×3): qty 1

## 2023-09-29 MED ORDER — LACTATED RINGERS IV SOLN: INTRAVENOUS | Status: DC

## 2023-09-29 MED ORDER — OXYCODONE-ACETAMINOPHEN 5-325 MG PO TABS: 1.0000 | ORAL_TABLET | Freq: Four times a day (QID) | ORAL | 0 refills | Status: DC | PRN

## 2023-09-29 MED ORDER — FLUCONAZOLE 150 MG PO TABS
ORAL_TABLET | ORAL | 0 refills | Status: DC
  Filled 2023-09-29: qty 2, 3d supply, fill #0

## 2023-09-29 MED ORDER — ACETAMINOPHEN 650 MG RE SUPP: 650.0000 mg | Freq: Four times a day (QID) | RECTAL | Status: DC | PRN

## 2023-09-29 MED ORDER — HEPARIN SODIUM (PORCINE) 5000 UNIT/ML IJ SOLN
5000.0000 [IU] | Freq: Three times a day (TID) | INTRAMUSCULAR | Status: DC
  Administered 2023-09-30 – 2023-10-01 (×6): 5000 [IU] via SUBCUTANEOUS
  Filled 2023-09-29 (×7): qty 1

## 2023-09-29 MED ORDER — MECLIZINE HCL 25 MG PO TABS: 12.5000 mg | ORAL_TABLET | Freq: Three times a day (TID) | ORAL | Status: DC | PRN

## 2023-09-29 MED ORDER — SODIUM CHLORIDE 0.9% FLUSH
3.0000 mL | Freq: Two times a day (BID) | INTRAVENOUS | Status: DC
  Administered 2023-09-30 – 2023-10-02 (×4): 3 mL via INTRAVENOUS

## 2023-09-29 MED ORDER — NITROFURANTOIN MONOHYD MACRO 100 MG PO CAPS
100.0000 mg | ORAL_CAPSULE | Freq: Two times a day (BID) | ORAL | 0 refills | Status: DC
  Filled 2023-09-29: qty 14, 7d supply, fill #0

## 2023-09-29 MED ORDER — PROPRANOLOL HCL 20 MG PO TABS
80.0000 mg | ORAL_TABLET | Freq: Two times a day (BID) | ORAL | Status: DC
  Administered 2023-09-29 – 2023-10-02 (×6): 80 mg via ORAL
  Filled 2023-09-29 (×3): qty 4
  Filled 2023-09-29: qty 8
  Filled 2023-09-29 (×2): qty 4

## 2023-09-29 MED ORDER — LEVOTHYROXINE SODIUM 25 MCG PO TABS
137.0000 ug | ORAL_TABLET | Freq: Every day | ORAL | Status: DC
  Administered 2023-09-30 – 2023-10-01 (×2): 137 ug via ORAL
  Filled 2023-09-29 (×3): qty 1

## 2023-09-29 MED ORDER — METHYLPREDNISOLONE SODIUM SUCC 125 MG IJ SOLR
125.0000 mg | Freq: Once | INTRAMUSCULAR | Status: AC
  Administered 2023-09-29: 125 mg via INTRAVENOUS
  Filled 2023-09-29: qty 2

## 2023-09-29 MED ORDER — NITROFURANTOIN MONOHYD MACRO 100 MG PO CAPS: 100.0000 mg | ORAL_CAPSULE | Freq: Two times a day (BID) | ORAL | 0 refills | Status: DC

## 2023-09-29 MED ORDER — ATORVASTATIN CALCIUM 10 MG PO TABS
10.0000 mg | ORAL_TABLET | Freq: Every day | ORAL | Status: DC
  Administered 2023-09-30 – 2023-10-02 (×3): 10 mg via ORAL
  Filled 2023-09-29 (×3): qty 1

## 2023-09-29 MED ORDER — FLUCONAZOLE 150 MG PO TABS
ORAL_TABLET | ORAL | 0 refills | Status: AC
Start: 2023-09-29 — End: ?

## 2023-09-29 MED ORDER — SODIUM CHLORIDE 0.9 % IV BOLUS
1000.0000 mL | Freq: Once | INTRAVENOUS | Status: AC
  Administered 2023-09-29: 1000 mL via INTRAVENOUS

## 2023-09-29 MED ORDER — ONDANSETRON 4 MG PO TBDP: 8.0000 mg | ORAL_TABLET | Freq: Three times a day (TID) | ORAL | Status: DC | PRN

## 2023-09-29 MED ORDER — DIPHENHYDRAMINE HCL 50 MG/ML IJ SOLN
25.0000 mg | Freq: Once | INTRAMUSCULAR | Status: AC
  Administered 2023-09-29: 25 mg via INTRAVENOUS
  Filled 2023-09-29: qty 1

## 2023-09-29 MED ORDER — ASPIRIN 81 MG PO TBEC
81.0000 mg | DELAYED_RELEASE_TABLET | Freq: Every day | ORAL | Status: DC
  Administered 2023-09-30 – 2023-10-02 (×3): 81 mg via ORAL
  Filled 2023-09-29 (×3): qty 1

## 2023-09-29 MED ORDER — HYDROCODONE-ACETAMINOPHEN 5-325 MG PO TABS: 1.0000 | ORAL_TABLET | Freq: Four times a day (QID) | ORAL | Status: DC | PRN

## 2023-09-29 MED ORDER — ACETAMINOPHEN 325 MG PO TABS
650.0000 mg | ORAL_TABLET | Freq: Four times a day (QID) | ORAL | Status: DC | PRN
  Administered 2023-10-01 – 2023-10-02 (×3): 650 mg via ORAL
  Filled 2023-09-29 (×3): qty 2

## 2023-09-29 MED ORDER — FAMOTIDINE IN NACL 20-0.9 MG/50ML-% IV SOLN
20.0000 mg | Freq: Once | INTRAVENOUS | Status: AC
  Administered 2023-09-29: 20 mg via INTRAVENOUS
  Filled 2023-09-29: qty 50

## 2023-09-29 MED ORDER — BUPROPION HCL ER (XL) 150 MG PO TB24
150.0000 mg | ORAL_TABLET | Freq: Every morning | ORAL | Status: DC
  Administered 2023-09-30 – 2023-10-02 (×3): 150 mg via ORAL
  Filled 2023-09-29 (×3): qty 1

## 2023-09-29 NOTE — H&P (Signed)
 History and Physical    Patient: Norma Sanchez FMW:[id number] DOB: 1961-02-04 DOA: 09/29/2023 DOS: the patient was seen and examined on 09/29/2023 PCP: Melvenia Manus BRAVO, MD  Patient coming from: Home  Chief Complaint:  Chief Complaint  Patient presents with   Dizziness   HPI: Norma Sanchez is a 63 y.o. female with a history of nephrolithiasis, HTN, HLD, NIDT2DM, obesity, hypothyroidism who presented to the ED this evening with tongue swelling, dizziness, and right abdominal pain. She had been noticing intermittent right abdominal/flank pains beginning > 1 week prior and ultimately presented to the ED for this 8/6 where she was diagnosed with a 17mm obstructing right UPJ stone with associated hydronephrosis and significant right renal edema and perinephric stranding. Urinalysis showed >50RBCs and >50 WBCs with +squamous cells. Afebrile, but leukocytosis noted. Cr 1.3 with mild hyperkalemia treated with lokelma . She was given IV ceftriaxone  and percocet, sent home for outpatient urology follow up as recommended by urology. Saw them today with plans for definitive stone management, though that had not been scheduled. After she took percocet today she noticed tongue swelling to the point of discomfort affecting her speech, also some shaking chills, fatigue, took a nap and felt better, but symptoms recurred when she took another dose and EMS was activated.   In the ED tonight she has a low grade fever, persistent leukocytosis. Case discussed with urology, Dr. Devere, who recommended ureteral stenting at [facility] 8/8, so admission to hospitalist service there is requested.   The patient's tongue now feels normal. She received benadryl , pepcid , solumedrol as well as 1L NS and 2g CTX in ED. Hydrocodone  has worked and been tolerated with left sided nephrolithiasis earlier this year.    Review of Systems: As mentioned in the history of present illness. All other systems reviewed and are negative. Past Medical  History:  Diagnosis Date   Bilateral carpal tunnel syndrome    Biliary dyskinesia 12/07/2011   Fatty liver    GERD (gastroesophageal reflux disease)    Hepatitis B    High cholesterol    History of kidney stones    Hypertension    Hypothyroidism    Multinodular goiter    PONV (postoperative nausea and vomiting)    Pre-diabetes    Skin cancer    wrist and eyebrow   Past Surgical History:  Procedure Laterality Date   ABDOMINAL HYSTERECTOMY  8 yrs ago   1 ovary spared   CARPAL TUNNEL RELEASE  yrs ago   both wrists   CATARACT EXTRACTION W/PHACO Right 05/04/2021   Procedure: CATARACT EXTRACTION PHACO AND INTRAOCULAR LENS PLACEMENT (IOC);  Surgeon: Harrie Agent, MD;  Location: [facility];  Service: Ophthalmology;  Laterality: Right;  CDE: 1.08   CATARACT EXTRACTION W/PHACO Left 05/18/2021   Procedure: CATARACT EXTRACTION PHACO AND INTRAOCULAR LENS PLACEMENT (IOC);  Surgeon: Harrie Agent, MD;  Location: [facility];  Service: Ophthalmology;  Laterality: Left;  CDE: 2.63   CHOLECYSTECTOMY  12/28/2011   Procedure: LAPAROSCOPIC CHOLECYSTECTOMY WITH INTRAOPERATIVE CHOLANGIOGRAM;  Surgeon: Krystal JINNY Russell, MD;  Location: [facility];  Service: General;  Laterality: N/A;  Laparoscopic cholecystectomy with attempted cholangiogram   CYSTOSCOPY/URETEROSCOPY/HOLMIUM LASER/STENT PLACEMENT Left 04/07/2023   Procedure: CYSTOSCOPY LEFT /URETEROSCOPY/HOLMIUM LASER/STENT PLACEMENT AND RETROGRADE PYELOGRAM;  Surgeon: Shane Steffan BROCKS, MD;  Location: [facility];  Service: Urology;  Laterality: Left;  60 MINUTE CASE   KNEE SURGERY  arthroscopic, 3 yrs ago   right knee twice   radioactive iodine  to thyroid '  04/2011   TONSILLECTOMY  age 20   TUBAL LIGATION  yrs ago   Social History:  reports that she has never smoked. She has never used smokeless tobacco. She reports that she does not drink alcohol and does not use drugs.  Allergies  Allergen Reactions   Avelox [Moxifloxacin Hcl In Nacl] Anaphylaxis and Hives     Thrush, throat might have closed    Robaxin  [Methocarbamol ] Swelling and Other (See Comments)    Tongue swelling, stroke-like symptoms   Semaglutide  Shortness Of Breath, Nausea And Vomiting and Rash    Skin peeling   Prednisone Other (See Comments)    Chest pains   Codeine Hives, Itching and Rash   Hydrochlorothiazide Itching    On hands and feet   Levothyroxine  Rash    Only to generic. Not to brand synthroid .    Other Hives and Itching    All Cillins   Penicillins Hives   Sulfa Antibiotics Itching and Rash    Hands and feet itch and turn red   Zostavax [Zoster Vaccine Live] Other (See Comments)    Redness around injection site/knot size of baseball bat/contracted shingles    Family History  Problem Relation Age of Onset   Stroke Mother    Breast cancer Mother 59   Alzheimer's disease Mother    Stroke Father    Heart attack Father    AAA (abdominal aortic aneurysm) Father        rupture, smoker   Alcoholism Sister    Heart attack Brother 80   CAD Brother    Suicidality Maternal Grandmother    Suicidality Maternal Grandfather    Heart attack Paternal Grandmother        1s    Prior to Admission medications   Medication Sig Start Date End Date Taking? Authorizing Provider  aMILoride  (MIDAMOR ) 5 MG tablet Take 1 tablet (5 mg total) by mouth daily. 03/30/23  Yes Cranford, Tonya, NP  Ascorbic Acid (VITAMIN C PO) Take 1 tablet by mouth in the morning.   Yes [provider]  aspirin  EC 81 MG tablet Take 81 mg by mouth in the morning. Swallow whole.   Yes [provider]  aspirin -acetaminophen -caffeine (EXCEDRIN MIGRAINE) 250-250-65 MG tablet Take 2 tablets by mouth every 6 (six) hours as needed for headache or migraine (pain).   Yes [provider]  atorvastatin  (LIPITOR) 10 MG tablet Take 1 tablet (10 mg total) by mouth daily for cholesterol 08/01/23  Yes Melvenia Manus BRAVO, MD  buPROPion  (WELLBUTRIN  XL) 150 MG 24 hr tablet Take 1 tablet (150 mg  total) by mouth every morning. 09/05/23  Yes Bevely Doffing, FNP  candesartan  (ATACAND ) 32 MG tablet Take 1 tablet (32 mg total) by mouth nightly for blood pressure 08/01/23  Yes Melvenia Manus BRAVO, MD  cetirizine (ZYRTEC) 10 MG tablet Take 10 mg by mouth in the morning.   Yes [provider]  escitalopram  (LEXAPRO ) 20 MG tablet Take 1 tablet (20 mg total) by mouth every evening. 09/05/23  Yes Bevely Doffing, FNP  esomeprazole  (NEXIUM ) 40 MG capsule Take 1 capsule (40 mg total) by mouth daily to prevent heartburn & indigestion 09/05/23  Yes Bevely Doffing, FNP  fluconazole  (DIFLUCAN ) 150 MG tablet Take once. May repeat in 3 days if symptoms fail to fully resolve after first dose. Patient taking differently: Take 150 mg by mouth 3 days. Take once. May repeat in 3 days if symptoms fail to fully resolve after first dose. 09/29/23  Yes Gerldine Lauraine BROCKS, FNP  fluticasone  (  FLONASE ) 50 MCG/ACT nasal spray Place 1 spray into both nostrils daily. Patient taking differently: Place 1 spray into both nostrils at bedtime. 11/02/18  Yes Craig Palma R, PA-C  meclizine  (ANTIVERT ) 12.5 MG tablet Take 1 tablet (12.5 mg total) by mouth 3 (three) times daily as needed for dizziness. 09/20/23  Yes Leath-Warren, Etta PARAS, NP  metFORMIN  (GLUCOPHAGE -XR) 500 MG 24 hr tablet Take 2 tablets (1,000 mg total) by mouth 2 (two) times daily. 08/01/23  Yes Melvenia Manus BRAVO, MD  nitrofurantoin , macrocrystal-monohydrate, (MACROBID ) 100 MG capsule Take 1 capsule (100 mg total) by mouth 2 (two) times daily for 7 days. 09/29/23 10/06/23 Yes Larocco, Lauraine BROCKS, FNP  ondansetron  (ZOFRAN -ODT) 8 MG disintegrating tablet Take 1 tablet (8 mg total) by mouth every 8 (eight) hours as needed for nausea or vomiting. 09/29/23  Yes Gerldine Lauraine BROCKS, FNP  oxyCODONE -acetaminophen  (PERCOCET/ROXICET) 5-325 MG tablet Take 1 tablet by mouth every 6 (six) hours as needed for severe pain (pain score 7-10). 09/29/23  Yes Gerldine Lauraine BROCKS, FNP  phentermine  (ADIPEX-P )  37.5 MG tablet Take 1 tablet (37.5 mg total) by mouth daily before breakfast. 09/02/23  Yes Bevely Doffing, FNP  propranolol  (INDERAL ) 80 MG tablet Take 1 tablet (80 mg total) by mouth in the morning and in the evening for blood pressure. 09/05/23  Yes Bevely Doffing, FNP  SYNTHROID  137 MCG tablet Take 1 tablet (137 mcg total) by mouth daily before breakfast. 07/07/23  Yes Tobie Suzzane POUR, MD    Physical Exam: Vitals:   09/29/23 1949 09/29/23 1951  BP:  118/80  Pulse:  92  Resp:  17  Temp:  100.3 F (37.9 C)  TempSrc:  Oral  SpO2:  95%  Weight: 98.9 kg   Height: 5' (1.524 m)   Gen: Pleasant female in no distress Pulm: Clear, nonlabored  CV: RRR, no MRG, no pitting edema GI: Soft, NT, ND, +BS. +R flank tenderness to percussion. No rebound or guarding. Neuro: Alert and oriented. No new focal deficits. Ext: Warm, no deformities. Skin: No rashes, lesions or ulcers on visualized skin   Data Reviewed: K 5.3, Na 133, bicarb 20, SCr 2.04 (from 1.31), BUN 27.  Lactic acid 1.5 WBC 16.9k (from 20k), neutrophilia. Eosinophils normal.  Blood cultures drawn.   Assessment and Plan: Obstructive right ureterolithiasis: 17mm at UPJ with hydronephrosis, renal edema and perinephric stranding. In setting of leukocytosis, pyuria, and now low grade fever, high concern for developing sepsis. Lactic acid reassuring.  - Continue ceftriaxone  2g IV q24h. No culture sent yesterday and has now had 2 doses of antibiotics. Will follow blood cultures. Prior urine cultures reviewed including E. coli Dec 2017 pansensitive, Klebsiella Nov 2016 resistant to amp and intermediate to macrobid . Note PCN, sulfa, and avelox allergies.  - NPO p MN, urology to formally evaluate for stenting 8/8.  - Use tylenol  prn mild pain/fever and hydrocodone  prn mod-severe pain (limited options given suspected allergy to percocet and several others as listed above).   Obstructive uropathy, AKI with hyperkalemia, NAGMA: SCr significantly up  today from yesterday.   - Continue IVF with normal saline and recheck metabolic profile in AM. - Avoid nephrotoxins, hold ARB, amiloride  (both likely contributing to hyperkalemia) and metformin .  - Lokelma  x1.  - Monitor UOP  NIDT2DM:  - Sensitive SSI (monitoring for steroid-induced hyperglycemia), hold metformin   HTN:  - Holding amiloride , candesartan , can continue propranolol   HLD:  - Continue atorvastatin    Depression: Quiescent.  - Continue lexapro , bupropion    Hypothyroidism:  -  Continue synthroid     Obesity: Had dermal allergy to ozempic .  - Hold phentermine  for now  Advance Care Planning: Full code  Consults: Urology, Dr. Devere discussed with EDP Dr. Cleotilde.  Family Communication: Spouse at bedside  Severity of Illness: The appropriate patient status for this patient is INPATIENT. Inpatient status is judged to be reasonable and necessary in order to provide the required intensity of service to ensure the patient's safety. The patient's presenting symptoms, physical exam findings, and initial radiographic and laboratory data in the context of their chronic comorbidities is felt to place them at high risk for further clinical deterioration. Furthermore, it is not anticipated that the patient will be medically stable for discharge from the hospital within 2 midnights of admission.   * I certify that at the point of admission it is my clinical judgment that the patient will require inpatient hospital care spanning beyond 2 midnights from the point of admission due to high intensity of service, high risk for further deterioration and high frequency of surveillance required.*  Author: Bernardino KATHEE Come, MD 09/29/2023 10:15 PM  For on call review [email or web address].

## 2023-09-29 NOTE — H&P (View-Only) (Signed)
 Name: Norma Sanchez DOB: 02-28-1960 MRN: [id number]  History of Present Illness: [name] is a 63 y.o. female who presents today as a new patient at [facility]. All available relevant medical records have been reviewed. Relevant History includes: 1. Kidney stone(s). - 04/07/2023: Left ureteroscopic stone manipulation by Dr. Shane.  She reports concern of kidney stone(s).   Recent history: > 09/28/2023: Seen in ER for right flank pain. Renal function: GFR 46; creatinine 1.31. CBC with leukocytosis (WBC 20.0). UA showed >50 WBC/hpf, >50 RBC/hpf, rare bacteria. CT showed an obstructing 17 mm right UPJ stone along with moderate right hydronephrosis with significant right renal edema and perinephric fat stranding.  She was treated with IV Rocephin  1 gram and discharged with prescription for Percocet 5-325 mg.  Today: She denies flank pain; reports RLQ abdominal pain. She denies fevers; reports some chills, fatigue, and nausea.  She denies increased urinary urgency, frequency, hesitancy, straining to void, or sensations of incomplete emptying. Reports nocturia x1-2. Denies dysuria and gross hematuria.  Medications: Current Outpatient Medications  Medication Sig Dispense Refill   acetaminophen  (TYLENOL ) 325 MG tablet Take 2 tablets (650 mg total) by mouth every 6 (six) hours as needed. 36 tablet 0   aMILoride  (MIDAMOR ) 5 MG tablet Take 1 tablet (5 mg total) by mouth daily. 90 tablet 1   Ascorbic Acid (VITAMIN C PO) Take 1 tablet by mouth in the morning.     aspirin  EC 81 MG tablet Take 81 mg by mouth in the morning. Swallow whole.     atorvastatin  (LIPITOR) 10 MG tablet Take 1 tablet (10 mg total) by mouth daily for cholesterol 90 tablet 3   buPROPion  (WELLBUTRIN  XL) 150 MG 24 hr tablet Take 1 tablet (150 mg total) by mouth every morning. 90 tablet 3   candesartan  (ATACAND ) 32 MG tablet Take 1 tablet (32 mg total) by mouth nightly for blood pressure 90 tablet 1    cetirizine (ZYRTEC) 10 MG tablet Take 10 mg by mouth in the morning.     escitalopram  (LEXAPRO ) 20 MG tablet Take 1 tablet (20 mg total) by mouth every evening. 90 tablet 1   esomeprazole  (NEXIUM ) 40 MG capsule Take 1 capsule (40 mg total) by mouth daily to prevent heartburn & indigestion 90 capsule 1   fluconazole  (DIFLUCAN ) 150 MG tablet Take once. May repeat in 3 days if symptoms fail to fully resolve after first dose. 2 tablet 0   fluticasone  (FLONASE ) 50 MCG/ACT nasal spray Place 1 spray into both nostrils daily. (Patient taking differently: Place 1 spray into both nostrils at bedtime.) 48 g 2   meclizine  (ANTIVERT ) 12.5 MG tablet Take 1 tablet (12.5 mg total) by mouth 3 (three) times daily as needed for dizziness. 30 tablet 0   metFORMIN  (GLUCOPHAGE -XR) 500 MG 24 hr tablet Take 2 tablets (1,000 mg total) by mouth 2 (two) times daily. 360 tablet 95   nitrofurantoin , macrocrystal-monohydrate, (MACROBID ) 100 MG capsule Take 1 capsule (100 mg total) by mouth 2 (two) times daily for 7 days. 14 capsule 0   ondansetron  (ZOFRAN -ODT) 8 MG disintegrating tablet Take 1 tablet (8 mg total) by mouth every 8 (eight) hours as needed for nausea or vomiting. 20 tablet 1   phentermine  (ADIPEX-P ) 37.5 MG tablet Take 1 tablet (37.5 mg total) by mouth daily before breakfast. 30 tablet 0   propranolol  (INDERAL ) 80 MG tablet Take 1 tablet (80 mg total) by mouth in the morning and in the evening for blood  pressure. 180 tablet 1   SYNTHROID  137 MCG tablet Take 1 tablet (137 mcg total) by mouth daily before breakfast. 90 tablet 1   oxyCODONE -acetaminophen  (PERCOCET/ROXICET) 5-325 MG tablet Take 1 tablet by mouth every 6 (six) hours as needed for severe pain (pain score 7-10). 20 tablet 0   No current facility-administered medications for this visit.    Allergies: Allergies  Allergen Reactions   Semaglutide  Shortness Of Breath, Nausea And Vomiting and Rash    Skin peeling   Avelox [Moxifloxacin Hcl In Nacl] Hives     Thrush, throat might have closed    Hydrochlorothiazide Itching    On hands and feet   Other Itching    All Cillins, hives alao   Penicillins     Hives   Prednisone     Chest pains   Zostavax [Zoster Vaccine Live] Other (See Comments)    Redness around injection site/knot size of baseball bat/contracted shingles   Codeine Hives, Itching and Rash   Levothyroxine  Rash    Only to generic. Not to brand synthroid .    Sulfa Antibiotics Itching and Rash    Hands and feet itch and turn red    Past Medical History:  Diagnosis Date   Bilateral carpal tunnel syndrome    Biliary dyskinesia 12/07/2011   Fatty liver    GERD (gastroesophageal reflux disease)    Hepatitis B    High cholesterol    History of kidney stones    Hypertension    Hypothyroidism    Multinodular goiter    PONV (postoperative nausea and vomiting)    Pre-diabetes    Skin cancer    wrist and eyebrow   Past Surgical History:  Procedure Laterality Date   ABDOMINAL HYSTERECTOMY  8 yrs ago   1 ovary spared   CARPAL TUNNEL RELEASE  yrs ago   both wrists   CATARACT EXTRACTION W/PHACO Right 05/04/2021   Procedure: CATARACT EXTRACTION PHACO AND INTRAOCULAR LENS PLACEMENT (IOC);  Surgeon: Harrie Agent, MD;  Location: [facility];  Service: Ophthalmology;  Laterality: Right;  CDE: 1.08   CATARACT EXTRACTION W/PHACO Left 05/18/2021   Procedure: CATARACT EXTRACTION PHACO AND INTRAOCULAR LENS PLACEMENT (IOC);  Surgeon: Harrie Agent, MD;  Location: [facility];  Service: Ophthalmology;  Laterality: Left;  CDE: 2.63   CHOLECYSTECTOMY  12/28/2011   Procedure: LAPAROSCOPIC CHOLECYSTECTOMY WITH INTRAOPERATIVE CHOLANGIOGRAM;  Surgeon: Krystal JINNY Russell, MD;  Location: [facility];  Service: General;  Laterality: N/A;  Laparoscopic cholecystectomy with attempted cholangiogram   CYSTOSCOPY/URETEROSCOPY/HOLMIUM LASER/STENT PLACEMENT Left 04/07/2023   Procedure: CYSTOSCOPY LEFT /URETEROSCOPY/HOLMIUM LASER/STENT PLACEMENT AND RETROGRADE  PYELOGRAM;  Surgeon: Shane Steffan BROCKS, MD;  Location: [facility];  Service: Urology;  Laterality: Left;  60 MINUTE CASE   KNEE SURGERY  arthroscopic, 3 yrs ago   right knee twice   radioactive iodine  to thyroid '  04/2011   TONSILLECTOMY  age 6   TUBAL LIGATION  yrs ago   Family History  Problem Relation Age of Onset   Stroke Mother    Breast cancer Mother 29   Alzheimer's disease Mother    Stroke Father    Heart attack Father    AAA (abdominal aortic aneurysm) Father        rupture, smoker   Alcoholism Sister    Heart attack Brother 79   CAD Brother    Suicidality Maternal Grandmother    Suicidality Maternal Grandfather    Heart attack Paternal Grandmother        26s   Social  History   Socioeconomic History   Marital status: Married    Spouse name: Not on file   Number of children: Not on file   Years of education: Not on file   Highest education level: 12th grade  Occupational History   Not on file  Tobacco Use   Smoking status: Never   Smokeless tobacco: Never  Vaping Use   Vaping status: Never Used  Substance and Sexual Activity   Alcohol use: No   Drug use: No   Sexual activity: Yes    Partners: Male    Birth control/protection: Surgical  Other Topics Concern   Not on file  Social History Narrative   Not on file   Social Drivers of Health   Financial Resource Strain: Low Risk  (06/07/2023)   Overall Financial Resource Strain (CARDIA)    Difficulty of Paying Living Expenses: Not hard at all  Food Insecurity: No Food Insecurity (06/07/2023)   Hunger Vital Sign    Worried About Running Out of Food in the Last Year: Never true    Ran Out of Food in the Last Year: Never true  Transportation Needs: No Transportation Needs (06/07/2023)   PRAPARE - Administrator, Civil Service (Medical): No    Lack of Transportation (Non-Medical): No  Physical Activity: Unknown (06/07/2023)   Exercise Vital Sign    Days of Exercise per Week: 0 days    Minutes of  Exercise per Session: Not on file  Stress: No Stress Concern Present (06/07/2023)   [employer] of Occupational Health - Occupational Stress Questionnaire    Feeling of Stress : Not at all  Social Connections: Moderately Integrated (06/07/2023)   Social Connection and Isolation Panel    Frequency of Communication with Friends and Family: More than three times a week    Frequency of Social Gatherings with Friends and Family: Once a week    Attends Religious Services: More than 4 times per year    Active Member of [employer] or Organizations: No    Attends Engineer, structural: Not on file    Marital Status: Married  Catering manager Violence: Not on file    SUBJECTIVE  Review of Systems Constitutional: Patient denies any unintentional weight loss or change in strength lntegumentary: Patient denies any rashes or pruritus Cardiovascular: Patient denies chest pain or syncope Respiratory: Patient denies shortness of breath Gastrointestinal: As per HPI Musculoskeletal: Patient denies muscle cramps or weakness Neurologic: Patient denies convulsions or seizures Allergic/Immunologic: Patient denies recent allergic reaction(s) Hematologic/Lymphatic: Patient denies bleeding tendencies Endocrine: Patient denies heat/cold intolerance  GU: As per HPI.  OBJECTIVE Vitals:   09/29/23 1009  BP: (!) 78/49  Pulse: 93   There is no height or weight on file to calculate BMI.  Physical Examination Constitutional: No obvious distress; patient is non-toxic appearing  Cardiovascular: No visible lower extremity edema.  Respiratory: The patient does not have audible wheezing/stridor; respirations do not appear labored  Gastrointestinal: Abdomen non-distended Musculoskeletal: Normal ROM of UEs  Skin: No obvious rashes/open sores  Neurologic: CN 2-12 grossly intact Psychiatric: Answered questions appropriately with normal affect  Hematologic/Lymphatic/Immunologic: No obvious bruises or  sites of spontaneous bleeding  Urine microscopy: >30 WBC/hpf, 3-10 RBC/hpf, few bacteria  ASSESSMENT Right ureteral stone - Plan: Urinalysis, Routine w reflex microscopic, Ambulatory Referral For Surgery Scheduling, Urine Culture, ondansetron  (ZOFRAN -ODT) 8 MG disintegrating tablet, nitrofurantoin , macrocrystal-monohydrate, (MACROBID ) 100 MG capsule, fluconazole  (DIFLUCAN ) 150 MG tablet, oxyCODONE -acetaminophen  (PERCOCET/ROXICET) 5-325 MG tablet, DISCONTINUED: nitrofurantoin , macrocrystal-monohydrate, (MACROBID )  100 MG capsule  Hydronephrosis of right kidney - Plan: Ambulatory Referral For Surgery Scheduling, Urine Culture, nitrofurantoin , macrocrystal-monohydrate, (MACROBID ) 100 MG capsule, oxyCODONE -acetaminophen  (PERCOCET/ROXICET) 5-325 MG tablet, DISCONTINUED: ondansetron  (ZOFRAN -ODT) 4 MG disintegrating tablet, DISCONTINUED: nitrofurantoin , macrocrystal-monohydrate, (MACROBID ) 100 MG capsule  Right flank pain - Plan: Ambulatory Referral For Surgery Scheduling, Urine Culture, nitrofurantoin , macrocrystal-monohydrate, (MACROBID ) 100 MG capsule, oxyCODONE -acetaminophen  (PERCOCET/ROXICET) 5-325 MG tablet, DISCONTINUED: nitrofurantoin , macrocrystal-monohydrate, (MACROBID ) 100 MG capsule  Kidney stones - Plan: Ambulatory Referral For Surgery Scheduling, Urine Culture, nitrofurantoin , macrocrystal-monohydrate, (MACROBID ) 100 MG capsule, oxyCODONE -acetaminophen  (PERCOCET/ROXICET) 5-325 MG tablet, DISCONTINUED: nitrofurantoin , macrocrystal-monohydrate, (MACROBID ) 100 MG capsule  Abnormal urinalysis - Plan: Urine Culture, nitrofurantoin , macrocrystal-monohydrate, (MACROBID ) 100 MG capsule, fluconazole  (DIFLUCAN ) 150 MG tablet, DISCONTINUED: nitrofurantoin , macrocrystal-monohydrate, (MACROBID ) 100 MG capsule  Nausea - Plan: ondansetron  (ZOFRAN -ODT) 8 MG disintegrating tablet, DISCONTINUED: ondansetron  (ZOFRAN -ODT) 4 MG disintegrating tablet  Antibiotic-induced yeast infection - Plan: fluconazole   (DIFLUCAN ) 150 MG tablet, DISCONTINUED: fluconazole  (DIFLUCAN ) 150 MG tablet  Leukocytosis, unspecified type - Plan: Urine Culture, nitrofurantoin , macrocrystal-monohydrate, (MACROBID ) 100 MG capsule, fluconazole  (DIFLUCAN ) 150 MG tablet, DISCONTINUED: nitrofurantoin , macrocrystal-monohydrate, (MACROBID ) 100 MG capsule  We reviewed recent imaging results. For acute GU stone symptoms we agreed to proceed with: - For pain management, we discussed the use of OTC analgesics versus opioids as prescribed at ER  - For nausea / vomiting: Zofran  prescription sent   We discussed the various treatment options including  - extracorporeal shock wave lithotripsy (ESWL) - ureteroscopic stone manipulation (URS)  We discussed possible risks and benefits of intervention including but not limited to: including pain, infection, sepsis, UTI, ureter perforation, need for stenting, post-op ureteral stricture, hematuria, possible need for follow up procedures.  She elected to proceed with URS; surgery order placed.   Abnormal UA. Will check urine culture and treat empirically with Macrobid  while awaiting culture results and sensitivities. She reports history of antibiotic-induced yeast vaginitis therefore Diflucan  will also be prescribed.   She was advised to contact urology provider or go to the ER if She develops fever >101F, uncontrollable pain, or other significantly concerning symptoms prior to follow up.  Patient verbalized understanding and agreement. All questions were answered.   PLAN Advised the following: Urine culture. Macrobid  (Nitrofurantoin ) 100 mg 2x/day for 7 days. Analgesics PRN for pain. Zofran  PRN for nausea. Diflucan  PRN if antibiotic-induced yeast vaginitis occurs. Return for surgery.  Orders Placed This Encounter  Procedures   Urine Culture   Urinalysis, Routine w reflex microscopic   Ambulatory Referral For Surgery Scheduling    Referral Priority:   Routine    Referral Type:    Consultation    Number of Visits Requested:   1    It has been explained that the patient is to follow regularly with their PCP in addition to all other providers involved in their care and to follow instructions provided by these respective offices. Patient advised to contact urology clinic if any urologic-pertaining questions, concerns, new symptoms or problems arise in the interim period.  There are no Patient Instructions on file for this visit.  Electronically signed by: Lauraine KYM Oz, MSN, FNP-C, CUNP 09/29/2023 11:04 AM

## 2023-09-29 NOTE — ED Provider Notes (Signed)
 [place] [facility]PARTMENT AT [facility] Provider Note   CSN: [id number] Arrival date & time: 09/29/23  8057     Patient presents with: Dizziness   Norma Sanchez is a 63 y.o. female.  {Add pertinent medical, surgical, social history, OB history to HPI:32947}  Dizziness  This patient is a 63 year old female, she has a medical history significant for high cholesterol on Lipitor, she takes bupropion , Lexapro , Nexium , propranolol  and Synthroid  as well as phentermine .  She has recently been dx with kidney stones and has had a visit to the ER last night because of flank pain and nausea, she was given Dilaudid  and Toradol  in the ED, she felt better and was given a Percocet to go pack for home.  This morning she took a Percocet, followed by another Percocet later in the afternoon and developed a feeling of her tongue swelling, extremely jittery and shaking all over, having difficulty talking because of her tongue swelling which prompted them to call 911 for paramedic transport into the ER.  She did see the urologist yesterday and they are planning to hopefully do some type of stone retrieval or removal in the future though it is not scheduled.  At the time of her visit yesterday she had a white blood cell count of 20,000, neutrophil predominance, metabolic panel at that time showed that she had a creatinine of 1.3 which was close to her baseline, mild hyperkalemia for which she was treated with Lokelma .  A CT scan renal stone study yesterday showed that the patient had an obstructing 17 mm right UPJ calculus with moderate right hydronephrosis  Urinalysis showed lots of white blood cells       Prior to Admission medications   Medication Sig Start Date End Date Taking? Authorizing Provider  acetaminophen  (TYLENOL ) 325 MG tablet Take 2 tablets (650 mg total) by mouth every 6 (six) hours as needed. 04/03/23   Elnor Jayson LABOR, DO  aMILoride  (MIDAMOR ) 5 MG tablet Take 1 tablet (5 mg  total) by mouth daily. 03/30/23   Cranford, Tonya, NP  Ascorbic Acid (VITAMIN C PO) Take 1 tablet by mouth in the morning.    [provider]  aspirin  EC 81 MG tablet Take 81 mg by mouth in the morning. Swallow whole.    [provider]  atorvastatin  (LIPITOR) 10 MG tablet Take 1 tablet (10 mg total) by mouth daily for cholesterol 08/01/23   Melvenia Manus BRAVO, MD  buPROPion  (WELLBUTRIN  XL) 150 MG 24 hr tablet Take 1 tablet (150 mg total) by mouth every morning. 09/05/23   Bevely Doffing, FNP  candesartan  (ATACAND ) 32 MG tablet Take 1 tablet (32 mg total) by mouth nightly for blood pressure 08/01/23   Melvenia Manus BRAVO, MD  cetirizine (ZYRTEC) 10 MG tablet Take 10 mg by mouth in the morning.    [provider]  escitalopram  (LEXAPRO ) 20 MG tablet Take 1 tablet (20 mg total) by mouth every evening. 09/05/23   Bevely Doffing, FNP  esomeprazole  (NEXIUM ) 40 MG capsule Take 1 capsule (40 mg total) by mouth daily to prevent heartburn & indigestion 09/05/23   Bevely Doffing, FNP  fluconazole  (DIFLUCAN ) 150 MG tablet Take once. May repeat in 3 days if symptoms fail to fully resolve after first dose. 09/29/23   Gerldine Lauraine BROCKS, FNP  fluticasone  (FLONASE ) 50 MCG/ACT nasal spray Place 1 spray into both nostrils daily. Patient taking differently: Place 1 spray into both nostrils at bedtime. 11/02/18   Craig Alan SAUNDERS,  PA-C  meclizine  (ANTIVERT ) 12.5 MG tablet Take 1 tablet (12.5 mg total) by mouth 3 (three) times daily as needed for dizziness. 09/20/23   Leath-Warren, Etta PARAS, NP  metFORMIN  (GLUCOPHAGE -XR) 500 MG 24 hr tablet Take 2 tablets (1,000 mg total) by mouth 2 (two) times daily. 08/01/23   Melvenia Manus BRAVO, MD  nitrofurantoin , macrocrystal-monohydrate, (MACROBID ) 100 MG capsule Take 1 capsule (100 mg total) by mouth 2 (two) times daily for 7 days. 09/29/23 10/06/23  Gerldine Lauraine BROCKS, FNP  ondansetron  (ZOFRAN -ODT) 8 MG disintegrating tablet Take 1 tablet (8 mg total) by mouth every 8 (eight)  hours as needed for nausea or vomiting. 09/29/23   Gerldine Lauraine BROCKS, FNP  oxyCODONE -acetaminophen  (PERCOCET/ROXICET) 5-325 MG tablet Take 1 tablet by mouth every 6 (six) hours as needed for severe pain (pain score 7-10). 09/29/23   Gerldine Lauraine BROCKS, FNP  phentermine  (ADIPEX-P ) 37.5 MG tablet Take 1 tablet (37.5 mg total) by mouth daily before breakfast. 09/02/23   Bevely Doffing, FNP  propranolol  (INDERAL ) 80 MG tablet Take 1 tablet (80 mg total) by mouth in the morning and in the evening for blood pressure. 09/05/23   Bevely Doffing, FNP  SYNTHROID  137 MCG tablet Take 1 tablet (137 mcg total) by mouth daily before breakfast. 07/07/23   Tobie Suzzane POUR, MD    Allergies: Avelox [moxifloxacin hcl in nacl], Semaglutide , Prednisone, Codeine, Hydrochlorothiazide, Levothyroxine , Other, Penicillins, Sulfa antibiotics, and Zostavax [zoster vaccine live]    Review of Systems  Neurological:  Positive for dizziness.  All other systems reviewed and are negative.   Updated Vital Signs BP 118/80 (BP Location: Left Arm)   Pulse 92   Temp 100.3 F (37.9 C) (Oral)   Resp 17   Ht 1.524 m (5')   Wt 98.9 kg   SpO2 95%   BMI 42.58 kg/m   Physical Exam Vitals and nursing note reviewed.  Constitutional:      General: She is not in acute distress.    Appearance: She is well-developed.  HENT:     Head: Normocephalic and atraumatic.     Mouth/Throat:     Pharynx: No oropharyngeal exudate.  Eyes:     General: No scleral icterus.       Right eye: No discharge.        Left eye: No discharge.     Conjunctiva/sclera: Conjunctivae normal.     Pupils: Pupils are equal, round, and reactive to light.  Neck:     Thyroid : No thyromegaly.     Vascular: No JVD.  Cardiovascular:     Rate and Rhythm: Normal rate and regular rhythm.     Heart sounds: Normal heart sounds. No murmur heard.    No friction rub. No gallop.  Pulmonary:     Effort: Pulmonary effort is normal. No respiratory distress.     Breath sounds:  Normal breath sounds. No wheezing or rales.  Abdominal:     General: Bowel sounds are normal. There is no distension.     Palpations: Abdomen is soft. There is no mass.     Tenderness: There is no abdominal tenderness.  Musculoskeletal:        General: No tenderness. Normal range of motion.     Cervical back: Normal range of motion and neck supple.     Right lower leg: No edema.     Left lower leg: No edema.  Lymphadenopathy:     Cervical: No cervical adenopathy.  Skin:    General: Skin is warm  and dry.     Findings: No erythema or rash.  Neurological:     Mental Status: She is alert.     Coordination: Coordination normal.  Psychiatric:        Behavior: Behavior normal.     (all labs ordered are listed, but only abnormal results are displayed) Labs Reviewed - No data to display  EKG: None  Radiology: CT RENAL STONE STUDY Result Date: 09/28/2023 CLINICAL DATA:  Nausea, known right-sided kidney stone, abdominal pain EXAM: CT ABDOMEN AND PELVIS WITHOUT CONTRAST TECHNIQUE: Multidetector CT imaging of the abdomen and pelvis was performed following the standard protocol without IV contrast. RADIATION DOSE REDUCTION: This exam was performed according to the departmental dose-optimization program which includes automated exposure control, adjustment of the mA and/or kV according to patient size and/or use of iterative reconstruction technique. COMPARISON:  04/03/2023 FINDINGS: Lower chest: No acute pleural or parenchymal lung disease. Hepatobiliary: Cholecystectomy. Unremarkable unenhanced appearance of the liver. No biliary duct dilation. Pancreas: Unremarkable unenhanced appearance. Spleen: Unremarkable unenhanced appearance. Adrenals/Urinary Tract: There is an obstructing 17 mm right UPJ calculus, reference image 42/2. Moderate right-sided hydronephrosis, with significant right renal edema and perinephric fat stranding also noted. The left kidney is unremarkable. The adrenals and bladder are  normal. Stomach/Bowel: No bowel obstruction or ileus. Normal appendix right mid abdomen. Scattered distal colonic diverticulosis without diverticulitis. No bowel wall thickening or inflammatory change. Vascular/Lymphatic: Aortic atherosclerosis. No enlarged abdominal or pelvic lymph nodes. Reproductive: Status post hysterectomy. No adnexal masses. Other: No free fluid or free intraperitoneal gas. No abdominal wall hernia. Musculoskeletal: No acute or destructive bony abnormalities. Reconstructed images demonstrate no additional findings. IMPRESSION: 1. Obstructing 17 mm right UPJ calculus, with moderate right hydronephrosis and significant right perinephric fat stranding. 2.  Aortic Atherosclerosis (ICD10-I70.0). Electronically Signed   By: Ozell Daring M.D.   On: 09/28/2023 21:11    {Document cardiac monitor, telemetry assessment procedure when appropriate:32947} Procedures   Medications Ordered in the ED  methylPREDNISolone  sodium succinate (SOLU-MEDROL ) 125 mg/2 mL injection 125 mg (has no administration in time range)  famotidine  (PEPCID ) IVPB 20 mg premix (has no administration in time range)  diphenhydrAMINE  (BENADRYL ) injection 25 mg (has no administration in time range)      {Click here for ABCD2, HEART and other calculators REFRESH Note before signing:1}                              Medical Decision Making Risk Prescription drug management.    This patient presents to the ED for concern of worsening symptoms related to her general wellbeing with shaking, chills, dizziness and nausea, possibly a side effect however she does have a temperature of 100.3 with a known obstructing kidney stone and urinalysis which has been found to have lots of leukocytes and possible infection, this involves an extensive number of treatment options, and is a complaint that carries with it a high risk of complications and morbidity.  The differential diagnosis includes infected stone, side effect of  medications   Co morbidities / Chronic conditions that complicate the patient evaluation  Recurrent kidney stones hypertension hyperlipidemia   Additional history obtained:  Additional history obtained from EMR External records from outside source obtained and reviewed including CT scan from yesterday as well as urology notes   Lab Tests:  I Ordered, and personally interpreted labs.  The pertinent results include: Urinalysis from office shows lots of white blood cells  Cardiac Monitoring: / EKG:  The patient was maintained on a cardiac monitor.  I personally viewed and interpreted the cardiac monitored which showed an underlying rhythm of: Borderline tachycardia 95 to 100 bpm   Problem List / ED Course / Critical interventions / Medication management  Patient presents with fever and worsening symptoms although they are somewhat intermittent and could be a side effect of the Percocet I am also concerned about this being an infected stone and developing sepsis.  I discussed the case with Dr. Devere with the urology service who recommends the patient be transferred to [facility] long on the hospitalist service with antibiotics and n.p.o. after midnight for possible stent I ordered medication including Rocephin , fluids Reevaluation of the patient after these medicines showed that the patient improving I have reviewed the patients home medicines and have made adjustments as needed   Consultations Obtained:  I requested consultation with the hospitalist,  and discussed lab and imaging findings as well as pertinent plan - they recommend: Admission and transfer to [facility]   Social Determinants of Health:  Recurrent stones   Test / Admission - Considered:  Will need to be admitted   {Document critical care time when appropriate  Document review of labs and clinical decision tools ie [id number], etc  Document your independent review of radiology images and any outside  records  Document your discussion with family members, caretakers and with consultants  Document social determinants of health affecting pt's care  Document your decision making why or why not admission, treatments were needed:32947:::1}   Final diagnoses:  None    ED Discharge Orders     None

## 2023-09-29 NOTE — ED Notes (Signed)
[facility] called for Transport.

## 2023-09-29 NOTE — Progress Notes (Signed)
 Name: Norma Sanchez DOB: 02-28-1960 MRN: [id number]  History of Present Illness: Norma Sanchez is a 63 y.o. female who presents today as a new patient at [facility]. All available relevant medical records have been reviewed. Relevant History includes: 1. Kidney stone(s). - 04/07/2023: Left ureteroscopic stone manipulation by Dr. Shane.  She reports concern of kidney stone(s).   Recent history: > 09/28/2023: Seen in ER for right flank pain. Renal function: GFR 46; creatinine 1.31. CBC with leukocytosis (WBC 20.0). UA showed >50 WBC/hpf, >50 RBC/hpf, rare bacteria. CT showed an obstructing 17 mm right UPJ stone along with moderate right hydronephrosis with significant right renal edema and perinephric fat stranding.  She was treated with IV Rocephin  1 gram and discharged with prescription for Percocet 5-325 mg.  Today: She denies flank pain; reports RLQ abdominal pain. She denies fevers; reports some chills, fatigue, and nausea.  She denies increased urinary urgency, frequency, hesitancy, straining to void, or sensations of incomplete emptying. Reports nocturia x1-2. Denies dysuria and gross hematuria.  Medications: Current Outpatient Medications  Medication Sig Dispense Refill   acetaminophen  (TYLENOL ) 325 MG tablet Take 2 tablets (650 mg total) by mouth every 6 (six) hours as needed. 36 tablet 0   aMILoride  (MIDAMOR ) 5 MG tablet Take 1 tablet (5 mg total) by mouth daily. 90 tablet 1   Ascorbic Acid (VITAMIN C PO) Take 1 tablet by mouth in the morning.     aspirin  EC 81 MG tablet Take 81 mg by mouth in the morning. Swallow whole.     atorvastatin  (LIPITOR) 10 MG tablet Take 1 tablet (10 mg total) by mouth daily for cholesterol 90 tablet 3   buPROPion  (WELLBUTRIN  XL) 150 MG 24 hr tablet Take 1 tablet (150 mg total) by mouth every morning. 90 tablet 3   candesartan  (ATACAND ) 32 MG tablet Take 1 tablet (32 mg total) by mouth nightly for blood pressure 90 tablet 1    cetirizine (ZYRTEC) 10 MG tablet Take 10 mg by mouth in the morning.     escitalopram  (LEXAPRO ) 20 MG tablet Take 1 tablet (20 mg total) by mouth every evening. 90 tablet 1   esomeprazole  (NEXIUM ) 40 MG capsule Take 1 capsule (40 mg total) by mouth daily to prevent heartburn & indigestion 90 capsule 1   fluconazole  (DIFLUCAN ) 150 MG tablet Take once. May repeat in 3 days if symptoms fail to fully resolve after first dose. 2 tablet 0   fluticasone  (FLONASE ) 50 MCG/ACT nasal spray Place 1 spray into both nostrils daily. (Patient taking differently: Place 1 spray into both nostrils at bedtime.) 48 g 2   meclizine  (ANTIVERT ) 12.5 MG tablet Take 1 tablet (12.5 mg total) by mouth 3 (three) times daily as needed for dizziness. 30 tablet 0   metFORMIN  (GLUCOPHAGE -XR) 500 MG 24 hr tablet Take 2 tablets (1,000 mg total) by mouth 2 (two) times daily. 360 tablet 95   nitrofurantoin , macrocrystal-monohydrate, (MACROBID ) 100 MG capsule Take 1 capsule (100 mg total) by mouth 2 (two) times daily for 7 days. 14 capsule 0   ondansetron  (ZOFRAN -ODT) 8 MG disintegrating tablet Take 1 tablet (8 mg total) by mouth every 8 (eight) hours as needed for nausea or vomiting. 20 tablet 1   phentermine  (ADIPEX-P ) 37.5 MG tablet Take 1 tablet (37.5 mg total) by mouth daily before breakfast. 30 tablet 0   propranolol  (INDERAL ) 80 MG tablet Take 1 tablet (80 mg total) by mouth in the morning and in the evening for blood  pressure. 180 tablet 1   SYNTHROID  137 MCG tablet Take 1 tablet (137 mcg total) by mouth daily before breakfast. 90 tablet 1   oxyCODONE -acetaminophen  (PERCOCET/ROXICET) 5-325 MG tablet Take 1 tablet by mouth every 6 (six) hours as needed for severe pain (pain score 7-10). 20 tablet 0   No current facility-administered medications for this visit.    Allergies: Allergies  Allergen Reactions   Semaglutide  Shortness Of Breath, Nausea And Vomiting and Rash    Skin peeling   Avelox [Moxifloxacin Hcl In Nacl] Hives     Thrush, throat might have closed    Hydrochlorothiazide Itching    On hands and feet   Other Itching    All Cillins, hives alao   Penicillins     Hives   Prednisone     Chest pains   Zostavax [Zoster Vaccine Live] Other (See Comments)    Redness around injection site/knot size of baseball bat/contracted shingles   Codeine Hives, Itching and Rash   Levothyroxine  Rash    Only to generic. Not to brand synthroid .    Sulfa Antibiotics Itching and Rash    Hands and feet itch and turn red    Past Medical History:  Diagnosis Date   Bilateral carpal tunnel syndrome    Biliary dyskinesia 12/07/2011   Fatty liver    GERD (gastroesophageal reflux disease)    Hepatitis B    High cholesterol    History of kidney stones    Hypertension    Hypothyroidism    Multinodular goiter    PONV (postoperative nausea and vomiting)    Pre-diabetes    Skin cancer    wrist and eyebrow   Past Surgical History:  Procedure Laterality Date   ABDOMINAL HYSTERECTOMY  8 yrs ago   1 ovary spared   CARPAL TUNNEL RELEASE  yrs ago   both wrists   CATARACT EXTRACTION W/PHACO Right 05/04/2021   Procedure: CATARACT EXTRACTION PHACO AND INTRAOCULAR LENS PLACEMENT (IOC);  Surgeon: Harrie Agent, MD;  Location: [facility];  Service: Ophthalmology;  Laterality: Right;  CDE: 1.08   CATARACT EXTRACTION W/PHACO Left 05/18/2021   Procedure: CATARACT EXTRACTION PHACO AND INTRAOCULAR LENS PLACEMENT (IOC);  Surgeon: Harrie Agent, MD;  Location: [facility];  Service: Ophthalmology;  Laterality: Left;  CDE: 2.63   CHOLECYSTECTOMY  12/28/2011   Procedure: LAPAROSCOPIC CHOLECYSTECTOMY WITH INTRAOPERATIVE CHOLANGIOGRAM;  Surgeon: Krystal JINNY Russell, MD;  Location: [facility];  Service: General;  Laterality: N/A;  Laparoscopic cholecystectomy with attempted cholangiogram   CYSTOSCOPY/URETEROSCOPY/HOLMIUM LASER/STENT PLACEMENT Left 04/07/2023   Procedure: CYSTOSCOPY LEFT /URETEROSCOPY/HOLMIUM LASER/STENT PLACEMENT AND RETROGRADE  PYELOGRAM;  Surgeon: Shane Steffan BROCKS, MD;  Location: [facility];  Service: Urology;  Laterality: Left;  60 MINUTE CASE   KNEE SURGERY  arthroscopic, 3 yrs ago   right knee twice   radioactive iodine  to thyroid '  04/2011   TONSILLECTOMY  age 6   TUBAL LIGATION  yrs ago   Family History  Problem Relation Age of Onset   Stroke Mother    Breast cancer Mother 29   Alzheimer's disease Mother    Stroke Father    Heart attack Father    AAA (abdominal aortic aneurysm) Father        rupture, smoker   Alcoholism Sister    Heart attack Brother 79   CAD Brother    Suicidality Maternal Grandmother    Suicidality Maternal Grandfather    Heart attack Paternal Grandmother        26s   Social  History   Socioeconomic History   Marital status: Married    Spouse name: Not on file   Number of children: Not on file   Years of education: Not on file   Highest education level: 12th grade  Occupational History   Not on file  Tobacco Use   Smoking status: Never   Smokeless tobacco: Never  Vaping Use   Vaping status: Never Used  Substance and Sexual Activity   Alcohol use: No   Drug use: No   Sexual activity: Yes    Partners: Male    Birth control/protection: Surgical  Other Topics Concern   Not on file  Social History Narrative   Not on file   Social Drivers of Health   Financial Resource Strain: Low Risk  (06/07/2023)   Overall Financial Resource Strain (CARDIA)    Difficulty of Paying Living Expenses: Not hard at all  Food Insecurity: No Food Insecurity (06/07/2023)   Hunger Vital Sign    Worried About Running Out of Food in the Last Year: Never true    Ran Out of Food in the Last Year: Never true  Transportation Needs: No Transportation Needs (06/07/2023)   PRAPARE - Administrator, Civil Service (Medical): No    Lack of Transportation (Non-Medical): No  Physical Activity: Unknown (06/07/2023)   Exercise Vital Sign    Days of Exercise per Week: 0 days    Minutes of  Exercise per Session: Not on file  Stress: No Stress Concern Present (06/07/2023)   [employer] of Occupational Health - Occupational Stress Questionnaire    Feeling of Stress : Not at all  Social Connections: Moderately Integrated (06/07/2023)   Social Connection and Isolation Panel    Frequency of Communication with Friends and Family: More than three times a week    Frequency of Social Gatherings with Friends and Family: Once a week    Attends Religious Services: More than 4 times per year    Active Member of [employer] or Organizations: No    Attends Engineer, structural: Not on file    Marital Status: Married  Catering manager Violence: Not on file    SUBJECTIVE  Review of Systems Constitutional: Patient denies any unintentional weight loss or change in strength lntegumentary: Patient denies any rashes or pruritus Cardiovascular: Patient denies chest pain or syncope Respiratory: Patient denies shortness of breath Gastrointestinal: As per HPI Musculoskeletal: Patient denies muscle cramps or weakness Neurologic: Patient denies convulsions or seizures Allergic/Immunologic: Patient denies recent allergic reaction(s) Hematologic/Lymphatic: Patient denies bleeding tendencies Endocrine: Patient denies heat/cold intolerance  GU: As per HPI.  OBJECTIVE Vitals:   09/29/23 1009  BP: (!) 78/49  Pulse: 93   There is no height or weight on file to calculate BMI.  Physical Examination Constitutional: No obvious distress; patient is non-toxic appearing  Cardiovascular: No visible lower extremity edema.  Respiratory: The patient does not have audible wheezing/stridor; respirations do not appear labored  Gastrointestinal: Abdomen non-distended Musculoskeletal: Normal ROM of UEs  Skin: No obvious rashes/open sores  Neurologic: CN 2-12 grossly intact Psychiatric: Answered questions appropriately with normal affect  Hematologic/Lymphatic/Immunologic: No obvious bruises or  sites of spontaneous bleeding  Urine microscopy: >30 WBC/hpf, 3-10 RBC/hpf, few bacteria  ASSESSMENT Right ureteral stone - Plan: Urinalysis, Routine w reflex microscopic, Ambulatory Referral For Surgery Scheduling, Urine Culture, ondansetron  (ZOFRAN -ODT) 8 MG disintegrating tablet, nitrofurantoin , macrocrystal-monohydrate, (MACROBID ) 100 MG capsule, fluconazole  (DIFLUCAN ) 150 MG tablet, oxyCODONE -acetaminophen  (PERCOCET/ROXICET) 5-325 MG tablet, DISCONTINUED: nitrofurantoin , macrocrystal-monohydrate, (MACROBID )  100 MG capsule  Hydronephrosis of right kidney - Plan: Ambulatory Referral For Surgery Scheduling, Urine Culture, nitrofurantoin , macrocrystal-monohydrate, (MACROBID ) 100 MG capsule, oxyCODONE -acetaminophen  (PERCOCET/ROXICET) 5-325 MG tablet, DISCONTINUED: ondansetron  (ZOFRAN -ODT) 4 MG disintegrating tablet, DISCONTINUED: nitrofurantoin , macrocrystal-monohydrate, (MACROBID ) 100 MG capsule  Right flank pain - Plan: Ambulatory Referral For Surgery Scheduling, Urine Culture, nitrofurantoin , macrocrystal-monohydrate, (MACROBID ) 100 MG capsule, oxyCODONE -acetaminophen  (PERCOCET/ROXICET) 5-325 MG tablet, DISCONTINUED: nitrofurantoin , macrocrystal-monohydrate, (MACROBID ) 100 MG capsule  Kidney stones - Plan: Ambulatory Referral For Surgery Scheduling, Urine Culture, nitrofurantoin , macrocrystal-monohydrate, (MACROBID ) 100 MG capsule, oxyCODONE -acetaminophen  (PERCOCET/ROXICET) 5-325 MG tablet, DISCONTINUED: nitrofurantoin , macrocrystal-monohydrate, (MACROBID ) 100 MG capsule  Abnormal urinalysis - Plan: Urine Culture, nitrofurantoin , macrocrystal-monohydrate, (MACROBID ) 100 MG capsule, fluconazole  (DIFLUCAN ) 150 MG tablet, DISCONTINUED: nitrofurantoin , macrocrystal-monohydrate, (MACROBID ) 100 MG capsule  Nausea - Plan: ondansetron  (ZOFRAN -ODT) 8 MG disintegrating tablet, DISCONTINUED: ondansetron  (ZOFRAN -ODT) 4 MG disintegrating tablet  Antibiotic-induced yeast infection - Plan: fluconazole   (DIFLUCAN ) 150 MG tablet, DISCONTINUED: fluconazole  (DIFLUCAN ) 150 MG tablet  Leukocytosis, unspecified type - Plan: Urine Culture, nitrofurantoin , macrocrystal-monohydrate, (MACROBID ) 100 MG capsule, fluconazole  (DIFLUCAN ) 150 MG tablet, DISCONTINUED: nitrofurantoin , macrocrystal-monohydrate, (MACROBID ) 100 MG capsule  We reviewed recent imaging results. For acute GU stone symptoms we agreed to proceed with: - For pain management, we discussed the use of OTC analgesics versus opioids as prescribed at ER  - For nausea / vomiting: Zofran  prescription sent   We discussed the various treatment options including  - extracorporeal shock wave lithotripsy (ESWL) - ureteroscopic stone manipulation (URS)  We discussed possible risks and benefits of intervention including but not limited to: including pain, infection, sepsis, UTI, ureter perforation, need for stenting, post-op ureteral stricture, hematuria, possible need for follow up procedures.  She elected to proceed with URS; surgery order placed.   Abnormal UA. Will check urine culture and treat empirically with Macrobid  while awaiting culture results and sensitivities. She reports history of antibiotic-induced yeast vaginitis therefore Diflucan  will also be prescribed.   She was advised to contact urology provider or go to the ER if She develops fever >101F, uncontrollable pain, or other significantly concerning symptoms prior to follow up.  Patient verbalized understanding and agreement. All questions were answered.   PLAN Advised the following: Urine culture. Macrobid  (Nitrofurantoin ) 100 mg 2x/day for 7 days. Analgesics PRN for pain. Zofran  PRN for nausea. Diflucan  PRN if antibiotic-induced yeast vaginitis occurs. Return for surgery.  Orders Placed This Encounter  Procedures   Urine Culture   Urinalysis, Routine w reflex microscopic   Ambulatory Referral For Surgery Scheduling    Referral Priority:   Routine    Referral Type:    Consultation    Number of Visits Requested:   1    It has been explained that the patient is to follow regularly with their PCP in addition to all other providers involved in their care and to follow instructions provided by these respective offices. Patient advised to contact urology clinic if any urologic-pertaining questions, concerns, new symptoms or problems arise in the interim period.  There are no Patient Instructions on file for this visit.  Electronically signed by: Lauraine KYM Oz, MSN, FNP-C, CUNP 09/29/2023 11:04 AM

## 2023-09-29 NOTE — ED Triage Notes (Addendum)
 [facility] from home. Patient called out saying she was dizzy and with her tongue swollen causing her to have difficulty speaking.   Took a robaxin  at 330. Symptoms started around 4pm. Stroke screen negative 93 cbg with ems. Reports chills. Recently seen for kidney stones.  Was pale when ems arrived and had trouble getting O2 sat on her so they placed her on 3l [place] 149/86.  20g r AC 95% on room air upon arrival  Reports tongue feels normal now. But still feels dizzy headed with slight headache

## 2023-09-30 ENCOUNTER — Inpatient Hospital Stay (HOSPITAL_COMMUNITY): Admitting: Anesthesiology

## 2023-09-30 ENCOUNTER — Encounter (HOSPITAL_COMMUNITY)
Admission: EM | Disposition: A | Discharge status: Home / Self Care | Payer: Self-pay | Source: Home / Self Care | Attending: Internal Medicine

## 2023-09-30 ENCOUNTER — Inpatient Hospital Stay (HOSPITAL_COMMUNITY)

## 2023-09-30 DIAGNOSIS — I4510 Unspecified right bundle-branch block: Secondary | ICD-10-CM

## 2023-09-30 DIAGNOSIS — N135 Crossing vessel and stricture of ureter without hydronephrosis: Secondary | ICD-10-CM

## 2023-09-30 DIAGNOSIS — N390 Urinary tract infection, site not specified: Secondary | ICD-10-CM | POA: Diagnosis not present

## 2023-09-30 DIAGNOSIS — E039 Hypothyroidism, unspecified: Secondary | ICD-10-CM

## 2023-09-30 DIAGNOSIS — I10 Essential (primary) hypertension: Secondary | ICD-10-CM

## 2023-09-30 HISTORY — PX: CYSTOSCOPY WITH STENT PLACEMENT: SHX5790

## 2023-09-30 LAB — PROTIME-INR
INR: 1.2 (ref 0.8–1.2)
Prothrombin Time: 15.9 s — ABNORMAL HIGH (ref 11.4–15.2)

## 2023-09-30 LAB — URINALYSIS, W/ REFLEX TO CULTURE (INFECTION SUSPECTED)
Bilirubin Urine: NEGATIVE
Glucose, UA: NEGATIVE mg/dL
Hgb urine dipstick: NEGATIVE
Ketones, ur: 5 mg/dL — AB
Nitrite: NEGATIVE
Protein, ur: 30 mg/dL — AB
Specific Gravity, Urine: 1.013 (ref 1.005–1.030)
pH: 5 (ref 5.0–8.0)

## 2023-09-30 LAB — BASIC METABOLIC PANEL WITH GFR
Anion gap: 9 (ref 5–15)
BUN: 24 mg/dL — ABNORMAL HIGH (ref 8–23)
CO2: 19 mmol/L — ABNORMAL LOW (ref 22–32)
Calcium: 9.7 mg/dL (ref 8.9–10.3)
Chloride: 105 mmol/L (ref 98–111)
Creatinine, Ser: 1.96 mg/dL — ABNORMAL HIGH (ref 0.44–1.00)
GFR, Estimated: 28 mL/min — ABNORMAL LOW (ref >60)
Glucose, Bld: 136 mg/dL — ABNORMAL HIGH (ref 70–99)
Potassium: 5 mmol/L (ref 3.5–5.1)
Sodium: 133 mmol/L — ABNORMAL LOW (ref 135–145)

## 2023-09-30 LAB — GLUCOSE, CAPILLARY
Glucose-Capillary: 106 mg/dL — ABNORMAL HIGH (ref 70–99)
Glucose-Capillary: 120 mg/dL — ABNORMAL HIGH (ref 70–99)
Glucose-Capillary: 130 mg/dL — ABNORMAL HIGH (ref 70–99)
Glucose-Capillary: 133 mg/dL — ABNORMAL HIGH (ref 70–99)
Glucose-Capillary: 221 mg/dL — ABNORMAL HIGH (ref 70–99)
Glucose-Capillary: 268 mg/dL — ABNORMAL HIGH (ref 70–99)

## 2023-09-30 LAB — CBC
HCT: 35.3 % — ABNORMAL LOW (ref 36.0–46.0)
Hemoglobin: 10.7 g/dL — ABNORMAL LOW (ref 12.0–15.0)
MCH: 27 pg (ref 26.0–34.0)
MCHC: 30.3 g/dL (ref 30.0–36.0)
MCV: 88.9 fL (ref 80.0–100.0)
Platelets: 382 K/uL (ref 150–400)
RBC: 3.97 MIL/uL (ref 3.87–5.11)
RDW: 13.6 % (ref 11.5–15.5)
WBC: 29 K/uL — ABNORMAL HIGH (ref 4.0–10.5)
nRBC: 0 % (ref 0.0–0.2)

## 2023-09-30 LAB — HIV ANTIBODY (ROUTINE TESTING W REFLEX): HIV Screen 4th Generation wRfx: NONREACTIVE

## 2023-09-30 LAB — APTT: aPTT: 34 s (ref 24–36)

## 2023-09-30 LAB — LACTIC ACID, PLASMA: Lactic Acid, Venous: 1.5 mmol/L (ref 0.5–1.9)

## 2023-09-30 SURGERY — CYSTOSCOPY, WITH STENT INSERTION
Anesthesia: General | Laterality: Right

## 2023-09-30 MED ORDER — HYDROMORPHONE HCL 1 MG/ML IJ SOLN: 0.2500 mg | INTRAMUSCULAR | Status: DC | PRN

## 2023-09-30 MED ORDER — CHLORHEXIDINE GLUCONATE CLOTH 2 % EX PADS
6.0000 | MEDICATED_PAD | Freq: Every day | CUTANEOUS | Status: DC
  Administered 2023-09-30 – 2023-10-01 (×2): 6 via TOPICAL

## 2023-09-30 MED ORDER — PHENYLEPHRINE 80 MCG/ML (10ML) SYRINGE FOR IV PUSH (FOR BLOOD PRESSURE SUPPORT)
PREFILLED_SYRINGE | INTRAVENOUS | Status: DC | PRN
  Administered 2023-09-30 (×3): 160 ug via INTRAVENOUS

## 2023-09-30 MED ORDER — SODIUM CHLORIDE 0.9 % IV SOLN: INTRAVENOUS | Status: DC

## 2023-09-30 MED ORDER — ONDANSETRON HCL 4 MG/2ML IJ SOLN
INTRAMUSCULAR | Status: AC
  Filled 2023-09-30: qty 2

## 2023-09-30 MED ORDER — LIDOCAINE HCL (PF) 2 % IJ SOLN
INTRAMUSCULAR | Status: AC
  Filled 2023-09-30: qty 5

## 2023-09-30 MED ORDER — ONDANSETRON HCL 4 MG/2ML IJ SOLN: 4.0000 mg | Freq: Once | INTRAMUSCULAR | Status: DC | PRN

## 2023-09-30 MED ORDER — PROPOFOL 10 MG/ML IV BOLUS
INTRAVENOUS | Status: AC
  Filled 2023-09-30: qty 20

## 2023-09-30 MED ORDER — IOHEXOL 300 MG/ML  SOLN
INTRAMUSCULAR | Status: DC | PRN
  Administered 2023-09-30: 8 mL

## 2023-09-30 MED ORDER — DEXAMETHASONE SODIUM PHOSPHATE 10 MG/ML IJ SOLN
INTRAMUSCULAR | Status: AC
  Filled 2023-09-30: qty 1

## 2023-09-30 MED ORDER — DEXAMETHASONE SODIUM PHOSPHATE 10 MG/ML IJ SOLN
INTRAMUSCULAR | Status: AC
Start: 2023-09-30 — End: 2023-09-30
  Filled 2023-09-30: qty 1

## 2023-09-30 MED ORDER — SODIUM CHLORIDE 0.9 % IR SOLN
Status: DC | PRN
  Administered 2023-09-30: 3000 mL

## 2023-09-30 MED ORDER — FENTANYL CITRATE (PF) 100 MCG/2ML IJ SOLN
INTRAMUSCULAR | Status: AC
  Filled 2023-09-30: qty 2

## 2023-09-30 MED ORDER — PHENYLEPHRINE 80 MCG/ML (10ML) SYRINGE FOR IV PUSH (FOR BLOOD PRESSURE SUPPORT)
PREFILLED_SYRINGE | INTRAVENOUS | Status: AC
  Filled 2023-09-30: qty 10

## 2023-09-30 MED ORDER — MIDAZOLAM HCL 2 MG/2ML IJ SOLN
INTRAMUSCULAR | Status: AC
  Filled 2023-09-30: qty 2

## 2023-09-30 MED ORDER — PROPOFOL 10 MG/ML IV BOLUS
INTRAVENOUS | Status: DC | PRN
  Administered 2023-09-30: 180 mg via INTRAVENOUS

## 2023-09-30 MED ORDER — CARMEX CLASSIC LIP BALM EX OINT
TOPICAL_OINTMENT | CUTANEOUS | Status: DC | PRN
  Filled 2023-09-30: qty 10

## 2023-09-30 MED ORDER — LIDOCAINE HCL (CARDIAC) PF 100 MG/5ML IV SOSY
PREFILLED_SYRINGE | INTRAVENOUS | Status: DC | PRN
  Administered 2023-09-30: 80 mg via INTRATRACHEAL

## 2023-09-30 MED ORDER — DEXAMETHASONE SODIUM PHOSPHATE 10 MG/ML IJ SOLN
INTRAMUSCULAR | Status: DC | PRN
  Administered 2023-09-30: 10 mg via INTRAVENOUS

## 2023-09-30 MED ORDER — OXYCODONE HCL 5 MG/5ML PO SOLN: 5.0000 mg | Freq: Once | ORAL | Status: DC | PRN

## 2023-09-30 MED ORDER — MIDAZOLAM HCL 2 MG/2ML IJ SOLN
INTRAMUSCULAR | Status: DC | PRN
  Administered 2023-09-30: 2 mg via INTRAVENOUS

## 2023-09-30 MED ORDER — OXYCODONE HCL 5 MG PO TABS: 5.0000 mg | ORAL_TABLET | Freq: Once | ORAL | Status: DC | PRN

## 2023-09-30 MED ORDER — FENTANYL CITRATE (PF) 100 MCG/2ML IJ SOLN
INTRAMUSCULAR | Status: DC | PRN
  Administered 2023-09-30: 50 ug via INTRAVENOUS

## 2023-09-30 MED ORDER — ACETAMINOPHEN 10 MG/ML IV SOLN: 1000.0000 mg | Freq: Once | INTRAVENOUS | Status: DC | PRN

## 2023-09-30 MED ORDER — ONDANSETRON HCL 4 MG/2ML IJ SOLN
INTRAMUSCULAR | Status: DC | PRN
  Administered 2023-09-30: 4 mg via INTRAVENOUS

## 2023-09-30 MED ORDER — FLUTICASONE PROPIONATE 50 MCG/ACT NA SUSP
2.0000 | Freq: Every day | NASAL | Status: DC
  Administered 2023-09-30 – 2023-10-02 (×3): 2 via NASAL
  Filled 2023-09-30: qty 16

## 2023-09-30 MED FILL — Oxycodone w/ Acetaminophen Tab 5-325 MG: ORAL | Qty: 6 | Status: AC

## 2023-09-30 SURGICAL SUPPLY — 12 items
BAG URO CATCHER STRL LF (MISCELLANEOUS) ×1 IMPLANT
CATH URETL OPEN 5X70 (CATHETERS) ×1 IMPLANT
CLOTH BEACON ORANGE TIMEOUT ST (SAFETY) ×1 IMPLANT
GLOVE SURG LX STRL 8.0 MICRO (GLOVE) ×1 IMPLANT
GOWN STRL SURGICAL XL XLNG (GOWN DISPOSABLE) ×1 IMPLANT
GUIDEWIRE ZIPWRE .038 STRAIGHT (WIRE) ×1 IMPLANT
KIT TURNOVER KIT A (KITS) ×1 IMPLANT
MANIFOLD NEPTUNE II (INSTRUMENTS) ×1 IMPLANT
PACK CYSTO (CUSTOM PROCEDURE TRAY) ×1 IMPLANT
STENT URET 6FRX24 CONTOUR (STENTS) IMPLANT
TRAY FOLEY MTR SLVR 16FR STAT (SET/KITS/TRAYS/PACK) IMPLANT
TUBING CONNECTING 10 (TUBING) ×1 IMPLANT

## 2023-09-30 NOTE — Op Note (Signed)
 Operative Note  Preoperative diagnosis:  1.  Obstructing 17 mm right UPJ stone  Postoperative diagnosis: 1.  Obstructing 17 mm right UPJ stone  Procedure(s): 1.  Cystoscopy with right ureteral stent placement 2.  Right retrograde pyelogram with intraoperative interpretation of fluoroscopic imaging  Surgeon: Lonni Han, MD  Assistants:  None  Anesthesia:  General  Complications:  None  EBL: Less than 5 mL  Specimens: 1.  Urine specimen was sent for culture and sensitivity  Drains/Catheters: 1.  Right 6 French, 24 cm JJ stent without tether 2.  16 French Foley catheter  Intraoperative findings:   No intravesical or urethral abnormalities were seen Solitary right collecting system with no filling defects or dilation involving the right ureter or right renal pelvis seen on retrograde pyelogram Purulent urine was seen draining from the right upper urinary tract following stent placement  Indication:  Norma Sanchez is a 63 y.o. female with an obstructing 17 mm right UPJ stone with concomitant UTI.  She has been consented for the above procedures, voices understanding and wishes to proceed.  Description of procedure:  After informed consent was obtained, the patient was brought to the operating room and general LMA anesthesia was administered. The patient was then placed in the dorsolithotomy position and prepped and draped in the usual sterile fashion. A timeout was performed. A 21 French rigid cystoscope was then inserted into the urethral meatus and advanced into the bladder under direct vision. A complete bladder survey revealed no intravesical pathology.  A 5 French ureteral catheter was then inserted into the right ureteral orifice and a retrograde pyelogram was obtained, with the findings listed above.  A Glidewire was then used to intubate the lumen of the ureteral catheter and was advanced up to the right renal pelvis, under fluoroscopic guidance.  The catheter was  then removed, leaving the wire in place.  A 6 French, 24 cm JJ stent was then advanced over the wire and into good position within the right collecting system, confirming placement via fluoroscopy.  Following stent placement, purulent urine was seen draining from the right upper urinary tract.  A urine specimen was collected and sent for culture.  The rigid cystoscope was then removed.  A 16 French Foley catheter was then inserted into the bladder with return of purulent urine.  The Foley catheter was then placed to gravity drainage.  She tolerated the procedure well and was transferred to the postanesthesia in stable condition.  Plan: Continue to monitor on the floor.  Okay to remove Foley catheter if she remains afebrile for 24 hours post stent placement.  Will arrange outpatient follow-up with Dr. Sherrilee for definitive stone treatment in the coming weeks.  Continue empiric antibiotic coverage with Rocephin .

## 2023-09-30 NOTE — OR Nursing (Signed)
 Spoke w/ patient's 4W nurse Ethan, RN and informed him Ms. Pendergraph has just been scheduled for surgery to start at 0630, so she needs to come to the PACU asap.  He stated he would get her ready and bring her to PACU now.

## 2023-09-30 NOTE — Anesthesia Preprocedure Evaluation (Addendum)
 Anesthesia Evaluation  Patient identified by MRN, date of birth, ID band Patient awake  General Assessment Comment:Oral trauma from airway device  Reviewed: Allergy & Precautions, NPO status , Patient's Chart, lab work & pertinent test results, reviewed documented beta blocker date and time   History of Anesthesia Complications (+) PONV and history of anesthetic complications  Airway Mallampati: III  TM Distance: >3 FB     Dental no notable dental hx. (+) Teeth Intact, Dental Advisory Given   Pulmonary neg pulmonary ROS   Pulmonary exam normal breath sounds clear to auscultation       Cardiovascular hypertension, Pt. on medications Normal cardiovascular exam+ dysrhythmias  Rhythm:Regular Rate:Normal     Neuro/Psych negative neurological ROS  negative psych ROS   GI/Hepatic ,GERD  ,,(+) Hepatitis -, B  Endo/Other  Hypothyroidism  Class 3 obesityHLD  Renal/GU Renal InsufficiencyRenal diseaseRight Nephrolithiasis/hydronephrosis  Lab Results      Component                Value               Date                      NA                       133 (L)             09/30/2023                CL                       105                 09/30/2023                K                        5.0                 09/30/2023                CO2                      19 (L)              09/30/2023                BUN                      24 (H)              09/30/2023                CREATININE               1.96 (H)            09/30/2023                    GLUCOSE                  136 (H)             09/30/2023             negative genitourinary   Musculoskeletal  (+) Arthritis ,    Abdominal  (+) + obese  Peds  Hematology  (+)  Blood dyscrasia, anemia Lab Results      Component                Value               Date                      WBC                      29.0 (H)            09/30/2023                HGB                       10.7 (L)            09/30/2023                HCT                      35.3 (L)            09/30/2023                MCV                      88.9                09/30/2023                PLT                      382                 09/30/2023           Heparin  this am   Anesthesia Other Findings All: See list  Reproductive/Obstetrics                              Anesthesia Physical Anesthesia Plan  ASA: 3 and emergent  Anesthesia Plan: General   Post-op Pain Management: Tylenol  PO (pre-op)*   Induction: Intravenous  PONV Risk Score and Plan: 4 or greater and Treatment may vary due to age or medical condition, Ondansetron , Midazolam , Dexamethasone  and Scopolamine patch - Pre-op  Airway Management Planned: LMA  Additional Equipment: None  Intra-op Plan:   Post-operative Plan: Extubation in OR  Informed Consent: I have reviewed the patients History and Physical, chart, labs and discussed the procedure including the risks, benefits and alternatives for the proposed anesthesia with the patient or authorized representative who has indicated his/her understanding and acceptance.     Dental advisory given  Plan Discussed with: Anesthesiologist and CRNA  Anesthesia Plan Comments:          Anesthesia Quick Evaluation

## 2023-09-30 NOTE — Anesthesia Procedure Notes (Signed)
 Procedure Name: LMA Insertion Date/Time: 09/30/2023 7:00 AM  Performed by: Emilio Rock DEL, CRNAPre-anesthesia Checklist: Patient identified, Emergency Drugs available, Suction available, Patient being monitored and Timeout performed Oxygen Delivery Method: Circle system utilized Preoxygenation: Pre-oxygenation with 100% oxygen Induction Type: IV induction Ventilation: Mask ventilation without difficulty LMA: LMA inserted LMA Size: 4.0 Number of attempts: 1

## 2023-09-30 NOTE — Progress Notes (Addendum)
 PROGRESS NOTE  Norma Sanchez  FMW:[id number] DOB: April 13, 1960 DOA: 09/29/2023 PCP: Melvenia Manus BRAVO, MD   Brief Narrative: Patient is a 63 year old female with history of nephrolithiasis, hypertension, hyperlipidemia, type 2 diabetes, obesity, hypothyroidism who presented with tongue swelling, dizziness, right abdominal pain.  Was having intermittent right abdominal/flank pain for more than a week.  Found to have 17 mm obstructing right UPJ stone with associated hydronephrosis, significant renal edema, perinephric stranding.  UA showed plenty of RBCs, WBCs.  Lab work showed leukocytosis, creatinine of 1.3, hyperkalemia.  On presentation, she was also found to have low-grade fever.  Urology consulted and she was transferred to [facility].  Started on ceftriaxone .  Underwent cystoscopy with right ureteral stent placement today.  Cultures pending.  Assessment & Plan:  Principal Problem:   Complicated UTI (urinary tract infection) Active Problems:   Morbid obesity with BMI of 40.0-44.9, adult (HCC)   Hypertension   Hypothyroidism   Hyperlipidemia   Kidney stones   Obstructive right ureterolithiasis: Presented with intermittent right-sided abdominal/flank pain.  Lab work showed leukocytosis, mild AKI.  UA was suspicious for UTI.  Also had low-grade fever.  Started on ceftriaxone .  Her previous urine culture showed E. coli, Klebsiella.  Urology consulted and she underwent right ureteral stent placement today.  UTI: Follow-up urine culture.  Continue ceftriaxone .  Follow-up blood cultures.  Has severe leukocytosis.  Afebrile this morning  AKI  with hyperkalemia: Creatinine was 1.3 about 6 months ago.  Continue IV fluid, monitor BMP  diabetes type 2: Takes metformin  at home.  Currently on sliding scale.  Monitor blood sugars  Hypertension: Takes amiloride , candesartan , propranolol  at home.  Currently blood pressure stable  Hyperlipidemia: On Lipitor  Depression: On Lexapro ,  bupropion   Hypothyroidism: On Synthyroid  Morbid obesity: Takes phentermine . BMI of  42.5            DVT prophylaxis:heparin  injection 5,000 Units Start: 09/30/23 0600     Code Status: Full Code  Family Communication: None at the bedside  Patient status: Inpatient  Patient is from : Home  Anticipated discharge to: Home  Estimated DC date: 1 to 2 days   Consultants: Urology  Procedures: Stent placement  Antimicrobials:  Anti-infectives (From admission, onward)    Start     Dose/Rate Route Frequency Ordered Stop   09/30/23 2030  cefTRIAXone  (ROCEPHIN ) 2 g in sodium chloride  0.9 % 100 mL IVPB        2 g 200 mL/hr over 30 Minutes Intravenous Every 24 hours 09/29/23 2217     09/29/23 2030  cefTRIAXone  (ROCEPHIN ) 2 g in sodium chloride  0.9 % 100 mL IVPB        2 g 200 mL/hr over 30 Minutes Intravenous  Once 09/29/23 2022 09/29/23 2127       Subjective: Patient seen and examined at bedside today.  Hemodynamically stable.  Just came from the OR.  Feels comfortable.  No abdomen pain, nausea or vomiting.  Afebrile this morning  Objective: Vitals:   09/30/23 0724 09/30/23 0730 09/30/23 0745 09/30/23 0800  BP: 100/62 108/66 105/66 118/66  Pulse: 84 81 79 79  Resp: (!) 21 19 (!) 21 19  Temp: 98.4 F (36.9 C)   98.4 F (36.9 C)  TempSrc:      SpO2: 96% 91% 98% 99%  Weight:      Height:        Intake/Output Summary (Last 24 hours) at 09/30/2023 0826 Last data filed at 09/30/2023 0800 Gross per  24 hour  Intake 2011.68 ml  Output 310 ml  Net 1701.68 ml   Filed Weights   09/29/23 1949  Weight: 98.9 kg    Examination:  General exam: Overall comfortable, not in distress, morbidly obese HEENT: PERRL Respiratory system:  no wheezes or crackles  Cardiovascular system: S1 & S2 heard, RRR.  Gastrointestinal system: Abdomen is nondistended, soft and nontender. Central nervous system: Alert and oriented Extremities: No edema, no clubbing ,no cyanosis Skin: No  rashes, no ulcers,no icterus     Data Reviewed: I have personally reviewed following labs and imaging studies  CBC: Recent Labs  Lab 09/23/23 1428 09/28/23 2022 09/29/23 2046 09/30/23 0334  WBC 14.1* 20.0* 16.9* 29.0*  NEUTROABS 8.7* 16.9* 15.3*  --   HGB 13.6 13.4 12.0 10.7*  HCT 41.7 41.4 38.2 35.3*  MCV 87 87.5 89.9 88.9  PLT 440 483* 395 382   Basic Metabolic Panel: Recent Labs  Lab 09/23/23 1428 09/28/23 2022 09/29/23 2044 09/30/23 0334  NA 133* 134* 133* 133*  K 5.4* 5.4* 5.3* 5.0  CL 97 104 102 105  CO2 18* 20* 20* 19*  GLUCOSE 96 147* 92 136*  BUN 28* 25* 27* 24*  CREATININE 1.66* 1.31* 2.04* 1.96*  CALCIUM  11.3* 10.7* 10.0 9.7  MG  --  1.6*  --   --      Recent Results (from the past 240 hours)  Microscopic Examination     Status: Abnormal   Collection Time: 09/29/23 10:00 AM   Urine  Result Value Ref Range Status   WBC, UA >30 (A) 0 - 5 /hpf Final   RBC, Urine 3-10 (A) 0 - 2 /hpf Final   Epithelial Cells (non renal) 0-10 0 - 10 /hpf Final   Bacteria, UA Few (A) None seen/Few Final  Blood culture (routine x 2)     Status: None (Preliminary result)   Collection Time: 09/29/23  8:46 PM   Specimen: BLOOD  Result Value Ref Range Status   Specimen Description BLOOD BLOOD LEFT ARM  Final   Special Requests   Final    BOTTLES DRAWN AEROBIC AND ANAEROBIC Blood Culture adequate volume   Culture   Final    NO GROWTH < 12 HOURS Performed at [facility], [address] [place], [place] [address]    Report Status PENDING  Incomplete  Blood culture (routine x 2)     Status: None (Preliminary result)   Collection Time: 09/29/23  8:59 PM   Specimen: BLOOD LEFT HAND  Result Value Ref Range Status   Specimen Description BLOOD LEFT HAND  Final   Special Requests   Final    AEROBIC BOTTLE ONLY Blood Culture results may not be optimal due to an inadequate volume of blood received in culture bottles   Culture   Final    NO GROWTH < 12 HOURS Performed at [facility], [address] [place], [place] [address]    Report Status PENDING  Incomplete     Radiology Studies: DG C-Arm 1-60 Min-No Report Result Date: 09/30/2023 Fluoroscopy was utilized by the requesting physician.  No radiographic interpretation.   CT RENAL STONE STUDY Result Date: 09/28/2023 CLINICAL DATA:  Nausea, known right-sided kidney stone, abdominal pain EXAM: CT ABDOMEN AND PELVIS WITHOUT CONTRAST TECHNIQUE: Multidetector CT imaging of the abdomen and pelvis was performed following the standard protocol without IV contrast. RADIATION DOSE REDUCTION: This exam was performed according to the departmental dose-optimization program which includes automated exposure control, adjustment of  the mA and/or kV according to patient size and/or use of iterative reconstruction technique. COMPARISON:  04/03/2023 FINDINGS: Lower chest: No acute pleural or parenchymal lung disease. Hepatobiliary: Cholecystectomy. Unremarkable unenhanced appearance of the liver. No biliary duct dilation. Pancreas: Unremarkable unenhanced appearance. Spleen: Unremarkable unenhanced appearance. Adrenals/Urinary Tract: There is an obstructing 17 mm right UPJ calculus, reference image 42/2. Moderate right-sided hydronephrosis, with significant right renal edema and perinephric fat stranding also noted. The left kidney is unremarkable. The adrenals and bladder are normal. Stomach/Bowel: No bowel obstruction or ileus. Normal appendix right mid abdomen. Scattered distal colonic diverticulosis without diverticulitis. No bowel wall thickening or inflammatory change. Vascular/Lymphatic: Aortic atherosclerosis. No enlarged abdominal or pelvic lymph nodes. Reproductive: Status post hysterectomy. No adnexal masses. Other: No free fluid or free intraperitoneal gas. No abdominal wall hernia. Musculoskeletal: No acute or destructive bony abnormalities. Reconstructed images demonstrate no additional findings. IMPRESSION: 1. Obstructing 17 mm right  UPJ calculus, with moderate right hydronephrosis and significant right perinephric fat stranding. 2.  Aortic Atherosclerosis (ICD10-I70.0). Electronically Signed   By: Ozell Daring M.D.   On: 09/28/2023 21:11    Scheduled Meds:  aspirin  EC  81 mg Oral Daily   atorvastatin   10 mg Oral Daily   buPROPion   150 mg Oral q morning   escitalopram   20 mg Oral QPM   heparin   5,000 Units Subcutaneous Q8H   insulin  aspart  0-9 Units Subcutaneous Q4H   levothyroxine   137 mcg Oral Q0600   propranolol   80 mg Oral BID   sodium chloride  flush  3 mL Intravenous Q12H   Continuous Infusions:  cefTRIAXone  (ROCEPHIN )  IV       LOS: 1 day   Ivonne Mustache, MD [facility] P8/09/2023, 8:26 AM

## 2023-09-30 NOTE — Transfer of Care (Signed)
 Immediate Anesthesia Transfer of Care Note  Patient: Norma Sanchez  Procedure(s) Performed: CYSTOSCOPY, WITH STENT INSERTION (Right)  Patient Location: PACU  Anesthesia Type:General  Level of Consciousness: awake, alert , and patient cooperative  Airway & Oxygen Therapy: Patient Spontanous Breathing and Patient connected to face mask oxygen  Post-op Assessment: Report given to RN and Post -op Vital signs reviewed and stable  Post vital signs: stable  Last Vitals:  Vitals Value Taken Time  BP 100/62 09/30/23 07:24  Temp    Pulse 86 09/30/23 07:28  Resp 19 09/30/23 07:28  SpO2 93 % 09/30/23 07:28  Vitals shown include unfiled device data.  Last Pain:  Vitals:   09/30/23 0625  TempSrc:   PainSc: 0-No pain         Complications: No notable events documented.

## 2023-09-30 NOTE — Progress Notes (Signed)
   09/30/23 1606  TOC Brief Assessment  Insurance and Status Reviewed  Patient has primary care physician Yes  Home environment has been reviewed home with spouse  Prior level of function: independent  Prior/Current Home Services No current home services  Social Drivers of Health Review SDOH reviewed no interventions necessary  Readmission risk has been reviewed Yes  Transition of care needs no transition of care needs at this time

## 2023-09-30 NOTE — Anesthesia Postprocedure Evaluation (Addendum)
 Anesthesia Post Note  Patient: Norma Sanchez  Procedure(s) Performed: CYSTOSCOPY, WITH STENT INSERTION (Right)     Patient location during evaluation: PACU Anesthesia Type: General Level of consciousness: awake and alert and oriented Pain management: pain level controlled Vital Signs Assessment: post-procedure vital signs reviewed and stable Respiratory status: spontaneous breathing, nonlabored ventilation and respiratory function stable Cardiovascular status: blood pressure returned to baseline and stable Postop Assessment: no apparent nausea or vomiting Anesthetic complications: no   No notable events documented.  Last Vitals:  Vitals:   09/30/23 0800 09/30/23 0811  BP: 118/66 93/68  Pulse: 79 85  Resp: 19 (!) 22  Temp: 36.9 C   SpO2: 99% 97%    Last Pain:  Vitals:   09/30/23 0811  TempSrc:   PainSc: 0-No pain                 Cosette Prindle A.

## 2023-09-30 NOTE — Consult Note (Signed)
 Urology Consult   Physician requesting consult: Redell Pinal, MD  Reason for consult: Right ureteral stone with UTI  History of Present Illness: Norma Sanchez is a 63 y.o. female with a 1 week history of right-sided flank pain.  She was initially seen at the [facility] emergency department on 09/28/2023 due to worsening flank pain and subjective fevers where she was found to have a 17 mm right UPJ stone.  She presented back to the ER yesterday with worsening pain and a fever of 100.3 F, AKI and a leukocytosis.  She has a history of kidney stones and required left ureteroscopy in February with Dr. Shane.  She is now established with Dr. Little practice at [facility] and would like to follow-up with them.  She is currently resting comfortably in bed and reports mild right-sided flank pain without nausea/vomiting.  She has remained afebrile overnight, but is slightly tachypneic and tachycardic.   Past Medical History:  Diagnosis Date   Bilateral carpal tunnel syndrome    Biliary dyskinesia 12/07/2011   Fatty liver    GERD (gastroesophageal reflux disease)    Hepatitis B    High cholesterol    History of kidney stones    Hypertension    Hypothyroidism    Multinodular goiter    PONV (postoperative nausea and vomiting)    Pre-diabetes    Skin cancer    wrist and eyebrow    Past Surgical History:  Procedure Laterality Date   ABDOMINAL HYSTERECTOMY  8 yrs ago   1 ovary spared   CARPAL TUNNEL RELEASE  yrs ago   both wrists   CATARACT EXTRACTION W/PHACO Right 05/04/2021   Procedure: CATARACT EXTRACTION PHACO AND INTRAOCULAR LENS PLACEMENT (IOC);  Surgeon: Harrie Agent, MD;  Location: [facility];  Service: Ophthalmology;  Laterality: Right;  CDE: 1.08   CATARACT EXTRACTION W/PHACO Left 05/18/2021   Procedure: CATARACT EXTRACTION PHACO AND INTRAOCULAR LENS PLACEMENT (IOC);  Surgeon: Harrie Agent, MD;  Location: [facility];  Service: Ophthalmology;  Laterality: Left;  CDE: 2.63    CHOLECYSTECTOMY  12/28/2011   Procedure: LAPAROSCOPIC CHOLECYSTECTOMY WITH INTRAOPERATIVE CHOLANGIOGRAM;  Surgeon: Krystal JINNY Russell, MD;  Location: [facility];  Service: General;  Laterality: N/A;  Laparoscopic cholecystectomy with attempted cholangiogram   CYSTOSCOPY/URETEROSCOPY/HOLMIUM LASER/STENT PLACEMENT Left 04/07/2023   Procedure: CYSTOSCOPY LEFT /URETEROSCOPY/HOLMIUM LASER/STENT PLACEMENT AND RETROGRADE PYELOGRAM;  Surgeon: Shane Steffan BROCKS, MD;  Location: [facility];  Service: Urology;  Laterality: Left;  60 MINUTE CASE   KNEE SURGERY  arthroscopic, 3 yrs ago   right knee twice   radioactive iodine  to thyroid '  04/2011   TONSILLECTOMY  age 89   TUBAL LIGATION  yrs ago    Current Hospital Medications:  Home Meds:  Current Meds  Medication Sig   aMILoride  (MIDAMOR ) 5 MG tablet Take 1 tablet (5 mg total) by mouth daily.   Ascorbic Acid (VITAMIN C PO) Take 1 tablet by mouth in the morning.   aspirin  EC 81 MG tablet Take 81 mg by mouth in the morning. Swallow whole.   aspirin -acetaminophen -caffeine (EXCEDRIN MIGRAINE) 250-250-65 MG tablet Take 2 tablets by mouth every 6 (six) hours as needed for headache or migraine (pain).   atorvastatin  (LIPITOR) 10 MG tablet Take 1 tablet (10 mg total) by mouth daily for cholesterol   buPROPion  (WELLBUTRIN  XL) 150 MG 24 hr tablet Take 1 tablet (150 mg total) by mouth every morning.   candesartan  (ATACAND ) 32 MG tablet Take 1 tablet (32 mg total) by mouth nightly  for blood pressure   cetirizine (ZYRTEC) 10 MG tablet Take 10 mg by mouth in the morning.   escitalopram  (LEXAPRO ) 20 MG tablet Take 1 tablet (20 mg total) by mouth every evening.   esomeprazole  (NEXIUM ) 40 MG capsule Take 1 capsule (40 mg total) by mouth daily to prevent heartburn & indigestion   fluconazole  (DIFLUCAN ) 150 MG tablet Take once. May repeat in 3 days if symptoms fail to fully resolve after first dose. (Patient taking differently: Take 150 mg by mouth 3 days. Take once. May repeat  in 3 days if symptoms fail to fully resolve after first dose.)   fluticasone  (FLONASE ) 50 MCG/ACT nasal spray Place 1 spray into both nostrils daily. (Patient taking differently: Place 1 spray into both nostrils at bedtime.)   meclizine  (ANTIVERT ) 12.5 MG tablet Take 1 tablet (12.5 mg total) by mouth 3 (three) times daily as needed for dizziness.   metFORMIN  (GLUCOPHAGE -XR) 500 MG 24 hr tablet Take 2 tablets (1,000 mg total) by mouth 2 (two) times daily.   nitrofurantoin , macrocrystal-monohydrate, (MACROBID ) 100 MG capsule Take 1 capsule (100 mg total) by mouth 2 (two) times daily for 7 days.   ondansetron  (ZOFRAN -ODT) 8 MG disintegrating tablet Take 1 tablet (8 mg total) by mouth every 8 (eight) hours as needed for nausea or vomiting.   phentermine  (ADIPEX-P ) 37.5 MG tablet Take 1 tablet (37.5 mg total) by mouth daily before breakfast.   propranolol  (INDERAL ) 80 MG tablet Take 1 tablet (80 mg total) by mouth in the morning and in the evening for blood pressure.   SYNTHROID  137 MCG tablet Take 1 tablet (137 mcg total) by mouth daily before breakfast.   [DISCONTINUED] oxyCODONE -acetaminophen  (PERCOCET/ROXICET) 5-325 MG tablet Take 1 tablet by mouth every 6 (six) hours as needed for severe pain (pain score 7-10).    Scheduled Meds:  aspirin  EC  81 mg Oral Daily   atorvastatin   10 mg Oral Daily   buPROPion   150 mg Oral q morning   escitalopram   20 mg Oral QPM   heparin   5,000 Units Subcutaneous Q8H   insulin  aspart  0-9 Units Subcutaneous Q4H   levothyroxine   137 mcg Oral Q0600   propranolol   80 mg Oral BID   sodium chloride  flush  3 mL Intravenous Q12H   Continuous Infusions:  sodium chloride  100 mL/hr at 09/29/23 2325   cefTRIAXone  (ROCEPHIN )  IV     PRN Meds:.acetaminophen  **OR** acetaminophen , HYDROcodone -acetaminophen , meclizine , ondansetron   Allergies:  Allergies  Allergen Reactions   Avelox [Moxifloxacin Hcl In Nacl] Anaphylaxis and Hives    Thrush, throat might have closed     Robaxin  [Methocarbamol ] Swelling and Other (See Comments)    Tongue swelling, stroke-like symptoms   Semaglutide  Shortness Of Breath, Nausea And Vomiting and Rash    Skin peeling   Prednisone Other (See Comments)    Chest pains   Codeine Hives, Itching and Rash   Hydrochlorothiazide Itching    On hands and feet   Levothyroxine  Rash    Only to generic. Not to brand synthroid .    Other Hives and Itching    All Cillins   Penicillins Hives   Sulfa Antibiotics Itching and Rash    Hands and feet itch and turn red   Zostavax [Zoster Vaccine Live] Other (See Comments)    Redness around injection site/knot size of baseball bat/contracted shingles    Family History  Problem Relation Age of Onset   Stroke Mother    Breast cancer Mother 65  Alzheimer's disease Mother    Stroke Father    Heart attack Father    AAA (abdominal aortic aneurysm) Father        rupture, smoker   Alcoholism Sister    Heart attack Brother 33   CAD Brother    Suicidality Maternal Grandmother    Suicidality Maternal Grandfather    Heart attack Paternal Grandmother        30s    Social History:  reports that she has never smoked. She has never used smokeless tobacco. She reports that she does not drink alcohol and does not use drugs.  ROS: A complete review of systems was performed.  All systems are negative except for pertinent findings as noted.  Physical Exam:  Vital signs in last 24 hours: Temp:  [98.2 F (36.8 C)-100.3 F (37.9 C)] 99.3 F (37.4 C) (08/08 0241) Pulse Rate:  [90-97] 90 (08/08 0241) Resp:  [17-26] 20 (08/08 0241) BP: (78-146)/(49-116) 129/116 (08/08 0241) SpO2:  [95 %-96 %] 96 % (08/08 0241) Weight:  [98.9 kg] 98.9 kg (08/07 1949) Constitutional:  Alert and oriented, No acute distress Cardiovascular: Regular rate and rhythm, No JVD Respiratory: Normal respiratory effort, Lungs clear bilaterally GI: Abdomen is soft, nontender, nondistended, no abdominal masses GU: No CVA  tenderness Lymphatic: No lymphadenopathy Neurologic: Grossly intact, no focal deficits Psychiatric: Normal mood and affect  Laboratory Data:  Recent Labs    09/28/23 2022 09/29/23 2046 09/30/23 0334  WBC 20.0* 16.9* 29.0*  HGB 13.4 12.0 10.7*  HCT 41.4 38.2 35.3*  PLT 483* 395 382    Recent Labs    09/28/23 2022 09/29/23 2044 09/30/23 0334  NA 134* 133* 133*  K 5.4* 5.3* 5.0  CL 104 102 105  GLUCOSE 147* 92 136*  BUN 25* 27* 24*  CALCIUM  10.7* 10.0 9.7  CREATININE 1.31* 2.04* 1.96*     Results for orders placed or performed during the hospital encounter of 09/29/23 (from the past 24 hours)  Basic metabolic panel with GFR     Status: Abnormal   Collection Time: 09/29/23  8:44 PM  Result Value Ref Range   Sodium 133 (L) 135 - 145 mmol/L   Potassium 5.3 (H) 3.5 - 5.1 mmol/L   Chloride 102 98 - 111 mmol/L   CO2 20 (L) 22 - 32 mmol/L   Glucose, Bld 92 70 - 99 mg/dL   BUN 27 (H) 8 - 23 mg/dL   Creatinine, Ser 7.95 (H) 0.44 - 1.00 mg/dL   Calcium  10.0 8.9 - 10.3 mg/dL   GFR, Estimated 27 (L) >60 mL/min   Anion gap 11 5 - 15  CBC with Differential     Status: Abnormal   Collection Time: 09/29/23  8:46 PM  Result Value Ref Range   WBC 16.9 (H) 4.0 - 10.5 K/uL   RBC 4.25 3.87 - 5.11 MIL/uL   Hemoglobin 12.0 12.0 - 15.0 g/dL   HCT 61.7 63.9 - 53.9 %   MCV 89.9 80.0 - 100.0 fL   MCH 28.2 26.0 - 34.0 pg   MCHC 31.4 30.0 - 36.0 g/dL   RDW 86.2 88.4 - 84.4 %   Platelets 395 150 - 400 K/uL   nRBC 0.0 0.0 - 0.2 %   Neutrophils Relative % 89 %   Neutro Abs 15.3 (H) 1.7 - 7.7 K/uL   Lymphocytes Relative 6 %   Lymphs Abs 1.0 0.7 - 4.0 K/uL   Monocytes Relative 2 %   Monocytes Absolute 0.3 0.1 -  1.0 K/uL   Eosinophils Relative 1 %   Eosinophils Absolute 0.1 0.0 - 0.5 K/uL   Basophils Relative 1 %   Basophils Absolute 0.1 0.0 - 0.1 K/uL   Immature Granulocytes 1 %   Abs Immature Granulocytes 0.18 (H) 0.00 - 0.07 K/uL  Lactic acid, plasma     Status: None   Collection  Time: 09/29/23  8:46 PM  Result Value Ref Range   Lactic Acid, Venous 1.5 0.5 - 1.9 mmol/L  Glucose, capillary     Status: None   Collection Time: 09/29/23 11:06 PM  Result Value Ref Range   Glucose-Capillary 82 70 - 99 mg/dL  HIV Antibody (routine testing w rflx)     Status: None   Collection Time: 09/29/23 11:26 PM  Result Value Ref Range   HIV Screen 4th Generation wRfx Non Reactive Non Reactive  Urinalysis, w/ Reflex to Culture (Infection Suspected) -Urine, Clean Catch     Status: Abnormal   Collection Time: 09/30/23  1:54 AM  Result Value Ref Range   Specimen Source URINE, CLEAN CATCH    Color, Urine YELLOW YELLOW   APPearance CLEAR CLEAR   Specific Gravity, Urine 1.013 1.005 - 1.030   pH 5.0 5.0 - 8.0   Glucose, UA NEGATIVE NEGATIVE mg/dL   Hgb urine dipstick NEGATIVE NEGATIVE   Bilirubin Urine NEGATIVE NEGATIVE   Ketones, ur 5 (A) NEGATIVE mg/dL   Protein, ur 30 (A) NEGATIVE mg/dL   Nitrite NEGATIVE NEGATIVE   Leukocytes,Ua TRACE (A) NEGATIVE   RBC / HPF 0-5 0 - 5 RBC/hpf   WBC, UA 21-50 0 - 5 WBC/hpf   Bacteria, UA MANY (A) NONE SEEN   Squamous Epithelial / HPF 0-5 0 - 5 /HPF   Mucus PRESENT   Basic metabolic panel with GFR     Status: Abnormal   Collection Time: 09/30/23  3:34 AM  Result Value Ref Range   Sodium 133 (L) 135 - 145 mmol/L   Potassium 5.0 3.5 - 5.1 mmol/L   Chloride 105 98 - 111 mmol/L   CO2 19 (L) 22 - 32 mmol/L   Glucose, Bld 136 (H) 70 - 99 mg/dL   BUN 24 (H) 8 - 23 mg/dL   Creatinine, Ser 8.03 (H) 0.44 - 1.00 mg/dL   Calcium  9.7 8.9 - 10.3 mg/dL   GFR, Estimated 28 (L) >60 mL/min   Anion gap 9 5 - 15  Protime-INR     Status: Abnormal   Collection Time: 09/30/23  3:34 AM  Result Value Ref Range   Prothrombin Time 15.9 (H) 11.4 - 15.2 seconds   INR 1.2 0.8 - 1.2  APTT     Status: None   Collection Time: 09/30/23  3:34 AM  Result Value Ref Range   aPTT 34 24 - 36 seconds  CBC     Status: Abnormal   Collection Time: 09/30/23  3:34 AM   Result Value Ref Range   WBC 29.0 (H) 4.0 - 10.5 K/uL   RBC 3.97 3.87 - 5.11 MIL/uL   Hemoglobin 10.7 (L) 12.0 - 15.0 g/dL   HCT 64.6 (L) 63.9 - 53.9 %   MCV 88.9 80.0 - 100.0 fL   MCH 27.0 26.0 - 34.0 pg   MCHC 30.3 30.0 - 36.0 g/dL   RDW 86.3 88.4 - 84.4 %   Platelets 382 150 - 400 K/uL   nRBC 0.0 0.0 - 0.2 %  Glucose, capillary     Status: Abnormal   Collection Time:  09/30/23  5:04 AM  Result Value Ref Range   Glucose-Capillary 120 (H) 70 - 99 mg/dL   Recent Results (from the past 240 hours)  Microscopic Examination     Status: Abnormal   Collection Time: 09/29/23 10:00 AM   Urine  Result Value Ref Range Status   WBC, UA >30 (A) 0 - 5 /hpf Final   RBC, Urine 3-10 (A) 0 - 2 /hpf Final   Epithelial Cells (non renal) 0-10 0 - 10 /hpf Final   Bacteria, UA Few (A) None seen/Few Final    Renal Function: Recent Labs    09/23/23 1428 09/28/23 2022 09/29/23 2044 09/30/23 0334  CREATININE 1.66* 1.31* 2.04* 1.96*   Estimated Creatinine Clearance: 31 mL/min (A) (by C-G formula based on SCr of 1.96 mg/dL (H)).  Radiologic Imaging: CT RENAL STONE STUDY Result Date: 09/28/2023 CLINICAL DATA:  Nausea, known right-sided kidney stone, abdominal pain EXAM: CT ABDOMEN AND PELVIS WITHOUT CONTRAST TECHNIQUE: Multidetector CT imaging of the abdomen and pelvis was performed following the standard protocol without IV contrast. RADIATION DOSE REDUCTION: This exam was performed according to the departmental dose-optimization program which includes automated exposure control, adjustment of the mA and/or kV according to patient size and/or use of iterative reconstruction technique. COMPARISON:  04/03/2023 FINDINGS: Lower chest: No acute pleural or parenchymal lung disease. Hepatobiliary: Cholecystectomy. Unremarkable unenhanced appearance of the liver. No biliary duct dilation. Pancreas: Unremarkable unenhanced appearance. Spleen: Unremarkable unenhanced appearance. Adrenals/Urinary Tract: There is an  obstructing 17 mm right UPJ calculus, reference image 42/2. Moderate right-sided hydronephrosis, with significant right renal edema and perinephric fat stranding also noted. The left kidney is unremarkable. The adrenals and bladder are normal. Stomach/Bowel: No bowel obstruction or ileus. Normal appendix right mid abdomen. Scattered distal colonic diverticulosis without diverticulitis. No bowel wall thickening or inflammatory change. Vascular/Lymphatic: Aortic atherosclerosis. No enlarged abdominal or pelvic lymph nodes. Reproductive: Status post hysterectomy. No adnexal masses. Other: No free fluid or free intraperitoneal gas. No abdominal wall hernia. Musculoskeletal: No acute or destructive bony abnormalities. Reconstructed images demonstrate no additional findings. IMPRESSION: 1. Obstructing 17 mm right UPJ calculus, with moderate right hydronephrosis and significant right perinephric fat stranding. 2.  Aortic Atherosclerosis (ICD10-I70.0). Electronically Signed   By: Ozell Daring M.D.   On: 09/28/2023 21:11    I independently reviewed the above imaging studies.  Impression/Recommendation 63 year old female with an obstructing 17 mm right UPJ stone with concomitant UTI  -The risks, benefits and alternatives of cystoscopy with RIGHT JJ stent placement was discussed with the patient.  Risks include, but are not limited to: bleeding, urinary tract infection, ureteral injury, ureteral stricture disease, chronic pain, urinary symptoms, bladder injury, stent migration, the need for nephrostomy tube placement, MI, CVA, DVT, PE and the inherent risks with general anesthesia.  The patient voices understanding and wishes to proceed.    Lonni Han, MD [facility] 09/30/2023, 6:06 AM

## 2023-09-30 NOTE — Plan of Care (Signed)
  Problem: Activity: Goal: Risk for activity intolerance will decrease Outcome: Progressing   Problem: Clinical Measurements: Goal: Cardiovascular complication will be avoided Outcome: Progressing   Problem: Clinical Measurements: Goal: Respiratory complications will improve Outcome: Progressing   Problem: Clinical Measurements: Goal: Will remain free from infection Outcome: Progressing

## 2023-10-01 ENCOUNTER — Encounter (HOSPITAL_COMMUNITY): Payer: Self-pay | Admitting: Urology

## 2023-10-01 DIAGNOSIS — N390 Urinary tract infection, site not specified: Secondary | ICD-10-CM | POA: Diagnosis not present

## 2023-10-01 LAB — BASIC METABOLIC PANEL WITH GFR
Anion gap: 8 (ref 5–15)
BUN: 21 mg/dL (ref 8–23)
CO2: 22 mmol/L (ref 22–32)
Calcium: 9.5 mg/dL (ref 8.9–10.3)
Chloride: 109 mmol/L (ref 98–111)
Creatinine, Ser: 1.22 mg/dL — ABNORMAL HIGH (ref 0.44–1.00)
GFR, Estimated: 50 mL/min — ABNORMAL LOW (ref >60)
Glucose, Bld: 131 mg/dL — ABNORMAL HIGH (ref 70–99)
Potassium: 4.6 mmol/L (ref 3.5–5.1)
Sodium: 139 mmol/L (ref 135–145)

## 2023-10-01 LAB — CBC
HCT: 32.6 % — ABNORMAL LOW (ref 36.0–46.0)
Hemoglobin: 9.9 g/dL — ABNORMAL LOW (ref 12.0–15.0)
MCH: 27.3 pg (ref 26.0–34.0)
MCHC: 30.4 g/dL (ref 30.0–36.0)
MCV: 89.8 fL (ref 80.0–100.0)
Platelets: 351 K/uL (ref 150–400)
RBC: 3.63 MIL/uL — ABNORMAL LOW (ref 3.87–5.11)
RDW: 13.8 % (ref 11.5–15.5)
WBC: 28.6 K/uL — ABNORMAL HIGH (ref 4.0–10.5)
nRBC: 0 % (ref 0.0–0.2)

## 2023-10-01 LAB — URINE CULTURE: Culture: <10000 — AB

## 2023-10-01 LAB — GLUCOSE, CAPILLARY
Glucose-Capillary: 102 mg/dL — ABNORMAL HIGH (ref 70–99)
Glucose-Capillary: 116 mg/dL — ABNORMAL HIGH (ref 70–99)
Glucose-Capillary: 117 mg/dL — ABNORMAL HIGH (ref 70–99)
Glucose-Capillary: 135 mg/dL — ABNORMAL HIGH (ref 70–99)
Glucose-Capillary: 135 mg/dL — ABNORMAL HIGH (ref 70–99)
Glucose-Capillary: 73 mg/dL (ref 70–99)

## 2023-10-01 MED ORDER — FLUCONAZOLE 100 MG PO TABS
100.0000 mg | ORAL_TABLET | Freq: Every day | ORAL | Status: DC
  Administered 2023-10-01 – 2023-10-02 (×2): 100 mg via ORAL
  Filled 2023-10-01 (×2): qty 1

## 2023-10-01 NOTE — Progress Notes (Addendum)
 PROGRESS NOTE  Norma Sanchez  FMW:[id number] DOB: 1960/09/14 DOA: 09/29/2023 PCP: Melvenia Manus BRAVO, MD   Brief Narrative: Patient is a 63 year old female with history of nephrolithiasis, hypertension, hyperlipidemia, type 2 diabetes, obesity, hypothyroidism who presented with tongue swelling, dizziness, right abdominal pain.  Was having intermittent right abdominal/flank pain for more than a week.  Found to have 17 mm obstructing right UPJ stone with associated hydronephrosis, significant renal edema, perinephric stranding.  UA showed plenty of RBCs, WBCs.  Lab work showed leukocytosis, creatinine of 1.3, hyperkalemia.  On presentation, she was also found to have low-grade fever.  Urology consulted and she was transferred to [facility].  Started on ceftriaxone .  Underwent cystoscopy with right ureteral stent placement on 8/8.  Cultures pending.  Assessment & Plan:  Principal Problem:   Complicated UTI (urinary tract infection) Active Problems:   Morbid obesity with BMI of 40.0-44.9, adult (HCC)   Hypertension   Hypothyroidism   Hyperlipidemia   Kidney stones   Obstructive right ureterolithiasis: Presented with intermittent right-sided abdominal/flank pain.  Lab work showed leukocytosis, mild AKI.  UA was suspicious for UTI.  Also had low-grade fever.  Started on ceftriaxone .  Her previous urine culture showed E. coli, Klebsiella.  Urology consulted and she underwent right ureteral stent placement .  Will discontinue Foley catheter today  UTI: Follow-up urine culture.  Continue ceftriaxone .  Follow-up blood cultures.  Leukocytosis persist.  Afebrile this morning.  She says she gets yeast infection while on antibiotics.  Started on fluconazole   AKI  with hyperkalemia: Creatinine was 1.3 about 6 months ago.  Kidney function much improved now.  diabetes type 2: Takes metformin  at home.  Currently on sliding scale.  Monitor blood sugars  Hypertension: Takes amiloride , candesartan ,  propranolol  at home.  Currently blood pressure stable  Hyperlipidemia: On Lipitor  Depression: On Lexapro , bupropion   Hypothyroidism: On Synthyroid  Morbid obesity: Takes phentermine . BMI of  42.5            DVT prophylaxis:heparin  injection 5,000 Units Start: 09/30/23 0600     Code Status: Full Code  Family Communication: None at the bedside  Patient status: Inpatient  Patient is from : Home  Anticipated discharge to: Home  Estimated DC date: Tomorrow   Consultants: Urology  Procedures: Stent placement  Antimicrobials:  Anti-infectives (From admission, onward)    Start     Dose/Rate Route Frequency Ordered Stop   10/01/23 1100  fluconazole  (DIFLUCAN ) tablet 100 mg        100 mg Oral Daily 10/01/23 0944     09/30/23 2030  cefTRIAXone  (ROCEPHIN ) 2 g in sodium chloride  0.9 % 100 mL IVPB        2 g 200 mL/hr over 30 Minutes Intravenous Every 24 hours 09/29/23 2217     09/29/23 2030  cefTRIAXone  (ROCEPHIN ) 2 g in sodium chloride  0.9 % 100 mL IVPB        2 g 200 mL/hr over 30 Minutes Intravenous  Once 09/29/23 2022 09/29/23 2127       Subjective: Patient seen and examined at bedside today.  Hemodynamically stable.  Very comfortable.  No abdominal pain, nausea or vomiting.  No fever.  We discussed about removing Foley catheter.  Will continue keeping her here today keeping an eye on pending urine culture.  Will do CBC tomorrow  Objective: Vitals:   09/30/23 0836 09/30/23 1325 09/30/23 2004 10/01/23 0603  BP: 130/72 101/63 110/70 (!) 140/78  Pulse: 73 76 91 67  Resp: 16 16 17 15   Temp: 97.8 F (36.6 C) 98.6 F (37 C) 98.6 F (37 C) 98.4 F (36.9 C)  TempSrc: Oral Oral Oral Oral  SpO2: 98% 95% 93% 94%  Weight:      Height:        Intake/Output Summary (Last 24 hours) at 10/01/2023 1119 Last data filed at 10/01/2023 9090 Gross per 24 hour  Intake 739.95 ml  Output 2800 ml  Net -2060.05 ml   Filed Weights   09/29/23 1949  Weight: 98.9 kg     Examination:  General exam: Overall comfortable, not in distress,obese HEENT: PERRL Respiratory system:  no wheezes or crackles  Cardiovascular system: S1 & S2 heard, RRR.  Gastrointestinal system: Abdomen is nondistended, soft and nontender. Central nervous system: Alert and oriented Extremities: No edema, no clubbing ,no cyanosis Skin: No rashes, no ulcers,no icterus    GU: Foley   Data Reviewed: I have personally reviewed following labs and imaging studies  CBC: Recent Labs  Lab 09/28/23 2022 09/29/23 2046 09/30/23 0334 10/01/23 0436  WBC 20.0* 16.9* 29.0* 28.6*  NEUTROABS 16.9* 15.3*  --   --   HGB 13.4 12.0 10.7* 9.9*  HCT 41.4 38.2 35.3* 32.6*  MCV 87.5 89.9 88.9 89.8  PLT 483* 395 382 351   Basic Metabolic Panel: Recent Labs  Lab 09/28/23 2022 09/29/23 2044 09/30/23 0334 10/01/23 0436  NA 134* 133* 133* 139  K 5.4* 5.3* 5.0 4.6  CL 104 102 105 109  CO2 20* 20* 19* 22  GLUCOSE 147* 92 136* 131*  BUN 25* 27* 24* 21  CREATININE 1.31* 2.04* 1.96* 1.22*  CALCIUM  10.7* 10.0 9.7 9.5  MG 1.6*  --   --   --      Recent Results (from the past 240 hours)  Microscopic Examination     Status: Abnormal   Collection Time: 09/29/23 10:00 AM   Urine  Result Value Ref Range Status   WBC, UA >30 (A) 0 - 5 /hpf Final   RBC, Urine 3-10 (A) 0 - 2 /hpf Final   Epithelial Cells (non renal) 0-10 0 - 10 /hpf Final   Bacteria, UA Few (A) None seen/Few Final  Blood culture (routine x 2)     Status: None (Preliminary result)   Collection Time: 09/29/23  8:46 PM   Specimen: BLOOD  Result Value Ref Range Status   Specimen Description BLOOD BLOOD LEFT ARM  Final   Special Requests   Final    BOTTLES DRAWN AEROBIC AND ANAEROBIC Blood Culture adequate volume   Culture   Final    NO GROWTH 2 DAYS Performed at [facility], [address] [place], [place] [address]    Report Status PENDING  Incomplete  Blood culture (routine x 2)     Status: None (Preliminary result)    Collection Time: 09/29/23  8:59 PM   Specimen: BLOOD LEFT HAND  Result Value Ref Range Status   Specimen Description BLOOD LEFT HAND  Final   Special Requests   Final    AEROBIC BOTTLE ONLY Blood Culture results may not be optimal due to an inadequate volume of blood received in culture bottles   Culture   Final    NO GROWTH 2 DAYS Performed at [facility], [address], [place], [place] [address]    Report Status PENDING  Incomplete  Urine Culture     Status: Abnormal   Collection Time: 09/30/23  1:54 AM   Specimen: Urine, Random  Result Value Ref Range Status   Specimen Description   Final    URINE, RANDOM Performed at [facility], [address] [place], [place] [address]    Special Requests   Final    NONE Reflexed from [id number] Performed at [facility], [address], [place], [place] [address]    Culture (A)  Final    <10,000 COLONIES/mL INSIGNIFICANT GROWTH Performed at [facility], [address], [place], [place] [address]    Report Status 10/01/2023 FINAL  Final  Urine Culture     Status: None (Preliminary result)   Collection Time: 09/30/23  7:10 AM   Specimen: Urine, Cystoscope  Result Value Ref Range Status   Specimen Description   Final    CYSTOSCOPY Performed at [facility], [address] [place], [place] [address]    Special Requests   Final    NONE Performed at [facility], [address] [place], [place] [address]    Culture   Final    CULTURE REINCUBATED FOR BETTER GROWTH Performed at [facility], [address], [place], [place] [address]    Report Status PENDING  Incomplete     Radiology Studies: DG C-Arm 1-60 Min-No Report Result Date: 09/30/2023 Fluoroscopy was utilized by the requesting physician.  No radiographic interpretation.    Scheduled Meds:  aspirin  EC  81 mg Oral Daily   atorvastatin   10 mg Oral Daily   buPROPion   150 mg Oral q  morning   Chlorhexidine  Gluconate Cloth  6 each Topical Daily   escitalopram   20 mg Oral QPM   fluconazole   100 mg Oral Daily   fluticasone   2 spray Each Nare Daily   heparin   5,000 Units Subcutaneous Q8H   insulin  aspart  0-9 Units Subcutaneous Q4H   levothyroxine   137 mcg Oral Q0600   propranolol   80 mg Oral BID   sodium chloride  flush  3 mL Intravenous Q12H   Continuous Infusions:  sodium chloride  75 mL/hr at 10/01/23 0912   cefTRIAXone  (ROCEPHIN )  IV 2 g (09/30/23 2100)     LOS: 2 days   Ivonne Mustache, MD [facility] P8/10/2023, 11:19 AM

## 2023-10-01 NOTE — Plan of Care (Signed)
   Problem: Clinical Measurements: Goal: Diagnostic test results will improve Outcome: Progressing   Problem: Clinical Measurements: Goal: Respiratory complications will improve Outcome: Progressing   Problem: Clinical Measurements: Goal: Cardiovascular complication will be avoided Outcome: Progressing

## 2023-10-02 DIAGNOSIS — N390 Urinary tract infection, site not specified: Secondary | ICD-10-CM | POA: Diagnosis not present

## 2023-10-02 LAB — CBC
HCT: 31.7 % — ABNORMAL LOW (ref 36.0–46.0)
Hemoglobin: 9.9 g/dL — ABNORMAL LOW (ref 12.0–15.0)
MCH: 28 pg (ref 26.0–34.0)
MCHC: 31.2 g/dL (ref 30.0–36.0)
MCV: 89.5 fL (ref 80.0–100.0)
Platelets: 352 K/uL (ref 150–400)
RBC: 3.54 MIL/uL — ABNORMAL LOW (ref 3.87–5.11)
RDW: 13.8 % (ref 11.5–15.5)
WBC: 22.6 K/uL — ABNORMAL HIGH (ref 4.0–10.5)
nRBC: 0 % (ref 0.0–0.2)

## 2023-10-02 LAB — URINE CULTURE: Culture: 1000 — AB

## 2023-10-02 LAB — GLUCOSE, CAPILLARY
Glucose-Capillary: 125 mg/dL — ABNORMAL HIGH (ref 70–99)
Glucose-Capillary: 90 mg/dL (ref 70–99)

## 2023-10-02 MED ORDER — HYDROCODONE-ACETAMINOPHEN 5-325 MG PO TABS: 1.0000 | ORAL_TABLET | Freq: Four times a day (QID) | ORAL | 0 refills | Status: DC | PRN

## 2023-10-02 MED ORDER — CEFADROXIL 500 MG PO CAPS: 1000.0000 mg | ORAL_CAPSULE | Freq: Two times a day (BID) | ORAL | Status: DC

## 2023-10-02 MED ORDER — CEFUROXIME AXETIL 500 MG PO TABS: 500.0000 mg | ORAL_TABLET | Freq: Two times a day (BID) | ORAL | 0 refills | Status: AC

## 2023-10-02 MED ORDER — CEFUROXIME AXETIL 500 MG PO TABS: 500.0000 mg | ORAL_TABLET | Freq: Two times a day (BID) | ORAL | Status: DC

## 2023-10-02 NOTE — Discharge Summary (Addendum)
 Physician Discharge Summary  Norma Sanchez FMW:[id number] DOB: [birth year] DOA: 09/29/2023  PCP: Melvenia Manus BRAVO, MD  Admit date: 09/29/2023 Discharge date: 10/02/2023  Admitted From: Home Disposition:  Home  Discharge Condition:Stable CODE STATUS:FULL Diet recommendation: Heart Healthy   Brief/Interim Summary: Patient is a 63 year old female with history of nephrolithiasis, hypertension, hyperlipidemia, type 2 diabetes, obesity, hypothyroidism who presented with tongue swelling, dizziness, right abdominal pain.  Was having intermittent right abdominal/flank pain for more than a week.  Found to have 17 mm obstructing right UPJ stone with associated hydronephrosis, significant renal edema, perinephric stranding.  UA showed plenty of RBCs, WBCs.  Lab work showed leukocytosis, creatinine of 1.3, hyperkalemia.  On presentation, she was also found to have low-grade fever.  Urology consulted and she was transferred to [facility].  Started on ceftriaxone .  Underwent cystoscopy with right ureteral stent placement on 8/8.  Culture showed pansensitive Proteus.  Foley has been removed and she is voiding well.  She is afebrile.  Leukocytosis improving.  Medically stable for discharge home today.  She needs to follow-up with urology as an outpatient  Following problems were addressed during the hospitalization:  Obstructive right ureterolithiasis: Presented with intermittent right-sided abdominal/flank pain.  Lab work showed leukocytosis, mild AKI.  UA was suspicious for UTI.  Also had low-grade fever.  Started on ceftriaxone .  Urology consulted and she underwent right ureteral stent placement . Culture showed pansensitive Proteus.  Foley has been removed and she is voiding well.  She is afebrile.  Leukocytosis improving.      UTI: Urine culture showed Proteus.  Antibiotics changed to oral.  Blood culture negative so far   AKI  with hyperkalemia: Creatinine was 1.3 about 6 months ago.  Kidney function  much improved now.   diabetes type 2: Takes metformin  at home.     Hypertension: Takes amiloride , candesartan , propranolol  at home.  Currently blood pressure stable   Hyperlipidemia: On Lipitor   Depression: On Lexapro , bupropion    Hypothyroidism: On Synthyroid   Morbid obesity: Takes phentermine . BMI of  42.5         Discharge Diagnoses:  Principal Problem:   Complicated UTI (urinary tract infection) Active Problems:   Morbid obesity with BMI of 40.0-44.9, adult (HCC)   Hypertension   Hypothyroidism   Hyperlipidemia   Kidney stones    Discharge Instructions  Discharge Instructions     Diet - low sodium heart healthy   Complete by: As directed    Discharge instructions   Complete by: As directed    1) Please take your medications as instructed 2)Follow up with your PCP in a week.  Do a CBC, BMP tests during the follow-up 3)Please follow-up with urology as an outpatient.  Name and number the provider has been attached   Increase activity slowly   Complete by: As directed    No wound care   Complete by: As directed       Allergies as of 10/02/2023       Reactions   Avelox [moxifloxacin Hcl In Nacl] Anaphylaxis, Hives   Thrush, throat might have closed    Robaxin  [methocarbamol ] Swelling, Other (See Comments)   Tongue swelling, stroke-like symptoms   Semaglutide  Shortness Of Breath, Nausea And Vomiting, Rash   Skin peeling   Prednisone Other (See Comments)   Chest pains   Codeine Hives, Itching, Rash   Hydrochlorothiazide Itching   On hands and feet   Levothyroxine  Rash   Only to generic. Not to  brand synthroid .    Other Hives, Itching   All Cillins   Penicillins Hives   Sulfa Antibiotics Itching, Rash   Hands and feet itch and turn red   Zostavax [zoster Vaccine Live] Other (See Comments)   Redness around injection site/knot size of baseball bat/contracted shingles        Medication List     STOP taking these medications     nitrofurantoin  (macrocrystal-monohydrate) 100 MG capsule Commonly known as: MACROBID        TAKE these medications    aMILoride  5 MG tablet Commonly known as: MIDAMOR  Take 1 tablet (5 mg total) by mouth daily.   aspirin  EC 81 MG tablet Take 81 mg by mouth in the morning. Swallow whole.   aspirin -acetaminophen -caffeine 250-250-65 MG tablet Commonly known as: EXCEDRIN MIGRAINE Take 2 tablets by mouth every 6 (six) hours as needed for headache or migraine (pain).   atorvastatin  10 MG tablet Commonly known as: Lipitor Take 1 tablet (10 mg total) by mouth daily for cholesterol   buPROPion  150 MG 24 hr tablet Commonly known as: WELLBUTRIN  XL Take 1 tablet (150 mg total) by mouth every morning.   candesartan  32 MG tablet Commonly known as: ATACAND  Take 1 tablet (32 mg total) by mouth nightly for blood pressure   cefUROXime  500 MG tablet Commonly known as: CEFTIN  Take 1 tablet (500 mg total) by mouth 2 (two) times daily with a meal for 5 days.   cetirizine 10 MG tablet Commonly known as: ZYRTEC Take 10 mg by mouth in the morning.   escitalopram  20 MG tablet Commonly known as: LEXAPRO  Take 1 tablet (20 mg total) by mouth every evening.   esomeprazole  40 MG capsule Commonly known as: NEXIUM  Take 1 capsule (40 mg total) by mouth daily to prevent heartburn & indigestion   fluconazole  150 MG tablet Commonly known as: DIFLUCAN  Take once. May repeat in 3 days if symptoms fail to fully resolve after first dose. What changed:  how much to take how to take this when to take this   fluticasone  50 MCG/ACT nasal spray Commonly known as: FLONASE  Place 1 spray into both nostrils daily. What changed: when to take this   HYDROcodone -acetaminophen  5-325 MG tablet Commonly known as: NORCO/VICODIN Take 1 tablet by mouth every 6 (six) hours as needed for moderate pain (pain score 4-6) or severe pain (pain score 7-10).   meclizine  12.5 MG tablet Commonly known as: ANTIVERT  Take  1 tablet (12.5 mg total) by mouth 3 (three) times daily as needed for dizziness.   metFORMIN  500 MG 24 hr tablet Commonly known as: GLUCOPHAGE -XR Take 2 tablets (1,000 mg total) by mouth 2 (two) times daily.   ondansetron  8 MG disintegrating tablet Commonly known as: ZOFRAN -ODT Take 1 tablet (8 mg total) by mouth every 8 (eight) hours as needed for nausea or vomiting.   phentermine  37.5 MG tablet Commonly known as: ADIPEX-P  Take 1 tablet (37.5 mg total) by mouth daily before breakfast.   propranolol  80 MG tablet Commonly known as: INDERAL  Take 1 tablet (80 mg total) by mouth in the morning and in the evening for blood pressure.   Synthroid  137 MCG tablet Generic drug: levothyroxine  Take 1 tablet (137 mcg total) by mouth daily before breakfast.   VITAMIN C PO Take 1 tablet by mouth in the morning.        Follow-up Information     McKenzie, Belvie CROME, MD Follow up on 09/30/2023.   Specialty: Urology Why: Call to schedule an  appointment in 1 to 2 weeks to schedule your next kidney stone procedure Contact information: [address] [place] [address] [phone]                Allergies  Allergen Reactions   Avelox [Moxifloxacin Hcl In Nacl] Anaphylaxis and Hives    Thrush, throat might have closed    Robaxin  [Methocarbamol ] Swelling and Other (See Comments)    Tongue swelling, stroke-like symptoms   Semaglutide  Shortness Of Breath, Nausea And Vomiting and Rash    Skin peeling   Prednisone Other (See Comments)    Chest pains   Codeine Hives, Itching and Rash   Hydrochlorothiazide Itching    On hands and feet   Levothyroxine  Rash    Only to generic. Not to brand synthroid .    Other Hives and Itching    All Cillins   Penicillins Hives   Sulfa Antibiotics Itching and Rash    Hands and feet itch and turn red   Zostavax [Zoster Vaccine Live] Other (See Comments)    Redness around injection site/knot size of baseball bat/contracted  shingles    Consultations: Urology   Procedures/Studies: DG C-Arm 1-60 Min-No Report Result Date: 09/30/2023 Fluoroscopy was utilized by the requesting physician.  No radiographic interpretation.   CT RENAL STONE STUDY Result Date: 09/28/2023 CLINICAL DATA:  Nausea, known right-sided kidney stone, abdominal pain EXAM: CT ABDOMEN AND PELVIS WITHOUT CONTRAST TECHNIQUE: Multidetector CT imaging of the abdomen and pelvis was performed following the standard protocol without IV contrast. RADIATION DOSE REDUCTION: This exam was performed according to the departmental dose-optimization program which includes automated exposure control, adjustment of the mA and/or kV according to patient size and/or use of iterative reconstruction technique. COMPARISON:  04/03/2023 FINDINGS: Lower chest: No acute pleural or parenchymal lung disease. Hepatobiliary: Cholecystectomy. Unremarkable unenhanced appearance of the liver. No biliary duct dilation. Pancreas: Unremarkable unenhanced appearance. Spleen: Unremarkable unenhanced appearance. Adrenals/Urinary Tract: There is an obstructing 17 mm right UPJ calculus, reference image 42/2. Moderate right-sided hydronephrosis, with significant right renal edema and perinephric fat stranding also noted. The left kidney is unremarkable. The adrenals and bladder are normal. Stomach/Bowel: No bowel obstruction or ileus. Normal appendix right mid abdomen. Scattered distal colonic diverticulosis without diverticulitis. No bowel wall thickening or inflammatory change. Vascular/Lymphatic: Aortic atherosclerosis. No enlarged abdominal or pelvic lymph nodes. Reproductive: Status post hysterectomy. No adnexal masses. Other: No free fluid or free intraperitoneal gas. No abdominal wall hernia. Musculoskeletal: No acute or destructive bony abnormalities. Reconstructed images demonstrate no additional findings. IMPRESSION: 1. Obstructing 17 mm right UPJ calculus, with moderate right hydronephrosis  and significant right perinephric fat stranding. 2.  Aortic Atherosclerosis (ICD10-I70.0). Electronically Signed   By: Ozell Daring M.D.   On: 09/28/2023 21:11      Subjective: Patient seen and examined at bedside today.  Hemodynamically stable.  Afebrile.  Feels much better today.  Voiding by herself.  Complains of some mild headache today.  Medically stable for discharge.  Feels ready to go home  Discharge Exam: Vitals:   10/01/23 2022 10/02/23 0339  BP: 137/84 138/75  Pulse: 79 75  Resp: 18 20  Temp: 98.9 F (37.2 C) 99.3 F (37.4 C)  SpO2: 94% 90%   Vitals:   10/01/23 0603 10/01/23 1347 10/01/23 2022 10/02/23 0339  BP: (!) 140/78 (!) 154/88 137/84 138/75  Pulse: 67 79 79 75  Resp: 15 (!) 24 18 20   Temp: 98.4 F (36.9 C) 100 F (37.8 C) 98.9  F (37.2 C) 99.3 F (37.4 C)  TempSrc: Oral Oral Oral Oral  SpO2: 94% 98% 94% 90%  Weight:      Height:        General: Pt is alert, awake, not in acute distress, obese Cardiovascular: RRR, S1/S2 +, no rubs, no gallops Respiratory: CTA bilaterally, no wheezing, no rhonchi Abdominal: Soft, NT, ND, bowel sounds + Extremities: no edema, no cyanosis    The results of significant diagnostics from this hospitalization (including imaging, microbiology, ancillary and laboratory) are listed below for reference.     Microbiology: Recent Results (from the past 240 hours)  Microscopic Examination     Status: Abnormal   Collection Time: 09/29/23 10:00 AM   Urine  Result Value Ref Range Status   WBC, UA >30 (A) 0 - 5 /hpf Final   RBC, Urine 3-10 (A) 0 - 2 /hpf Final   Epithelial Cells (non renal) 0-10 0 - 10 /hpf Final   Bacteria, UA Few (A) None seen/Few Final  Blood culture (routine x 2)     Status: None (Preliminary result)   Collection Time: 09/29/23  8:46 PM   Specimen: BLOOD  Result Value Ref Range Status   Specimen Description BLOOD BLOOD LEFT ARM  Final   Special Requests   Final    BOTTLES DRAWN AEROBIC AND ANAEROBIC  Blood Culture adequate volume   Culture   Final    NO GROWTH 3 DAYS Performed at [facility], [address], [place], [place] [address]    Report Status PENDING  Incomplete  Blood culture (routine x 2)     Status: None (Preliminary result)   Collection Time: 09/29/23  8:59 PM   Specimen: BLOOD LEFT HAND  Result Value Ref Range Status   Specimen Description BLOOD LEFT HAND  Final   Special Requests   Final    AEROBIC BOTTLE ONLY Blood Culture results may not be optimal due to an inadequate volume of blood received in culture bottles   Culture   Final    NO GROWTH 3 DAYS Performed at [facility], [address] [place], [place] [address]    Report Status PENDING  Incomplete  Urine Culture     Status: Abnormal   Collection Time: 09/30/23  1:54 AM   Specimen: Urine, Random  Result Value Ref Range Status   Specimen Description   Final    URINE, RANDOM Performed at [facility], [address] [place], [place] [address]    Special Requests   Final    NONE Reflexed from [id number] Performed at [facility], [address] [place], [place] [address]    Culture (A)  Final    <10,000 COLONIES/mL INSIGNIFICANT GROWTH Performed at [facility], [address] [place], [place] [address]    Report Status 10/01/2023 FINAL  Final  Urine Culture     Status: Abnormal   Collection Time: 09/30/23  7:10 AM   Specimen: Urine, Cystoscope  Result Value Ref Range Status   Specimen Description   Final    CYSTOSCOPY Performed at [facility], [address] [place], [place] [address]    Special Requests   Final    NONE Performed at [facility], [address], [place], [place] [address]    Culture 1,000 COLONIES/mL PROTEUS MIRABILIS (A)  Final   Report Status 10/02/2023 FINAL  Final   Organism ID, Bacteria PROTEUS MIRABILIS (A)  Final  Susceptibility   Proteus mirabilis - MIC*    AMPICILLIN <=2 SENSITIVE  Sensitive     CEFEPIME <=0.12 SENSITIVE Sensitive     CEFTAZIDIME <=1 SENSITIVE Sensitive     CEFTRIAXONE  <=0.25 SENSITIVE Sensitive     CIPROFLOXACIN  <=0.25 SENSITIVE Sensitive     GENTAMICIN  <=1 SENSITIVE Sensitive     IMIPENEM 8 INTERMEDIATE Intermediate     TRIMETH/SULFA <=20 SENSITIVE Sensitive     AMPICILLIN/SULBACTAM <=2 SENSITIVE Sensitive     PIP/TAZO <=4 SENSITIVE Sensitive ug/mL    * 1,000 COLONIES/mL PROTEUS MIRABILIS     Labs: BNP (last 3 results) No results for input(s): BNP in the last 8760 hours. Basic Metabolic Panel: Recent Labs  Lab 09/28/23 2022 09/29/23 2044 09/30/23 0334 10/01/23 0436  NA 134* 133* 133* 139  K 5.4* 5.3* 5.0 4.6  CL 104 102 105 109  CO2 20* 20* 19* 22  GLUCOSE 147* 92 136* 131*  BUN 25* 27* 24* 21  CREATININE 1.31* 2.04* 1.96* 1.22*  CALCIUM  10.7* 10.0 9.7 9.5  MG 1.6*  --   --   --    Liver Function Tests: Recent Labs  Lab 09/28/23 2022  AST 17  ALT 26  ALKPHOS 119  BILITOT 0.7  PROT 7.6  ALBUMIN 3.4*   Recent Labs  Lab 09/28/23 2022  LIPASE 30   No results for input(s): AMMONIA in the last 168 hours. CBC: Recent Labs  Lab 09/28/23 2022 09/29/23 2046 09/30/23 0334 10/01/23 0436 10/02/23 0356  WBC 20.0* 16.9* 29.0* 28.6* 22.6*  NEUTROABS 16.9* 15.3*  --   --   --   HGB 13.4 12.0 10.7* 9.9* 9.9*  HCT 41.4 38.2 35.3* 32.6* 31.7*  MCV 87.5 89.9 88.9 89.8 89.5  PLT 483* 395 382 351 352   Cardiac Enzymes: No results for input(s): CKTOTAL, CKMB, CKMBINDEX, TROPONINI in the last 168 hours. BNP: Invalid input(s): POCBNP CBG: Recent Labs  Lab 10/01/23 1708 10/01/23 2019 10/01/23 2358 10/02/23 0330 10/02/23 0759  GLUCAP 102* 135* 73 125* 90   D-Dimer No results for input(s): DDIMER in the last 72 hours. Hgb A1c No results for input(s): HGBA1C in the last 72 hours. Lipid Profile No results for input(s): CHOL, HDL, LDLCALC, TRIG, CHOLHDL, LDLDIRECT in the last 72 hours. Thyroid   function studies No results for input(s): TSH, T4TOTAL, T3FREE, THYROIDAB in the last 72 hours.  Invalid input(s): FREET3 Anemia work up No results for input(s): VITAMINB12, FOLATE, FERRITIN, TIBC, IRON, RETICCTPCT in the last 72 hours. Urinalysis    Component Value Date/Time   COLORURINE YELLOW 09/30/2023 0154   APPEARANCEUR CLEAR 09/30/2023 0154   APPEARANCEUR Clear 09/29/2023 1000   LABSPEC 1.013 09/30/2023 0154   PHURINE 5.0 09/30/2023 0154   GLUCOSEU NEGATIVE 09/30/2023 0154   HGBUR NEGATIVE 09/30/2023 0154   BILIRUBINUR NEGATIVE 09/30/2023 0154   BILIRUBINUR Negative 09/29/2023 1000   KETONESUR 5 (A) 09/30/2023 0154   PROTEINUR 30 (A) 09/30/2023 0154   UROBILINOGEN CANCELED 12/21/2013 1024   NITRITE NEGATIVE 09/30/2023 0154   LEUKOCYTESUR TRACE (A) 09/30/2023 0154   Sepsis Labs Recent Labs  Lab 09/29/23 2046 09/30/23 0334 10/01/23 0436 10/02/23 0356  WBC 16.9* 29.0* 28.6* 22.6*   Microbiology Recent Results (from the past 240 hours)  Microscopic Examination     Status: Abnormal   Collection Time: 09/29/23 10:00 AM   Urine  Result Value Ref Range Status   WBC, UA >30 (A) 0 - 5 /hpf Final   RBC, Urine 3-10 (A) 0 - 2 /  hpf Final   Epithelial Cells (non renal) 0-10 0 - 10 /hpf Final   Bacteria, UA Few (A) None seen/Few Final  Blood culture (routine x 2)     Status: None (Preliminary result)   Collection Time: 09/29/23  8:46 PM   Specimen: BLOOD  Result Value Ref Range Status   Specimen Description BLOOD BLOOD LEFT ARM  Final   Special Requests   Final    BOTTLES DRAWN AEROBIC AND ANAEROBIC Blood Culture adequate volume   Culture   Final    NO GROWTH 3 DAYS Performed at [facility], [address], [place], [place] [address]    Report Status PENDING  Incomplete  Blood culture (routine x 2)     Status: None (Preliminary result)   Collection Time: 09/29/23  8:59 PM   Specimen: BLOOD LEFT HAND  Result Value Ref Range Status   Specimen  Description BLOOD LEFT HAND  Final   Special Requests   Final    AEROBIC BOTTLE ONLY Blood Culture results may not be optimal due to an inadequate volume of blood received in culture bottles   Culture   Final    NO GROWTH 3 DAYS Performed at [facility], [address], [place], [place] [address]    Report Status PENDING  Incomplete  Urine Culture     Status: Abnormal   Collection Time: 09/30/23  1:54 AM   Specimen: Urine, Random  Result Value Ref Range Status   Specimen Description   Final    URINE, RANDOM Performed at [facility], [address], [place], [place] [address]    Special Requests   Final    NONE Reflexed from [id number] Performed at [facility], [address] [place], [place] [address]    Culture (A)  Final    <10,000 COLONIES/mL INSIGNIFICANT GROWTH Performed at [facility], [address] [place], [place] [address]    Report Status 10/01/2023 FINAL  Final  Urine Culture     Status: Abnormal   Collection Time: 09/30/23  7:10 AM   Specimen: Urine, Cystoscope  Result Value Ref Range Status   Specimen Description   Final    CYSTOSCOPY Performed at [facility], [address], [place], [place] [address]    Special Requests   Final    NONE Performed at [facility], [address] [place], [place] [address]    Culture 1,000 COLONIES/mL PROTEUS MIRABILIS (A)  Final   Report Status 10/02/2023 FINAL  Final   Organism ID, Bacteria PROTEUS MIRABILIS (A)  Final      Susceptibility   Proteus mirabilis - MIC*    AMPICILLIN <=2 SENSITIVE Sensitive     CEFEPIME <=0.12 SENSITIVE Sensitive     CEFTAZIDIME <=1 SENSITIVE Sensitive     CEFTRIAXONE  <=0.25 SENSITIVE Sensitive     CIPROFLOXACIN  <=0.25 SENSITIVE Sensitive     GENTAMICIN  <=1 SENSITIVE Sensitive     IMIPENEM 8 INTERMEDIATE Intermediate     TRIMETH/SULFA <=20 SENSITIVE Sensitive     AMPICILLIN/SULBACTAM <=2 SENSITIVE Sensitive      PIP/TAZO <=4 SENSITIVE Sensitive ug/mL    * 1,000 COLONIES/mL PROTEUS MIRABILIS    Please note: You were cared for by a hospitalist during your hospital stay. Once you are discharged, your primary care physician will handle any further medical issues. Please note that NO REFILLS for any discharge medications will be authorized once you are discharged, as it is imperative that you return  to your primary care physician (or establish a relationship with a primary care physician if you do not have one) for your post hospital discharge needs so that they can reassess your need for medications and monitor your lab values.    Time coordinating discharge: 40 minutes  SIGNED:   Ivonne Mustache, MD  [facility] 10/02/2023, 1:59 PM Pager [phone]  If 7PM-7AM, please contact night-coverage [email or web address] Password [email or web address]

## 2023-10-03 ENCOUNTER — Telehealth: Payer: Self-pay | Admitting: Urology

## 2023-10-03 NOTE — Telephone Encounter (Signed)
 Needs surgery asap, 17mm stone

## 2023-10-04 LAB — CULTURE, BLOOD (ROUTINE X 2)
Culture: NO GROWTH
Culture: NO GROWTH
Special Requests: ADEQUATE

## 2023-10-04 LAB — URINE CULTURE

## 2023-10-04 NOTE — Telephone Encounter (Signed)
 Communication documented in workque.

## 2023-10-06 ENCOUNTER — Encounter: Admitting: Orthopedic Surgery

## 2023-10-06 ENCOUNTER — Other Ambulatory Visit: Payer: Self-pay | Admitting: Internal Medicine

## 2023-10-06 DIAGNOSIS — I10 Essential (primary) hypertension: Secondary | ICD-10-CM

## 2023-10-06 NOTE — Telephone Encounter (Unsigned)
 Copied from CRM #[id number]. Topic: Clinical - Medication Refill >> Oct 06, 2023 11:38 AM Nathanel BROCKS wrote: Medication: aMILoride  (MIDAMOR ) 5 MG tablet  Has the patient contacted their pharmacy? Yes  This is the patient's preferred pharmacy:  [facility] [address] [place] [address] Phone: [phone] Fax: [phone]  Is this the correct pharmacy for this prescription? Yes If no, delete pharmacy and type the correct one.   Has the prescription been filled recently? Yes  Is the patient out of the medication? Yes  Has the patient been seen for an appointment in the last year OR does the patient have an upcoming appointment? No  Can we respond through MyChart? Yes  Agent: Please be advised that Rx refills may take up to 3 business days. We ask that you follow-up with your pharmacy.

## 2023-10-07 ENCOUNTER — Other Ambulatory Visit (HOSPITAL_COMMUNITY): Payer: Self-pay

## 2023-10-07 ENCOUNTER — Other Ambulatory Visit: Payer: Self-pay

## 2023-10-07 ENCOUNTER — Encounter (HOSPITAL_COMMUNITY): Payer: Self-pay

## 2023-10-07 ENCOUNTER — Inpatient Hospital Stay (HOSPITAL_COMMUNITY)
Admission: RE | Admit: 2023-10-07 | Discharge: 2023-10-07 | Disposition: A | Discharge status: Home / Self Care | Source: Ambulatory Visit

## 2023-10-07 DIAGNOSIS — I10 Essential (primary) hypertension: Secondary | ICD-10-CM

## 2023-10-07 MED FILL — Amiloride HCl Tab 5 MG: ORAL | 90 days supply | Qty: 90 | Fill #0 | Status: AC

## 2023-10-10 ENCOUNTER — Encounter (HOSPITAL_COMMUNITY): Payer: Self-pay | Admitting: Urology

## 2023-10-10 ENCOUNTER — Ambulatory Visit (HOSPITAL_COMMUNITY)
Admission: RE | Admit: 2023-10-10 | Discharge: 2023-10-10 | Disposition: A | Discharge status: Home / Self Care | Source: Intra-hospital | Attending: Urology | Admitting: Urology

## 2023-10-10 ENCOUNTER — Ambulatory Visit (HOSPITAL_COMMUNITY)

## 2023-10-10 ENCOUNTER — Ambulatory Visit (HOSPITAL_COMMUNITY): Admitting: Anesthesiology

## 2023-10-10 ENCOUNTER — Ambulatory Visit (HOSPITAL_BASED_OUTPATIENT_CLINIC_OR_DEPARTMENT_OTHER): Admitting: Anesthesiology

## 2023-10-10 ENCOUNTER — Other Ambulatory Visit (HOSPITAL_COMMUNITY): Payer: Self-pay

## 2023-10-10 ENCOUNTER — Encounter (HOSPITAL_COMMUNITY): Admission: RE | Disposition: A | Discharge status: Home / Self Care | Payer: Self-pay | Attending: Urology

## 2023-10-10 DIAGNOSIS — I10 Essential (primary) hypertension: Secondary | ICD-10-CM | POA: Insufficient documentation

## 2023-10-10 DIAGNOSIS — Z6841 Body Mass Index (BMI) 40.0 and over, adult: Secondary | ICD-10-CM | POA: Insufficient documentation

## 2023-10-10 DIAGNOSIS — K219 Gastro-esophageal reflux disease without esophagitis: Secondary | ICD-10-CM | POA: Diagnosis not present

## 2023-10-10 DIAGNOSIS — E785 Hyperlipidemia, unspecified: Secondary | ICD-10-CM

## 2023-10-10 DIAGNOSIS — E66813 Obesity, class 3: Secondary | ICD-10-CM | POA: Diagnosis not present

## 2023-10-10 DIAGNOSIS — N202 Calculus of kidney with calculus of ureter: Secondary | ICD-10-CM | POA: Insufficient documentation

## 2023-10-10 DIAGNOSIS — E039 Hypothyroidism, unspecified: Secondary | ICD-10-CM | POA: Diagnosis not present

## 2023-10-10 DIAGNOSIS — N201 Calculus of ureter: Secondary | ICD-10-CM | POA: Diagnosis not present

## 2023-10-10 DIAGNOSIS — N179 Acute kidney failure, unspecified: Secondary | ICD-10-CM | POA: Diagnosis not present

## 2023-10-10 HISTORY — PX: CYSTOSCOPY/RETROGRADE/URETEROSCOPY/STONE EXTRACTION WITH BASKET: SHX5317

## 2023-10-10 HISTORY — PX: HOLMIUM LASER APPLICATION: SHX5852

## 2023-10-10 HISTORY — PX: CYSTOSCOPY/URETEROSCOPY/HOLMIUM LASER/STENT PLACEMENT: SHX6546

## 2023-10-10 LAB — GLUCOSE, CAPILLARY: Glucose-Capillary: 107 mg/dL — ABNORMAL HIGH (ref 70–99)

## 2023-10-10 SURGERY — CYSTOSCOPY/URETEROSCOPY/HOLMIUM LASER/STENT PLACEMENT
Anesthesia: General | Site: Ureter | Laterality: Right

## 2023-10-10 MED ORDER — MIDAZOLAM HCL 2 MG/2ML IJ SOLN
INTRAMUSCULAR | Status: AC
  Filled 2023-10-10: qty 2

## 2023-10-10 MED ORDER — DIPHENHYDRAMINE HCL 50 MG/ML IJ SOLN
INTRAMUSCULAR | Status: DC | PRN
  Administered 2023-10-10: 12.5 mg via INTRAVENOUS

## 2023-10-10 MED ORDER — FENTANYL CITRATE (PF) 100 MCG/2ML IJ SOLN
INTRAMUSCULAR | Status: AC
  Filled 2023-10-10: qty 2

## 2023-10-10 MED ORDER — CHLORHEXIDINE GLUCONATE 0.12 % MT SOLN
15.0000 mL | Freq: Once | OROMUCOSAL | Status: AC
  Administered 2023-10-10: 15 mL via OROMUCOSAL

## 2023-10-10 MED ORDER — DIATRIZOATE MEGLUMINE 30 % UR SOLN
URETHRAL | Status: AC
  Filled 2023-10-10: qty 100

## 2023-10-10 MED ORDER — FENTANYL CITRATE (PF) 100 MCG/2ML IJ SOLN
INTRAMUSCULAR | Status: DC | PRN
  Administered 2023-10-10: 100 ug via INTRAVENOUS

## 2023-10-10 MED ORDER — ONDANSETRON HCL 4 MG/2ML IJ SOLN
INTRAMUSCULAR | Status: DC | PRN
  Administered 2023-10-10: 4 mg via INTRAVENOUS

## 2023-10-10 MED ORDER — WATER FOR IRRIGATION, STERILE IR SOLN
Status: DC | PRN
  Administered 2023-10-10: 500 mL

## 2023-10-10 MED ORDER — FENTANYL CITRATE PF 50 MCG/ML IJ SOSY
25.0000 ug | PREFILLED_SYRINGE | INTRAMUSCULAR | Status: DC | PRN
  Administered 2023-10-10: 50 ug via INTRAVENOUS
  Filled 2023-10-10: qty 1

## 2023-10-10 MED ORDER — CEFAZOLIN SODIUM-DEXTROSE 2-4 GM/100ML-% IV SOLN
INTRAVENOUS | Status: AC
  Filled 2023-10-10: qty 100

## 2023-10-10 MED ORDER — SODIUM CHLORIDE 0.9 % IR SOLN
Status: DC | PRN
  Administered 2023-10-10: 3000 mL

## 2023-10-10 MED ORDER — SODIUM CHLORIDE 0.9 % IV SOLN: 12.5000 mg | INTRAVENOUS | Status: DC | PRN

## 2023-10-10 MED ORDER — PHENYLEPHRINE 80 MCG/ML (10ML) SYRINGE FOR IV PUSH (FOR BLOOD PRESSURE SUPPORT)
PREFILLED_SYRINGE | INTRAVENOUS | Status: DC | PRN
  Administered 2023-10-10 (×2): 160 ug via INTRAVENOUS

## 2023-10-10 MED ORDER — CEFAZOLIN SODIUM-DEXTROSE 2-4 GM/100ML-% IV SOLN
2.0000 g | INTRAVENOUS | Status: AC
  Administered 2023-10-10: 2 g via INTRAVENOUS

## 2023-10-10 MED ORDER — ORAL CARE MOUTH RINSE
15.0000 mL | Freq: Once | OROMUCOSAL | Status: AC
Start: 2023-10-10 — End: 2023-10-10

## 2023-10-10 MED ORDER — HYDROCODONE-ACETAMINOPHEN 5-325 MG PO TABS: 1.0000 | ORAL_TABLET | Freq: Four times a day (QID) | ORAL | 0 refills | Status: AC | PRN

## 2023-10-10 MED ORDER — MIDAZOLAM HCL 2 MG/2ML IJ SOLN
INTRAMUSCULAR | Status: DC | PRN
  Administered 2023-10-10: 2 mg via INTRAVENOUS

## 2023-10-10 MED ORDER — LACTATED RINGERS IV SOLN: INTRAVENOUS | Status: DC

## 2023-10-10 MED ORDER — SCOPOLAMINE 1 MG/3DAYS TD PT72
MEDICATED_PATCH | TRANSDERMAL | Status: DC
Start: 2023-10-10 — End: 2023-10-10
  Administered 2023-10-10: 1.5 mg via TRANSDERMAL
  Filled 2023-10-10: qty 1

## 2023-10-10 MED ORDER — EPHEDRINE SULFATE-NACL 50-0.9 MG/10ML-% IV SOSY
PREFILLED_SYRINGE | INTRAVENOUS | Status: DC | PRN
  Administered 2023-10-10: 10 mg via INTRAVENOUS
  Administered 2023-10-10 (×3): 5 mg via INTRAVENOUS

## 2023-10-10 MED ORDER — PROPOFOL 10 MG/ML IV BOLUS
INTRAVENOUS | Status: AC
  Filled 2023-10-10: qty 20

## 2023-10-10 MED ORDER — HYDROCODONE-ACETAMINOPHEN 5-325 MG PO TABS
1.0000 | ORAL_TABLET | Freq: Four times a day (QID) | ORAL | 0 refills | Status: DC | PRN
  Filled 2023-10-10: qty 15, 4d supply, fill #0

## 2023-10-10 MED ORDER — SCOPOLAMINE 1 MG/3DAYS TD PT72: 1.0000 | MEDICATED_PATCH | Freq: Once | TRANSDERMAL | Status: DC

## 2023-10-10 SURGICAL SUPPLY — 25 items
BAG DRAIN URO TABLE W/ADPT NS (BAG) ×1 IMPLANT
BAG HAMPER (MISCELLANEOUS) ×1 IMPLANT
CATH INTERMIT  6FR 70CM (CATHETERS) ×1 IMPLANT
CLOTH BEACON ORANGE TIMEOUT ST (SAFETY) ×1 IMPLANT
EXTRACTOR STONE NITINOL NGAGE (UROLOGICAL SUPPLIES) ×1 IMPLANT
GLOVE BIO SURGEON STRL SZ7 (GLOVE) IMPLANT
GLOVE BIO SURGEON STRL SZ8 (GLOVE) ×1 IMPLANT
GLOVE BIOGEL PI IND STRL 7.0 (GLOVE) ×2 IMPLANT
GLOVE ECLIPSE 6.5 STRL STRAW (GLOVE) IMPLANT
GOWN STRL REUS W/TWL LRG LVL3 (GOWN DISPOSABLE) ×1 IMPLANT
GOWN STRL REUS W/TWL XL LVL3 (GOWN DISPOSABLE) ×1 IMPLANT
GUIDEWIRE ANG ZIPWIRE 038X150 (WIRE) ×1 IMPLANT
GUIDEWIRE STR DUAL SENSOR (WIRE) ×1 IMPLANT
KIT TURNOVER CYSTO (KITS) ×1 IMPLANT
MANIFOLD NEPTUNE II (INSTRUMENTS) ×1 IMPLANT
PACK CYSTO (CUSTOM PROCEDURE TRAY) ×1 IMPLANT
PAD ARMBOARD POSITIONER FOAM (MISCELLANEOUS) ×1 IMPLANT
POSITIONER HEAD 8X9X4 ADT (SOFTGOODS) ×1 IMPLANT
SHEATH URETERAL 12FRX35CM (MISCELLANEOUS) IMPLANT
SOL .9 NS 3000ML IRR UROMATIC (IV SOLUTION) ×2 IMPLANT
STENT URET 6FRX26 CONTOUR (STENTS) IMPLANT
SYR 10ML LL (SYRINGE) ×1 IMPLANT
TOWEL OR 17X26 4PK STRL BLUE (TOWEL DISPOSABLE) ×1 IMPLANT
TRACTIP FLEXIVA PULS ID 200XHI (Laser) IMPLANT
WATER STERILE IRR 500ML POUR (IV SOLUTION) ×1 IMPLANT

## 2023-10-10 NOTE — Anesthesia Postprocedure Evaluation (Signed)
 Anesthesia Post Note  Patient: Norma Sanchez  Procedure(s) Performed: CYSTOSCOPY/URETEROSCOPY/HOLMIUM LASER/STENT EXCHANGE (Right: Ureter) HOLMIUM LASER APPLICATION (Right: Ureter) CYSTOSCOPY, WITH CALCULUS REMOVAL USING BASKET (Right: Ureter)  Patient location during evaluation: Phase II Anesthesia Type: General Level of consciousness: awake and alert Pain management: pain level controlled Vital Signs Assessment: post-procedure vital signs reviewed and stable Respiratory status: spontaneous breathing, nonlabored ventilation, respiratory function stable and patient connected to nasal cannula oxygen Cardiovascular status: blood pressure returned to baseline and stable Postop Assessment: no apparent nausea or vomiting Anesthetic complications: no   There were no known notable events for this encounter.   Last Vitals:  Vitals:   10/10/23 1141 10/10/23 1200  BP: (!) 151/85 126/79  Pulse: 67   Resp: (!) 22 16  Temp:  36.5 C  SpO2: 95% 98%    Last Pain:  Vitals:   10/10/23 1201  PainSc: 2                  Candela Krul L Benzion Mesta

## 2023-10-10 NOTE — Op Note (Signed)
 Preoperative diagnosis: Right renal stone  Postoperative diagnosis: Same  Procedure: 1 cystoscopy 2. Right retrograde pyelography 3.  Intraoperative fluoroscopy, under one hour, with interpretation 4.  Right ureteroscopic stone manipulation with laser lithotripsy 5.  right 6 x 26 JJ stent placement  Attending: [name]  Anesthesia: General  Estimated blood loss: None  Drains: Right 6 x 26 JJ ureteral stent with tether  Specimens: stone for analysis  Antibiotics: ancef   Findings: Right UPJ/renal pelvis stone. No hydronephrosis. No masses/lesions in the bladder. Ureteral orifices in normal anatomic location.  Indications: Patient is a 63 year old female with a history of a right UPJ stone who underwent ureteral stent placement 2 weeks ago for sepsis.  After discussing treatment options, she decided proceed with right ureteroscopic stone manipulation.  Procedure in detail: The patient was brought to the operating room and a brief timeout was done to ensure correct patient, correct procedure, correct site.  General anesthesia was administered patient was placed in dorsal lithotomy position.  Her genitalia was then prepped and draped in usual sterile fashion.  A rigid 22 French cystoscope was passed in the urethra and the bladder.  Bladder was inspected free masses or lesions.  the ureteral orifices were in the normal orthotopic locations.  a 6 french ureteral catheter was then instilled into the right ureteral orifice.  a gentle retrograde was obtained and findings noted above. Using a grasper the right ureteral stent was brought to the urethral meatus.  we then placed a zip wire through the ureteral stent and advanced up to the renal pelvis.  We then removed the stent. we then removed the cystoscope and cannulated the right ureteral orifice with a semirigid ureteroscope.  No stone was found in the ureter. Once we reached the UPJ a sensor wire was advanced in to the renal pelvis. We  then removed the ureteroscope and advanced am 12/14 x 35cm access sheath up to the renal pelvis. We then used the flexible ureteroscope to perform nephroscopy. We encountered the stone iat the UPJ. Using a 242nm laser fiber the stone was fragmented and the fragments were removed with a Ngage basket.    once all stone fragments were removed we then removed the access sheath under direct vision and noted no injury to the ureter. We then placed a 6 x 26 double-j ureteral stent over the original zip wire.  We then removed the wire and good coil was noted in the the renal pelvis under fluoroscopy and the bladder under direct vision. the bladder was then drained and this concluded the procedure which was well tolerated by patient.  Complications: None  Condition: Stable, extubated, transferred to PACU  Plan: Patient is to be discharged home as to follow-up in one week. She is to remove her stent in 72 hours by pulling the tether

## 2023-10-10 NOTE — Interval H&P Note (Signed)
 History and Physical Interval Note:  10/10/2023 9:15 AM  Norma Sanchez  has presented today for surgery, with the diagnosis of right ureteral stone.  The various methods of treatment have been discussed with the patient and family. After consideration of risks, benefits and other options for treatment, the patient has consented to  Procedure(s) with comments: CYSTOSCOPY/URETEROSCOPY/HOLMIUM LASER/STENT PLACEMENT (Right) - stent exchange HOLMIUM LASER APPLICATION (Right) as a surgical intervention.  The patient's history has been reviewed, patient examined, no change in status, stable for surgery.  I have reviewed the patient's chart and labs.  Questions were answered to the patient's satisfaction.     [name]

## 2023-10-10 NOTE — Transfer of Care (Signed)
 Immediate Anesthesia Transfer of Care Note  Patient: Norma Sanchez  Procedure(s) Performed: CYSTOSCOPY/URETEROSCOPY/HOLMIUM LASER/STENT EXCHANGE (Right: Ureter) HOLMIUM LASER APPLICATION (Right: Ureter)  Patient Location: PACU  Anesthesia Type:General  Level of Consciousness: awake, alert , oriented, and patient cooperative  Airway & Oxygen Therapy: Patient Spontanous Breathing and Patient connected to face mask oxygen  Post-op Assessment: Report given to RN, Post -op Vital signs reviewed and stable, and Patient moving all extremities X 4  Post vital signs: Reviewed and stable  Last Vitals:  Vitals Value Taken Time  BP    Temp    Pulse    Resp    SpO2      Last Pain:  Vitals:   10/10/23 0809  PainSc: 0-No pain         Complications: No notable events documented.

## 2023-10-10 NOTE — Anesthesia Procedure Notes (Signed)
 Procedure Name: LMA Insertion Date/Time: 10/10/2023 9:41 AM  Performed by: Cordella Elvie HERO, CRNAPre-anesthesia Checklist: Patient identified, Emergency Drugs available, Suction available, Patient being monitored and Timeout performed Patient Re-evaluated:Patient Re-evaluated prior to induction Oxygen Delivery Method: Circle system utilized Preoxygenation: Pre-oxygenation with 100% oxygen Induction Type: IV induction LMA: LMA inserted LMA Size: 4.0 Number of attempts: 1 Placement Confirmation: ETT inserted through vocal cords under direct vision, positive ETCO2, CO2 detector and breath sounds checked- equal and bilateral Tube secured with: Tape Dental Injury: Teeth and Oropharynx as per pre-operative assessment

## 2023-10-10 NOTE — Anesthesia Preprocedure Evaluation (Addendum)
 Anesthesia Evaluation  Patient identified by MRN, date of birth, ID band Patient awake  General Assessment Comment:Oral trauma from airway device  Reviewed: Allergy & Precautions, NPO status , Patient's Chart, lab work & pertinent test results, reviewed documented beta blocker date and time   History of Anesthesia Complications (+) PONV and history of anesthetic complications  Airway Mallampati: III  TM Distance: >3 FB     Dental no notable dental hx. (+) Teeth Intact, Dental Advisory Given   Pulmonary neg pulmonary ROS   Pulmonary exam normal breath sounds clear to auscultation       Cardiovascular hypertension, Pt. on medications Normal cardiovascular exam+ dysrhythmias  Rhythm:Regular Rate:Normal     Neuro/Psych  Neuromuscular disease  negative psych ROS   GI/Hepatic ,GERD  ,,(+) Hepatitis -, B  Endo/Other  Hypothyroidism  Class 3 obesityHLD  Renal/GU Renal InsufficiencyRenal diseaseRight Nephrolithiasis/hydronephrosis  Lab Results      Component                Value               Date                      NA                       133 (L)             09/30/2023                CL                       105                 09/30/2023                K                        5.0                 09/30/2023                CO2                      19 (L)              09/30/2023                BUN                      24 (H)              09/30/2023                CREATININE               1.96 (H)            09/30/2023                    GLUCOSE                  136 (H)             09/30/2023             negative genitourinary   Musculoskeletal  (+) Arthritis ,    Abdominal  (+) + obese  Peds  Hematology  (+)  Blood dyscrasia, anemia Lab Results      Component                Value               Date                      WBC                      29.0 (H)            09/30/2023                HGB                      10.7  (L)            09/30/2023                HCT                      35.3 (L)            09/30/2023                MCV                      88.9                09/30/2023                PLT                      382                 09/30/2023           Heparin  this am   Anesthesia Other Findings All: See list  Reproductive/Obstetrics                              Anesthesia Physical Anesthesia Plan  ASA: 3  Anesthesia Plan: General   Post-op Pain Management: Tylenol  PO (pre-op)* and Dilaudid  IV   Induction: Intravenous  PONV Risk Score and Plan: 4 or greater and Treatment may vary due to age or medical condition, Ondansetron , Midazolam  and Scopolamine  patch - Pre-op  Airway Management Planned: Oral ETT  Additional Equipment: None  Intra-op Plan:   Post-operative Plan: Extubation in OR  Informed Consent: I have reviewed the patients History and Physical, chart, labs and discussed the procedure including the risks, benefits and alternatives for the proposed anesthesia with the patient or authorized representative who has indicated his/her understanding and acceptance.     Dental advisory given  Plan Discussed with: CRNA and Surgeon  Anesthesia Plan Comments:          Anesthesia Quick Evaluation

## 2023-10-11 ENCOUNTER — Encounter (HOSPITAL_COMMUNITY): Payer: Self-pay | Admitting: Urology

## 2023-10-11 ENCOUNTER — Telehealth: Payer: Self-pay

## 2023-10-11 NOTE — Telephone Encounter (Signed)
 Pt called and lvm stating she wanted to know if it is normal to have incontinence  after having a stone removed pt had stone removed on 08/18 pt was advised that it was normal and should improve but I would send a message to MD for further advisement

## 2023-10-12 ENCOUNTER — Other Ambulatory Visit

## 2023-10-14 ENCOUNTER — Telehealth: Payer: Self-pay | Admitting: Urology

## 2023-10-14 NOTE — Telephone Encounter (Signed)
 No she does not.  She can follow up as scheduled.  If she develops a fever go to ER to be evaluated.

## 2023-10-14 NOTE — Telephone Encounter (Signed)
 Called patient let her know that she did not need to bring a urine sample by

## 2023-10-14 NOTE — Telephone Encounter (Signed)
 Patient called she had stone removed on Monday she needs to know if she needs to bring a Urine sample to the office and drop it off?

## 2023-10-17 ENCOUNTER — Other Ambulatory Visit (HOSPITAL_COMMUNITY): Payer: Self-pay

## 2023-10-18 ENCOUNTER — Other Ambulatory Visit (HOSPITAL_COMMUNITY): Payer: Self-pay

## 2023-10-31 ENCOUNTER — Ambulatory Visit (INDEPENDENT_AMBULATORY_CARE_PROVIDER_SITE_OTHER): Admitting: Urology

## 2023-10-31 ENCOUNTER — Encounter: Payer: Self-pay | Admitting: Urology

## 2023-10-31 VITALS — BP 113/73 | HR 72 | Resp 16 | Ht 67.0 in | Wt 219.8 lb

## 2023-10-31 DIAGNOSIS — N200 Calculus of kidney: Secondary | ICD-10-CM

## 2023-10-31 LAB — MICROSCOPIC EXAMINATION: WBC, UA: >30 /HPF — AB (ref 0–5)

## 2023-10-31 LAB — URINALYSIS, ROUTINE W REFLEX MICROSCOPIC
Bilirubin, UA: NEGATIVE
Glucose, UA: NEGATIVE
Ketones, UA: NEGATIVE
Nitrite, UA: NEGATIVE
RBC, UA: NEGATIVE
Specific Gravity, UA: 1.025 (ref 1.005–1.030)
Urobilinogen, Ur: 1 mg/dL (ref 0.2–1.0)
pH, UA: 6 (ref 5.0–7.5)

## 2023-10-31 NOTE — Patient Instructions (Signed)

## 2023-10-31 NOTE — Addendum Note (Signed)
 Addended by: Alastor Kneale L on: 10/31/2023 02:00 PM   Modules accepted: Orders

## 2023-10-31 NOTE — Progress Notes (Signed)
 10/31/2023 1:56 PM   Norma Sanchez 1960-08-11 [id number]  Referring provider: Melvenia Manus BRAVO, MD [address] [place],  [place] [address]  Followup nephrolithiasis   HPI: Norma Sanchez is a 63yo here for followup for nephrolithiasis. She underwent right ureteroscopic stone extraction 8/18 and removed her stent POD#3. She denies nay significant flank pain. No significant LUTS. No hematuria or dysuria. Stone analysis pending.    PMH: Past Medical History:  Diagnosis Date   Bilateral carpal tunnel syndrome    Biliary dyskinesia 12/07/2011   Fatty liver    GERD (gastroesophageal reflux disease)    Hepatitis B    High cholesterol    History of kidney stones    Hypertension    Hypothyroidism    Multinodular goiter    PONV (postoperative nausea and vomiting)    Pre-diabetes    Skin cancer    wrist and eyebrow    Surgical History: Past Surgical History:  Procedure Laterality Date   ABDOMINAL HYSTERECTOMY  8 yrs ago   1 ovary spared   CARPAL TUNNEL RELEASE  yrs ago   both wrists   CATARACT EXTRACTION W/PHACO Right 05/04/2021   Procedure: CATARACT EXTRACTION PHACO AND INTRAOCULAR LENS PLACEMENT (IOC);  Surgeon: Harrie Agent, MD;  Location: [facility];  Service: Ophthalmology;  Laterality: Right;  CDE: 1.08   CATARACT EXTRACTION W/PHACO Left 05/18/2021   Procedure: CATARACT EXTRACTION PHACO AND INTRAOCULAR LENS PLACEMENT (IOC);  Surgeon: Harrie Agent, MD;  Location: [facility];  Service: Ophthalmology;  Laterality: Left;  CDE: 2.63   CHOLECYSTECTOMY  12/28/2011   Procedure: LAPAROSCOPIC CHOLECYSTECTOMY WITH INTRAOPERATIVE CHOLANGIOGRAM;  Surgeon: Krystal JINNY Russell, MD;  Location: [facility];  Service: General;  Laterality: N/A;  Laparoscopic cholecystectomy with attempted cholangiogram   CYSTOSCOPY WITH STENT PLACEMENT Right 09/30/2023   Procedure: CYSTOSCOPY, WITH STENT INSERTION;  Surgeon: Devere Lonni Righter, MD;  Location: [facility];  Service: Urology;  Laterality: Right;    CYSTOSCOPY/RETROGRADE/URETEROSCOPY/STONE EXTRACTION WITH BASKET Right 10/10/2023   Procedure: CYSTOSCOPY, WITH CALCULUS REMOVAL USING BASKET;  Surgeon: Sherrilee Belvie CROME, MD;  Location: [facility];  Service: Urology;  Laterality: Right;   CYSTOSCOPY/URETEROSCOPY/HOLMIUM LASER/STENT PLACEMENT Left 04/07/2023   Procedure: CYSTOSCOPY LEFT /URETEROSCOPY/HOLMIUM LASER/STENT PLACEMENT AND RETROGRADE PYELOGRAM;  Surgeon: Shane Steffan BROCKS, MD;  Location: [facility];  Service: Urology;  Laterality: Left;  60 MINUTE CASE   CYSTOSCOPY/URETEROSCOPY/HOLMIUM LASER/STENT PLACEMENT Right 10/10/2023   Procedure: CYSTOSCOPY/URETEROSCOPY/HOLMIUM LASER/STENT EXCHANGE;  Surgeon: Sherrilee Belvie CROME, MD;  Location: [facility];  Service: Urology;  Laterality: Right;   HOLMIUM LASER APPLICATION Right 10/10/2023   Procedure: HOLMIUM LASER APPLICATION;  Surgeon: Sherrilee Belvie CROME, MD;  Location: [facility];  Service: Urology;  Laterality: Right;   KNEE SURGERY  arthroscopic, 3 yrs ago   right knee twice   radioactive iodine  to thyroid '  04/2011   TONSILLECTOMY  age 46   TUBAL LIGATION  yrs ago    Home Medications:  Allergies as of 10/31/2023       Reactions   Avelox [moxifloxacin Hcl In Nacl] Anaphylaxis, Hives   Thrush, throat might have closed    Percocet [oxycodone -acetaminophen ] Anaphylaxis   Robaxin  [methocarbamol ] Swelling, Other (See Comments)   Tongue swelling, stroke-like symptoms   Semaglutide  Shortness Of Breath, Nausea And Vomiting, Rash   Skin peeling   Prednisone Other (See Comments)   Chest pains   Codeine Hives, Itching, Rash   Hydrochlorothiazide Itching   On hands and feet   Levothyroxine  Rash   Only to generic.  Not to brand synthroid .    Other Hives, Itching   All Cillins   Penicillins Hives   Sulfa Antibiotics Itching, Rash   Hands and feet itch and turn red   Zostavax [zoster Vaccine Live] Other (See Comments)   Redness around injection site/knot size of baseball bat/contracted shingles         Medication List        Accurate as of October 31, 2023  1:56 PM. If you have any questions, ask your nurse or doctor.          aMILoride  5 MG tablet Commonly known as: MIDAMOR  Take 1 tablet (5 mg total) by mouth daily.   aspirin  EC 81 MG tablet Take 81 mg by mouth in the morning. Swallow whole.   aspirin -acetaminophen -caffeine 250-250-65 MG tablet Commonly known as: EXCEDRIN MIGRAINE Take 2 tablets by mouth every 6 (six) hours as needed for headache or migraine (pain).   atorvastatin  10 MG tablet Commonly known as: Lipitor Take 1 tablet (10 mg total) by mouth daily for cholesterol   buPROPion  150 MG 24 hr tablet Commonly known as: WELLBUTRIN  XL Take 1 tablet (150 mg total) by mouth every morning.   candesartan  32 MG tablet Commonly known as: ATACAND  Take 1 tablet (32 mg total) by mouth nightly for blood pressure   cetirizine 10 MG tablet Commonly known as: ZYRTEC Take 10 mg by mouth in the morning.   escitalopram  20 MG tablet Commonly known as: LEXAPRO  Take 1 tablet (20 mg total) by mouth every evening.   esomeprazole  40 MG capsule Commonly known as: NEXIUM  Take 1 capsule (40 mg total) by mouth daily to prevent heartburn & indigestion   fluconazole  150 MG tablet Commonly known as: DIFLUCAN  Take once. May repeat in 3 days if symptoms fail to fully resolve after first dose. What changed:  how much to take how to take this when to take this   fluticasone  50 MCG/ACT nasal spray Commonly known as: FLONASE  Place 1 spray into both nostrils daily. What changed: when to take this   HYDROcodone -acetaminophen  5-325 MG tablet Commonly known as: NORCO/VICODIN Take 1 tablet by mouth every 6 (six) hours as needed for moderate pain (pain score 4-6) or severe pain (pain score 7-10).   meclizine  12.5 MG tablet Commonly known as: ANTIVERT  Take 1 tablet (12.5 mg total) by mouth 3 (three) times daily as needed for dizziness.   metFORMIN  500 MG 24 hr  tablet Commonly known as: GLUCOPHAGE -XR Take 2 tablets (1,000 mg total) by mouth 2 (two) times daily.   ondansetron  8 MG disintegrating tablet Commonly known as: ZOFRAN -ODT Take 1 tablet (8 mg total) by mouth every 8 (eight) hours as needed for nausea or vomiting.   phentermine  37.5 MG tablet Commonly known as: ADIPEX-P  Take 1 tablet (37.5 mg total) by mouth daily before breakfast.   propranolol  80 MG tablet Commonly known as: INDERAL  Take 1 tablet (80 mg total) by mouth in the morning and in the evening for blood pressure.   Synthroid  137 MCG tablet Generic drug: levothyroxine  Take 1 tablet (137 mcg total) by mouth daily before breakfast.   VITAMIN C PO Take 1 tablet by mouth in the morning.        Allergies:  Allergies  Allergen Reactions   Avelox [Moxifloxacin Hcl In Nacl] Anaphylaxis and Hives    Thrush, throat might have closed    Percocet [Oxycodone -Acetaminophen ] Anaphylaxis   Robaxin  [Methocarbamol ] Swelling and Other (See Comments)    Tongue swelling, stroke-like symptoms  Semaglutide  Shortness Of Breath, Nausea And Vomiting and Rash    Skin peeling   Prednisone Other (See Comments)    Chest pains   Codeine Hives, Itching and Rash   Hydrochlorothiazide Itching    On hands and feet   Levothyroxine  Rash    Only to generic. Not to brand synthroid .    Other Hives and Itching    All Cillins   Penicillins Hives   Sulfa Antibiotics Itching and Rash    Hands and feet itch and turn red   Zostavax [Zoster Vaccine Live] Other (See Comments)    Redness around injection site/knot size of baseball bat/contracted shingles    Family History: Family History  Problem Relation Age of Onset   Stroke Mother    Breast cancer Mother 37   Alzheimer's disease Mother    Stroke Father    Heart attack Father    AAA (abdominal aortic aneurysm) Father        rupture, smoker   Alcoholism Sister    Heart attack Brother 63   CAD Brother    Suicidality Maternal  Grandmother    Suicidality Maternal Grandfather    Heart attack Paternal Grandmother        70s    Social History:  reports that she has never smoked. She has never used smokeless tobacco. She reports that she does not drink alcohol and does not use drugs.  ROS: All other review of systems were reviewed and are negative except what is noted above in HPI  Physical Exam: BP 113/73   Pulse 72   Constitutional:  Alert and oriented, No acute distress. HEENT: [place] AT, moist mucus membranes.  Trachea midline, no masses. Cardiovascular: No clubbing, cyanosis, or edema. Respiratory: Normal respiratory effort, no increased work of breathing. GI: Abdomen is soft, nontender, nondistended, no abdominal masses GU: No CVA tenderness.  Lymph: No cervical or inguinal lymphadenopathy. Skin: No rashes, bruises or suspicious lesions. Neurologic: Grossly intact, no focal deficits, moving all 4 extremities. Psychiatric: Normal mood and affect.  Laboratory Data: Lab Results  Component Value Date   WBC 22.6 (H) 10/02/2023   HGB 9.9 (L) 10/02/2023   HCT 31.7 (L) 10/02/2023   MCV 89.5 10/02/2023   PLT 352 10/02/2023    Lab Results  Component Value Date   CREATININE 1.22 (H) 10/01/2023    No results found for: PSA  No results found for: TESTOSTERONE  Lab Results  Component Value Date   HGBA1C 5.8 (H) 06/07/2023    Urinalysis    Component Value Date/Time   COLORURINE YELLOW 09/30/2023 0154   APPEARANCEUR CLEAR 09/30/2023 0154   APPEARANCEUR Clear 09/29/2023 1000   LABSPEC 1.013 09/30/2023 0154   PHURINE 5.0 09/30/2023 0154   GLUCOSEU NEGATIVE 09/30/2023 0154   HGBUR NEGATIVE 09/30/2023 0154   BILIRUBINUR NEGATIVE 09/30/2023 0154   BILIRUBINUR Negative 09/29/2023 1000   KETONESUR 5 (A) 09/30/2023 0154   PROTEINUR 30 (A) 09/30/2023 0154   UROBILINOGEN CANCELED 12/21/2013 1024   NITRITE NEGATIVE 09/30/2023 0154   LEUKOCYTESUR TRACE (A) 09/30/2023 0154    Lab Results  Component  Value Date   LABMICR See below: 09/29/2023   WBCUA >30 (A) 09/29/2023   LABEPIT 0-10 09/29/2023   BACTERIA MANY (A) 09/30/2023    Pertinent Imaging:  No results found for this or any previous visit.  No results found for this or any previous visit.  No results found for this or any previous visit.  No results found for this or any  previous visit.  No results found for this or any previous visit.  No results found for this or any previous visit.  No results found for this or any previous visit.  Results for orders placed during the hospital encounter of 09/28/23  CT RENAL STONE STUDY  Narrative CLINICAL DATA:  Nausea, known right-sided kidney stone, abdominal pain  EXAM: CT ABDOMEN AND PELVIS WITHOUT CONTRAST  TECHNIQUE: Multidetector CT imaging of the abdomen and pelvis was performed following the standard protocol without IV contrast.  RADIATION DOSE REDUCTION: This exam was performed according to the departmental dose-optimization program which includes automated exposure control, adjustment of the mA and/or kV according to patient size and/or use of iterative reconstruction technique.  COMPARISON:  04/03/2023  FINDINGS: Lower chest: No acute pleural or parenchymal lung disease.  Hepatobiliary: Cholecystectomy. Unremarkable unenhanced appearance of the liver. No biliary duct dilation.  Pancreas: Unremarkable unenhanced appearance.  Spleen: Unremarkable unenhanced appearance.  Adrenals/Urinary Tract: There is an obstructing 17 mm right UPJ calculus, reference image 42/2. Moderate right-sided hydronephrosis, with significant right renal edema and perinephric fat stranding also noted.  The left kidney is unremarkable. The adrenals and bladder are normal.  Stomach/Bowel: No bowel obstruction or ileus. Normal appendix right mid abdomen. Scattered distal colonic diverticulosis without diverticulitis. No bowel wall thickening or inflammatory  change.  Vascular/Lymphatic: Aortic atherosclerosis. No enlarged abdominal or pelvic lymph nodes.  Reproductive: Status post hysterectomy. No adnexal masses.  Other: No free fluid or free intraperitoneal gas. No abdominal wall hernia.  Musculoskeletal: No acute or destructive bony abnormalities. Reconstructed images demonstrate no additional findings.  IMPRESSION: 1. Obstructing 17 mm right UPJ calculus, with moderate right hydronephrosis and significant right perinephric fat stranding. 2.  Aortic Atherosclerosis (ICD10-I70.0).   Electronically Signed By: Ozell Daring M.D. On: 09/28/2023 21:11   Assessment & Plan:    1. Kidney stones (Primary) -followup 3 months with a renal US  -dietary handout given - Urinalysis, Routine w reflex microscopic   No follow-ups on file.  [name], MD  [facility] [place]

## 2023-11-01 LAB — STONE ANALYSIS
Calcium Oxalate Monohydrate: 80 %
Calcium Phosphate (Carbonate): 10 %
Struvite (MgNH4PO4 6H2O): 10 %
Weight Calculi: 135 mg

## 2023-11-14 ENCOUNTER — Encounter: Admitting: Urology

## 2023-12-05 ENCOUNTER — Ambulatory Visit: Admitting: Physician Assistant

## 2023-12-06 ENCOUNTER — Ambulatory Visit (INDEPENDENT_AMBULATORY_CARE_PROVIDER_SITE_OTHER): Admitting: Physician Assistant

## 2023-12-06 ENCOUNTER — Other Ambulatory Visit (INDEPENDENT_AMBULATORY_CARE_PROVIDER_SITE_OTHER): Payer: Self-pay

## 2023-12-06 ENCOUNTER — Other Ambulatory Visit: Payer: Self-pay

## 2023-12-06 ENCOUNTER — Encounter: Payer: Self-pay | Admitting: Physician Assistant

## 2023-12-06 DIAGNOSIS — M25562 Pain in left knee: Secondary | ICD-10-CM

## 2023-12-06 DIAGNOSIS — M25561 Pain in right knee: Secondary | ICD-10-CM

## 2023-12-06 DIAGNOSIS — G8929 Other chronic pain: Secondary | ICD-10-CM | POA: Diagnosis not present

## 2023-12-06 NOTE — Progress Notes (Signed)
 HPI: Norma Sanchez returns today wanting to discuss possible swollen bilateral knee injections.  She states currently that her right knee pain is worse than her left.  She has had no injury to either knee.  She states springtime is worse to the time of year for her knee pain.  Most of the pain is medial aspect of the knee and the bilaterally in the inferior aspect of the patella bilaterally.  Pain is worse with activity no pain with sitting.  Ranks her right knee pain to be 7 out of 10 left knee pain to be 5 out of 10.  Right knee pain is constant though.  Mechanical symptoms positive for giving way both knees.  Uses Voltaren gel on her knees, takes glucosamine and blue MU which she finds most beneficial.   Review of systems: See HPI otherwise negative.  Physical exam:  General Well-developed well-nourished female in no acute distress able to get on and off the exam table on her own.  No assistive device.  Bilateral knees: Good range of motion of both knees no abnormal warmth erythema.  No gross instability valgus varus stressing of either knee.  Tenderness medial joint lines bilaterally.  Radiographs: Left knee: Knee is well located.  Near bone-on-bone medial compartment.  Mild patellofemoral changes.  Lateral joint line space well-maintained periarticular spurring present.  No acute fractures acute findings.  Right knee: 2 views show the knee to be well located.  Near bone-on-bone medial compartment.  Moderate patellofemoral changes.  Lateral compartment well-preserved with periarticular spurring.  No acute fractures acute findings or bony lesions.   Impression: Tricompartmental arthritis bilateral knees  Plan: Discussed with the patient the fact that she has never really received any relief with cortisone or viscosupplementation injections.  Therefore recommended quad strengthening, weight loss, and continued use of over-the-counter medications.  She will follow-up with us  as needed.  Questions were  encouraged and answered at length.

## 2023-12-06 NOTE — Progress Notes (Deleted)
   Procedure Note  Patient: Norma Sanchez             Date of Birth: 1960-10-21           MRN: [id number]             Visit Date: 12/06/2023  Procedures: Visit Diagnoses:  1. Chronic pain of both knees     Large Joint Inj: bilateral knee on 12/06/2023 9:05 AM Indications: pain Details: 22 G 1.5 in needle, anterolateral approach  Arthrogram: No  Outcome: tolerated well, no immediate complications Procedure, treatment alternatives, risks and benefits explained, specific risks discussed. Consent was given by the patient. Immediately prior to procedure a time out was called to verify the correct patient, procedure, equipment, support staff and site/side marked as required. Patient was prepped and draped in the usual sterile fashion.

## 2023-12-08 ENCOUNTER — Other Ambulatory Visit: Payer: Self-pay | Admitting: Internal Medicine

## 2023-12-08 DIAGNOSIS — E039 Hypothyroidism, unspecified: Secondary | ICD-10-CM

## 2023-12-09 ENCOUNTER — Ambulatory Visit

## 2023-12-09 ENCOUNTER — Other Ambulatory Visit (HOSPITAL_COMMUNITY): Payer: Self-pay

## 2023-12-09 VITALS — BP 143/82 | HR 64 | Ht 60.0 in | Wt 230.0 lb

## 2023-12-09 DIAGNOSIS — R7989 Other specified abnormal findings of blood chemistry: Secondary | ICD-10-CM

## 2023-12-09 DIAGNOSIS — N201 Calculus of ureter: Secondary | ICD-10-CM | POA: Diagnosis not present

## 2023-12-09 DIAGNOSIS — N1832 Chronic kidney disease, stage 3b: Secondary | ICD-10-CM | POA: Diagnosis not present

## 2023-12-09 DIAGNOSIS — R8290 Unspecified abnormal findings in urine: Secondary | ICD-10-CM | POA: Diagnosis not present

## 2023-12-09 DIAGNOSIS — E039 Hypothyroidism, unspecified: Secondary | ICD-10-CM

## 2023-12-09 DIAGNOSIS — D72829 Elevated white blood cell count, unspecified: Secondary | ICD-10-CM | POA: Diagnosis not present

## 2023-12-09 DIAGNOSIS — B379 Candidiasis, unspecified: Secondary | ICD-10-CM

## 2023-12-09 DIAGNOSIS — T3695XA Adverse effect of unspecified systemic antibiotic, initial encounter: Secondary | ICD-10-CM

## 2023-12-09 MED ORDER — FLUCONAZOLE 150 MG PO TABS
ORAL_TABLET | ORAL | 5 refills | Status: AC
  Filled 2023-12-09: qty 2, 3d supply, fill #0
  Filled 2024-01-17: qty 2, 3d supply, fill #1
  Filled 2024-02-21 – 2024-03-20 (×3): qty 2, 3d supply, fill #2

## 2023-12-09 NOTE — Progress Notes (Unsigned)
 Established Patient Office Visit  Subjective   Patient ID: Norma Sanchez, female    DOB: 30-Jan-1961  Age: 63 y.o. MRN: [id number]  Chief Complaint  Patient presents with   Medical Management of Chronic Issues    Pt here for a 6 month follow up     HPI Discussed the use of AI scribe software for clinical note transcription with the patient, who gave verbal consent to proceed.  History of Present Illness   Norma Sanchez is a 63 year old female who presents for a six-month follow-up.  Nephrolithiasis - Hospitalized recently for kidney stones, initially discovered in February - Stone doubled in size and was nearly embedded with surrounding tissue - Symptoms included severe pain and vomiting - Required postponement of scheduled finger surgery due to severity of symptoms - Treated with Percocet, which caused an adverse reaction necessitating emergency services - Stone described as unusually large by hospital staff  Knee pain and dysfunction - Long-standing knee problems with persistent symptoms despite prior interventions - Meniscus repair performed 14-15 years ago without symptom relief - Subsequent development of a stress fracture and repeat meniscus tear, requiring additional surgery - Expresses fear of further surgeries due to concern about becoming wheelchair-bound  Trigger finger - Persistent trigger finger with swelling and locking - Requires manual manipulation to release the finger - Condition interferes with daily activities - Scheduled finger surgery was postponed due to kidney stone hospitalization  Recurrent vulvovaginal candidiasis - History of yeast infections, particularly following antibiotic use - Requests Diflucan  to have on hand for potential future episodes  Thyroid  dysfunction - History of thyroid  issues - Due for blood work to recheck thyroid  function - Experienced issues with medication substitutions in the past, specifically with levothyroxine  instead  of Synthroid , which she refused to take      Patient Active Problem List   Diagnosis Date Noted   Stage 3b chronic kidney disease (HCC) 12/14/2023   Leukocytosis 12/14/2023   Right ureteral stone 10/10/2023   Kidney stones 09/29/2023   Complicated UTI (urinary tract infection) 09/29/2023   Dizziness 09/23/2023   Prediabetes 06/07/2023   Candida vaginitis 06/07/2023   Complete right bundle branch block (RBBB) 07/31/2021   Primary hyperparathyroidism 01/30/2021   Arthritis of carpometacarpal (CMC) joint of left thumb 12/23/2020   Vitamin D  deficiency 03/20/2014   Medication management 03/20/2014   Abnormal glucose 12/21/2013   Hot flashes, menopausal 12/21/2013   Chronic cough 02/01/2013   Hypertension    GERD (gastroesophageal reflux disease)    Hypothyroidism    Hyperlipidemia    Bilateral leg and foot pain 07/23/2011   Morbid obesity with BMI of 40.0-44.9, adult (HCC) 07/23/2011    ROS    Objective:     BP (!) 143/82   Pulse 64   Ht 5' (1.524 m)   Wt 230 lb 0.6 oz (104.3 kg)   SpO2 99%   BMI 44.93 kg/m  BP Readings from Last 3 Encounters:  12/09/23 (!) 143/82  10/31/23 113/73  10/10/23 126/79   Wt Readings from Last 3 Encounters:  12/09/23 230 lb 0.6 oz (104.3 kg)  10/10/23 218 lb 0.6 oz (98.9 kg)  10/07/23 218 lb (98.9 kg)      Physical Exam Vitals and nursing note reviewed.  Constitutional:      Appearance: Normal appearance. She is obese.  HENT:     Head: Normocephalic.     Right Ear: Tympanic membrane, ear canal and external ear normal.  Left Ear: Tympanic membrane, ear canal and external ear normal.     Nose: Nose normal.     Mouth/Throat:     Mouth: Mucous membranes are moist.     Pharynx: Oropharynx is clear.  Cardiovascular:     Rate and Rhythm: Normal rate and regular rhythm.  Pulmonary:     Effort: Pulmonary effort is normal.     Breath sounds: Normal breath sounds.  Musculoskeletal:     Cervical back: Normal range of motion and  neck supple.  Skin:    General: Skin is warm and dry.  Neurological:     Mental Status: She is alert and oriented to person, place, and time.  Psychiatric:        Mood and Affect: Mood normal.        Thought Content: Thought content normal.     Last CBC Lab Results  Component Value Date   WBC 11.9 (H) 12/09/2023   HGB 12.2 12/09/2023   HCT 39.0 12/09/2023   MCV 88 12/09/2023   MCH 27.5 12/09/2023   RDW 13.6 12/09/2023   PLT 370 12/09/2023   Last metabolic panel Lab Results  Component Value Date   GLUCOSE 90 12/09/2023   NA 141 12/09/2023   K 5.5 (H) 12/09/2023   CL 106 12/09/2023   CO2 20 12/09/2023   BUN 21 12/09/2023   CREATININE 1.04 (H) 12/09/2023   GFRNONAA 50 (L) 10/01/2023   CALCIUM  11.0 (H) 12/09/2023   PROT 7.6 09/28/2023   ALBUMIN 3.4 (L) 09/28/2023   LABGLOB 2.5 06/07/2023   BILITOT 0.7 09/28/2023   ALKPHOS 119 09/28/2023   AST 17 09/28/2023   ALT 26 09/28/2023   ANIONGAP 8 10/01/2023   Last lipids Lab Results  Component Value Date   CHOL 158 06/07/2023   HDL 56 06/07/2023   LDLCALC 80 06/07/2023   TRIG 123 06/07/2023   CHOLHDL 2.8 06/07/2023   Last hemoglobin A1c Lab Results  Component Value Date   HGBA1C 5.8 (H) 06/07/2023   Last thyroid  functions Lab Results  Component Value Date   TSH 3.930 12/09/2023   Last vitamin D  Lab Results  Component Value Date   VD25OH 61.3 06/07/2023   Last vitamin B12 and Folate Lab Results  Component Value Date   VITAMINB12 404 06/07/2023   FOLATE 7.4 06/07/2023      The 10-year ASCVD risk score (Arnett DK, et al., 2019) is: 7.2%    Assessment & Plan:   Problem List Items Addressed This Visit       Endocrine   Hypothyroidism   History of hypothyroidism secondary to multinodular goiter s/p RAI.  She is currently prescribed Synthroid  137 mcg daily.  Repeat thyroid  studies ordered today.      Relevant Orders   TSH + free T4 (Completed)     Genitourinary   Right ureteral stone   She was  seen by urology last month.  Likely cause of decreased kidney function.      Relevant Medications   fluconazole  (DIFLUCAN ) 150 MG tablet   Stage 3b chronic kidney disease (HCC) - Primary   Recent hospitalization for kidney stone intervention. Kidney function improved post-hospitalization. Recurrent nephrolithiasis noted. - Recheck kidney function with blood work. - Encourage increased water  intake.      Relevant Orders   Basic Metabolic Panel (BMET) (Completed)     Other   Leukocytosis   Recheck CBC       Relevant Medications   fluconazole  (DIFLUCAN ) 150 MG tablet  Other Visit Diagnoses       Elevated ferritin level       Relevant Orders   CBC with Differential/Platelet (Completed)   Fe+TIBC+Fer (Completed)     Abnormal urinalysis       Relevant Medications   fluconazole  (DIFLUCAN ) 150 MG tablet     Antibiotic-induced yeast infection       Relevant Medications   fluconazole  (DIFLUCAN ) 150 MG tablet       Return in about 6 months (around 06/08/2024) for chronic follow-up with PCP.    Leita Longs, FNP

## 2023-12-10 LAB — BASIC METABOLIC PANEL WITH GFR
BUN/Creatinine Ratio: 20 (ref 12–28)
BUN: 21 mg/dL (ref 8–27)
CO2: 20 mmol/L (ref 20–29)
Calcium: 11 mg/dL — ABNORMAL HIGH (ref 8.7–10.3)
Chloride: 106 mmol/L (ref 96–106)
Creatinine, Ser: 1.04 mg/dL — ABNORMAL HIGH (ref 0.57–1.00)
Glucose: 90 mg/dL (ref 70–99)
Potassium: 5.5 mmol/L — ABNORMAL HIGH (ref 3.5–5.2)
Sodium: 141 mmol/L (ref 134–144)
eGFR: 60 mL/min/1.73 (ref >59)

## 2023-12-10 LAB — CBC WITH DIFFERENTIAL/PLATELET
Basophils Absolute: 0.1 x10E3/uL (ref 0.0–0.2)
Basos: 1 %
EOS (ABSOLUTE): 0.4 x10E3/uL (ref 0.0–0.4)
Eos: 4 %
Hematocrit: 39 % (ref 34.0–46.6)
Hemoglobin: 12.2 g/dL (ref 11.1–15.9)
Immature Grans (Abs): 0.1 x10E3/uL (ref 0.0–0.1)
Immature Granulocytes: 1 %
Lymphocytes Absolute: 3.3 x10E3/uL — ABNORMAL HIGH (ref 0.7–3.1)
Lymphs: 28 %
MCH: 27.5 pg (ref 26.6–33.0)
MCHC: 31.3 g/dL — ABNORMAL LOW (ref 31.5–35.7)
MCV: 88 fL (ref 79–97)
Monocytes Absolute: 0.8 x10E3/uL (ref 0.1–0.9)
Monocytes: 7 %
Neutrophils Absolute: 7.1 x10E3/uL — ABNORMAL HIGH (ref 1.4–7.0)
Neutrophils: 59 %
Platelets: 370 x10E3/uL (ref 150–450)
RBC: 4.44 x10E6/uL (ref 3.77–5.28)
RDW: 13.6 % (ref 11.7–15.4)
WBC: 11.9 x10E3/uL — ABNORMAL HIGH (ref 3.4–10.8)

## 2023-12-10 LAB — TSH+FREE T4
Free T4: 1.5 ng/dL (ref 0.82–1.77)
TSH: 3.93 u[IU]/mL (ref 0.450–4.500)

## 2023-12-10 LAB — IRON,TIBC AND FERRITIN PANEL
Ferritin: 24 ng/mL (ref 15–150)
Iron Saturation: 12 % — ABNORMAL LOW (ref 15–55)
Iron: 41 ug/dL (ref 27–139)
Total Iron Binding Capacity: 350 ug/dL (ref 250–450)
UIBC: 309 ug/dL (ref 118–369)

## 2023-12-14 ENCOUNTER — Ambulatory Visit: Payer: Self-pay

## 2023-12-14 DIAGNOSIS — N1832 Chronic kidney disease, stage 3b: Secondary | ICD-10-CM | POA: Insufficient documentation

## 2023-12-14 DIAGNOSIS — D72829 Elevated white blood cell count, unspecified: Secondary | ICD-10-CM | POA: Insufficient documentation

## 2023-12-14 NOTE — Assessment & Plan Note (Signed)
 Recent hospitalization for kidney stone intervention. Kidney function improved post-hospitalization. Recurrent nephrolithiasis noted. - Recheck kidney function with blood work. - Encourage increased water  intake.

## 2023-12-14 NOTE — Assessment & Plan Note (Signed)
 History of hypothyroidism secondary to multinodular goiter s/p RAI.  She is currently prescribed Synthroid 137 mcg daily.  Repeat thyroid studies ordered today.

## 2023-12-14 NOTE — Assessment & Plan Note (Signed)
 Recheck CBC.

## 2023-12-14 NOTE — Assessment & Plan Note (Signed)
 She was seen by urology last month.  Likely cause of decreased kidney function.

## 2023-12-26 ENCOUNTER — Encounter: Payer: Self-pay | Admitting: Radiology

## 2024-01-17 MED FILL — Amiloride HCl Tab 5 MG: ORAL | 90 days supply | Qty: 90 | Fill #1 | Status: AC

## 2024-01-24 ENCOUNTER — Telehealth: Payer: Self-pay | Admitting: Orthopedic Surgery

## 2024-01-24 NOTE — Telephone Encounter (Signed)
 The patient left a message requesting to schedule surgery for her trigger finger. Is it okay to reschedule surgery? Will patient need to be seen first? Please advise.

## 2024-01-25 NOTE — Telephone Encounter (Signed)
 LMOM to cb and schedule appt.

## 2024-01-25 NOTE — Telephone Encounter (Signed)
 Would you please call patient and let her know she will need to be seen again before surgery can be rescheduled, and schedule her a return office visit with Dr. Erwin?

## 2024-01-27 ENCOUNTER — Ambulatory Visit (HOSPITAL_COMMUNITY)
Admission: RE | Admit: 2024-01-27 | Discharge: 2024-01-27 | Disposition: A | Source: Ambulatory Visit | Attending: Urology

## 2024-01-27 DIAGNOSIS — Z0389 Encounter for observation for other suspected diseases and conditions ruled out: Secondary | ICD-10-CM | POA: Diagnosis not present

## 2024-01-27 DIAGNOSIS — N200 Calculus of kidney: Secondary | ICD-10-CM

## 2024-02-03 ENCOUNTER — Encounter: Payer: Self-pay | Admitting: Urology

## 2024-02-03 ENCOUNTER — Ambulatory Visit: Admitting: Urology

## 2024-02-03 VITALS — BP 143/80 | HR 71

## 2024-02-03 DIAGNOSIS — Z09 Encounter for follow-up examination after completed treatment for conditions other than malignant neoplasm: Secondary | ICD-10-CM

## 2024-02-03 DIAGNOSIS — N200 Calculus of kidney: Secondary | ICD-10-CM

## 2024-02-03 DIAGNOSIS — Z87442 Personal history of urinary calculi: Secondary | ICD-10-CM | POA: Diagnosis not present

## 2024-02-03 NOTE — Progress Notes (Unsigned)
 02/03/2024 9:15 AM   Norma Sanchez 06-09-1963 [id number]  Referring provider: Melvenia Manus BRAVO, MD [address] [place],  [place] [address]     HPI: Ms Stemmer is a [phone] here for followup nephrolithiasis.    PMH: Past Medical History:  Diagnosis Date   Bilateral carpal tunnel syndrome    Biliary dyskinesia 12/07/2011   Fatty liver    GERD (gastroesophageal reflux disease)    Hepatitis B    High cholesterol    History of kidney stones    Hypertension    Hypothyroidism    Multinodular goiter    PONV (postoperative nausea and vomiting)    Pre-diabetes    Skin cancer    wrist and eyebrow    Surgical History: Past Surgical History:  Procedure Laterality Date   ABDOMINAL HYSTERECTOMY  8 yrs ago   1 ovary spared   CARPAL TUNNEL RELEASE  yrs ago   both wrists   CATARACT EXTRACTION W/PHACO Right 05/04/2021   Procedure: CATARACT EXTRACTION PHACO AND INTRAOCULAR LENS PLACEMENT (IOC);  Surgeon: Harrie Agent, MD;  Location: [facility];  Service: Ophthalmology;  Laterality: Right;  CDE: 1.08   CATARACT EXTRACTION W/PHACO Left 05/18/2021   Procedure: CATARACT EXTRACTION PHACO AND INTRAOCULAR LENS PLACEMENT (IOC);  Surgeon: Harrie Agent, MD;  Location: [facility];  Service: Ophthalmology;  Laterality: Left;  CDE: 2.63   CHOLECYSTECTOMY  12/28/2011   Procedure: LAPAROSCOPIC CHOLECYSTECTOMY WITH INTRAOPERATIVE CHOLANGIOGRAM;  Surgeon: Krystal JINNY Russell, MD;  Location: [facility];  Service: General;  Laterality: N/A;  Laparoscopic cholecystectomy with attempted cholangiogram   CYSTOSCOPY WITH STENT PLACEMENT Right 09/30/2023   Procedure: CYSTOSCOPY, WITH STENT INSERTION;  Surgeon: Devere Lonni Righter, MD;  Location: [facility];  Service: Urology;  Laterality: Right;   CYSTOSCOPY/RETROGRADE/URETEROSCOPY/STONE EXTRACTION WITH BASKET Right 10/10/2023   Procedure: CYSTOSCOPY, WITH CALCULUS REMOVAL USING BASKET;  Surgeon: Sherrilee Belvie CROME, MD;  Location: [facility];  Service: Urology;   Laterality: Right;   CYSTOSCOPY/URETEROSCOPY/HOLMIUM LASER/STENT PLACEMENT Left 04/07/2023   Procedure: CYSTOSCOPY LEFT /URETEROSCOPY/HOLMIUM LASER/STENT PLACEMENT AND RETROGRADE PYELOGRAM;  Surgeon: Shane Steffan BROCKS, MD;  Location: [facility];  Service: Urology;  Laterality: Left;  60 MINUTE CASE   CYSTOSCOPY/URETEROSCOPY/HOLMIUM LASER/STENT PLACEMENT Right 10/10/2023   Procedure: CYSTOSCOPY/URETEROSCOPY/HOLMIUM LASER/STENT EXCHANGE;  Surgeon: Sherrilee Belvie CROME, MD;  Location: [facility];  Service: Urology;  Laterality: Right;   HOLMIUM LASER APPLICATION Right 10/10/2023   Procedure: HOLMIUM LASER APPLICATION;  Surgeon: Sherrilee Belvie CROME, MD;  Location: [facility];  Service: Urology;  Laterality: Right;   KNEE SURGERY  arthroscopic, 3 yrs ago   right knee twice   radioactive iodine  to thyroid '  04/2011   TONSILLECTOMY  age 63   TUBAL LIGATION  yrs ago    Home Medications:  Allergies as of 02/03/2024       Reactions   Avelox [moxifloxacin Hcl In Nacl] Anaphylaxis, Hives   Thrush, throat might have closed    Percocet [oxycodone -acetaminophen ] Anaphylaxis   Robaxin  [methocarbamol ] Swelling, Other (See Comments)   Tongue swelling, stroke-like symptoms   Semaglutide  Shortness Of Breath, Nausea And Vomiting, Rash   Skin peeling   Prednisone Other (See Comments)   Chest pains   Codeine Hives, Itching, Rash   Hydrochlorothiazide Itching   On hands and feet   Levothyroxine  Rash   Only to generic. Not to brand synthroid .    Other Hives, Itching   All Cillins   Penicillins Hives   Sulfa Antibiotics Itching, Rash   Hands and feet itch  and turn red   Zostavax [zoster Vaccine Live] Other (See Comments)   Redness around injection site/knot size of baseball bat/contracted shingles        Medication List        Accurate as of February 03, 2024  9:15 AM. If you have any questions, ask your nurse or doctor.          aMILoride  5 MG tablet Commonly known as: MIDAMOR  Take 1 tablet (5  mg total) by mouth daily.   aspirin  EC 81 MG tablet Take 81 mg by mouth in the morning. Swallow whole.   aspirin -acetaminophen -caffeine 250-250-65 MG tablet Commonly known as: EXCEDRIN MIGRAINE Take 2 tablets by mouth every 6 (six) hours as needed for headache or migraine (pain).   atorvastatin  10 MG tablet Commonly known as: Lipitor Take 1 tablet (10 mg total) by mouth daily for cholesterol   buPROPion  150 MG 24 hr tablet Commonly known as: WELLBUTRIN  XL Take 1 tablet (150 mg total) by mouth every morning.   candesartan  32 MG tablet Commonly known as: ATACAND  Take 1 tablet (32 mg total) by mouth nightly for blood pressure   cetirizine 10 MG tablet Commonly known as: ZYRTEC Take 10 mg by mouth in the morning.   escitalopram  20 MG tablet Commonly known as: LEXAPRO  Take 1 tablet (20 mg total) by mouth every evening.   esomeprazole  40 MG capsule Commonly known as: NEXIUM  Take 1 capsule (40 mg total) by mouth daily to prevent heartburn & indigestion   fluconazole  150 MG tablet Commonly known as: DIFLUCAN  Take 1 tablet by mouth once. May repeat in 3 days if symptoms fail to fully resolve after first dose.   fluticasone  50 MCG/ACT nasal spray Commonly known as: FLONASE  Place 1 spray into both nostrils daily. What changed: when to take this   HYDROcodone -acetaminophen  5-325 MG tablet Commonly known as: NORCO/VICODIN Take 1 tablet by mouth every 6 (six) hours as needed for moderate pain (pain score 4-6) or severe pain (pain score 7-10).   meclizine  12.5 MG tablet Commonly known as: ANTIVERT  Take 1 tablet (12.5 mg total) by mouth 3 (three) times daily as needed for dizziness.   metFORMIN  500 MG 24 hr tablet Commonly known as: GLUCOPHAGE -XR Take 2 tablets (1,000 mg total) by mouth 2 (two) times daily.   ondansetron  8 MG disintegrating tablet Commonly known as: ZOFRAN -ODT Take 1 tablet (8 mg total) by mouth every 8 (eight) hours as needed for nausea or vomiting.    phentermine  37.5 MG tablet Commonly known as: ADIPEX-P  Take 1 tablet (37.5 mg total) by mouth daily before breakfast.   propranolol  80 MG tablet Commonly known as: INDERAL  Take 1 tablet (80 mg total) by mouth in the morning and in the evening for blood pressure.   Synthroid  137 MCG tablet Generic drug: levothyroxine  TAKE 1 TABLET (137 MCG TOTAL) DAILY BEFORE BREAKFAST FOR HYPOTHYROIDISM   VITAMIN C PO Take 1 tablet by mouth in the morning.        Allergies: Allergies[1]  Family History: Family History  Problem Relation Age of Onset   Stroke Mother    Breast cancer Mother 15   Alzheimer's disease Mother    Stroke Father    Heart attack Father    AAA (abdominal aortic aneurysm) Father        rupture, smoker   Alcoholism Sister    Heart attack Brother 54   CAD Brother    Suicidality Maternal Grandmother    Suicidality Maternal Grandfather  Heart attack Paternal Grandmother        82s    Social History:  reports that she has never smoked. She has never used smokeless tobacco. She reports that she does not drink alcohol and does not use drugs.  ROS: All other review of systems were reviewed and are negative except what is noted above in HPI  Physical Exam: BP (!) 143/80   Pulse 71   Constitutional:  Alert and oriented, No acute distress. HEENT: [place] AT, moist mucus membranes.  Trachea midline, no masses. Cardiovascular: No clubbing, cyanosis, or edema. Respiratory: Normal respiratory effort, no increased work of breathing. GI: Abdomen is soft, nontender, nondistended, no abdominal masses GU: No CVA tenderness.  Lymph: No cervical or inguinal lymphadenopathy. Skin: No rashes, bruises or suspicious lesions. Neurologic: Grossly intact, no focal deficits, moving all 4 extremities. Psychiatric: Normal mood and affect.  Laboratory Data: Lab Results  Component Value Date   WBC 11.9 (H) 12/09/2023   HGB 12.2 12/09/2023   HCT 39.0 12/09/2023   MCV 88 12/09/2023    PLT 370 12/09/2023    Lab Results  Component Value Date   CREATININE 1.04 (H) 12/09/2023    No results found for: PSA  No results found for: TESTOSTERONE  Lab Results  Component Value Date   HGBA1C 5.8 (H) 06/07/2023    Urinalysis    Component Value Date/Time   COLORURINE YELLOW 09/30/2023 0154   APPEARANCEUR Clear 10/31/2023 1342   LABSPEC 1.013 09/30/2023 0154   PHURINE 5.0 09/30/2023 0154   GLUCOSEU Negative 10/31/2023 1342   HGBUR NEGATIVE 09/30/2023 0154   BILIRUBINUR Negative 10/31/2023 1342   KETONESUR 5 (A) 09/30/2023 0154   PROTEINUR Trace 10/31/2023 1342   PROTEINUR 30 (A) 09/30/2023 0154   UROBILINOGEN CANCELED 12/21/2013 1024   NITRITE Negative 10/31/2023 1342   NITRITE NEGATIVE 09/30/2023 0154   LEUKOCYTESUR 1+ (A) 10/31/2023 1342   LEUKOCYTESUR TRACE (A) 09/30/2023 0154    Lab Results  Component Value Date   LABMICR See below: 10/31/2023   WBCUA >30 (A) 10/31/2023   LABEPIT 0-10 10/31/2023   BACTERIA Few (A) 10/31/2023    Pertinent Imaging: *** No results found for this or any previous visit.  No results found for this or any previous visit.  No results found for this or any previous visit.  No results found for this or any previous visit.  Results for orders placed during the hospital encounter of 01/27/24  US  RENAL  Narrative CLINICAL DATA:  Nephrolithiasis  EXAM: RENAL / URINARY TRACT ULTRASOUND COMPLETE  COMPARISON:  09/28/2023  FINDINGS: Right Kidney:  Renal measurements: 10.6 x 4.6 x 5.6 cm = volume: 143.1 mL. Echogenicity within normal limits. No mass or hydronephrosis visualized. No evidence of nephrolithiasis.  Left Kidney:  Renal measurements: 10.4 x 5.4 x 4.4 cm = volume: 130.3 mL. Echogenicity within normal limits. No mass or hydronephrosis visualized. No evidence of nephrolithiasis.  Bladder:  Bladder is decompressed, limiting its evaluation.  Other:  None.  IMPRESSION: 1. Unremarkable appearance  of the kidneys. No evidence of nephrolithiasis or hydronephrosis. 2. Nonvisualization of the bladder due to decompressed state.   Electronically Signed By: Ozell Daring M.D. On: 02/02/2024 20:25  No results found for this or any previous visit.  No results found for this or any previous visit.  Results for orders placed during the hospital encounter of 09/28/23  CT RENAL STONE STUDY  Narrative CLINICAL DATA:  Nausea, known right-sided kidney stone, abdominal pain  EXAM: CT  ABDOMEN AND PELVIS WITHOUT CONTRAST  TECHNIQUE: Multidetector CT imaging of the abdomen and pelvis was performed following the standard protocol without IV contrast.  RADIATION DOSE REDUCTION: This exam was performed according to the departmental dose-optimization program which includes automated exposure control, adjustment of the mA and/or kV according to patient size and/or use of iterative reconstruction technique.  COMPARISON:  04/03/2023  FINDINGS: Lower chest: No acute pleural or parenchymal lung disease.  Hepatobiliary: Cholecystectomy. Unremarkable unenhanced appearance of the liver. No biliary duct dilation.  Pancreas: Unremarkable unenhanced appearance.  Spleen: Unremarkable unenhanced appearance.  Adrenals/Urinary Tract: There is an obstructing 17 mm right UPJ calculus, reference image 42/2. Moderate right-sided hydronephrosis, with significant right renal edema and perinephric fat stranding also noted.  The left kidney is unremarkable. The adrenals and bladder are normal.  Stomach/Bowel: No bowel obstruction or ileus. Normal appendix right mid abdomen. Scattered distal colonic diverticulosis without diverticulitis. No bowel wall thickening or inflammatory change.  Vascular/Lymphatic: Aortic atherosclerosis. No enlarged abdominal or pelvic lymph nodes.  Reproductive: Status post hysterectomy. No adnexal masses.  Other: No free fluid or free intraperitoneal gas. No  abdominal wall hernia.  Musculoskeletal: No acute or destructive bony abnormalities. Reconstructed images demonstrate no additional findings.  IMPRESSION: 1. Obstructing 17 mm right UPJ calculus, with moderate right hydronephrosis and significant right perinephric fat stranding. 2.  Aortic Atherosclerosis (ICD10-I70.0).   Electronically Signed By: Ozell Daring M.D. On: 09/28/2023 21:11   Assessment & Plan:    1. Kidney stones (Primary) Dietary handout given Followup 1 year with renal US    No follow-ups on file.  [name], MD  [facility]      [1]  Allergies Allergen Reactions   Avelox [Moxifloxacin Hcl In Nacl] Anaphylaxis and Hives    Thrush, throat might have closed    Percocet [Oxycodone -Acetaminophen ] Anaphylaxis   Robaxin  [Methocarbamol ] Swelling and Other (See Comments)    Tongue swelling, stroke-like symptoms   Semaglutide  Shortness Of Breath, Nausea And Vomiting and Rash    Skin peeling   Prednisone Other (See Comments)    Chest pains   Codeine Hives, Itching and Rash   Hydrochlorothiazide Itching    On hands and feet   Levothyroxine  Rash    Only to generic. Not to brand synthroid .    Other Hives and Itching    All Cillins   Penicillins Hives   Sulfa Antibiotics Itching and Rash    Hands and feet itch and turn red   Zostavax [Zoster Vaccine Live] Other (See Comments)    Redness around injection site/knot size of baseball bat/contracted shingles

## 2024-02-03 NOTE — Patient Instructions (Signed)

## 2024-02-05 NOTE — Progress Notes (Unsigned)
 Norma Sanchez - 63 y.o. female MRN [id number]  Date of birth: [birth year]  Office Visit Note: Visit Date: 02/06/2024 PCP: Melvenia Manus BRAVO, MD Referred by: Melvenia Manus BRAVO, MD  Subjective: No chief complaint on file.  HPI: Norma Sanchez is a pleasant 63 y.o. female who returns today for follow-up of right long finger trigger digit. She underwent injection in October of last year with temporary relief with recurrence of symptoms a few months later.  She does have a history notable for bilateral carpal tunnel release as well as left trigger thumb release 10 years prior.  She is having notable clicking locking of the right long finger on a regular basis, often require manual correction.  She is overall healthy and active at baseline.  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  Pertinent ROS were reviewed with the patient and found to be negative unless otherwise specified above in HPI.   Visit Reason:right middle trigger finger Duration of symptoms:8-9 months Hand dominance: right Occupation:works in hystology Diabetic: No Smoking: No Heart/Lung History:none Blood Thinners: none  Prior Testing/EMG:none Injections (Date):inj by gil on 12/13/22 that helped a couple months Treatments:none Prior Surgery:Had right CTR and left thumb trigger release  Assessment & Plan: Visit Diagnoses:  1. Trigger middle finger of right hand      Plan: Extensive discussion was had with the patient today regarding her right long finger trigger digit.  We discussed the etiology and pathophysiology of stenosing tenosynovitis.  We discussed conservative versus surgical treatment modalities.  From a conservative standpoint, we discussed activity modification, splinting, therapy and injections.  From a surgical standpoint, we discussed the possibility for trigger digit release as well as all risk and benefits associated.  Given that she has undergone  appropriate conservative treatments, patient is appropriate candidate for right long finger trigger digit release under local anesthesia.  She would prefer this to be done in the office setting which is appropriate.  Risks and benefits of the procedure were discussed, risks including but not limited to infection, bleeding, scarring, stiffness, nerve injury, tendon injury, vascular injury, recurrence of symptoms and need for subsequent operation.  We also discussed the appropriate postoperative protocol and timeframe for return to activities and function.  Patient expressed understanding.    Follow-up: No follow-ups on file.   Meds & Orders: No orders of the defined types were placed in this encounter.  No orders of the defined types were placed in this encounter.    Procedures: No procedures performed      Clinical History: No specialty comments available.  She reports that she has never smoked. She has never used smokeless tobacco.  Recent Labs    06/07/23 1133  HGBA1C 5.8*    Objective:   Vital Signs: There were no vitals taken for this visit.  Physical Exam  Gen: Well-appearing, in no acute distress; non-toxic CV: Regular Rate. Well-perfused. Warm.  Resp: Breathing unlabored on room air; no wheezing. Psych: Fluid speech in conversation; appropriate affect; normal thought process  Ortho Exam Right hand: - Palpable nodule at the A1 pulley of the long finger, associated tenderness - Notable clicking with deep flexion of the long finger, there is evidence of significant locking with deep flexion - Sensation intact distally, hand remains warm well-perfused   Imaging: No results found.  Past Medical/Family/Surgical/Social History: Medications & Allergies reviewed per EMR, new medications updated. Patient Active Problem List   Diagnosis  Date Noted   Stage 3b chronic kidney disease (HCC) 12/14/2023   Leukocytosis 12/14/2023   Right ureteral stone 10/10/2023   Kidney  stones 09/29/2023   Complicated UTI (urinary tract infection) 09/29/2023   Dizziness 09/23/2023   Prediabetes 06/07/2023   Candida vaginitis 06/07/2023   Complete right bundle branch block (RBBB) 07/31/2021   Primary hyperparathyroidism 01/30/2021   Arthritis of carpometacarpal (CMC) joint of left thumb 12/23/2020   Vitamin D  deficiency 03/20/2014   Medication management 03/20/2014   Abnormal glucose 12/21/2013   Hot flashes, menopausal 12/21/2013   Chronic cough 02/01/2013   Hypertension    GERD (gastroesophageal reflux disease)    Hypothyroidism    Hyperlipidemia    Bilateral leg and foot pain 07/23/2011   Morbid obesity with BMI of 40.0-44.9, adult (HCC) 07/23/2011   Past Medical History:  Diagnosis Date   Bilateral carpal tunnel syndrome    Biliary dyskinesia 12/07/2011   Fatty liver    GERD (gastroesophageal reflux disease)    Hepatitis B    High cholesterol    History of kidney stones    Hypertension    Hypothyroidism    Multinodular goiter    PONV (postoperative nausea and vomiting)    Pre-diabetes    Skin cancer    wrist and eyebrow   Family History  Problem Relation Age of Onset   Stroke Mother    Breast cancer Mother 69   Alzheimer's disease Mother    Stroke Father    Heart attack Father    AAA (abdominal aortic aneurysm) Father        rupture, smoker   Alcoholism Sister    Heart attack Brother 43   CAD Brother    Suicidality Maternal Grandmother    Suicidality Maternal Grandfather    Heart attack Paternal Grandmother        109s   Past Surgical History:  Procedure Laterality Date   ABDOMINAL HYSTERECTOMY  8 yrs ago   1 ovary spared   CARPAL TUNNEL RELEASE  yrs ago   both wrists   CATARACT EXTRACTION W/PHACO Right 05/04/2021   Procedure: CATARACT EXTRACTION PHACO AND INTRAOCULAR LENS PLACEMENT (IOC);  Surgeon: Harrie Agent, MD;  Location: [facility];  Service: Ophthalmology;  Laterality: Right;  CDE: 1.08   CATARACT EXTRACTION W/PHACO Left  05/18/2021   Procedure: CATARACT EXTRACTION PHACO AND INTRAOCULAR LENS PLACEMENT (IOC);  Surgeon: Harrie Agent, MD;  Location: [facility];  Service: Ophthalmology;  Laterality: Left;  CDE: 2.63   CHOLECYSTECTOMY  12/28/2011   Procedure: LAPAROSCOPIC CHOLECYSTECTOMY WITH INTRAOPERATIVE CHOLANGIOGRAM;  Surgeon: Krystal JINNY Russell, MD;  Location: [facility];  Service: General;  Laterality: N/A;  Laparoscopic cholecystectomy with attempted cholangiogram   CYSTOSCOPY WITH STENT PLACEMENT Right 09/30/2023   Procedure: CYSTOSCOPY, WITH STENT INSERTION;  Surgeon: Devere Lonni Righter, MD;  Location: [facility];  Service: Urology;  Laterality: Right;   CYSTOSCOPY/RETROGRADE/URETEROSCOPY/STONE EXTRACTION WITH BASKET Right 10/10/2023   Procedure: CYSTOSCOPY, WITH CALCULUS REMOVAL USING BASKET;  Surgeon: Sherrilee Belvie CROME, MD;  Location: [facility];  Service: Urology;  Laterality: Right;   CYSTOSCOPY/URETEROSCOPY/HOLMIUM LASER/STENT PLACEMENT Left 04/07/2023   Procedure: CYSTOSCOPY LEFT /URETEROSCOPY/HOLMIUM LASER/STENT PLACEMENT AND RETROGRADE PYELOGRAM;  Surgeon: Shane Steffan BROCKS, MD;  Location: [facility];  Service: Urology;  Laterality: Left;  60 MINUTE CASE   CYSTOSCOPY/URETEROSCOPY/HOLMIUM LASER/STENT PLACEMENT Right 10/10/2023   Procedure: CYSTOSCOPY/URETEROSCOPY/HOLMIUM LASER/STENT EXCHANGE;  Surgeon: Sherrilee Belvie CROME, MD;  Location: [facility];  Service: Urology;  Laterality: Right;   HOLMIUM LASER APPLICATION Right 10/10/2023  Procedure: HOLMIUM LASER APPLICATION;  Surgeon: Sherrilee Belvie CROME, MD;  Location: [facility];  Service: Urology;  Laterality: Right;   KNEE SURGERY  arthroscopic, 3 yrs ago   right knee twice   radioactive iodine  to thyroid '  04/2011   TONSILLECTOMY  age 4   TUBAL LIGATION  yrs ago   Social History   Occupational History   Not on file  Tobacco Use   Smoking status: Never   Smokeless tobacco: Never  Vaping Use   Vaping status: Never Used  Substance and Sexual Activity   Alcohol use:  No   Drug use: No   Sexual activity: Yes    Partners: Male    Birth control/protection: Surgical    Kenniyah Sasaki Estela) Arlinda, M.D. [facility], Hand Surgery

## 2024-02-06 ENCOUNTER — Ambulatory Visit: Admitting: Orthopedic Surgery

## 2024-02-06 DIAGNOSIS — M65331 Trigger finger, right middle finger: Secondary | ICD-10-CM

## 2024-02-09 ENCOUNTER — Other Ambulatory Visit: Payer: Self-pay

## 2024-02-09 DIAGNOSIS — M65331 Trigger finger, right middle finger: Secondary | ICD-10-CM

## 2024-02-28 ENCOUNTER — Ambulatory Visit (INDEPENDENT_AMBULATORY_CARE_PROVIDER_SITE_OTHER): Admitting: Orthopedic Surgery

## 2024-02-28 DIAGNOSIS — M65331 Trigger finger, right middle finger: Secondary | ICD-10-CM | POA: Diagnosis not present

## 2024-02-28 NOTE — Progress Notes (Signed)
 "  Procedure Note  Patient: Norma Sanchez             Date of Birth: 1960/11/12           MRN: [id number]             Visit Date: 02/28/2024  Procedures: Visit Diagnoses:  1. Trigger middle finger of right hand     NAME: Norma Sanchez MEDICAL RECORD NO: [id number] DATE OF BIRTH: Jan 30, 1961 FACILITY: [facility] LOCATION: [facility] PHYSICIAN: GILDARDO ALDERTON, MD   OPERATIVE REPORT   DATE OF PROCEDURE: 02/28/2024    PREOPERATIVE DIAGNOSIS: Right long trigger digit   POSTOPERATIVE DIAGNOSIS: Right long trigger digit   PROCEDURE: Right long finger trigger digit release   SURGEON:  Gildardo Alderton, M.D.   ASSISTANT: Joesph Dinsmore, OPA   ANESTHESIA:  Local   INTRAVENOUS FLUIDS:  Per anesthesia flow sheet.   ESTIMATED BLOOD LOSS:  Minimal.   COMPLICATIONS:  None.   SPECIMENS:  none   TOURNIQUET TIME:  4 minutes   DISPOSITION:  Stable to PACU.   INDICATIONS: 64 year old female with history of right long finger trigger digit that was refractory to conservative care.  Patient was indicated for right long finger trigger digit release under local anesthesia.  Risks and benefits of surgery were discussed including the risks of infection, bleeding, scarring, stiffness, nerve injury, vascular injury, tendon injury, need for subsequent operation, persistent symptoms.  She voiced understanding of these risks and elected to proceed.  OPERATIVE COURSE: Patient was seen and identified in the preprocedure area and marked appropriately.  Surgical consent had been signed.  Patient was transferred to the procedure room and placed in supine position with the right upper extremity on an arm board.  Right upper extremity was prepped and draped in normal sterile orthopedic fashion.  A surgical pause was performed between the surgeon and staff, all were in agreement as to the patient, procedure, and site of procedure.  Tourniquet was placed and padded appropriately to the right upper arm.  10  cc of 1% lidocaine  plain was utilized around the planned incisional site.    The arm was exsanguinated and the tourniquet was inflated to 250 mmHg.  A 2 cm oblique incision was designed at the base of the long finger.  This incision was carried down to the subcutaneous tissues.  The A1 pulley was identified and sharply divided utilizing a Beaver blade.  Tenotomy scissors were then used to confirm complete release of the A1 pulley.  The proximal portion of the A2 pulley was also released, not exceeding 25% of its substance.  Following tendon sheath incision, traction tenolysis of both FDP and FDS tendons was performed utilizing Ragnell retractors.  Smooth gliding to the tendon surface was noted.  The adherent flexor tenosynovial tissues were identified, sharply divided and sharply excised.    Patient was asked to flex and extend the long finger to confirm no residual clicking or locking.  Tourniquet was subsequently deflated, electrocautery was utilized for hemostasis.  Tourniquet time was 4 minutes.  Copious irrigation was performed.  Wound was closed utilizing 4-0 nylon in horizontal mattress fashion.  Sterile dressings were applied followed by a soft hand wrap.  Patient was subsequently taken to the recovery area in stable condition.  Post-operative plan: The patient will recover and then be discharged home.  The patient will be non weight bearing on the right upper extremity in a soft dressing.   I will see the patient back in  the office in 2 weeks for postoperative followup.  Discharge instructions were provided for appropriate wound care, dressing maintenance and pain control.   Auriel Kist, MD Electronically signed, 02/28/2024     "

## 2024-02-29 ENCOUNTER — Encounter: Admitting: Rehabilitative and Restorative Service Providers"

## 2024-03-02 ENCOUNTER — Other Ambulatory Visit (HOSPITAL_COMMUNITY): Payer: Self-pay

## 2024-03-12 ENCOUNTER — Other Ambulatory Visit: Payer: Self-pay

## 2024-03-12 DIAGNOSIS — I4510 Unspecified right bundle-branch block: Secondary | ICD-10-CM

## 2024-03-12 DIAGNOSIS — K2100 Gastro-esophageal reflux disease with esophagitis, without bleeding: Secondary | ICD-10-CM

## 2024-03-12 NOTE — Therapy (Signed)
 " OUTPATIENT OCCUPATIONAL THERAPY ORTHO EVALUATION AND DISCHARGE NOTE  Patient Name: Norma Sanchez MRN: [id number] DOB:1960/10/08, 64 y.o., female Today's Date: 03/13/2024  REFERRING PROVIDER: Arlinda Buster, MD   END OF SESSION:   Past Medical History:  Diagnosis Date   Bilateral carpal tunnel syndrome    Biliary dyskinesia 12/07/2011   Fatty liver    GERD (gastroesophageal reflux disease)    Hepatitis B    High cholesterol    History of kidney stones    Hypertension    Hypothyroidism    Multinodular goiter    PONV (postoperative nausea and vomiting)    Pre-diabetes    Skin cancer    wrist and eyebrow   Past Surgical History:  Procedure Laterality Date   ABDOMINAL HYSTERECTOMY  8 yrs ago   1 ovary spared   CARPAL TUNNEL RELEASE  yrs ago   both wrists   CATARACT EXTRACTION W/PHACO Right 05/04/2021   Procedure: CATARACT EXTRACTION PHACO AND INTRAOCULAR LENS PLACEMENT (IOC);  Surgeon: Harrie Agent, MD;  Location: [facility];  Service: Ophthalmology;  Laterality: Right;  CDE: 1.08   CATARACT EXTRACTION W/PHACO Left 05/18/2021   Procedure: CATARACT EXTRACTION PHACO AND INTRAOCULAR LENS PLACEMENT (IOC);  Surgeon: Harrie Agent, MD;  Location: [facility];  Service: Ophthalmology;  Laterality: Left;  CDE: 2.63   CHOLECYSTECTOMY  12/28/2011   Procedure: LAPAROSCOPIC CHOLECYSTECTOMY WITH INTRAOPERATIVE CHOLANGIOGRAM;  Surgeon: Krystal JINNY Russell, MD;  Location: [facility];  Service: General;  Laterality: N/A;  Laparoscopic cholecystectomy with attempted cholangiogram   CYSTOSCOPY WITH STENT PLACEMENT Right 09/30/2023   Procedure: CYSTOSCOPY, WITH STENT INSERTION;  Surgeon: Devere Lonni Righter, MD;  Location: [facility];  Service: Urology;  Laterality: Right;   CYSTOSCOPY/RETROGRADE/URETEROSCOPY/STONE EXTRACTION WITH BASKET Right 10/10/2023   Procedure: CYSTOSCOPY, WITH CALCULUS REMOVAL USING BASKET;  Surgeon: Sherrilee Belvie CROME, MD;  Location: [facility];  Service: Urology;  Laterality: Right;    CYSTOSCOPY/URETEROSCOPY/HOLMIUM LASER/STENT PLACEMENT Left 04/07/2023   Procedure: CYSTOSCOPY LEFT /URETEROSCOPY/HOLMIUM LASER/STENT PLACEMENT AND RETROGRADE PYELOGRAM;  Surgeon: Shane Steffan BROCKS, MD;  Location: [facility];  Service: Urology;  Laterality: Left;  60 MINUTE CASE   CYSTOSCOPY/URETEROSCOPY/HOLMIUM LASER/STENT PLACEMENT Right 10/10/2023   Procedure: CYSTOSCOPY/URETEROSCOPY/HOLMIUM LASER/STENT EXCHANGE;  Surgeon: Sherrilee Belvie CROME, MD;  Location: [facility];  Service: Urology;  Laterality: Right;   HOLMIUM LASER APPLICATION Right 10/10/2023   Procedure: HOLMIUM LASER APPLICATION;  Surgeon: Sherrilee Belvie CROME, MD;  Location: [facility];  Service: Urology;  Laterality: Right;   KNEE SURGERY  arthroscopic, 3 yrs ago   right knee twice   radioactive iodine  to thyroid '  04/2011   TONSILLECTOMY  age 65   TUBAL LIGATION  yrs ago   Patient Active Problem List   Diagnosis Date Noted   Stage 3b chronic kidney disease (HCC) 12/14/2023   Leukocytosis 12/14/2023   Right ureteral stone 10/10/2023   Kidney stones 09/29/2023   Complicated UTI (urinary tract infection) 09/29/2023   Dizziness 09/23/2023   Prediabetes 06/07/2023   Candida vaginitis 06/07/2023   Complete right bundle branch block (RBBB) 07/31/2021   Primary hyperparathyroidism 01/30/2021   Arthritis of carpometacarpal (CMC) joint of left thumb 12/23/2020   Vitamin D  deficiency 03/20/2014   Medication management 03/20/2014   Abnormal glucose 12/21/2013   Hot flashes, menopausal 12/21/2013   Chronic cough 02/01/2013   Hypertension    GERD (gastroesophageal reflux disease)    Hypothyroidism    Hyperlipidemia    Bilateral leg and foot pain 07/23/2011   Morbid obesity with BMI of 40.0-44.9, adult (  HCC) 07/23/2011     ONSET DATE:  DOS 02/28/24  REFERRING DIAG: F34.668 (ICD-10-CM) - Trigger middle finger of right hand   THERAPY DIAG:     Localized edema  Muscle weakness (generalized)  Stiffness of right hand, not elsewhere  classified  Rationale for Evaluation and Treatment: Rehabilitation  SUBJECTIVE:   SUBJECTIVE STATEMENT: The patient states hx of triggering and pain in their hand and subsequent surgical release. The patient states having some stiffness, pain, decreased ability to make a fist and perform I/ADLs.     PERTINENT HISTORY: The patient is now approx 2 weeks s/p Rt hand middle finger TFR.  She's had bil CTR and Lt hand TFR as well in the past and did great.   PRECAUTIONS: None relative to this evaluation and episode of care.   RED FLAGS: None   WEIGHT BEARING RESTRICTIONS: Yes: caution with weightbearing for the next 4-6 weeks, recommended less than 5lbs for next 2 weeks with affected hand  PAIN:  Are you having pain? None significant at rest now   FALLS: Has patient fallen in last 6 months? No, not a fall risk  PLOF: Independent with I/ADLs  PATIENT GOALS: To improve motion, function with affected surgical hand  NEXT MD VISIT: PRN    OBJECTIVE MEASURES:   ADLs: Overall ADLs: States decreased ability to grab, hold household objects, pain and difficulty to open containers, perform FMS tasks (manipulate fasteners on clothing).     UPPER EXTREMITY ROM:     A/ROM Right eval  Wrist flexion 63  Wrist extension 60  (Blank rows = not tested)                    Hand A/ROM Right eval  Full Fist Ability (or Gap to Distal Palmar Crease) Makes a near full fist, WFL, minor stiffness  Thumb Opposition  (Kapandji Scale)  9/10  Thumb MCP (0-60)   Thumb IP (0-80)   Index MCP (0-90)   Index PIP (0-100)   Index DIP (0-70)    Long MCP (0-90)  0-80  Long PIP (0-100)  0-85  Long DIP (0-70)  0-55  Ring MCP (0-90)    Ring PIP (0-100)    Ring DIP (0-70)    Little MCP (0-90)    Little PIP (0-100)    Little DIP (0-70)    (Blank rows = not tested)   HAND STRENGTH & FUNCTION: Eval: Observed weakness in affected hand/arm, grossly 3-/5 MMT, but specific gripping and resistance training  contraindicated today. Also at least mild observed coordination impairments with affected hand/arm due to stiffness and soreness. These deficits are expected to improve with HEP and recommendations.    COORDINATION: Eval: Mild observed coordination impairments with surgical hand, as seen by pain,stiffness, etc. Expected to improve with HEP and recommendations.   SENSATION: Eval:  Light touch mildly diminished especially through sx area. Expected to improve with HEP and recommendations.   EDEMA:   Eval:  Mildly swollen in surgical hand today.  Expected to improve with HEP and recommendations.   COGNITION: Eval: Overall cognitive status: WFL for evaluation today   OBSERVATIONS:   Eval: Surgical site is clean and no overt signs of infection, no drainage, signs of dehiscence, etc.  Tenderness and swelling is within normal limits for post-op timeframe.     TODAY'S TREATMENT:  Post-evaluation treatment:   The patient was given safety information for managing post-op wound, including not to soak wound, to keep clean and dry, to  start with gentle scar mobilizations approx 3 days after stitches are removed and if the wound is closed. The patient should replace their wound dressing at least 1x daily, and monitor for signs of infection.  The patient was supplied with compressive gauze to help with swelling as needed.  The patient should contact the surgeon with any concerns immediately.   The patient should also avoid any strong gripping, push, pull, weight bearing or repetitive motion for the next month.  The patient  should not be doing painful activities.   After a month, the patient can progressively return to all light, normal activities. Sports and heavy weight lifting should be withheld for a total of 3 months.   The patient was also educated (explanation and demonstration) on the following home exercise program including tolerable range of motion, gentle passive range of motion, scar care,  progressive desensitization, prevention of soft tissue contractures, etc. The patient states understanding all directions and feels comfortable with doing this at home, self-management, and following up with the surgeon as needed/scheduled.    Trigger Finger Release Exercises    Exercises - Bend and Pull Back Wrist SLOWLY  - 4-6 x daily - 10-15 reps - Tendon Glides  - 3-4 x daily - 5 reps - 3 second hold - Wrist Prayer Stretch  - 3-4 x daily - 3 reps - 15 seconds hold - Full finger stretches   - 3-4 x daily - 3 reps - 15 second hold - Push your knuckles down, pull back hand to feel a stretch  - 3-4 x daily - 3 reps - 15 second hold Patient Education - Scar Massage     PATIENT EDUCATION: Education details: See tx section above for details  Person educated: Patient Education method: Verbal Instruction, Teach back, Handouts  Education comprehension: States and demonstrates understanding   HOME EXERCISE PROGRAM: See tx section above for details    GOALS: Goals reviewed with patient? Yes   SHORT TERM GOALS: (STG required if POC>30 days) Target Date: 03/13/24  1.  Pt will demo/state understanding of initial HEP and therapist recommendations to improve pain levels, improve motions and ability and eventually return to normal activities.   Goal status: MET    ASSESSMENT:  CLINICAL IMPRESSION: Patient is a 64 y.o. female who was seen today for occupational therapy evaluation for swelling, pain, weakness and decreased functional ability following trigger finger release procedure. The patient is appropriate for OT rehab services and benefited from treatment today. The patient got copious education/treatment today for self-care, wound management, exercises and how to transition to normal activities in the next 4-6 weeks. The patient agrees that they can manage these recommendations independently, and should not need to return for follow up visits. The patient should follow up with the  surgeon with any concerns, and could possibly return to therapy, if needed, with a new order.  The patient will discharge therapy treatment after this visit.     PERFORMANCE DEFICITS: in functional skills including ADLs, IADLs, coordination, dexterity, sensation, edema, ROM, strength, pain, fascial restrictions, flexibility, Fine motor control, body mechanics, endurance, decreased knowledge of precautions, wound, and UE functional use, cognitive skills including problem solving and safety awareness, and psychosocial skills including coping strategies, environmental adaptation, and habits.   IMPAIRMENTS: are limiting patient from ADLs, IADLs, rest and sleep, leisure, and social participation.   COMORBIDITIES: may have co-morbidities  that affects occupational performance. Patient will benefit from skilled OT to address above impairments and improve overall function.  MODIFICATION OR ASSISTANCE TO COMPLETE EVALUATION: No modification of tasks or assist necessary to complete an evaluation.  OT OCCUPATIONAL PROFILE AND HISTORY: Problem focused assessment: Including review of records relating to presenting problem.  CLINICAL DECISION MAKING: LOW - limited treatment options, no task modification necessary  REHAB POTENTIAL: Excellent  EVALUATION COMPLEXITY: Low      PLAN:  OT FREQUENCY: one time visit  OT DURATION: 1 sessions  PLANNED INTERVENTIONS: self care/ADL training, therapeutic exercise, therapeutic activity, neuromuscular re-education, manual therapy, scar mobilization, passive range of motion, splinting, ultrasound, fluidotherapy, compression bandaging, moist heat, cryotherapy, contrast bath, patient/family education, energy conservation, coping strategies training, and Re-evaluation  RECOMMENDED OTHER SERVICES: none now   CONSULTED AND AGREED WITH PLAN OF CARE: Patient  PLAN FOR NEXT SESSION:   N/A    Melvenia Ada, OTR/L, CHT 03/13/2024, 9:54 AM  "

## 2024-03-13 ENCOUNTER — Other Ambulatory Visit: Payer: Self-pay

## 2024-03-13 ENCOUNTER — Encounter: Payer: Self-pay | Admitting: Rehabilitative and Restorative Service Providers"

## 2024-03-13 ENCOUNTER — Other Ambulatory Visit (HOSPITAL_COMMUNITY): Payer: Self-pay

## 2024-03-13 ENCOUNTER — Ambulatory Visit: Admitting: Rehabilitative and Restorative Service Providers"

## 2024-03-13 ENCOUNTER — Encounter: Payer: Self-pay | Admitting: Pharmacist

## 2024-03-13 ENCOUNTER — Ambulatory Visit: Admitting: Orthopedic Surgery

## 2024-03-13 DIAGNOSIS — Z9889 Other specified postprocedural states: Secondary | ICD-10-CM

## 2024-03-13 DIAGNOSIS — M6281 Muscle weakness (generalized): Secondary | ICD-10-CM

## 2024-03-13 DIAGNOSIS — R600 Localized edema: Secondary | ICD-10-CM | POA: Diagnosis not present

## 2024-03-13 DIAGNOSIS — M25641 Stiffness of right hand, not elsewhere classified: Secondary | ICD-10-CM

## 2024-03-13 MED ORDER — ESOMEPRAZOLE MAGNESIUM 40 MG PO CPDR
40.0000 mg | DELAYED_RELEASE_CAPSULE | Freq: Every day | ORAL | 1 refills | Status: AC
  Filled 2024-03-13 – 2024-03-20 (×2): qty 90, 90d supply, fill #0

## 2024-03-13 MED ORDER — PROPRANOLOL HCL 80 MG PO TABS
80.0000 mg | ORAL_TABLET | Freq: Two times a day (BID) | ORAL | 1 refills | Status: AC
  Filled 2024-03-13 – 2024-03-20 (×2): qty 180, 90d supply, fill #0

## 2024-03-13 NOTE — Progress Notes (Signed)
" ° °  ELODIE PANAMENO - 64 y.o. female MRN [id number]  Date of birth: 04-03-1960  Office Visit Note: Visit Date: 03/13/2024 PCP: Melvenia Manus BRAVO, MD Referred by: Melvenia Manus BRAVO, MD  Subjective:  HPI: Norma Sanchez is a 64 y.o. female who presents today for follow up 2 weeks status post right long finger trigger digit release.  She is doing well overall with digital range of motion, no residual clicking or locking.  Pertinent ROS were reviewed with the patient and found to be negative unless otherwise specified above in HPI.   Assessment & Plan: Visit Diagnoses: No diagnosis found.  Plan: Sutures are removed today. She will be seen by occupational therapy for gentle ROM and progressive strengthening. She will return to me in approximately 4 weeks for clinical recheck.  Follow-up: No follow-ups on file.   Meds & Orders: No orders of the defined types were placed in this encounter.  No orders of the defined types were placed in this encounter.    Procedures: No procedures performed       Objective:   Vital Signs: There were no vitals taken for this visit.  Ortho Exam Right hand: - Well-healing incision at the base of the long finger, skin is well-approximated, no erythema or drainage - Able to perform full digital range of motion without residual clicking or locking - Sensation intact distally, hand is warm well-perfused   Imaging: No results found.   Chiante Peden Afton Alderton, M.D. [facility], Hand Surgery  "

## 2024-03-16 ENCOUNTER — Other Ambulatory Visit: Payer: Self-pay

## 2024-03-20 ENCOUNTER — Other Ambulatory Visit: Payer: Self-pay

## 2024-03-20 ENCOUNTER — Other Ambulatory Visit (HOSPITAL_COMMUNITY): Payer: Self-pay

## 2024-04-10 ENCOUNTER — Encounter: Admitting: Orthopedic Surgery

## 2025-02-08 ENCOUNTER — Ambulatory Visit: Admitting: Urology
